# Patient Record
Sex: Female | Born: 1980
Health system: Southern US, Community
[De-identification: ages and names within clinical notes are randomized; demographics above are authoritative.]

## PROBLEM LIST (undated history)

## (undated) ENCOUNTER — Inpatient Hospital Stay (HOSPITAL_COMMUNITY): Payer: Self-pay

## (undated) DIAGNOSIS — I10 Essential (primary) hypertension: Secondary | ICD-10-CM

## (undated) DIAGNOSIS — R112 Nausea with vomiting, unspecified: Secondary | ICD-10-CM

## (undated) DIAGNOSIS — T7840XA Allergy, unspecified, initial encounter: Secondary | ICD-10-CM

## (undated) DIAGNOSIS — R519 Headache, unspecified: Secondary | ICD-10-CM

## (undated) DIAGNOSIS — E119 Type 2 diabetes mellitus without complications: Secondary | ICD-10-CM

## (undated) DIAGNOSIS — R87619 Unspecified abnormal cytological findings in specimens from cervix uteri: Secondary | ICD-10-CM

## (undated) DIAGNOSIS — R05 Cough: Secondary | ICD-10-CM

## (undated) DIAGNOSIS — G43909 Migraine, unspecified, not intractable, without status migrainosus: Secondary | ICD-10-CM

## (undated) DIAGNOSIS — R51 Headache: Principal | ICD-10-CM

## (undated) DIAGNOSIS — Z794 Long term (current) use of insulin: Secondary | ICD-10-CM

## (undated) DIAGNOSIS — E785 Hyperlipidemia, unspecified: Secondary | ICD-10-CM

## (undated) DIAGNOSIS — Z9889 Other specified postprocedural states: Secondary | ICD-10-CM

## (undated) DIAGNOSIS — N809 Endometriosis, unspecified: Secondary | ICD-10-CM

## (undated) DIAGNOSIS — Z8349 Family history of other endocrine, nutritional and metabolic diseases: Secondary | ICD-10-CM

## (undated) DIAGNOSIS — F419 Anxiety disorder, unspecified: Secondary | ICD-10-CM

## (undated) DIAGNOSIS — L301 Dyshidrosis [pompholyx]: Secondary | ICD-10-CM

## (undated) DIAGNOSIS — O139 Gestational [pregnancy-induced] hypertension without significant proteinuria, unspecified trimester: Secondary | ICD-10-CM

## (undated) DIAGNOSIS — IMO0002 Reserved for concepts with insufficient information to code with codable children: Secondary | ICD-10-CM

## (undated) HISTORY — PX: TUBAL LIGATION: SHX77

## (undated) HISTORY — PX: ENDOMETRIAL BIOPSY: SHX622

## (undated) HISTORY — PX: COLPOSCOPY: SHX161

## (undated) HISTORY — PX: CHOLECYSTECTOMY: SHX55

## (undated) HISTORY — PX: OOPHORECTOMY: SHX86

## (undated) HISTORY — DX: Type 2 diabetes mellitus without complications: E11.9

## (undated) HISTORY — PX: SALPINGECTOMY: SHX328

## (undated) HISTORY — PX: TYMPANOSTOMY TUBE PLACEMENT: SHX32

## (undated) HISTORY — DX: Headache: R51

## (undated) HISTORY — DX: Allergy, unspecified, initial encounter: T78.40XA

## (undated) HISTORY — DX: Family history of other endocrine, nutritional and metabolic diseases: Z83.49

## (undated) HISTORY — PX: TONSILLECTOMY: SHX5217

## (undated) HISTORY — PX: ABDOMINAL HYSTERECTOMY: SHX81

## (undated) HISTORY — DX: Hyperlipidemia, unspecified: E78.5

## (undated) HISTORY — PX: TONSILLECTOMY: SUR1361

## (undated) HISTORY — DX: Essential (primary) hypertension: I10

## (undated) HISTORY — DX: Headache, unspecified: R51.9

---

## 2004-04-12 ENCOUNTER — Emergency Department: Payer: Self-pay | Admitting: Emergency Medicine

## 2004-11-14 ENCOUNTER — Emergency Department: Payer: Self-pay | Admitting: Emergency Medicine

## 2004-12-15 ENCOUNTER — Ambulatory Visit: Payer: Self-pay | Admitting: Internal Medicine

## 2005-01-19 ENCOUNTER — Ambulatory Visit: Payer: Self-pay | Admitting: Internal Medicine

## 2005-05-22 ENCOUNTER — Ambulatory Visit: Payer: Self-pay | Admitting: Internal Medicine

## 2005-06-28 ENCOUNTER — Ambulatory Visit: Payer: Self-pay | Admitting: Internal Medicine

## 2005-08-01 ENCOUNTER — Emergency Department: Payer: Self-pay | Admitting: Emergency Medicine

## 2005-10-10 ENCOUNTER — Encounter (INDEPENDENT_AMBULATORY_CARE_PROVIDER_SITE_OTHER): Payer: Self-pay | Admitting: *Deleted

## 2005-10-10 ENCOUNTER — Other Ambulatory Visit: Admission: RE | Admit: 2005-10-10 | Discharge: 2005-10-10 | Payer: Self-pay | Admitting: Family Medicine

## 2005-10-10 ENCOUNTER — Ambulatory Visit: Payer: Self-pay | Admitting: Family Medicine

## 2005-10-27 ENCOUNTER — Ambulatory Visit: Payer: Self-pay | Admitting: Emergency Medicine

## 2005-11-22 ENCOUNTER — Ambulatory Visit: Payer: Self-pay | Admitting: Internal Medicine

## 2005-12-15 ENCOUNTER — Ambulatory Visit: Payer: Self-pay | Admitting: Internal Medicine

## 2006-02-21 ENCOUNTER — Ambulatory Visit: Payer: Self-pay | Admitting: Internal Medicine

## 2006-03-13 ENCOUNTER — Ambulatory Visit: Payer: Self-pay | Admitting: Professional

## 2006-04-17 DIAGNOSIS — O139 Gestational [pregnancy-induced] hypertension without significant proteinuria, unspecified trimester: Secondary | ICD-10-CM

## 2006-04-17 HISTORY — DX: Gestational (pregnancy-induced) hypertension without significant proteinuria, unspecified trimester: O13.9

## 2006-04-19 ENCOUNTER — Ambulatory Visit: Payer: Self-pay | Admitting: Family Medicine

## 2006-05-02 ENCOUNTER — Ambulatory Visit (HOSPITAL_COMMUNITY): Admission: RE | Admit: 2006-05-02 | Discharge: 2006-05-02 | Payer: Self-pay | Admitting: Family Medicine

## 2006-05-23 ENCOUNTER — Ambulatory Visit: Payer: Self-pay | Admitting: Obstetrics & Gynecology

## 2006-06-01 ENCOUNTER — Ambulatory Visit: Payer: Self-pay | Admitting: Gynecology

## 2006-06-06 ENCOUNTER — Ambulatory Visit (HOSPITAL_COMMUNITY): Admission: RE | Admit: 2006-06-06 | Discharge: 2006-06-06 | Payer: Self-pay | Admitting: Family Medicine

## 2006-06-25 ENCOUNTER — Ambulatory Visit: Payer: Self-pay | Admitting: Gynecology

## 2006-07-09 ENCOUNTER — Ambulatory Visit: Payer: Self-pay | Admitting: Gynecology

## 2006-07-12 ENCOUNTER — Ambulatory Visit (HOSPITAL_COMMUNITY): Admission: RE | Admit: 2006-07-12 | Discharge: 2006-07-12 | Payer: Self-pay | Admitting: Family Medicine

## 2006-07-24 ENCOUNTER — Ambulatory Visit: Payer: Self-pay | Admitting: Family Medicine

## 2006-07-25 ENCOUNTER — Ambulatory Visit (HOSPITAL_COMMUNITY): Admission: RE | Admit: 2006-07-25 | Discharge: 2006-07-25 | Payer: Self-pay | Admitting: Family Medicine

## 2006-08-13 ENCOUNTER — Ambulatory Visit: Payer: Self-pay | Admitting: Gynecology

## 2006-08-29 ENCOUNTER — Ambulatory Visit (HOSPITAL_COMMUNITY): Admission: RE | Admit: 2006-08-29 | Discharge: 2006-08-29 | Payer: Self-pay | Admitting: Family Medicine

## 2006-09-03 ENCOUNTER — Ambulatory Visit: Payer: Self-pay | Admitting: Gynecology

## 2006-09-26 ENCOUNTER — Ambulatory Visit (HOSPITAL_COMMUNITY): Admission: RE | Admit: 2006-09-26 | Discharge: 2006-09-26 | Payer: Self-pay | Admitting: Family Medicine

## 2006-09-27 ENCOUNTER — Ambulatory Visit: Payer: Self-pay | Admitting: Obstetrics & Gynecology

## 2006-10-01 ENCOUNTER — Ambulatory Visit: Payer: Self-pay | Admitting: Gynecology

## 2006-10-09 ENCOUNTER — Ambulatory Visit: Payer: Self-pay | Admitting: Family Medicine

## 2006-10-23 ENCOUNTER — Ambulatory Visit (HOSPITAL_COMMUNITY): Admission: RE | Admit: 2006-10-23 | Discharge: 2006-10-23 | Payer: Self-pay | Admitting: Family Medicine

## 2006-10-23 ENCOUNTER — Ambulatory Visit: Payer: Self-pay | Admitting: Family Medicine

## 2006-11-06 ENCOUNTER — Ambulatory Visit: Payer: Self-pay | Admitting: Family Medicine

## 2006-11-09 ENCOUNTER — Ambulatory Visit (HOSPITAL_COMMUNITY): Admission: RE | Admit: 2006-11-09 | Discharge: 2006-11-09 | Payer: Self-pay | Admitting: Family Medicine

## 2006-11-13 ENCOUNTER — Ambulatory Visit: Payer: Self-pay | Admitting: Family Medicine

## 2006-11-16 ENCOUNTER — Inpatient Hospital Stay (HOSPITAL_COMMUNITY): Admission: AD | Admit: 2006-11-16 | Discharge: 2006-11-16 | Payer: Self-pay | Admitting: Family Medicine

## 2006-11-16 ENCOUNTER — Ambulatory Visit: Payer: Self-pay | Admitting: Obstetrics and Gynecology

## 2006-11-19 ENCOUNTER — Ambulatory Visit: Payer: Self-pay | Admitting: Gynecology

## 2006-11-23 ENCOUNTER — Ambulatory Visit: Payer: Self-pay | Admitting: Gynecology

## 2006-11-26 ENCOUNTER — Ambulatory Visit: Payer: Self-pay | Admitting: Gynecology

## 2006-11-26 ENCOUNTER — Inpatient Hospital Stay (HOSPITAL_COMMUNITY): Admission: AD | Admit: 2006-11-26 | Discharge: 2006-11-28 | Payer: Self-pay | Admitting: Obstetrics & Gynecology

## 2006-11-30 ENCOUNTER — Ambulatory Visit: Payer: Self-pay | Admitting: Gynecology

## 2006-12-03 ENCOUNTER — Ambulatory Visit: Payer: Self-pay | Admitting: Gynecology

## 2006-12-07 ENCOUNTER — Ambulatory Visit: Payer: Self-pay | Admitting: Obstetrics and Gynecology

## 2006-12-10 ENCOUNTER — Ambulatory Visit: Payer: Self-pay | Admitting: Gynecology

## 2006-12-11 ENCOUNTER — Ambulatory Visit: Payer: Self-pay | Admitting: Family Medicine

## 2006-12-11 ENCOUNTER — Inpatient Hospital Stay (HOSPITAL_COMMUNITY): Admission: AD | Admit: 2006-12-11 | Discharge: 2006-12-17 | Payer: Self-pay | Admitting: Obstetrics & Gynecology

## 2006-12-12 ENCOUNTER — Encounter: Payer: Self-pay | Admitting: Obstetrics & Gynecology

## 2006-12-12 ENCOUNTER — Ambulatory Visit: Payer: Self-pay | Admitting: Obstetrics & Gynecology

## 2006-12-18 ENCOUNTER — Ambulatory Visit: Payer: Self-pay | Admitting: Obstetrics & Gynecology

## 2006-12-18 ENCOUNTER — Encounter: Admission: RE | Admit: 2006-12-18 | Discharge: 2007-01-16 | Payer: Self-pay | Admitting: Gynecology

## 2006-12-24 ENCOUNTER — Ambulatory Visit: Payer: Self-pay | Admitting: Obstetrics & Gynecology

## 2007-01-07 ENCOUNTER — Ambulatory Visit: Payer: Self-pay | Admitting: Gynecology

## 2007-01-07 ENCOUNTER — Encounter (INDEPENDENT_AMBULATORY_CARE_PROVIDER_SITE_OTHER): Payer: Self-pay | Admitting: Gynecology

## 2007-01-14 ENCOUNTER — Ambulatory Visit: Payer: Self-pay | Admitting: Gynecology

## 2007-01-17 ENCOUNTER — Encounter: Admission: RE | Admit: 2007-01-17 | Discharge: 2007-01-21 | Payer: Self-pay | Admitting: Gynecology

## 2007-02-05 ENCOUNTER — Ambulatory Visit: Payer: Self-pay | Admitting: Gynecology

## 2007-02-07 ENCOUNTER — Ambulatory Visit: Payer: Self-pay | Admitting: Obstetrics & Gynecology

## 2007-02-19 ENCOUNTER — Ambulatory Visit: Payer: Self-pay | Admitting: Internal Medicine

## 2007-02-19 DIAGNOSIS — E7849 Other hyperlipidemia: Secondary | ICD-10-CM | POA: Insufficient documentation

## 2007-02-19 DIAGNOSIS — E785 Hyperlipidemia, unspecified: Secondary | ICD-10-CM

## 2007-02-20 LAB — CONVERTED CEMR LAB
Albumin: 4.5 g/dL (ref 3.5–5.2)
BUN: 14 mg/dL (ref 6–23)
CO2: 25 meq/L (ref 19–32)
Calcium: 9.7 mg/dL (ref 8.4–10.5)
Chloride: 99 meq/L (ref 96–112)
Creatinine, Ser: 0.85 mg/dL (ref 0.40–1.20)
Glucose, Bld: 88 mg/dL (ref 70–99)
Phosphorus: 3.9 mg/dL (ref 2.3–4.6)
Potassium: 3.8 meq/L (ref 3.5–5.3)
Sodium: 137 meq/L (ref 135–145)

## 2007-02-28 ENCOUNTER — Telehealth (INDEPENDENT_AMBULATORY_CARE_PROVIDER_SITE_OTHER): Payer: Self-pay | Admitting: *Deleted

## 2007-03-05 ENCOUNTER — Telehealth: Payer: Self-pay | Admitting: Internal Medicine

## 2007-03-07 ENCOUNTER — Ambulatory Visit: Payer: Self-pay | Admitting: Family Medicine

## 2007-03-08 ENCOUNTER — Telehealth (INDEPENDENT_AMBULATORY_CARE_PROVIDER_SITE_OTHER): Payer: Self-pay | Admitting: *Deleted

## 2007-04-02 ENCOUNTER — Ambulatory Visit: Payer: Self-pay | Admitting: Internal Medicine

## 2007-06-28 ENCOUNTER — Ambulatory Visit: Payer: Self-pay | Admitting: Internal Medicine

## 2007-07-18 ENCOUNTER — Ambulatory Visit: Payer: Self-pay | Admitting: Family Medicine

## 2007-07-18 DIAGNOSIS — J309 Allergic rhinitis, unspecified: Secondary | ICD-10-CM | POA: Insufficient documentation

## 2007-08-27 ENCOUNTER — Ambulatory Visit: Payer: Self-pay | Admitting: Family Medicine

## 2007-08-27 ENCOUNTER — Encounter: Payer: Self-pay | Admitting: Family Medicine

## 2007-09-03 ENCOUNTER — Ambulatory Visit: Payer: Self-pay | Admitting: Family Medicine

## 2007-09-24 ENCOUNTER — Ambulatory Visit: Payer: Self-pay | Admitting: Family Medicine

## 2007-11-10 ENCOUNTER — Emergency Department: Payer: Self-pay | Admitting: Emergency Medicine

## 2007-12-11 ENCOUNTER — Ambulatory Visit: Payer: Self-pay | Admitting: Family Medicine

## 2007-12-20 ENCOUNTER — Ambulatory Visit: Payer: Self-pay | Admitting: Internal Medicine

## 2007-12-20 DIAGNOSIS — G4700 Insomnia, unspecified: Secondary | ICD-10-CM | POA: Insufficient documentation

## 2007-12-24 LAB — CONVERTED CEMR LAB
Albumin: 4.4 g/dL (ref 3.5–5.2)
BUN: 15 mg/dL (ref 6–23)
Basophils Absolute: 0 10*3/uL (ref 0.0–0.1)
Basophils Relative: 0 % (ref 0–1)
CO2: 27 meq/L (ref 19–32)
Calcium: 9.9 mg/dL (ref 8.4–10.5)
Chloride: 99 meq/L (ref 96–112)
Creatinine, Ser: 0.76 mg/dL (ref 0.40–1.20)
Eosinophils Absolute: 0.1 10*3/uL (ref 0.0–0.7)
Eosinophils Relative: 1 % (ref 0–5)
Glucose, Bld: 79 mg/dL (ref 70–99)
HCT: 36.8 % (ref 36.0–46.0)
Hemoglobin: 12.5 g/dL (ref 12.0–15.0)
Lymphocytes Relative: 37 % (ref 12–46)
Lymphs Abs: 2.5 10*3/uL (ref 0.7–4.0)
MCHC: 34 g/dL (ref 30.0–36.0)
MCV: 90.2 fL (ref 78.0–100.0)
Monocytes Absolute: 0.5 10*3/uL (ref 0.1–1.0)
Monocytes Relative: 8 % (ref 3–12)
Neutro Abs: 3.7 10*3/uL (ref 1.7–7.7)
Neutrophils Relative %: 54 % (ref 43–77)
Phosphorus: 3.6 mg/dL (ref 2.3–4.6)
Platelets: 429 10*3/uL — ABNORMAL HIGH (ref 150–400)
Potassium: 4.1 meq/L (ref 3.5–5.3)
RBC: 4.08 M/uL (ref 3.87–5.11)
RDW: 12.5 % (ref 11.5–15.5)
Sodium: 138 meq/L (ref 135–145)
TSH: 1.135 microintl units/mL (ref 0.350–4.50)
WBC: 6.8 10*3/uL (ref 4.0–10.5)

## 2008-02-10 ENCOUNTER — Telehealth: Payer: Self-pay | Admitting: Internal Medicine

## 2008-05-01 ENCOUNTER — Other Ambulatory Visit: Admission: RE | Admit: 2008-05-01 | Discharge: 2008-05-01 | Payer: Self-pay | Admitting: Obstetrics & Gynecology

## 2008-07-07 ENCOUNTER — Emergency Department: Payer: Self-pay | Admitting: Emergency Medicine

## 2008-07-09 ENCOUNTER — Ambulatory Visit: Payer: Self-pay | Admitting: Family Medicine

## 2008-07-15 ENCOUNTER — Ambulatory Visit: Payer: Self-pay | Admitting: General Practice

## 2008-07-30 ENCOUNTER — Ambulatory Visit: Payer: Self-pay | Admitting: Obstetrics & Gynecology

## 2009-04-30 ENCOUNTER — Encounter: Admission: RE | Admit: 2009-04-30 | Discharge: 2009-04-30 | Payer: Self-pay | Admitting: Podiatry

## 2009-05-12 ENCOUNTER — Telehealth: Payer: Self-pay | Admitting: Internal Medicine

## 2009-10-20 ENCOUNTER — Ambulatory Visit: Payer: Self-pay | Admitting: Family Medicine

## 2009-11-11 ENCOUNTER — Ambulatory Visit: Payer: Self-pay | Admitting: Obstetrics and Gynecology

## 2010-04-05 ENCOUNTER — Ambulatory Visit: Payer: Self-pay | Admitting: Internal Medicine

## 2010-04-17 HISTORY — PX: LAPAROSCOPIC ASSISTED VAGINAL HYSTERECTOMY: SHX5398
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
## 2010-05-17 NOTE — Progress Notes (Signed)
Summary: Trazodone not working  Phone Note Call from Patient Call back at [phone]   Caller: Patient Call For: Dr. Alphonsus Sias Reason for Call: Lab or Test Results Summary of Call: Pt is calling to let you know that Trazodone is not working to help her sleep.  Pt also has a sore throat, body aches, no fever and wants to know what you suggest for this?  Pharmacy - [facility].  Pt had the swine flu shot last week after her throat became sore. Initial call taken by: Sydell Axon,  February 10, 2008 2:58 PM  Follow-up for Phone Call        trazodone does't kick in for hours Only uses when her shift changes every 2 weeks will try ambien discussed dependence--should be okay if only uses occ  mild cold symptoms back discomfort discussed symptomatic Rx Follow-up by: Cindee Salt MD,  February 10, 2008 5:55 PM    New/Updated Medications: ZOLPIDEM TARTRATE 5 MG TABS (ZOLPIDEM TARTRATE) 1-2 at bedtime as needed for insomnia   Prescriptions: ZOLPIDEM TARTRATE 5 MG TABS (ZOLPIDEM TARTRATE) 1-2 at bedtime as needed for insomnia  #60 x 0   Entered and Authorized by:   Cindee Salt MD   Signed by:   Cindee Salt MD on 02/10/2008   Method used:   Telephoned to ...         RxID:   [id number]

## 2010-05-17 NOTE — Assessment & Plan Note (Signed)
Summary: ST,COUGH/CLE   Vital Signs:  Patient Profile:   30 Years Old Female Weight:      208 pounds Temp:     98.6 degrees F oral Pulse rate:   99 / minute BP sitting:   132 / 96  (left arm) Cuff size:   large  Vitals Entered By: Cooper Render (March 07, 2007 10:32 AM)                 Chief Complaint:  URI sx.  History of Present Illness: Here fosr URI sx, onset x2d with ST, some cough, no nasal congestion, chilols/no fever.  Taking Dayquil/Nyquil--slept.  Hurts to swallow--full  diet.  IN class first of the wk, goes back to 12h shift tonight.  Has a 2 and 1/2 mo old daughter.  Current Allergies (reviewed today): ! * CEFZIL ! * Z PAK Updated/Current Medications (including changes made in today's visit):  MAXZIDE 75-50 MG  TABS (TRIAMTERENE-HCTZ) Take 1 tablet by mouth once a day ATENOLOL 25 MG  TABS (ATENOLOL) Take 1 tablet by mouth once a day * MIRENA       Review of Systems      See HPI   Physical Exam  General:     alert, well-developed, and well-nourished.  NAD Head:     normocephalic, atraumatic, and no abnormalities observed.   Eyes:     pupils equal, pupils round, and no injection.   Ears:     TMs retracted bilat Nose:     eryth/edema, sinuses not tender Mouth:     injected with no exudate Lungs:     moist cough only, normal respiratory effort, no crackles, and no wheezes.   Neurologic:     alert & oriented X3 and gait normal.   Skin:     turgor normal, color normal, and no rashes.   Cervical Nodes:     shotty Psych:     normally interactive.      Impression & Recommendations:  Problem # 1:  URI (ICD-465.9) Assessment: New comfort care measures: increase by mouth fluids, tylenol/IBP pern, rest discussed hygeine, handwashing see back or call if worsens next 5-7d  Complete Medication List: 1)  Maxzide 75-50 Mg Tabs (Triamterene-hctz) .... Take 1 tablet by mouth once a day 2)  Atenolol 25 Mg Tabs (Atenolol) .... Take 1 tablet by  mouth once a day 3)  Mirena      ]

## 2010-05-17 NOTE — Assessment & Plan Note (Signed)
Summary: COUGH/FEVER/HEA   Vital Signs:  Patient Profile:   30 Years Old Female Weight:      210.25 pounds Temp:     97.9 degrees F oral Pulse rate:   80 / minute BP sitting:   130 / 82  (left arm)  Vitals Entered By: Wandra Mannan (April 02, 2007 11:25 AM)                 Chief Complaint:  cough and sinus ddrainage.  History of Present Illness: Sick about 3 weeks ago with sore throat and cough This resolved after a couple of weeks Seemed to be better for about 1 &1/2 weeks but now has recurred did seem to be well inbetween  Bad cough, affecting her voice Started again about 3 days ago generally dry No sig rhinnorhea or drainage but nose is stuffy Throat is sore again Has pressure in her ears No fever, chills some sweats and dizziness  Tried nyquil  Infant daughter watched by Mom--not sick  Current Allergies (reviewed today): ! * CEFZIL ! * Z PAK  Past Medical History:    Reviewed history from 02/19/2007 and no changes required:       Hypertension--2003       Hyperlipidemia  Past Surgical History:    Reviewed history from 02/19/2007 and no changes required:       Ear tubes as child       Tonsillectomy   Social History:    Reviewed history from 02/19/2007 and no changes required:       Returned to work as Scientist, research (life sciences)       Single       1 daughter       Never Smoked       Alcohol use-occ     Physical Exam  General:     alert.  NAD Head:     no sinus tenderness Ears:     R ear normal and L ear normal.   Nose:     mild congestion Mouth:     minimal pharyngeal injection Neck:     supple and no cervical lymphadenopathy.   Lungs:     normal respiratory effort and normal breath sounds.      Impression & Recommendations:  Problem # 1:  URI (ICD-465.9) Assessment: Comment Only seems to have new infection Clearly viral She will call next week if signs of a secondary bacterial infection (discussed this) hycodan to help sleep  and analgesics  The following medications were removed from the medication list:    Tussionex Pennkinetic Er 8-10 Mg/42ml Lqcr (Chlorpheniramine-hydrocodone) .Marland Kitchen... 1 tsp at bedtime as needed cough  Her updated medication list for this problem includes:    Hycodan 5-1.5 Mg/10ml Syrp (Hydrocodone-homatropine) .Marland Kitchen... 1-2 teaspoons at bedtime as needed for severe cough   Complete Medication List: 1)  Maxzide 75-50 Mg Tabs (Triamterene-hctz) .... Take 1 tablet by mouth once a day 2)  Atenolol 25 Mg Tabs (Atenolol) .... Take 1 tablet by mouth once a day 3)  Mirena  4)  Hycodan 5-1.5 Mg/77ml Syrp (Hydrocodone-homatropine) .Marland Kitchen.. 1-2 teaspoons at bedtime as needed for severe cough   Patient Instructions: 1)  Please schedule a follow-up appointment as needed. 2)  Take 650-1000mg  of Tylenol every 4-6 hours as needed for relief of pain or comfort of fever AVOID taking more than 4000mg   in a 24 hour period (can cause liver damage in higher doses). 3)  Take 400-600mg  of Ibuprofen (Advil, Motrin) with food  every 4-6 hours as needed for relief of pain or comfort of fever.    Prescriptions: HYCODAN 5-1.5 MG/5ML  SYRP (HYDROCODONE-HOMATROPINE) 1-2 teaspoons at bedtime as needed for severe cough  #4 oz x 0   Entered and Authorized by:   Cindee Salt MD   Signed by:   Cindee Salt MD on 04/02/2007   Method used:   Print then Give to Patient   RxID:   [id number]  ] Current Allergies (reviewed today): ! * CEFZIL ! * Z PAK Current Medications (including changes made in today's visit):  MAXZIDE 75-50 MG  TABS (TRIAMTERENE-HCTZ) Take 1 tablet by mouth once a day ATENOLOL 25 MG  TABS (ATENOLOL) Take 1 tablet by mouth once a day * MIRENA  HYCODAN 5-1.5 MG/5ML  SYRP (HYDROCODONE-HOMATROPINE) 1-2 teaspoons at bedtime as needed for severe cough

## 2010-05-17 NOTE — Assessment & Plan Note (Signed)
Summary: sinus infection/dlo   Vital Signs:  Patient Profile:   30 Years Old Female Weight:      216 pounds Temp:     98.3 degrees F oral Pulse rate:   98 / minute BP sitting:   126 / 91  (left arm) Cuff size:   large  Vitals Entered By: Cooper Render (July 18, 2007 2:29 PM)                 Chief Complaint:  ? sinus infection.  History of Present Illness: Here for URI--ears full, nasal congestion, post nasal drainage, no fever/chills.  Taking nyquil and dayquil--help some.   Daughter 13mo--teething and URI    Current Allergies (reviewed today): ! * CEFZIL ! * Z PAK     Review of Systems      See HPI   Physical Exam  General:     alert, well-developed, well-nourished, and well-hydrated.  NAD Eyes:     pupils equal and pupils round.   Ears:     TMs retracted with increased fluid bilat Nose:     mucosal erythema and boggy .  sinuses +,- Mouth:     no exudates and pharyngeal erythema.   Lungs:     moist cough only, no crackles and no wheezes.   Neurologic:     alert & oriented X3, sensation intact to light touch, and gait normal.   Skin:     turgor normal, color normal, and no rashes.   Cervical Nodes:     shotty tonsilar nodes, no anterior cervical adenopathy and no posterior cervical adenopathy.   Psych:     normally interactive and good eye contact.      Impression & Recommendations:  Problem # 1:  ALLERGIC RHINITIS (ICD-477.9) Assessment: New will start Nasacort nasal spray 2 once daily--with demo see back if not improved Her updated medication list for this problem includes:    Clarinex 5 Mg Tabs (Desloratadine) .Marland Kitchen... 1 once daily for congestion by mouth    Nasacort Aq 55 Mcg/act Aers (Triamcinolone acetonide(nasal)) .Marland Kitchen... 1-2 sprays each nostril daily   Problem # 2:  URI (ICD-465.9) Assessment: New continue comfort care measures: increase po fluids, rest, tylenol or IBP as needed start clarines see back if not improved 1 wk Her updated  medication list for this problem includes:    Clarinex 5 Mg Tabs (Desloratadine) .Marland Kitchen... 1 once daily for congestion by mouth   Complete Medication List: 1)  Maxzide 75-50 Mg Tabs (Triamterene-hctz) .... Take 1 tablet by mouth once a day 2)  Atenolol 25 Mg Tabs (Atenolol) .... Take 1 tablet by mouth once a day 3)  Mirena  4)  Clarinex 5 Mg Tabs (Desloratadine) .Marland Kitchen.. 1 once daily for congestion by mouth 5)  Nasacort Aq 55 Mcg/act Aers (Triamcinolone acetonide(nasal)) .Marland Kitchen.. 1-2 sprays each nostril daily     Prescriptions: CLARINEX 5 MG  TABS (DESLORATADINE) 1 once daily for congestion by mouth Brand medically necessary #5 x 0   Entered and Authorized by:   Gildardo Griffes FNP   Signed by:   Gildardo Griffes FNP on 07/18/2007   Method used:   Print then Give to Patient   RxID:   [id number]  ] Prior Medications (reviewed today): MAXZIDE 75-50 MG  TABS (TRIAMTERENE-HCTZ) Take 1 tablet by mouth once a day ATENOLOL 25 MG  TABS (ATENOLOL) Take 1 tablet by mouth once a day MIRENA ()  Current Allergies (reviewed today): ! * CEFZIL ! *  Z PAK

## 2010-05-17 NOTE — Progress Notes (Signed)
Summary: cough  Phone Note Call from Patient Call back at [phone]   Caller: Patient Call For: letvak/bean Summary of Call: pt seen for s/t she is requesting rx for cough, she started coughing last night and could hardly sleep, cough is making her s/t worse. Initial call taken by: Liane Comber,  March 08, 2007 9:22 AM  Follow-up for Phone Call        Rx for tussinex to your desk  ..................................................................Marland KitchenGildardo Griffes FNP  March 08, 2007 12:54 PM   Additional Follow-up for Phone Call Additional follow up Details #1::        Advised patient.  ......................................................Marland KitchenLiane Comber March 08, 2007 1:53 PM Rx called to pharmacy. ................................................Marland KitchenLiane Comber March 08, 2007 1:53 PM     New/Updated Medications: [name] ER 8-10 MG/5ML  LQCR (CHLORPHENIRAMINE-HYDROCODONE) 1 tsp at bedtime as needed cough [BMN]   Prescriptions: TUSSIONEX PENNKINETIC ER 8-10 MG/5ML  LQCR (CHLORPHENIRAMINE-HYDROCODONE) 1 tsp at bedtime as needed cough Brand medically necessary #120 mg x 0   Entered and Authorized by:   Gildardo Griffes FNP   Signed by:   Gildardo Griffes FNP on 03/08/2007   Method used:   Print then Give to Patient   RxID:   [id number]

## 2010-05-17 NOTE — Progress Notes (Signed)
Summary: release to work?  Phone Note Call from Patient Call back at [phone]   Caller: Patient Call For: letvak Summary of Call: pt had a baby 3 months ago and was taken out of work during pregnancy from pre eclampsia, her ob dr had her on light duty when she returned due to her bp, at her last visit her bp was ok her employer is wanting to know when she can be released from light duty. Initial call taken by: Liane Comber,  February 28, 2007 10:12 AM  Follow-up for Phone Call        she can resume full activities as of now prepare note if needed please Follow-up by: Cindee Salt MD,  February 28, 2007 10:44 AM  Additional Follow-up for Phone Call Additional follow up Details #1::        notw written as above, stamped and faxed to her ..................................................................Marland KitchenLiane Comber  February 28, 2007 11:40 AM

## 2010-05-17 NOTE — Assessment & Plan Note (Signed)
Summary: F/U/HEA   Vital Signs:  Patient Profile:   30 Years Old Female Weight:      216.38 pounds Temp:     97.8 degrees F oral Pulse rate:   80 / minute BP sitting:   134 / 88  (left arm)  Vitals Entered By: Wandra Mannan (June 28, 2007 11:10 AM)                 Chief Complaint:  follow up.  History of Present Illness: Doing okay with BP meds Doesn't check BP outside of office No headaches no vision problems No apparent problems with the medication Did have BP problems even before her pregnancy   Serial Vital Signs/Assessments:  Time      Position  BP       Pulse  Resp  Temp     By           R Arm     134/86                         Cindee Salt MD    Current Allergies (reviewed today): ! * CEFZIL ! * Z PAK  Past Medical History:    Reviewed history from 02/19/2007 and no changes required:       Hypertension--2003       Hyperlipidemia  Past Surgical History:    Reviewed history from 02/19/2007 and no changes required:       Ear tubes as child       Tonsillectomy   Social History:    Reviewed history from 02/19/2007 and no changes required:       Returned to work as Scientist, research (life sciences)       Single       1 daughter       Never Smoked       Alcohol use-occ    Review of Systems  The patient denies chest pain, syncope, and dyspnea on exhertion.         sleep is variable--relates to shift work for [employer] office--changes shift every 2 weeks   Physical Exam  General:     alert and normal appearance.   Neck:     supple, no masses, no thyromegaly, no carotid bruits, and no cervical lymphadenopathy.   Lungs:     normal respiratory effort and normal breath sounds.   Heart:     normal rate, regular rhythm, no murmur, and no gallop.   Pulses:     normal in feet Extremities:     no edema or calf tenderness Psych:     normally interactive, good eye contact, not anxious appearing, and not depressed appearing.      Impression &  Recommendations:  Problem # 1:  HYPERTENSION (ICD-401.9) Assessment: Unchanged acceptable control Needs to stay on meds  Her updated medication list for this problem includes:    Maxzide 75-50 Mg Tabs (Triamterene-hctz) .Marland Kitchen... Take 1 tablet by mouth once a day    Atenolol 25 Mg Tabs (Atenolol) .Marland Kitchen... Take 1 tablet by mouth once a day   Complete Medication List: 1)  Maxzide 75-50 Mg Tabs (Triamterene-hctz) .... Take 1 tablet by mouth once a day 2)  Atenolol 25 Mg Tabs (Atenolol) .... Take 1 tablet by mouth once a day 3)  Mirena    Patient Instructions: 1)  Please schedule a follow-up appointment in 6 months.    ] Current Allergies (reviewed today): ! * CEFZIL ! *  Z PAK Current Medications (including changes made in today's visit):  MAXZIDE 75-50 MG  TABS (TRIAMTERENE-HCTZ) Take 1 tablet by mouth once a day ATENOLOL 25 MG  TABS (ATENOLOL) Take 1 tablet by mouth once a day * MIRENA

## 2010-05-17 NOTE — Progress Notes (Signed)
Summary: Rx Atenolol  Phone Note Refill Request Call back at [phone] Message from:  [facility] on May 12, 2009 10:46 AM  Refills Requested: Medication #1:  ATENOLOL 25 MG  TABS Take 1 tablet by mouth once a day   Last Refilled: 02/02/2009 Received faxed refill request, patient hasn't been seen since 2009 and no appt scheduled.    Method Requested: Electronic Initial call taken by: Linde Gillis CMA [facility]),  May 12, 2009 10:47 AM  Follow-up for Phone Call        Called patient and left message on machine to return my call. DeShannon Katrinka Blazing CMA [facility]ull)  May 12, 2009 12:11 PM     Prescriptions: ATENOLOL 25 MG  TABS (ATENOLOL) Take 1 tablet by mouth once a day  #30 x 6   Entered by:   Mervin Hack CMA (AAMA)   Authorized by:   Cindee Salt MD   Signed by:   Mervin Hack CMA (AAMA) on 05/12/2009   Method used:   Electronically to        [facility]* (retail)       [address] [place], [place]  [address]       Ph: [phone]       Fax: [phone]   RxID:   [id number]
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
## 2010-05-19 NOTE — Assessment & Plan Note (Signed)
Summary: SINUS SYMPTOMS- 12:45/ lb   Vital Signs:  Patient profile:   30 year old female Height:      66 inches Weight:      198 pounds BMI:     32.07 Temp:     98.0 degrees F oral Pulse rate:   71 / minute Pulse rhythm:   regular BP sitting:   145 / 104  (left arm) Cuff size:   large  Vitals Entered By: Mervin Hack CMA Duncan Dull) (April 05, 2010 12:58 PM)   History of Present Illness: Got sick a couple of weeks ago Seemed to run its course then it went to her boyfriend and now she has it back  suddenly hit her hard again 3 days ago Headache, nasal congestion, frontal and maxillary pain teeth pain as well some sore throat and hoarseness  No fever SOme SOB due to congestion No rash  Tried OTC cold med and nasal spray---didn't really help  Allergies: 1)  ! Zithromax (Azithromycin) 2)  ! Bactrim (Sulfamethoxazole-Trimethoprim) 3)  ! Ceftin (Cefuroxime Axetil)  Past History:  Past medical, surgical, family and social histories (including risk factors) reviewed for relevance to current acute and chronic problems.  Past Medical History: Reviewed history from 02/19/2007 and no changes required. Hypertension--2003 Hyperlipidemia  Past Surgical History: Reviewed history from 02/19/2007 and no changes required. Ear tubes as child Tonsillectomy  Family History: Reviewed history from 02/19/2007 and no changes required. Dad with HTN, hypercholesterolemia Mom with hemochromatosis Dennie Bible GF--some cancer 2 brothers CAD in pat uncle HTN strong on Dad's side No DM  Social History: Reviewed history from 12/20/2007 and no changes required. Returned to work as Scientist, research (life sciences) Single 1 daughter Never Smoked Alcohol use-occ In a relationship again-doing well  Review of Systems       lost 20# since 2 years ago gets BP meds now from Dr Dorothey Baseman through the city No vomiting or diarrhea---slight loose stool though appetite is okay  Physical Exam  General:   alert.  NAD Head:  mild maxillary tenderness Ears:  R ear normal.   Mild injection of left TM Nose:  moderate swelling and inflammation Mouth:  no erythema and no exudates.   Neck:  supple, no masses, and no cervical lymphadenopathy.   Lungs:  normal respiratory effort, no intercostal retractions, no accessory muscle use, and normal breath sounds.     Impression & Recommendations:  Problem # 1:  SINUSITIS - ACUTE-NOS (ICD-461.9) Assessment New really seems to have secondary bacterial infection after initial viral discussed analgesics, vicks amoxil for the infection  Complete Medication List: 1)  Maxzide 75-50 Mg Tabs (Triamterene-hctz) .... Take 1 tablet by mouth once a day 2)  Atenolol 25 Mg Tabs (Atenolol) .... Take 1 tablet by mouth once a day 3)  Amoxicillin 500 Mg Tabs (Amoxicillin) .... 2 tabs by mouth two times a day for sinus infection  Patient Instructions: 1)  Please schedule a follow-up appointment as needed .  Prescriptions: AMOXICILLIN 500 MG TABS (AMOXICILLIN) 2 tabs by mouth two times a day for sinus infection  #40 x 0   Entered and Authorized by:   Cindee Salt MD   Signed by:   Cindee Salt MD on 04/05/2010   Method used:   Electronically to        [facility]  [address] (retail)       [address]       [place]  [place], [place]  [address]       Ph: [phone]       Fax: [phone]   RxID:   [id number]    Orders Added: 1)  Est. Patient Level III [55732]    Current Allergies (reviewed today): ! ZITHROMAX (AZITHROMYCIN) ! BACTRIM (SULFAMETHOXAZOLE-TRIMETHOPRIM) ! CEFTIN (CEFUROXIME AXETIL)

## 2010-08-30 NOTE — Assessment & Plan Note (Signed)
NAMESHARLON, [name]              ACCOUNT NO.:  [id number]   MEDICAL RECORD NO.:  [id number]          PATIENT TYPE:  POB   LOCATION:  [facility] at [facility]         FACILITY:  [facility]   PHYSICIAN:  Tinnie Gens, MD        DATE OF BIRTH:  1981/03/20   DATE OF SERVICE:  09/24/2007                                  CLINIC NOTE   CHIEF COMPLAINT:  Pelvic pain, left side.   HISTORY OF PRESENT ILLNESS:  The patient is a 30 year old, para 1 who  has had left-sided pelvic pain since early May.  She  had undergone  pelvic sonography, which has been normal.  She has had urine cultures,  which are also negative; GC and chlamydia testing, which was negative;  and Pap smear, which was normal.  I cannot find a good reason for her  chronic pelvic pain.  She does desire continued birth control, but given  her blood pressure, we are trying to be careful with that.  She also  reports not being very good at taking it.  She has tried the NuvaRing in  the past that has fallen out continuously, so she is not interested in  that.   PHYSICAL EXAMINATION:  VITAL SIGNS:  As noted in the chart.  Her blood  pressure is 111/83 today.  GENERAL:  She is a well-developed, well-nourished female, in no acute  distress.  ABDOMEN:  Soft, nontender, and nondistended.  GU:  Normal external female genitalia.  Vagina is pink and rugated.  Cervix is parous without lesion.  IUD strings are easily visualized.  IUD is removed without difficulty.  The patient tolerated the procedure  well.   IMPRESSION:  Pelvic pain, left-sided, question related to intrauterine  device.   PLAN:  We will start Yasmin; she is not on an ACE so  this should be  safe for her, and  will havelittle impact on her blood pressure.  Return  in 4 weeks for blood pressure check and see how she is doing.  She can  throw away the first 2 pills of this pack and use condoms.           ______________________________  Tinnie Gens, MD     TP/MEDQ  D:   09/24/2007  T:  09/25/2007  Job:  [id number]

## 2010-08-30 NOTE — Op Note (Signed)
NAMETONIMARIE, GRITZ              ACCOUNT NO.:  [id number]   MEDICAL RECORD NO.:  [id number]           PATIENT TYPE:   LOCATION:                                FACILITY:  [facility]   PHYSICIAN:  Lazaro Arms, M.D.   DATE OF BIRTH:  12-30-1980   DATE OF PROCEDURE:  12/12/2006  DATE OF DISCHARGE:                               OPERATIVE REPORT   PREOPERATIVE DIAGNOSES:  1. Intrauterine pregnancy at 73 and 5/7 weeks' gestation.  2. Mild preeclampsia.  3. Secondary arrest of descent in the second stage of labor.  4. Right occiput posterior presentation.   POSTOPERATIVE DIAGNOSES:  1. Intrauterine pregnancy at 57 and 5/7 weeks' gestation.  2. Mild preeclampsia.  3. Secondary arrest of descent in the second stage of labor.  4. Right occiput posterior presentation.  5. Asynclitism.   PROCEDURE:  Primary cesarean section.   SURGEON:  Lazaro Arms, MD   ANESTHESIA:  Epidural.   FINDINGS:  [name] is a 30 year old lady who is diagnosed with mild  preeclampsia.  She has undergone magnesium seizure prophylaxis during  labor.  She has been, prior to making a decision to go for C-section,  complete for approximately 3 hours with no progress, both with pushing  and with rest periods.  On exam, I felt like the baby was in a right  occiput posterior presentation.  There was a lot of molding and caput  formation, and I was not sure.  At the time of surgery, it was found to  be  ROP and asynclitic.  Over a low transverse hysterotomy incision she  delivered a viable female infant at 17 with Apgars of  9 and 9  weighing 6 pounds 15 ounces.  Cord gas  7.37.  Three-vessel cord.  Cord  blood was sent as well.  The uterus was firm initially but then became  boggy because of the magnesium.  Her blood pressure is under good  control, and so I gave her Methergine IM for her bleeding.  The uterus,  tubes and ovaries are otherwise normal.   DESCRIPTION OF OPERATION:  The patient was taken to the  operating room  after having had her epidural dosed up for adequate anesthesia for a  cesarean section.  The patient was prepped and draped in the usual  sterile fashion.  She already had a catheter in place.  Pfannenstiel  skin incision was made and carried down sharply to the rectus fascia  which was scored in the midline and extended laterally.  The fascia was  taken off the muscles superiorly and inferiorly without difficulty.  The  muscles were divided.  The peritoneal cavity was entered.  An Alexis  self-retaining wound retractor was placed.  Bladder blade was placed.  Vesicouterine serosa flaps created.  Low transverse hysterotomy incision  was made.  Over this incision was delivered a viable female at 40 with  Apgars of 9 and 9, weighing 6 pounds 15 ounces.  It was ROP and  asynclitic.  The cord gas was 7.37.  Cord blood was also sent.  The  placenta was  delivered spontaneously.  The uterus was firm initially but  then became boggy, and she was given Methergine IM at this point which  slowed down her bleeding and blood pressure maintained without any  problem.  The uterus was closed in 2 layers, the first being a running  interlocking layer and the second being imbricating layer.  The  peritoneal cavity was irrigated vigorously, and there was good  hemostasis of all pedicles.  The Alexis self-retaining wound retractor  was removed.  The muscles and peritoneum were reapproximated loosely.  The fascia was closed using #0 Vicryl running.  The subcutaneous tissue  was reapproximated using 2-0 plain, and the skin was closed using 4-0  Vicryl on a Keith needle in subcuticular fashion.  Dermabond was placed  for additional wound integrity and as a bandage.  The patient tolerated  the procedure well.  She experienced 500 cc of blood loss.  She was  taken to the recovery room in good stable condition.  All counts  correct.  She received Cleocin prophylactically because of her allergy  to  Cefzil.      Lazaro Arms, M.D.  Electronically Signed     LHE/MEDQ  D:  12/13/2006  T:  12/14/2006  Job:  [id number]

## 2010-08-30 NOTE — Discharge Summary (Signed)
[name], [name]              ACCOUNT NO.:  [id number]   MEDICAL RECORD NO.:  [id number]          PATIENT TYPE:  INP   LOCATION:                                FACILITY:  [facility]   PHYSICIAN:  Tanya S. Shawnie Pons, M.D.   DATE OF BIRTH:  1980-05-14   DATE OF ADMISSION:  12/11/2006  DATE OF DISCHARGE:  12/17/2006                               DISCHARGE SUMMARY   ADMISSION DIAGNOSES:  79. A 30 year old gravida 1, para 0 at 37 weeks 4 days gestation.  2. Chronic hypertension.  3. Elevated blood pressures and symptoms of preeclampsia.  4. Group B strep status negative.   DISCHARGE DIAGNOSES:  46. A 30 year old gravida 1, para 1-0-0-1, postoperative day #5, status      post primary low transverse Cesarean section for arrest of descent      and persistent right occiput posterior presentation.  2. Chronic hypertension.  3. Endometritis status post antibiotic treatment with clindamycin and      gentamicin.  4. Group B strep status negative.   COMPLICATIONS:  The patient did have post-partum endometritis treated  with antibiotics. Otherwise, no complications.   CONSULTATIONS:  None.   PROCEDURES:  Primary low transverse Cesarean section performed by Dr.  Duane Lope on December 12, 2006 secondary to failure of descent and  persistent occiput posterior presentation.   LABORATORY VALUES:  Upon admission, the patient's white blood cell count  was 7.4, hemoglobin was 9.9, platelets were 310. Liver enzymes were  noted to be:  AST 17, ALT 11.  LDH 105. Uric acid 3.8. CBC was followed  on the day after delivery with hemoglobin of 8 and platelets of 252.  Liver enzymes were still within the normal range with AST of 17, ALT of  9. LDH was 199. CBC was followed the following day, and hemoglobin was  stable at 7.8, platelets were 243.   ADMISSION HISTORY:  [name] is a 30 year old gravida 1, para 0 that  presented at 37 weeks 4 days gestation for induction of labor secondary  to elevated blood  pressures, a half pound of weight gain in 12 hours,  headaches and elevated blood pressures. Her blood pressure was noted to  be 160/100 in the clinic. She does have chronic hypertension and was  being managed with labetalol 400 mg twice daily, but her blood pressures  had continued to increase, making this suspicious for chronic  hypertension with superimposed preeclampsia. The patient was admitted  for induction of labor with Cytotec and management of preeclampsia with  magnesium sulfate.   HOSPITAL COURSE:  The patient was admitted on December 11, 2006 for  induction of labor. Induction of labor was begun with Cytotec, and  magnesium was started for preeclampsia. Her admission blood pressure was  noted to be 157/99. Cytotec induction was continued over the night of  the 26th and the 27th. The patient did have a Foley bulb placed on  August 27 at 0300 for mechanical induction of labor. On the morning of  August 27, the patient had progressed to 3 to 4 cm, and her membranes  were broken with clear fluid, and she was started on Pitocin. The  patient progressed to complete and remained there for three hours with  periods of pushing and resting. She was felt to be occiput posterior and  asynclitic on examination. After arrest of descent for 3 hours, it was  decided patient would be taken to the operating room and a primary low  transverse Cesarean section performed. On December 12, 2006, Dr. Duane Lope performed a primary low transverse Cesarean section for failure to  descend and persistent occiput posterior position. The patient tolerated  the procedure well and went to the antenatal intensive care unit  postoperatively. The patient remained on magnesium for 24 hours, and  after having good urine output, her magnesium and Foley were  discontinued on the 28th. The patient's blood pressures continued to be  high, and she was continued on labetalol 200 mg p.o. b.i.d. She was  noted to have  some uterine tenderness and subjective chills, but she  never had any fever. She was started on gentamicin and clindamycin to  treat endometritis for which she got two doses. She responded well to  antibiotics, and her uterine tenderness subsided. She continued to have  elevated blood pressures, and labetalol was titrated up to 300 mg p.o.  t.i.d. At time of discharge, she was symptom free, and her blood  pressure was noted to be 139/94, though her blood pressures had been in  the 140s to 150s over 90s to low 100s. She was ambulating, tolerating  p.o., passing flatus and voiding without difficulty. Her incision looked  clean, dry and intact. She had a subcuticular closure and did not need  staple removal.   DISCHARGE STATUS:  Stable.   DISCHARGE MEDICATIONS:  1. Ibuprofen 600 mg 1 tablet p.o. every 6 hours as needed for pain.  2. Percocet 5/325 mg 1 to 2 tablets p.o. every 6 hours as needed for      pain.  3. Prenatal vitamins 1 tablets p.o. daily.  4. Colace 100 mg p.o. b.i.d. as needed for constipation.  5. Labetalol 300 mg 1 p.o. t.i.d.  6. Ferrous sulfate 325 mg 1 tablet p.o. b.i.d.   DISCHARGE INSTRUCTIONS:  1. Discharged to home.  2. Regular diet.  3. No heavy lifting greater than 16 pounds for 6 weeks.  4. Nothing in the vagina for 6 weeks.  5. The patient is to follow up at [facility] this following week for      blood pressure check.  6. The patient is to follow up at [facility] in 6 weeks for her post-      partum visit.      Karlton Lemon, MD  Electronically Signed     ______________________________  Shelbie Proctor. Shawnie Pons, M.D.    NS/MEDQ  D:  12/17/2006  T:  12/17/2006  Job:  [id number]

## 2010-08-30 NOTE — Assessment & Plan Note (Signed)
NAME:  [name], PE NO.:  [id number]   MEDICAL RECORD NO.:  [id number]          PATIENT TYPE:  POB   LOCATION:  [facility] at [facility]         FACILITY:  [facility]   PHYSICIAN:  Tinnie Gens, MD        DATE OF BIRTH:  12/29/1980   DATE OF SERVICE:                                  CLINIC NOTE   CHIEF COMPLAINT:  Yearly exam.   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  She complains today of left-sided pelvic pain.  She had this pain before  when she had her IUD in place.  She had tried another IUD, but continues  to have left-sided pelvic pain.  She also has loss of cramping.  She has  very irregular periods and cycles are just dark brown spotting.  Otherwise, she is without significant complaint.  She forgets her blood  pressure medicine on occasion because of her schedule at work.  She  alternates between days and nights; 2 days on, 3 days off; 3 days on, 2  days off.  Alternating schedule all the times is very difficult for her  to remember to take pills, which is why she failed pregnancies in the  past.  She is currently in a stable relationship, but would like to be  married before she contemplates having another child.   PAST MEDICAL HISTORY:  Significant for hypertension.   PAST SURGICAL HISTORY:  C-section, wisdom teeth removal, tonsillectomy,  myringotomy with tube placement as a child.   OB HISTORY:  G1, P1 with 1 cesarean section.   GYN HISTORY:  History of abnormal Paps and had been abnormal for a  while.  She has gotten at least 2 colposcopies in the office, which  basically show her last biopsy was in March of last year and shows  atypia.  She has tendency to not keep her followup Pap smears.   MEDICATIONS:  1. Atenolol 25 mg 1 p.o. daily.  2. Hydrochlorothiazide 25 mg 1 p.o. daily.   ALLERGIES:  1. CEFZIL.  2. AZITHROMYCIN.   FAMILY HISTORY:   Ovarian cancer in her maternal grandmother, and her  mother has familial hemochromatosis.   SOCIAL HISTORY:  She denies tobacco, significant alcohol, or drug use.  She works in a mall.   REVIEW OF SYSTEMS:  A 14-point review of systems is reviewed, negative  for headache, vision changes, abdominal pain, nausea, vomiting,  diarrhea, constipation, fevers, chills, blood in her stool, blood in her  urine, breast mass, vaginal discharge, lower extremity swelling, chest  pain, shortness of breath.   PHYSICAL EXAMINATION:  GENERAL:  She is well developed, well nourished,  and in no acute distress.  VITAL SIGNS:  Blood pressure this morning is 154/111.  HEENT:  Normocephalic, atraumatic.  Sclerae anicteric.  NECK:  Supple.  Normal thyroid.  LUNGS:  Clear bilaterally.  CV:  Regular rate and rhythm.  No rubs, gallops, or murmurs.  ABDOMEN:  Soft, nontender, and nondistended.  EXTREMITIES:  No cyanosis, clubbing, or edema.  BREASTS:  Symmetric with everted nipples.  No masses.  No  supraclavicular or axillary adenopathy.  GU:  Normal external female genitalia.  BUS is normal.  Vagina is pink  and rugated.  Cervix is nulliparous without lesions.  Uterus is small,  anteverted.  No adnexal mass or tenderness.   IMPRESSION:  1. Yearly exam.  2. Undesired fertility with intrauterine device in place.  3. Chronic pelvic pain question related to intrauterine device.  4. Hypertension.   PLAN:  1. Pap smear today.  The patient has a history of abnormal Pap.  We      would call her back if colposcopy is necessary.  2. I have advised her if she would like her IUD removal, take that      out.  If she gets on pills, I have to watch her blood pressure very      closely and advised for better job and remember to take her blood      pressure medication.  She is under 30 and I think this is not      unreasonable at this time.  However, long-term plan for birth      control may be another issue.            ______________________________  Tinnie Gens, MD     TP/MEDQ  D:  10/20/2009  T:  10/21/2009  Job:  [id number]

## 2010-08-30 NOTE — Assessment & Plan Note (Signed)
NAME:  [name], [name]              ACCOUNT NO.:  [id number]   MEDICAL RECORD NO.:  [id number]          PATIENT TYPE:  POB   LOCATION:  [facility] at [facility]         FACILITY:  [facility]   PHYSICIAN:  Tinnie Gens, MD        DATE OF BIRTH:  May 04, 1980   DATE OF SERVICE:  07/09/2008                                  CLINIC NOTE   HISTORY OF PRESENT ILLNESS:  The patient is a 30 year old, para 1, who  has previously had CIN I, status post colposcopy and then her last Pap  with CIN I and she is back for colposcopy today.  This was performed.  Please see colposcopy sheet.  Additionally, the patient is complaining  of headaches today.  She thinks it may be related to her birth control.  She is getting workup by her primary care physician but is interested in  changing off OCs and may be going back to Mirena insertion.  Additionally, her blood pressures have been up and so she would like to  change and stay as previously back to an IUD.  She has just pulled it  down in June of last year.  The reason it was removed because of pelvic  pain and cramping, but the patient is fairly insistent that she would  rather go back to this.  So, her biopsy results from her colposcopy will  be back in 2 weeks.  We will have her follow up then for Mirena IUD  insertion.           ______________________________  Tinnie Gens, MD     TP/MEDQ  D:  07/09/2008  T:  07/10/2008  Job:  [id number]

## 2010-08-30 NOTE — Assessment & Plan Note (Signed)
[name], [name]              ACCOUNT NO.:  [id number]   MEDICAL RECORD NO.:  [id number]          PATIENT TYPE:  POB   LOCATION:  [facility]         [facility]ACILITY:  [facility]   PHYSICIAN:  Tinnie Gens, MD        DATE OF BIRTH:  1980/10/15   DATE OF SERVICE:  08/27/2007                                  CLINIC NOTE   CHIEF COMPLAINT:  Pelvic pain.   HISTORY OF PRESENT ILLNESS:  The patient is a 30 year old gravida 1,  para 1, who has IUD for birth control and for the past 2-3 months has  had increasing left-sided pelvic pain.  It starts fairly sharp, last for  a couple hours, ends up being fairly crampy.  It radiates to her left  leg.  Additionally, she reports pain when she needs to have a bowel  movement on that left side and she has had some difficulty emptying her  bladder and complaints of dysuria as well.  Additionally, she had a  colpo with biopsy that was negative for CIN-I back in October and needs  a follow-up Pap smear at 6 months, which would have been April so she is  overdue for that.  She continues to have monthly cycles.  However, her  cycles have gotten very light on the Mirena IUD.   EXAM:  Her vitals are as noted in the chart.  She is a well-developed, well-nourished female in no acute distress.  ABDOMEN:  Soft, nontender, nondistended.  GU:  Normal external female genitalia.  She has some dilated veins in  the labia majora bilaterally, left greater than right.  Labia minora  within normal limits.  BUS is normal.  The vagina is pink and rugated.  Cervix is parous without lesion.  IUD strings are easily visible.  The  uterus is small, anteverted.  There is no right adnexal mass or  tenderness.  The left adnexa have some fullness to it and is exquisitely  tender.   IMPRESSION:  1. History of abnormal Papanicolaou smear, status post colposcopy for      cervical intraepithelial neoplasia #1.  2. Pelvic pain, left-sided, with left-sided fullness on exam,  question      ovarian process.   PLAN:  1. Pelvic sonogram.  2. Check UA.  3. Repeat Pap smear today.  4. Follow up in 2 weeks for results.           ______________________________  Tinnie Gens, MD     TP/MEDQ  D:  08/27/2007  T:  08/27/2007  Job:  [id number]

## 2010-08-30 NOTE — Discharge Summary (Signed)
[name], [name] NO.:  [id number]   MEDICAL RECORD NO.:  [id number]          PATIENT TYPE:  INP   LOCATION:                                FACILITY:  [facility]   PHYSICIAN:  Ginger Carne, MD  DATE OF BIRTH:  [birth year]   DATE OF ADMISSION:  06/27/2006  DATE OF DISCHARGE:                               DISCHARGE SUMMARY   [name] is a 30 year old gravida 1 para 0 at 35-4/7 weeks.  She was  admitted yesterday for rule out preeclampsia.  She had blood pressures  in the 150s/90s in the clinic.  Admission blood pressure were 130s/80s  and as low as 110s/60s.  She has been on labetalol 200 mg p.o. b.i.d.  We kept her overnight, did 24-hour urine study for total protein.  It  was 115 mg/dL.  She does complain of occasional floaty and a headache;  however, she does have a history of chronic headaches.  The headaches or  relieved by Fioricet.  Her DTRs are 2+.  She is trace edema.  Blood  pressures are in the 120-130/70-80 range.   [name] is being discharged with instructions to follow up on Friday,  or sooner if she starts developing any other signs or symptoms of  preeclampsia.      Jacklyn Shell, C.N.M.      Ginger Carne, MD  Electronically Signed    FC/MEDQ  D:  11/28/2006  T:  11/29/2006  Job:  [id number]

## 2010-09-02 NOTE — Group Therapy Note (Signed)
NAME:  [name], HENDEL NO.:  [id number]   MEDICAL RECORD NO.:  [id number]          PATIENT TYPE:  POB   LOCATION:  [facility]                   FACILITY:  [facility]   PHYSICIAN:  Tinnie Gens, MD        DATE OF BIRTH:  [birth year]   DATE OF SERVICE:  10/10/2005                                    CLINIC NOTE   CHIEF COMPLAINT:  Physical exam.   HISTORY OF PRESENT ILLNESS:  The patient is a 30 year old who is a G0 who  works as a Emergency planning/management officer.  She comes in today for her annual exam.  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  The patient also has a history of hypertension.  She takes  hydrochlorothiazide/triamterene in combination.  The patient is also  concerned today about during intercourse her partner stated that he has felt  something hard and has had trouble with ejaculation followed by blood coming  from his penis.   PAST MEDICAL HISTORY:  Hypertension.   PAST SURGICAL HISTORY:  1.  Tubes in her ears.  2.  Tonsillectomy.  3.  Wisdom teeth out.   MEDICATIONS:  1.  Loestrin one p.o. daily.  2.  Hydrochlorothiazide/triamterene 25/12.5 mg daily.   ALLERGIES:  ZITHROMAX AND CEPHALOSPORIN.   PAST OBSTETRICAL HISTORY:  G0.   PAST GYNECOLOGICAL HISTORY:  Menarche at age 20.  Cycles are monthly.  She  also has a history of abnormal Pap smear, but that was a long time ago and  she does not have any recently.   REVIEW OF SYSTEMS:  A 14-point review of system reviewed and is negative for  headache, vision changes, difficulty swallowing, abdominal pain, nausea,  vomiting, diarrhea, constipation, chest pain, shortness of breath, dysuria,  lower extremity swelling, vaginal discharge or breast discomfort.   PHYSICAL EXAMINATION:  VITAL SIGNS:  Blood pressure  130/90.  GENERAL:  She is a well-developed, well-nourished, white female in no acute  distress.  LUNGS:  Clear bilaterally.  CARDIAC:  Regular rate and rhythm without murmurs, rubs or gallops.  NECK:  Supple, normal thyroid.  BREASTS:  Symmetric with everted nipples.  No breast masses.  LYMPHS:  No supraclavicular or axillary adenopathy.  ABDOMEN:  Soft, nontender, nondistended.  EXTREMITIES:  No clubbing, cyanosis or edema with 2+ distal pulses.  GENITALIA:  Normal external female genitalia.  Vagina is pink and rugae.  BUS is normal.  Cervix is nulliparous without lesions.  Uterus is small,  anteverted.  Adnexa without masses or tenderness.   IMPRESSION:  1.  Gynecology exam.  2.  Respiratory consult.   PLAN:  1.  Pap smear today.  2.  GC and chlamydia testing.  3.  Switch to NuvaRing.  4.  Instructions were given to the patient as well as patient information      about this contraception.  Samples x2 were given to the patient as well.  5.  The patient is reassured about there being no abnormality in the vaginal      area or cervix or uterus.  She should have her partner be checked by the      urologist if these symptoms persist with him.           ______________________________  Tinnie Gens, MD     TP/MEDQ  D:  10/10/2005  T:  10/10/2005  Job:  [id number]

## 2010-09-02 NOTE — Letter (Signed)
March 15, 2006     Karie Schwalbe, MD  [address] [place], [place] [address]   RE:  PATIENCE, NUZZO  MRN:  [id number]  /  DOB:  July 07, 1980   The above-named patient was seen in behavioral medicine for evaluation  and possible ongoing psychotherapy on March 13, 2006.  Thank you for  your referral and continued interest in behavioral medicine.    Sincerely,    ______________________________  Royal Hawthorn, PhD  Electronically Signed    [facility]  Phone:  [phone]  Fax:  [phone]   CG/MedQ  DD: 03/15/2006  DT: 03/15/2006  Job #: [id number]

## 2010-09-08 ENCOUNTER — Ambulatory Visit: Payer: Self-pay | Admitting: General Practice

## 2010-09-08 ENCOUNTER — Emergency Department: Payer: Self-pay | Admitting: Emergency Medicine

## 2010-10-07 ENCOUNTER — Ambulatory Visit (INDEPENDENT_AMBULATORY_CARE_PROVIDER_SITE_OTHER): Payer: BC Managed Care – PPO | Admitting: Internal Medicine

## 2010-10-07 ENCOUNTER — Encounter: Payer: Self-pay | Admitting: Internal Medicine

## 2010-10-07 DIAGNOSIS — J069 Acute upper respiratory infection, unspecified: Secondary | ICD-10-CM | POA: Insufficient documentation

## 2010-10-07 DIAGNOSIS — B3731 Acute candidiasis of vulva and vagina: Secondary | ICD-10-CM | POA: Insufficient documentation

## 2010-10-07 DIAGNOSIS — I10 Essential (primary) hypertension: Secondary | ICD-10-CM

## 2010-10-07 DIAGNOSIS — B373 Candidiasis of vulva and vagina: Secondary | ICD-10-CM

## 2010-10-07 MED ORDER — ATENOLOL 25 MG PO TABS: 25.0000 mg | ORAL_TABLET | Freq: Every day | ORAL | Status: DC

## 2010-10-07 MED ORDER — HYDROCHLOROTHIAZIDE 25 MG PO TABS: 25.0000 mg | ORAL_TABLET | Freq: Every day | ORAL | Status: DC

## 2010-10-07 MED ORDER — FLUCONAZOLE 150 MG PO TABS: 150.0000 mg | ORAL_TABLET | Freq: Once | ORAL | Status: AC

## 2010-10-07 NOTE — Assessment & Plan Note (Signed)
Classic symptoms Will try fluconazole May need exam if doesn't respond

## 2010-10-07 NOTE — Assessment & Plan Note (Signed)
Reestablishing for care here Will refill meds  BP Readings from Last 3 Encounters:  10/07/10 120/70  04/05/10 145/104  12/20/07 120/80

## 2010-10-07 NOTE — Assessment & Plan Note (Signed)
Really seems viral Probably same as daughter has Discussed supportive care

## 2010-10-07 NOTE — Progress Notes (Signed)
  Subjective:    Patient ID: [name], female    DOB: 03-07-1981, 30 y.o.   MRN: [id number]  HPI Has not been feeling well Bad sore throat for 2 days Congestion No fever Daughter seen yesterday for fever--no clear diagnosis Has been very tired Some trouble swallowing 2 days ago due to pain---better now No sig headaches No cough Some shortness of breath--relates to nasal congestion  Wants to get meds back here again Some trouble with getting refills from [facility] clinic Wound up with headaches and had to have eval at ER (had head CT etc)  Has had some irritation in vagina Some itching and occ white discharge--esp when in her tight uniform  No current outpatient prescriptions on file prior to visit.   Past Medical History  Diagnosis Date  . Hyperlipidemia   . Hypertension     Past Surgical History  Procedure Date  . Tonsillectomy   . Tympanostomy tube placement     Family History  Problem Relation Age of Onset  . Hypertension Father   . Coronary artery disease Paternal Uncle   . Cancer Paternal Grandfather   . Diabetes Neg Hx     History   Social History  . Marital Status: Single    Spouse Name: N/A    Number of Children: 1  . Years of Education: N/A   Occupational History  . sheriff, [employer]    Social History Main Topics  . Smoking status: Never Smoker   . Smokeless tobacco: Not on file  . Alcohol Use: Yes  . Drug Use: Not on file  . Sexually Active: Not on file   Other Topics Concern  . Not on file   Social History Narrative  . No narrative on file   Review of Systems No nausea or vomiting Appetite slightly off    Objective:   Physical Exam  Constitutional: She appears well-developed and well-nourished. No distress.  HENT:  Head: Normocephalic and atraumatic.  Right Ear: External ear normal.  Mouth/Throat: Oropharynx is clear and moist. No oropharyngeal exudate.       Moderate nasal inflammation  Neck: Normal range of motion.    Pulmonary/Chest: Effort normal and breath sounds normal. No respiratory distress. She has no wheezes. She has no rales.  Musculoskeletal: She exhibits no edema and no tenderness.  Lymphadenopathy:    She has no cervical adenopathy.  Psychiatric: She has a normal mood and affect. Her behavior is normal. Judgment and thought content normal.          Assessment & Plan:

## 2010-10-13 ENCOUNTER — Telehealth: Payer: Self-pay | Admitting: *Deleted

## 2010-10-13 MED ORDER — DOXYCYCLINE HYCLATE 100 MG PO CAPS: 100.0000 mg | ORAL_CAPSULE | Freq: Two times a day (BID) | ORAL | Status: DC

## 2010-10-13 NOTE — Telephone Encounter (Signed)
rx sent to pharmacy by e-script , Spoke with patient and advised results   

## 2010-10-13 NOTE — Telephone Encounter (Signed)
i have been concerned her daughter may have early pertussis  Please send Rx for doxycycline 100mg  bid (#20 x 0)

## 2010-10-13 NOTE — Telephone Encounter (Signed)
Pt states she was seen yesterday for a sore throat and today she is not any better.  She is asking if antibiotic can be called to [facility] [address].

## 2010-10-26 ENCOUNTER — Emergency Department: Payer: Self-pay | Admitting: Emergency Medicine

## 2010-11-13 ENCOUNTER — Emergency Department: Payer: Self-pay | Admitting: *Deleted

## 2011-01-26 ENCOUNTER — Ambulatory Visit (HOSPITAL_COMMUNITY): Payer: BC Managed Care – PPO

## 2011-01-26 ENCOUNTER — Ambulatory Visit (HOSPITAL_COMMUNITY)
Admission: RE | Admit: 2011-01-26 | Discharge: 2011-01-26 | Disposition: A | Discharge status: Home / Self Care | Payer: BC Managed Care – PPO | Source: Ambulatory Visit | Attending: Obstetrics and Gynecology | Admitting: Obstetrics and Gynecology

## 2011-01-26 ENCOUNTER — Other Ambulatory Visit (HOSPITAL_COMMUNITY): Payer: Self-pay | Admitting: Obstetrics and Gynecology

## 2011-01-26 DIAGNOSIS — R109 Unspecified abdominal pain: Secondary | ICD-10-CM

## 2011-01-26 DIAGNOSIS — R1032 Left lower quadrant pain: Secondary | ICD-10-CM

## 2011-01-27 ENCOUNTER — Ambulatory Visit (HOSPITAL_COMMUNITY): Payer: BC Managed Care – PPO

## 2011-01-27 LAB — COMPREHENSIVE METABOLIC PANEL
ALT: 11
ALT: 9
AST: 17
AST: 17
Albumin: 1.9 — ABNORMAL LOW
Albumin: 2.7 — ABNORMAL LOW
Alkaline Phosphatase: 100
Alkaline Phosphatase: 138 — ABNORMAL HIGH
BUN: 2 — ABNORMAL LOW
BUN: 3 — ABNORMAL LOW
CO2: 21
CO2: 27
Calcium: 7.5 — ABNORMAL LOW
Calcium: 8.8
Chloride: 105
Chloride: 106
Creatinine, Ser: 0.46
Creatinine, Ser: 0.64
GFR calc Af Amer: >60
GFR calc Af Amer: >60
GFR calc non Af Amer: >60
GFR calc non Af Amer: >60
Glucose, Bld: 122 — ABNORMAL HIGH
Glucose, Bld: 98
Potassium: 3.2 — ABNORMAL LOW
Potassium: 3.4 — ABNORMAL LOW
Sodium: 134 — ABNORMAL LOW
Sodium: 137
Total Bilirubin: 0.5
Total Bilirubin: 0.5
Total Protein: 4.8 — ABNORMAL LOW
Total Protein: 6.3

## 2011-01-27 LAB — CBC
HCT: 22 — ABNORMAL LOW
HCT: 22.5 — ABNORMAL LOW
HCT: 28 — ABNORMAL LOW
Hemoglobin: 7.8 — CL
Hemoglobin: 8 — ABNORMAL LOW
Hemoglobin: 9.9 — ABNORMAL LOW
MCHC: 35.5
MCHC: 35.5
MCHC: 35.5
MCV: 89.1
MCV: 90.7
MCV: 90.9
Platelets: 243
Platelets: 252
Platelets: 310
RBC: 2.43 — ABNORMAL LOW
RBC: 2.48 — ABNORMAL LOW
RBC: 3.14 — ABNORMAL LOW
RDW: 13
RDW: 13.3
RDW: 13.6
WBC: 11.5 — ABNORMAL HIGH
WBC: 12.7 — ABNORMAL HIGH
WBC: 7.4

## 2011-01-27 LAB — LACTATE DEHYDROGENASE
LDH: 105
LDH: 199

## 2011-01-27 LAB — CCBB MATERNAL DONOR DRAW

## 2011-01-27 LAB — URIC ACID
Uric Acid, Serum: 3.8
Uric Acid, Serum: 4.5

## 2011-01-27 LAB — MAGNESIUM: Magnesium: 4.4 — ABNORMAL HIGH

## 2011-01-27 LAB — RPR: RPR Ser Ql: NONREACTIVE

## 2011-01-30 LAB — URINALYSIS, ROUTINE W REFLEX MICROSCOPIC
Bilirubin Urine: NEGATIVE
Bilirubin Urine: NEGATIVE
Glucose, UA: NEGATIVE
Glucose, UA: NEGATIVE
Hgb urine dipstick: NEGATIVE
Hgb urine dipstick: NEGATIVE
Ketones, ur: 15 — AB
Ketones, ur: NEGATIVE
Nitrite: NEGATIVE
Nitrite: NEGATIVE
Protein, ur: NEGATIVE
Protein, ur: NEGATIVE
Specific Gravity, Urine: 1.02
Specific Gravity, Urine: 1.025
Urobilinogen, UA: 0.2
Urobilinogen, UA: 0.2
pH: 6
pH: 6

## 2011-01-30 LAB — COMPREHENSIVE METABOLIC PANEL
ALT: 8
AST: 12
Albumin: 3 — ABNORMAL LOW
Alkaline Phosphatase: 114
BUN: 5 — ABNORMAL LOW
CO2: 22
Calcium: 9.5
Chloride: 101
Creatinine, Ser: 0.53
GFR calc Af Amer: >60
GFR calc non Af Amer: >60
Glucose, Bld: 89
Potassium: 3.9
Sodium: 133 — ABNORMAL LOW
Total Bilirubin: 0.6
Total Protein: 6.8

## 2011-01-30 LAB — CBC
HCT: 29.5 — ABNORMAL LOW
Hemoglobin: 10.3 — ABNORMAL LOW
MCHC: 35
MCV: 89.7
Platelets: 286
RBC: 3.29 — ABNORMAL LOW
RDW: 12.3
WBC: 10.3

## 2011-01-30 LAB — CREATININE CLEARANCE, URINE, 24 HOUR
Collection Interval-CRCL: 24
Creatinine Clearance: 204 — ABNORMAL HIGH
Creatinine, 24H Ur: 1558
Creatinine, Urine: 62.3
Urine Total Volume-CRCL: 2500

## 2011-01-30 LAB — PROTEIN, URINE, 24 HOUR
Collection Interval-UPROT: 24
Protein, 24H Urine: 125 — ABNORMAL HIGH
Protein, Urine: 5
Urine Total Volume-UPROT: 2500

## 2011-01-30 LAB — URIC ACID: Uric Acid, Serum: 4.1

## 2011-01-30 LAB — STREP B DNA PROBE: Strep Group B Ag: NEGATIVE

## 2011-01-30 LAB — URINE MICROSCOPIC-ADD ON

## 2011-01-30 LAB — LACTATE DEHYDROGENASE: LDH: 96

## 2011-04-06 ENCOUNTER — Encounter: Payer: Self-pay | Admitting: Internal Medicine

## 2011-04-06 ENCOUNTER — Ambulatory Visit (INDEPENDENT_AMBULATORY_CARE_PROVIDER_SITE_OTHER): Payer: BC Managed Care – PPO | Admitting: Internal Medicine

## 2011-04-06 VITALS — BP 128/90 | HR 70 | Temp 97.5°F | Ht 66.0 in | Wt 205.0 lb

## 2011-04-06 DIAGNOSIS — Z0000 Encounter for general adult medical examination without abnormal findings: Secondary | ICD-10-CM | POA: Insufficient documentation

## 2011-04-06 DIAGNOSIS — Z23 Encounter for immunization: Secondary | ICD-10-CM

## 2011-04-06 DIAGNOSIS — E8881 Metabolic syndrome: Secondary | ICD-10-CM | POA: Insufficient documentation

## 2011-04-06 DIAGNOSIS — I10 Essential (primary) hypertension: Secondary | ICD-10-CM

## 2011-04-06 DIAGNOSIS — E785 Hyperlipidemia, unspecified: Secondary | ICD-10-CM

## 2011-04-06 NOTE — Assessment & Plan Note (Signed)
Reasonably healthy Discussed fitness Motivated with upcoming wedding

## 2011-04-06 NOTE — Assessment & Plan Note (Signed)
BP Readings from Last 3 Encounters:  04/06/11 128/90  10/07/10 120/70  04/05/10 145/104   Good control now  No changes needed Labs normal (except chol) 2 months ago---scanned in chart

## 2011-04-06 NOTE — Progress Notes (Signed)
Subjective:    Patient ID: [name], female    DOB: 20-Feb-1981, 30 y.o.   MRN: [id number]  HPI On amoxicillin for sinus infection from employee health Feeling better now  Otherwise doing well Sees Dr Huntley Dec at [employer] [facility] and getting married in September Living with finacee and 69 year old son  Has home BP monitor Checks occ 120s/80s usually  Current Outpatient Prescriptions on File Prior to Visit  Medication Sig Dispense Refill  . atenolol (TENORMIN) 25 MG tablet Take 1 tablet (25 mg total) by mouth daily.  30 tablet  11  . doxycycline (VIBRAMYCIN) 100 MG capsule Take 1 capsule (100 mg total) by mouth 2 (two) times daily.  20 capsule  0  . hydrochlorothiazide 25 MG tablet Take 1 tablet (25 mg total) by mouth daily.  30 tablet  11  . SPRINTEC 28 0.25-35 MG-MCG per tablet Take 1 tablet by mouth daily.         Allergies  Allergen Reactions  . Azithromycin     REACTION: rash  . Cefprozil   . Cefuroxime Axetil     REACTION: rash  . Sulfamethoxazole W/Trimethoprim     REACTION: rash    Past Medical History  Diagnosis Date  . Hyperlipidemia   . Hypertension     Past Surgical History  Procedure Date  . Tonsillectomy   . Tympanostomy tube placement     Family History  Problem Relation Age of Onset  . Hypertension Father   . Coronary artery disease Paternal Uncle   . Cancer Paternal Grandfather   . Diabetes Neg Hx     History   Social History  . Marital Status: Single    Spouse Name: N/A    Number of Children: 1  . Years of Education: N/A   Occupational History  . sheriff, [employer]    Social History Main Topics  . Smoking status: Never Smoker   . Smokeless tobacco: Never Used  . Alcohol Use: Yes  . Drug Use: Not on file  . Sexually Active: Not on file   Other Topics Concern  . Not on file   Social History Narrative  . No narrative on file   Review of Systems  Constitutional: Positive for unexpected  weight change. Negative for fatigue.       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        Regular with dentist  Eyes: Negative for visual disturbance.       No diplopia or unilateral vision loss  Respiratory: Negative for cough, chest tightness and shortness of breath.   Cardiovascular: Negative for chest pain, palpitations and leg swelling.  Gastrointestinal: Negative for nausea, vomiting, abdominal pain, constipation and blood in stool.       No heartburn  Genitourinary: Negative for dysuria, difficulty urinating and dyspareunia.       Periods generally regular on pill  Musculoskeletal: Negative for back pain, joint swelling and arthralgias.  Skin: Negative for pallor and rash.  Neurological: Negative for dizziness, syncope, weakness, light-headedness, numbness and headaches.  Hematological: Negative for adenopathy. Does not bruise/bleed easily.  Psychiatric/Behavioral: Negative for sleep disturbance and dysphoric mood. The patient is not nervous/anxious.        Objective:   Physical Exam  Constitutional: She is oriented to person, place, and time. She appears  well-developed and well-nourished. No distress.  HENT:  Head: Normocephalic and atraumatic.  Right Ear: External ear normal.  Left Ear: External ear normal.  Mouth/Throat: Oropharynx is clear and moist. No oropharyngeal exudate.       TMs normal  Eyes: Conjunctivae and EOM are normal. Pupils are equal, round, and reactive to light.       Fundi benign  Neck: Normal range of motion. Neck supple. No thyromegaly present.  Cardiovascular: Normal rate, regular rhythm, normal heart sounds and intact distal pulses.  Exam reveals no gallop.   No murmur heard. Pulmonary/Chest: Effort normal and breath sounds normal. No respiratory distress. She has no wheezes.  She has no rales.  Abdominal: Soft. There is no tenderness.  Musculoskeletal: Normal range of motion. She exhibits no edema and no tenderness.  Lymphadenopathy:    She has no cervical adenopathy.  Neurological: She is alert and oriented to person, place, and time.  Skin: No rash noted. No erythema.  Psychiatric: She has a normal mood and affect. Her behavior is normal. Judgment and thought content normal.          Assessment & Plan:

## 2011-04-06 NOTE — Assessment & Plan Note (Signed)
Discussed the importance of weight loss for future health

## 2011-04-06 NOTE — Assessment & Plan Note (Signed)
Total 310 and triglycerides over 700 No clear evidence that Rx is beneficial in low risk cardiac setting May want to consider statin when older

## 2011-06-27 ENCOUNTER — Other Ambulatory Visit: Payer: Self-pay | Admitting: Obstetrics and Gynecology

## 2011-06-27 DIAGNOSIS — N63 Unspecified lump in unspecified breast: Secondary | ICD-10-CM

## 2011-06-29 ENCOUNTER — Ambulatory Visit
Admission: RE | Admit: 2011-06-29 | Discharge: 2011-06-29 | Disposition: A | Discharge status: Home / Self Care | Payer: BC Managed Care – PPO | Source: Ambulatory Visit | Attending: Obstetrics and Gynecology | Admitting: Obstetrics and Gynecology

## 2011-06-29 ENCOUNTER — Other Ambulatory Visit: Payer: BC Managed Care – PPO

## 2011-06-29 DIAGNOSIS — N63 Unspecified lump in unspecified breast: Secondary | ICD-10-CM

## 2011-11-02 ENCOUNTER — Emergency Department: Payer: Self-pay | Admitting: Emergency Medicine

## 2011-11-02 LAB — COMPREHENSIVE METABOLIC PANEL
Albumin: 3.8 g/dL (ref 3.4–5.0)
Alkaline Phosphatase: 62 U/L (ref 50–136)
Anion Gap: 8 (ref 7–16)
BUN: 15 mg/dL (ref 7–18)
Bilirubin,Total: 0.3 mg/dL (ref 0.2–1.0)
Calcium, Total: 9.1 mg/dL (ref 8.5–10.1)
Chloride: 104 mmol/L (ref 98–107)
Co2: 29 mmol/L (ref 21–32)
Creatinine: 0.77 mg/dL (ref 0.60–1.30)
EGFR (African American): >60
EGFR (Non-African Amer.): >60
Glucose: 111 mg/dL — ABNORMAL HIGH (ref 65–99)
Osmolality: 283 (ref 275–301)
Potassium: 4.1 mmol/L (ref 3.5–5.1)
SGOT(AST): 24 U/L (ref 15–37)
SGPT (ALT): 27 U/L
Sodium: 141 mmol/L (ref 136–145)
Total Protein: 8 g/dL (ref 6.4–8.2)

## 2011-11-02 LAB — URINALYSIS, COMPLETE
Bilirubin,UR: NEGATIVE
Blood: NEGATIVE
Glucose,UR: NEGATIVE mg/dL (ref 0–75)
Ketone: NEGATIVE
Leukocyte Esterase: NEGATIVE
Nitrite: NEGATIVE
Ph: 6 (ref 4.5–8.0)
Protein: NEGATIVE
RBC,UR: 1 /HPF (ref 0–5)
Specific Gravity: 1.016 (ref 1.003–1.030)
Squamous Epithelial: 3
WBC UR: 2 /HPF (ref 0–5)

## 2011-11-02 LAB — CBC
HCT: 39.3 % (ref 35.0–47.0)
HGB: 13 g/dL (ref 12.0–16.0)
MCH: 30.4 pg (ref 26.0–34.0)
MCHC: 33.1 g/dL (ref 32.0–36.0)
MCV: 92 fL (ref 80–100)
Platelet: 315 10*3/uL (ref 150–440)
RBC: 4.28 10*6/uL (ref 3.80–5.20)
RDW: 12.8 % (ref 11.5–14.5)
WBC: 9.2 10*3/uL (ref 3.6–11.0)

## 2011-11-02 LAB — PREGNANCY, URINE: Pregnancy Test, Urine: NEGATIVE m[IU]/mL

## 2011-11-18 ENCOUNTER — Encounter (HOSPITAL_COMMUNITY): Payer: Self-pay | Admitting: Pharmacist

## 2011-11-24 ENCOUNTER — Encounter (HOSPITAL_COMMUNITY): Payer: Self-pay

## 2011-11-24 ENCOUNTER — Encounter (HOSPITAL_COMMUNITY)
Admission: RE | Admit: 2011-11-24 | Discharge: 2011-11-24 | Disposition: A | Discharge status: Home / Self Care | Payer: BC Managed Care – PPO | Source: Ambulatory Visit | Attending: Obstetrics and Gynecology | Admitting: Obstetrics and Gynecology

## 2011-11-24 LAB — CBC
HCT: 37.7 % (ref 36.0–46.0)
Hemoglobin: 12.7 g/dL (ref 12.0–15.0)
MCH: 30.2 pg (ref 26.0–34.0)
MCHC: 33.7 g/dL (ref 30.0–36.0)
MCV: 89.5 fL (ref 78.0–100.0)
Platelets: 355 10*3/uL (ref 150–400)
RBC: 4.21 MIL/uL (ref 3.87–5.11)
RDW: 12.6 % (ref 11.5–15.5)
WBC: 8.3 10*3/uL (ref 4.0–10.5)

## 2011-11-24 LAB — BASIC METABOLIC PANEL
BUN: 15 mg/dL (ref 6–23)
CO2: 30 mEq/L (ref 19–32)
Calcium: 9.5 mg/dL (ref 8.4–10.5)
Chloride: 99 mEq/L (ref 96–112)
Creatinine, Ser: 0.74 mg/dL (ref 0.50–1.10)
GFR calc Af Amer: >90 mL/min (ref >90)
GFR calc non Af Amer: >90 mL/min (ref >90)
Glucose, Bld: 88 mg/dL (ref 70–99)
Potassium: 4 mEq/L (ref 3.5–5.1)
Sodium: 137 mEq/L (ref 135–145)

## 2011-11-24 LAB — SURGICAL PCR SCREEN
MRSA, PCR: NEGATIVE
Staphylococcus aureus: NEGATIVE

## 2011-11-24 NOTE — Patient Instructions (Addendum)
21 [name]  11/24/2011   Your procedure is scheduled on:  12/01/11  Enter through the Main Entrance of [facility] at 6 AM.  Pick up the phone at the desk and dial [phone].   Call this number if you have problems the morning of surgery: [phone]   Remember:   Do not eat food:After Midnight.  Do not drink clear liquids: After Midnight.  Take these medicines the morning of surgery with A SIP OF WATER: NA   Do not wear jewelry, make-up or nail polish.  Do not wear lotions, powders, or perfumes. You may wear deodorant.  Do not shave 48 hours prior to surgery.  Do not bring valuables to the hospital.  Contacts, dentures or bridgework may not be worn into surgery.  Leave suitcase in the car. After surgery it may be brought to your room.  For patients admitted to the hospital, checkout time is 11:00 AM the day of discharge.   Patients discharged the day of surgery will not be allowed to drive home.  Name and phone number of your driver: NA  Special Instructions: CHG Shower Use Special Wash: 1/2 bottle night before surgery and 1/2 bottle morning of surgery.   Please read over the following fact sheets that you were given: MRSA Information

## 2011-11-29 NOTE — H&P (Signed)
[name] is an 31 y.o. female increasingly severe premenstrual and menstrual pain. U/S  In October of 2012 and abdominopelvic CT this year both normal.  Pertinent Gynecological History: Menses: flow is moderate Bleeding: N/A Contraception: none DES exposure: denies Blood transfusions: none Sexually transmitted diseases: history of genital warts Previous GYN Procedures: none  Last mammogram: N/A Date: none Last pap: normal Date: 2012 OB History: G1, P1   Menstrual History: Menarche age: unknown No LMP recorded. Patient is not currently having periods (Reason: Other).    Past Medical History  Diagnosis Date  . Hyperlipidemia   . Hypertension     Past Surgical History  Procedure Date  . Tonsillectomy   . Tympanostomy tube placement   . Cesarean section 2008    Family History  Problem Relation Age of Onset  . Hypertension Father   . Coronary artery disease Paternal Uncle   . Cancer Paternal Grandfather   . Diabetes Neg Hx     Social History:  reports that she has never smoked. She has never used smokeless tobacco. She reports that she drinks alcohol. She reports that she does not use illicit drugs.  Allergies:  Allergies  Allergen Reactions  . Azithromycin Rash  . Cephalosporins Rash    Allergy noted with cefprozil (Cefzil) and cefuroxime (Ceftin)  . Penicillins Rash    Allergy noted with Amoxicillin  . Sulfamethoxazole W-Trimethoprim Swelling and Rash    No prescriptions prior to admission    Review of Systems  Constitutional: Negative for fever.    There were no vitals taken for this visit. Physical Exam  Constitutional: She appears well-developed and well-nourished.  Cardiovascular: Normal rate and regular rhythm.   Respiratory: Effort normal and breath sounds normal.  GI: Soft. Bowel sounds are normal. There is no tenderness.  Genitourinary:       Vulva/vagina/cervix without lesion Uterus normal size, mobile, non tender Adnexa non tender  without masses    No results found for this or any previous visit (from the past 24 hour(s)).  No results found.  Assessment/Plan: 31 yo G1P1 with increasingly severe premenstrual and menstrual pain for laparoscopy.  Alternatives, risks reviewed preoperatively.  Sarit Sparano II,Beva Remund E 11/29/2011, 6:45 PM

## 2011-11-30 MED ORDER — CLINDAMYCIN PHOSPHATE 900 MG/50ML IV SOLN
900.0000 mg | INTRAVENOUS | Status: DC
  Filled 2011-11-30: qty 50

## 2011-11-30 MED ORDER — CIPROFLOXACIN IN D5W 400 MG/200ML IV SOLN
400.0000 mg | INTRAVENOUS | Status: DC
  Filled 2011-11-30: qty 200

## 2011-12-01 ENCOUNTER — Encounter (HOSPITAL_COMMUNITY)
Admission: RE | Disposition: A | Discharge status: Home / Self Care | Payer: Self-pay | Source: Ambulatory Visit | Attending: Obstetrics and Gynecology

## 2011-12-01 ENCOUNTER — Ambulatory Visit (HOSPITAL_COMMUNITY): Payer: BC Managed Care – PPO | Admitting: Anesthesiology

## 2011-12-01 ENCOUNTER — Encounter (HOSPITAL_COMMUNITY): Payer: Self-pay | Admitting: Anesthesiology

## 2011-12-01 ENCOUNTER — Ambulatory Visit (HOSPITAL_COMMUNITY)
Admission: RE | Admit: 2011-12-01 | Discharge: 2011-12-01 | Disposition: A | Discharge status: Home / Self Care | Payer: BC Managed Care – PPO | Source: Ambulatory Visit | Attending: Obstetrics and Gynecology | Admitting: Obstetrics and Gynecology

## 2011-12-01 DIAGNOSIS — N946 Dysmenorrhea, unspecified: Secondary | ICD-10-CM | POA: Insufficient documentation

## 2011-12-01 HISTORY — PX: LAPAROSCOPY: SHX197

## 2011-12-01 HISTORY — PX: BIOPSY: SHX5522

## 2011-12-01 SURGERY — LAPAROSCOPY OPERATIVE
Anesthesia: General | Site: Abdomen | Wound class: Clean

## 2011-12-01 MED ORDER — FENTANYL CITRATE 0.05 MG/ML IJ SOLN
INTRAMUSCULAR | Status: AC
  Administered 2011-12-01: 50 ug via INTRAVENOUS
  Filled 2011-12-01: qty 2

## 2011-12-01 MED ORDER — ROCURONIUM BROMIDE 50 MG/5ML IV SOLN
INTRAVENOUS | Status: AC
  Filled 2011-12-01: qty 1

## 2011-12-01 MED ORDER — GLYCOPYRROLATE 0.2 MG/ML IJ SOLN
INTRAMUSCULAR | Status: DC | PRN
  Administered 2011-12-01: 0.6 mg via INTRAVENOUS

## 2011-12-01 MED ORDER — FENTANYL CITRATE 0.05 MG/ML IJ SOLN
25.0000 ug | INTRAMUSCULAR | Status: DC | PRN
  Administered 2011-12-01: 25 ug via INTRAVENOUS
  Administered 2011-12-01: 50 ug via INTRAVENOUS
  Administered 2011-12-01: 25 ug via INTRAVENOUS

## 2011-12-01 MED ORDER — KETOROLAC TROMETHAMINE 30 MG/ML IJ SOLN
INTRAMUSCULAR | Status: AC
  Filled 2011-12-01: qty 1

## 2011-12-01 MED ORDER — ONDANSETRON HCL 4 MG/2ML IJ SOLN
INTRAMUSCULAR | Status: AC
  Filled 2011-12-01: qty 2

## 2011-12-01 MED ORDER — BUPIVACAINE HCL (PF) 0.5 % IJ SOLN
INTRAMUSCULAR | Status: DC | PRN
  Administered 2011-12-01: 5 mL
  Administered 2011-12-01: 25 mL via INTRA_ARTICULAR

## 2011-12-01 MED ORDER — PROMETHAZINE HCL 25 MG/ML IJ SOLN: 6.2500 mg | INTRAMUSCULAR | Status: DC | PRN

## 2011-12-01 MED ORDER — KETOROLAC TROMETHAMINE 30 MG/ML IJ SOLN
INTRAMUSCULAR | Status: DC | PRN
  Administered 2011-12-01: 30 mg via INTRAVENOUS

## 2011-12-01 MED ORDER — LACTATED RINGERS IV SOLN
INTRAVENOUS | Status: DC
  Administered 2011-12-01: 07:00:00 via INTRAVENOUS

## 2011-12-01 MED ORDER — NEOSTIGMINE METHYLSULFATE 1 MG/ML IJ SOLN
INTRAMUSCULAR | Status: AC
  Filled 2011-12-01: qty 10

## 2011-12-01 MED ORDER — CIPROFLOXACIN IN D5W 400 MG/200ML IV SOLN
INTRAVENOUS | Status: DC | PRN
  Administered 2011-12-01: 400 mg via INTRAVENOUS

## 2011-12-01 MED ORDER — PROPOFOL 10 MG/ML IV EMUL
INTRAVENOUS | Status: AC
  Filled 2011-12-01: qty 40

## 2011-12-01 MED ORDER — FENTANYL CITRATE 0.05 MG/ML IJ SOLN
INTRAMUSCULAR | Status: AC
  Filled 2011-12-01: qty 5

## 2011-12-01 MED ORDER — LACTATED RINGERS IV SOLN
INTRAVENOUS | Status: DC | PRN
  Administered 2011-12-01 (×2): via INTRAVENOUS

## 2011-12-01 MED ORDER — LIDOCAINE HCL (CARDIAC) 20 MG/ML IV SOLN
INTRAVENOUS | Status: DC | PRN
  Administered 2011-12-01: 100 mg via INTRAVENOUS

## 2011-12-01 MED ORDER — ONDANSETRON HCL 4 MG/2ML IJ SOLN
INTRAMUSCULAR | Status: DC | PRN
  Administered 2011-12-01: 4 mg via INTRAVENOUS

## 2011-12-01 MED ORDER — KETOROLAC TROMETHAMINE 30 MG/ML IJ SOLN: 15.0000 mg | Freq: Once | INTRAMUSCULAR | Status: DC | PRN

## 2011-12-01 MED ORDER — CLINDAMYCIN PHOSPHATE 600 MG/50ML IV SOLN
INTRAVENOUS | Status: DC | PRN
  Administered 2011-12-01: 900 mg via INTRAVENOUS

## 2011-12-01 MED ORDER — MIDAZOLAM HCL 5 MG/5ML IJ SOLN
INTRAMUSCULAR | Status: DC | PRN
  Administered 2011-12-01: 2 mg via INTRAVENOUS

## 2011-12-01 MED ORDER — MEPERIDINE HCL 25 MG/ML IJ SOLN: 6.2500 mg | INTRAMUSCULAR | Status: DC | PRN

## 2011-12-01 MED ORDER — MIDAZOLAM HCL 2 MG/2ML IJ SOLN
INTRAMUSCULAR | Status: AC
  Filled 2011-12-01: qty 2

## 2011-12-01 MED ORDER — LACTATED RINGERS IR SOLN
Status: DC | PRN
  Administered 2011-12-01: 3000 mL

## 2011-12-01 MED ORDER — ROCURONIUM BROMIDE 100 MG/10ML IV SOLN
INTRAVENOUS | Status: DC | PRN
  Administered 2011-12-01: 40 mg via INTRAVENOUS

## 2011-12-01 MED ORDER — LABETALOL HCL 5 MG/ML IV SOLN
5.0000 mg | Freq: Once | INTRAVENOUS | Status: AC
  Administered 2011-12-01: 5 mg via INTRAVENOUS
  Filled 2011-12-01: qty 4

## 2011-12-01 MED ORDER — FENTANYL CITRATE 0.05 MG/ML IJ SOLN
INTRAMUSCULAR | Status: DC | PRN
  Administered 2011-12-01: 100 ug via INTRAVENOUS
  Administered 2011-12-01 (×3): 50 ug via INTRAVENOUS

## 2011-12-01 MED ORDER — BUPIVACAINE HCL (PF) 0.5 % IJ SOLN
INTRAMUSCULAR | Status: AC
  Filled 2011-12-01: qty 30

## 2011-12-01 MED ORDER — PROPOFOL 10 MG/ML IV EMUL
INTRAVENOUS | Status: DC | PRN
  Administered 2011-12-01: 200 mg via INTRAVENOUS

## 2011-12-01 MED ORDER — GLYCOPYRROLATE 0.2 MG/ML IJ SOLN
INTRAMUSCULAR | Status: AC
  Filled 2011-12-01: qty 3

## 2011-12-01 MED ORDER — NEOSTIGMINE METHYLSULFATE 1 MG/ML IJ SOLN
INTRAMUSCULAR | Status: DC | PRN
  Administered 2011-12-01: 4 mg via INTRAVENOUS

## 2011-12-01 MED ORDER — MIDAZOLAM HCL 2 MG/2ML IJ SOLN: 0.5000 mg | Freq: Once | INTRAMUSCULAR | Status: DC | PRN

## 2011-12-01 MED ORDER — MIDAZOLAM HCL 5 MG/5ML IJ SOLN: INTRAMUSCULAR | Status: DC | PRN

## 2011-12-01 MED ORDER — SODIUM CHLORIDE 0.9 % IJ SOLN
INTRAMUSCULAR | Status: DC | PRN
  Administered 2011-12-01: 2 mL

## 2011-12-01 MED ORDER — LABETALOL HCL 5 MG/ML IV SOLN: 5.0000 mg | Freq: Once | INTRAVENOUS | Status: DC

## 2011-12-01 MED ORDER — LIDOCAINE HCL (CARDIAC) 20 MG/ML IV SOLN
INTRAVENOUS | Status: AC
  Filled 2011-12-01: qty 5

## 2011-12-01 SURGICAL SUPPLY — 26 items
BARRIER ADHS 3X4 INTERCEED (GAUZE/BANDAGES/DRESSINGS) IMPLANT
CABLE HIGH FREQUENCY MONO STRZ (ELECTRODE) ×2 IMPLANT
CATH ROBINSON RED A/P 16FR (CATHETERS) ×2 IMPLANT
CLOTH BEACON ORANGE TIMEOUT ST (SAFETY) ×2 IMPLANT
DERMABOND ADVANCED (GAUZE/BANDAGES/DRESSINGS) ×2
DERMABOND ADVANCED .7 DNX12 (GAUZE/BANDAGES/DRESSINGS) ×2 IMPLANT
GLOVE BIO SURGEON STRL SZ8 (GLOVE) ×4 IMPLANT
GOWN PREVENTION PLUS LG XLONG (DISPOSABLE) ×2 IMPLANT
NEEDLE INSUFFLATION 14GA 120MM (NEEDLE) ×2 IMPLANT
NS IRRIG 1000ML POUR BTL (IV SOLUTION) ×2 IMPLANT
PACK LAPAROSCOPY BASIN (CUSTOM PROCEDURE TRAY) ×2 IMPLANT
POUCH SPECIMEN RETRIEVAL 10MM (ENDOMECHANICALS) IMPLANT
PROTECTOR NERVE ULNAR (MISCELLANEOUS) ×2 IMPLANT
SEALER TISSUE G2 CVD JAW 35 (ENDOMECHANICALS) IMPLANT
SEALER TISSUE G2 CVD JAW 45CM (ENDOMECHANICALS) IMPLANT
SET IRRIG TUBING LAPAROSCOPIC (IRRIGATION / IRRIGATOR) ×2 IMPLANT
STRIP CLOSURE SKIN 1/4X3 (GAUZE/BANDAGES/DRESSINGS) IMPLANT
SUT VIC AB 3-0 PS2 18 (SUTURE)
SUT VIC AB 3-0 PS2 18XBRD (SUTURE) IMPLANT
SUT VICRYL 0 UR6 27IN ABS (SUTURE) ×2 IMPLANT
SYR 30ML LL (SYRINGE) ×2 IMPLANT
TOWEL OR 17X24 6PK STRL BLUE (TOWEL DISPOSABLE) ×4 IMPLANT
TROCAR XCEL NON-BLD 11X100MML (ENDOMECHANICALS) ×2 IMPLANT
TROCAR XCEL NON-BLD 5MMX100MML (ENDOMECHANICALS) ×2 IMPLANT
WARMER LAPAROSCOPE (MISCELLANEOUS) ×2 IMPLANT
WATER STERILE IRR 1000ML POUR (IV SOLUTION) ×2 IMPLANT

## 2011-12-01 NOTE — Transfer of Care (Signed)
Immediate Anesthesia Transfer of Care Note  Patient: [name]  Procedure(s) Performed: Procedure(s) (LRB): LAPAROSCOPY OPERATIVE (N/A) BIOPSY (N/A)  Patient Location: PACU  Anesthesia Type: General  Level of Consciousness: awake, sedated, patient cooperative and responds to stimulation  Airway & Oxygen Therapy: Patient Spontanous Breathing and Patient connected to face mask oxygen  Post-op Assessment: Report given to PACU RN, Post -op Vital signs reviewed and stable and Patient moving all extremities  Post vital signs: Reviewed and stable  Complications: No apparent anesthesia complications

## 2011-12-01 NOTE — Op Note (Signed)
NAMESAYWARD, HORVATH              ACCOUNT NO.:  [id number]  MEDICAL RECORD NO.:  [id number]  LOCATION:  [facility]                          FACILITY:  [facility]  PHYSICIAN:  Guy Sandifer. Henderson Cloud, M.D. DATE OF BIRTH:  19-Dec-1980  DATE OF PROCEDURE:  12/01/2011 DATE OF DISCHARGE:                              OPERATIVE REPORT   PREOPERATIVE DIAGNOSIS:  Dysmenorrhea.  POSTOPERATIVE DIAGNOSIS:  Dysmenorrhea.  PROCEDURE:  Laparoscopy with biopsy of left pelvic peritoneum.  SURGEON:  Guy Sandifer. Henderson Cloud, MD  ANESTHESIA:  General with endotracheal intubation.  ESTIMATED BLOOD LOSS:  Drops.  INDICATIONS AND CONSENT:  The patient is a 31 year old patient with increasingly severe premenstrual and menstrual pain.  Ultrasound and abdominopelvic CAT scan had been normal.  After discussion of options, she was taken to the operating room for laparoscopy.  Potential risks and complications were discussed preoperatively including, but not limited to, infection, organ damage, bleeding requiring transfusion of blood products with HIV and hepatitis acquisition, DVT, PE, pneumonia. Possible negative laparoscopy, severe findings, recurrent or persistent pain or painful intercourse have also been reviewed.  All questions have been answered and consent is signed on the chart.  FINDINGS:  Upper abdomen is normal.  Appendix is normal.  Uterus is normal in size and contour.  Anterior and posterior cul-de-sacs are normal.  Fallopian tubes, and ovaries are normal bilaterally.  Right pelvic sidewall is normal.  On the left pelvic sidewall, 1-2 cm above the course of the left ureter is a small puckered area possibly consistent with endometriosis.  PROCEDURE:  The patient was taken to the operating room, where she was identified, placed in dorsal supine position and general anesthesia was induced via endotracheal intubation.  She was then placed in a dorsal lithotomy position.  Time-out has been taken.  She was  prepped abdominally and vaginally.  Bladder straight catheterized.  Hulka tenaculum was placed.  The uterus was manipulated and she was draped in a sterile fashion.  The infraumbilical and suprapubic areas were injected with approximately 5 mL of 0.5% plain Marcaine.  A small infraumbilical incision was made.  Disposable Veress needle was placed with a good syringe and drop test.  A 2 L of gas were then insufflated under low pressure with good tympany in the right upper quadrant. Veress needle was removed.  A 10/11 bladeless disposable XCEL trocar sleeve was then placed using the diagnostic laparoscope under direct visualization.  After placement, the operative laparoscope was used.  A small suprapubic incision was made in the midline and a 5-mm disposable trocar sleeve was placed under direct visualization without difficulty. The above findings were noted.  A small incision was made with the Metzenbaum type scissors above the implant well above the course of the ureter.  High dissection was used to raise the peritoneum as well. Then, under good visualization, a biopsy was taken approximately 5-6 mm in diameter of the peritoneum.  Minor brief bipolar cautery was used on the peritoneal edges to obtain complete hemostasis.  Interceed was back loaded through the laparoscope and placed over the site of the biopsy. The remaining 25 mL of 0.5% plain Marcaine was instilled in the peritoneal cavity.  Good hemostasis was  noted all around.  The suprapubic trocar sleeve was removed, pneumoperitoneum was reduced, and the umbilical trocar sleeve was removed.  Both incisions were closed with Dermabond.  Hulka tenaculum was removed and good hemostasis was noted.  All counts were correct.  The patient was awakened and taken to recovery room in stable condition.     Guy Sandifer Henderson Cloud, M.D.     JET/MEDQ  D:  12/01/2011  T:  12/01/2011  Job:  [id number]

## 2011-12-01 NOTE — Progress Notes (Signed)
D/W patient procedure No changes to H&P per patient history 

## 2011-12-01 NOTE — Anesthesia Preprocedure Evaluation (Addendum)
Anesthesia Evaluation  Patient identified by MRN, date of birth, ID band Patient awake    Reviewed: Allergy & Precautions, H&P , Patient's Chart, lab work & pertinent test results, reviewed documented beta blocker date and time   History of Anesthesia Complications Negative for: history of anesthetic complications  Airway Mallampati: III TM Distance: >3 FB Neck ROM: full  Mouth opening: Limited Mouth Opening  Dental No notable dental hx.    Pulmonary neg pulmonary ROS,  breath sounds clear to auscultation  Pulmonary exam normal       Cardiovascular Exercise Tolerance: Good hypertension, negative cardio ROS  Rhythm:regular Rate:Normal     Neuro/Psych negative neurological ROS  negative psych ROS   GI/Hepatic negative GI ROS, Neg liver ROS,   Endo/Other  negative endocrine ROS  Renal/GU negative Renal ROS     Musculoskeletal   Abdominal   Peds  Hematology negative hematology ROS (+)   Anesthesia Other Findings BP poorly controlled- today 151/102  hyperlipidemia  Reproductive/Obstetrics negative OB ROS                          Anesthesia Physical Anesthesia Plan  ASA: III  Anesthesia Plan: General ETT   Post-op Pain Management:    Induction:   Airway Management Planned:   Additional Equipment:   Intra-op Plan:   Post-operative Plan:   Informed Consent: I have reviewed the patients History and Physical, chart, labs and discussed the procedure including the risks, benefits and alternatives for the proposed anesthesia with the patient or authorized representative who has indicated his/her understanding and acceptance.   Dental Advisory Given  Plan Discussed with: CRNA and Surgeon  Anesthesia Plan Comments:         Anesthesia Quick Evaluation

## 2011-12-01 NOTE — Anesthesia Procedure Notes (Signed)
Procedure Name: Intubation Date/Time: 12/01/2011 7:34 AM Performed by: Jessica Priest Pre-anesthesia Checklist: Patient identified, Emergency Drugs available, Suction available and Patient being monitored Patient Re-evaluated:Patient Re-evaluated prior to inductionOxygen Delivery Method: Circle System Utilized Preoxygenation: Pre-oxygenation with 100% oxygen Intubation Type: IV induction Ventilation: Mask ventilation without difficulty Laryngoscope Size: Mac and 3 Grade View: Grade I Tube type: Oral Tube size: 7.0 mm Number of attempts: 1 Airway Equipment and Method: stylet and oral airway Placement Confirmation: ETT inserted through vocal cords under direct vision,  positive ETCO2 and breath sounds checked- equal and bilateral Secured at: 23 cm Tube secured with: Tape Dental Injury: Teeth and Oropharynx as per pre-operative assessment

## 2011-12-01 NOTE — Brief Op Note (Signed)
12/01/2011  8:15 AM  PATIENT:  [name]  31 y.o. female  PRE-OPERATIVE DIAGNOSIS:  dysmenorrhea  POST-OPERATIVE DIAGNOSIS:  dysmenorrhea  PROCEDURE:  Procedure(s) (LRB): LAPAROSCOPY OPERATIVE (N/A), biopsy of pelvis  SURGEON:  Surgeon(s) and Role:    * Leslie Andrea, MD - Primary  PHYSICIAN ASSISTANT:   ASSISTANTS: none   ANESTHESIA:   general  EBL:  Total I/O In: 1000 [I.V.:1000] Out: -   BLOOD ADMINISTERED:none  DRAINS: none   LOCAL MEDICATIONS USED:  MARCAINE     SPECIMEN:  Source of Specimen:  biopsy of left pelvis  DISPOSITION OF SPECIMEN:  PATHOLOGY  COUNTS:  YES  TOURNIQUET:  * No tourniquets in log *  DICTATION: .Other Dictation: Dictation Number [id number]  PLAN OF CARE: Discharge to home after PACU  PATIENT DISPOSITION:  PACU - hemodynamically stable.   Delay start of Pharmacological VTE agent (>24hrs) due to surgical blood loss or risk of bleeding: not applicable

## 2011-12-01 NOTE — Progress Notes (Signed)
Dr Jean Rosenthal notified of hypertension x 3 readings on arrival from OR.  Not having any discomfort but seems very tense and is very teary.  Order received to give Labetalol 5 mg IV now.

## 2011-12-01 NOTE — Anesthesia Postprocedure Evaluation (Signed)
  Anesthesia Post-op Note  Patient: [name]  Procedure(s) Performed: Procedure(s) (LRB): LAPAROSCOPY OPERATIVE (N/A) BIOPSY (N/A) Patient is awake and responsive. Pain and nausea are reasonably well controlled. Vital signs are stable and clinically acceptable. Oxygen saturation is clinically acceptable. There are no apparent anesthetic complications at this time. Patient is ready for discharge.

## 2011-12-04 ENCOUNTER — Encounter (HOSPITAL_COMMUNITY): Payer: Self-pay | Admitting: Obstetrics and Gynecology

## 2011-12-14 ENCOUNTER — Other Ambulatory Visit: Payer: Self-pay | Admitting: Internal Medicine

## 2012-04-05 ENCOUNTER — Encounter: Payer: BC Managed Care – PPO | Admitting: Internal Medicine

## 2012-04-08 ENCOUNTER — Ambulatory Visit (INDEPENDENT_AMBULATORY_CARE_PROVIDER_SITE_OTHER): Payer: BC Managed Care – PPO | Admitting: Internal Medicine

## 2012-04-08 ENCOUNTER — Encounter: Payer: Self-pay | Admitting: Internal Medicine

## 2012-04-08 VITALS — BP 160/100 | HR 99 | Temp 98.2°F | Wt 220.0 lb

## 2012-04-08 DIAGNOSIS — J019 Acute sinusitis, unspecified: Secondary | ICD-10-CM

## 2012-04-08 MED ORDER — HYDROCODONE-HOMATROPINE 5-1.5 MG/5ML PO SYRP: 5.0000 mL | ORAL_SOLUTION | Freq: Every evening | ORAL | Status: DC | PRN

## 2012-04-08 MED ORDER — CLINDAMYCIN HCL 300 MG PO CAPS: 300.0000 mg | ORAL_CAPSULE | Freq: Three times a day (TID) | ORAL | Status: DC

## 2012-04-08 NOTE — Assessment & Plan Note (Signed)
Seems to have secondary bacterial infection Discussed supportive Rx Clindamycin Hydrocodone for the cough

## 2012-04-08 NOTE — Progress Notes (Signed)
  Subjective:    Patient ID: [name], female    DOB: 1980-08-24, 31 y.o.   MRN: [id number]  HPI Husband got sick first She has sore throat Cough and congestion Now with productive cough---green and yellow especially in AM Cough is keeping her up  Started ~ 8 days ago No fever but has had some cold sweats at night Some SOB--- mainly in Am when full of mucus Thinks the mucus is draining from sinuses No ear pain Some sore throat and laryngitis (voice changes)  Tried mucinex D and tylenol cold (or generic)  Current Outpatient Prescriptions on File Prior to Visit  Medication Sig Dispense Refill  . atenolol (TENORMIN) 25 MG tablet TAKE ONE TABLET BY MOUTH EVERY DAY  30 tablet  11  . hydrochlorothiazide (HYDRODIURIL) 25 MG tablet TAKE ONE TABLET BY MOUTH EVERY DAY  30 tablet  11    Allergies  Allergen Reactions  . Azithromycin Rash  . Cephalosporins Rash    Allergy noted with cefprozil (Cefzil) and cefuroxime (Ceftin)  . Penicillins Rash    Allergy noted with Amoxicillin  . Sulfamethoxazole W-Trimethoprim Swelling and Rash    Past Medical History  Diagnosis Date  . Hyperlipidemia   . Hypertension     Past Surgical History  Procedure Date  . Tonsillectomy   . Tympanostomy tube placement   . Cesarean section 2008  . Laparoscopy 12/01/2011    Procedure: LAPAROSCOPY OPERATIVE;  Surgeon: Leslie Andrea, MD;  Location: [facility];  Service: Gynecology;  Laterality: N/A;  . Esophageal biopsy 12/01/2011    Procedure: BIOPSY;  Surgeon: Leslie Andrea, MD;  Location: [facility];  Service: Gynecology;  Laterality: N/A;    Family History  Problem Relation Age of Onset  . Hypertension Father   . Coronary artery disease Paternal Uncle   . Cancer Paternal Grandfather   . Diabetes Neg Hx     History   Social History  . Marital Status: Married    Spouse Name: N/A    Number of Children: 1  . Years of Education: N/A   Occupational History  . 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 Smoking status: Never Smoker   . Smokeless tobacco: Never Used  . Alcohol Use: Yes  . Drug Use: No  . Sexually Active: Not on file   Other Topics Concern  . Not on file   Social History Narrative  . No narrative on file   Review of Systems No rash No vomiting Slight loose stool Appetite is okay    Objective:   Physical Exam  Constitutional: She appears well-developed and well-nourished. No distress.  HENT:       No sinus tenderness TMs normal Mild pharyngeal injection Moderate nasal inflammation with opaque mucus  Neck: Normal range of motion. Neck supple.       ?slight non tender anterior cervical nodes  Pulmonary/Chest: Effort normal and breath sounds normal. No respiratory distress. She has no wheezes. She has no rales.  Skin: She is not diaphoretic.          Assessment & Plan:

## 2012-04-12 ENCOUNTER — Encounter: Payer: Self-pay | Admitting: Internal Medicine

## 2012-04-23 ENCOUNTER — Encounter: Payer: Self-pay | Admitting: Internal Medicine

## 2012-04-23 NOTE — Telephone Encounter (Signed)
Changes made

## 2012-05-14 ENCOUNTER — Encounter: Payer: BC Managed Care – PPO | Admitting: Internal Medicine

## 2012-05-14 DIAGNOSIS — Z0289 Encounter for other administrative examinations: Secondary | ICD-10-CM

## 2012-06-01 ENCOUNTER — Other Ambulatory Visit: Payer: Self-pay

## 2012-06-04 ENCOUNTER — Encounter (HOSPITAL_COMMUNITY): Payer: Self-pay | Admitting: *Deleted

## 2012-06-04 ENCOUNTER — Inpatient Hospital Stay (HOSPITAL_COMMUNITY)
Admission: AD | Admit: 2012-06-04 | Discharge: 2012-06-04 | Disposition: A | Discharge status: Home / Self Care | Payer: BC Managed Care – PPO | Source: Ambulatory Visit | Attending: Obstetrics and Gynecology | Admitting: Obstetrics and Gynecology

## 2012-06-04 DIAGNOSIS — R1031 Right lower quadrant pain: Secondary | ICD-10-CM

## 2012-06-04 DIAGNOSIS — O99891 Other specified diseases and conditions complicating pregnancy: Secondary | ICD-10-CM | POA: Insufficient documentation

## 2012-06-04 LAB — CBC
HCT: 34.5 % — ABNORMAL LOW (ref 36.0–46.0)
Hemoglobin: 11.8 g/dL — ABNORMAL LOW (ref 12.0–15.0)
MCH: 30.4 pg (ref 26.0–34.0)
MCHC: 34.2 g/dL (ref 30.0–36.0)
MCV: 88.9 fL (ref 78.0–100.0)
Platelets: 329 K/uL (ref 150–400)
RBC: 3.88 MIL/uL (ref 3.87–5.11)
RDW: 12.5 % (ref 11.5–15.5)
WBC: 9.9 K/uL (ref 4.0–10.5)

## 2012-06-04 LAB — URINALYSIS, ROUTINE W REFLEX MICROSCOPIC
Bilirubin Urine: NEGATIVE
Glucose, UA: NEGATIVE mg/dL
Hgb urine dipstick: NEGATIVE
Ketones, ur: NEGATIVE mg/dL
Nitrite: NEGATIVE
Protein, ur: NEGATIVE mg/dL
Specific Gravity, Urine: <1.005 — ABNORMAL LOW (ref 1.005–1.030)
Urobilinogen, UA: 0.2 mg/dL (ref 0.0–1.0)
pH: 5.5 (ref 5.0–8.0)

## 2012-06-04 LAB — URINE MICROSCOPIC-ADD ON

## 2012-06-04 LAB — POCT PREGNANCY, URINE: Preg Test, Ur: POSITIVE — AB

## 2012-06-04 NOTE — MAU Note (Signed)
Patient presents to MAU with c/o intermittent R lower abdominal dull aching/pulsing pain since 1600 today. Reports some cramping on L side; states she has had cramping throughout the pregnancy. Reports BM after lunch with increased pressure and straining. Denies vaginal bleeding.

## 2012-06-04 NOTE — MAU Note (Signed)
Pt reports since 4 pm she has had dull off/on pain in rt side. Pain worsened. Pt os [redacted] weeks pregnant.

## 2012-06-04 NOTE — MAU Provider Note (Signed)
History     CSN: [id number]  Arrival date and time: 06/04/12 1849   None     Chief Complaint  Patient presents with  . Abdominal Pain   HPI  [name] is a 32 y.o. G2P1001 at [redacted]w[redacted]d who presents today with RLQ pain since about 1730 today. She called the triage nurse for her practice and was told to come here. She denies any vaginal bleeding. She has noticed an increased vaginal discharge, but it has not had an odor or been irritating. She has had 2 ultrasounds in the office and she was very early for the first one. The second on showed a pregnancy without any concerns and with cardiac activity.   Past Medical History  Diagnosis Date  . Hyperlipidemia   . Hypertension     Past Surgical History  Procedure Laterality Date  . Tonsillectomy    . Tympanostomy tube placement    . Cesarean section  2008  . Laparoscopy  12/01/2011    Procedure: LAPAROSCOPY OPERATIVE;  Surgeon: Leslie Andrea, MD;  Location: [facility];  Service: Gynecology;  Laterality: N/A;  . Esophageal biopsy  12/01/2011    Procedure: BIOPSY;  Surgeon: Leslie Andrea, MD;  Location: [facility];  Service: Gynecology;  Laterality: N/A;    Family History  Problem Relation Age of Onset  . Hypertension Father   . Coronary artery disease Paternal Uncle   . Cancer Paternal Grandfather   . Diabetes Neg Hx     History  Substance Use Topics  . Smoking status: Never Smoker   . Smokeless tobacco: Never Used  . Alcohol Use: Yes    Allergies:  Allergies  Allergen Reactions  . Azithromycin Rash  . Cephalosporins Rash    Allergy noted with cefprozil (Cefzil) and cefuroxime (Ceftin)  . Penicillins Rash    Allergy noted with Amoxicillin  . Sulfamethoxazole W-Trimethoprim Swelling and Rash    Prescriptions prior to admission  Medication Sig Dispense Refill  . hydrochlorothiazide (HYDRODIURIL) 25 MG tablet TAKE ONE TABLET BY MOUTH EVERY DAY  30 tablet  11  . labetalol (NORMODYNE) 100 MG tablet Take 100 mg by  mouth 2 (two) times daily.      . progesterone (PROMETRIUM) 200 MG capsule Take 200 mg by mouth daily.        Review of Systems  Constitutional: Negative for fever and chills.  Gastrointestinal: Positive for nausea, abdominal pain (RLQ pain) and constipation. Negative for vomiting and diarrhea.  Genitourinary: Negative for dysuria, urgency and frequency.  Musculoskeletal: Negative for myalgias.  Neurological: Positive for dizziness. Negative for headaches.   Physical Exam   Blood pressure 162/91, pulse 87, temperature 98.3 F (36.8 C), temperature source Oral, resp. rate 20, height 5\' 5"  (1.651 m), weight 102.059 kg (225 lb), last menstrual period 11/16/2011, SpO2 100.00%.  Physical Exam  Nursing note and vitals reviewed. Constitutional: She appears well-developed and well-nourished. No distress.  Cardiovascular: Normal rate.   Respiratory: Effort normal.  GI: Soft.    MAU Course  Procedures  Results for orders placed during the hospital encounter of 06/04/12 (from the past 24 hour(s))  URINALYSIS, ROUTINE W REFLEX MICROSCOPIC     Status: Abnormal   Collection Time    06/04/12  7:14 PM      Result Value Range   Color, Urine YELLOW  YELLOW   APPearance CLEAR  CLEAR   Specific Gravity, Urine <1.005 (*) 1.005 - 1.030   pH 5.5  5.0 - 8.0  Glucose, UA NEGATIVE  NEGATIVE mg/dL   Hgb urine dipstick NEGATIVE  NEGATIVE   Bilirubin Urine NEGATIVE  NEGATIVE   Ketones, ur NEGATIVE  NEGATIVE mg/dL   Protein, ur NEGATIVE  NEGATIVE mg/dL   Urobilinogen, UA 0.2  0.0 - 1.0 mg/dL   Nitrite NEGATIVE  NEGATIVE   Leukocytes, UA TRACE (*) NEGATIVE  URINE MICROSCOPIC-ADD ON     Status: None   Collection Time    06/04/12  7:14 PM      Result Value Range   Squamous Epithelial / LPF RARE  RARE   WBC, UA 0-2  <3 WBC/hpf   RBC / HPF 0-2  <3 RBC/hpf  CBC     Status: Abnormal   Collection Time    06/04/12  8:20 PM      Result Value Range   WBC 9.9  4.0 - 10.5 K/uL   RBC 3.88  3.87 - 5.11  MIL/uL   Hemoglobin 11.8 (*) 12.0 - 15.0 g/dL   HCT 29.5 (*) 62.1 - 30.8 %   MCV 88.9  78.0 - 100.0 fL   MCH 30.4  26.0 - 34.0 pg   MCHC 34.2  30.0 - 36.0 g/dL   RDW 65.7  84.6 - 96.2 %   Platelets 329  150 - 400 K/uL  POCT PREGNANCY, URINE     Status: Abnormal   Collection Time    06/04/12  8:25 PM      Result Value Range   Preg Test, Ur POSITIVE (*) NEGATIVE    2138: spoke with Dr. Arelia Sneddon, reviewed the history and physical exam. Pt is OK for dc home.   Assessment and Plan   1. RLQ abdominal pain    Likely related to round ligament pain or constipation FU with PCP as needed or if sx worsen   [name] 06/04/2012, 8:19 PM

## 2012-06-11 LAB — OB RESULTS CONSOLE HIV ANTIBODY (ROUTINE TESTING): HIV: NONREACTIVE

## 2012-06-11 LAB — OB RESULTS CONSOLE HEPATITIS B SURFACE ANTIGEN: Hepatitis B Surface Ag: NEGATIVE

## 2012-06-11 LAB — OB RESULTS CONSOLE ANTIBODY SCREEN: Antibody Screen: NEGATIVE

## 2012-06-11 LAB — OB RESULTS CONSOLE ABO/RH: RH Type: POSITIVE

## 2012-06-11 LAB — OB RESULTS CONSOLE RUBELLA ANTIBODY, IGM: Rubella: IMMUNE

## 2012-06-11 LAB — OB RESULTS CONSOLE RPR: RPR: NONREACTIVE

## 2012-06-13 ENCOUNTER — Encounter: Payer: Self-pay | Admitting: Internal Medicine

## 2012-06-13 NOTE — Telephone Encounter (Signed)
This note is about the pt's daughter not her

## 2012-06-30 ENCOUNTER — Encounter (HOSPITAL_COMMUNITY): Payer: Self-pay | Admitting: Advanced Practice Midwife

## 2012-06-30 ENCOUNTER — Inpatient Hospital Stay (HOSPITAL_COMMUNITY)
Admission: AD | Admit: 2012-06-30 | Discharge: 2012-06-30 | Disposition: A | Discharge status: Home / Self Care | Payer: BC Managed Care – PPO | Source: Ambulatory Visit | Attending: Emergency Medicine | Admitting: Emergency Medicine

## 2012-06-30 DIAGNOSIS — R519 Headache, unspecified: Secondary | ICD-10-CM

## 2012-06-30 DIAGNOSIS — O10912 Unspecified pre-existing hypertension complicating pregnancy, second trimester: Secondary | ICD-10-CM

## 2012-06-30 DIAGNOSIS — R51 Headache: Secondary | ICD-10-CM

## 2012-06-30 DIAGNOSIS — Z3490 Encounter for supervision of normal pregnancy, unspecified, unspecified trimester: Secondary | ICD-10-CM

## 2012-06-30 DIAGNOSIS — O10019 Pre-existing essential hypertension complicating pregnancy, unspecified trimester: Secondary | ICD-10-CM | POA: Insufficient documentation

## 2012-06-30 LAB — URINE MICROSCOPIC-ADD ON

## 2012-06-30 LAB — URINALYSIS, ROUTINE W REFLEX MICROSCOPIC
Bilirubin Urine: NEGATIVE
Glucose, UA: NEGATIVE mg/dL
Hgb urine dipstick: NEGATIVE
Ketones, ur: 15 mg/dL — AB
Nitrite: NEGATIVE
Protein, ur: NEGATIVE mg/dL
Specific Gravity, Urine: 1.02 (ref 1.005–1.030)
Urobilinogen, UA: 0.2 mg/dL (ref 0.0–1.0)
pH: 6.5 (ref 5.0–8.0)

## 2012-06-30 MED ORDER — PROCHLORPERAZINE EDISYLATE 5 MG/ML IJ SOLN
10.0000 mg | Freq: Once | INTRAMUSCULAR | Status: AC
  Administered 2012-06-30: 10 mg via INTRAVENOUS
  Filled 2012-06-30: qty 2

## 2012-06-30 MED ORDER — ONDANSETRON HCL 4 MG/2ML IJ SOLN
4.0000 mg | Freq: Once | INTRAMUSCULAR | Status: AC
  Administered 2012-06-30: 4 mg via INTRAVENOUS
  Filled 2012-06-30: qty 2

## 2012-06-30 MED ORDER — METOCLOPRAMIDE HCL 5 MG/ML IJ SOLN
20.0000 mg | Freq: Once | INTRAVENOUS | Status: AC
  Administered 2012-06-30: 20 mg via INTRAVENOUS
  Filled 2012-06-30: qty 4

## 2012-06-30 MED ORDER — METHYLDOPA 250 MG PO TABS: 500.0000 mg | ORAL_TABLET | Freq: Three times a day (TID) | ORAL | Status: DC

## 2012-06-30 MED ORDER — METOCLOPRAMIDE HCL 10 MG PO TABS: 10.0000 mg | ORAL_TABLET | Freq: Four times a day (QID) | ORAL | Status: DC | PRN

## 2012-06-30 MED ORDER — HYDROMORPHONE HCL PF 1 MG/ML IJ SOLN
1.0000 mg | Freq: Once | INTRAMUSCULAR | Status: AC
  Administered 2012-06-30: 1 mg via INTRAVENOUS
  Filled 2012-06-30: qty 1

## 2012-06-30 MED ORDER — MORPHINE SULFATE 4 MG/ML IJ SOLN
8.0000 mg | Freq: Once | INTRAMUSCULAR | Status: AC
  Administered 2012-06-30: 8 mg via INTRAVENOUS
  Filled 2012-06-30: qty 2

## 2012-06-30 MED ORDER — METHYLDOPATE HCL 250 MG/5ML IV SOLN: 250.0000 mg | Freq: Once | INTRAVENOUS | Status: DC

## 2012-06-30 MED ORDER — TERBUTALINE SULFATE 1 MG/ML IJ SOLN: 0.2500 mg | Freq: Once | INTRAMUSCULAR | Status: DC

## 2012-06-30 MED ORDER — DEXAMETHASONE SODIUM PHOSPHATE 10 MG/ML IJ SOLN
10.0000 mg | Freq: Once | INTRAMUSCULAR | Status: AC
  Administered 2012-06-30: 10 mg via INTRAVENOUS
  Filled 2012-06-30: qty 1

## 2012-06-30 MED ORDER — DIPHENHYDRAMINE HCL 50 MG/ML IJ SOLN
50.0000 mg | Freq: Once | INTRAMUSCULAR | Status: AC
  Administered 2012-06-30: 50 mg via INTRAVENOUS
  Filled 2012-06-30: qty 1

## 2012-06-30 MED ORDER — HYDRALAZINE HCL 20 MG/ML IJ SOLN
10.0000 mg | Freq: Once | INTRAMUSCULAR | Status: AC
  Administered 2012-06-30: 10 mg via INTRAVENOUS
  Filled 2012-06-30: qty 1

## 2012-06-30 MED ORDER — DIPHENHYDRAMINE HCL 50 MG/ML IJ SOLN
25.0000 mg | Freq: Once | INTRAMUSCULAR | Status: AC
  Administered 2012-06-30: 25 mg via INTRAVENOUS
  Filled 2012-06-30: qty 1

## 2012-06-30 MED ORDER — LACTATED RINGERS IV SOLN
INTRAVENOUS | Status: DC
  Administered 2012-06-30 (×2): via INTRAVENOUS

## 2012-06-30 MED ORDER — OXYCODONE-ACETAMINOPHEN 5-325 MG PO TABS: 2.0000 | ORAL_TABLET | ORAL | Status: DC | PRN

## 2012-06-30 MED ORDER — METHYLDOPA 250 MG PO TABS
250.0000 mg | ORAL_TABLET | Freq: Once | ORAL | Status: AC
  Administered 2012-06-30: 250 mg via ORAL
  Filled 2012-06-30: qty 1

## 2012-06-30 NOTE — ED Notes (Signed)
Pre-eclampsia with last pregnancy leading to induction 2 weeks early.  Pt alert and oriented and in NAD.  Pt ambulated to restroom without assistance and without difficulty.

## 2012-06-30 NOTE — ED Notes (Signed)
Pt is transfer from [facility] for further workup of Headache.

## 2012-06-30 NOTE — MAU Note (Signed)
Report called to Stephanie Coup at [facility] ED. [facility] notified and available for transport.

## 2012-06-30 NOTE — MAU Note (Signed)
Pt presents with complaints of headache for the past 3 days that has not been relieved by tylenol. She has a history of high blood pressure and takes medications for it that was changed approx 2 weeks ago by Dr Arelia Sneddon.

## 2012-06-30 NOTE — MAU Note (Signed)
Patient presents to MAU with c/o headache x 4 days; states she awoke in middle of night and headache was unbearable; unrelieved by Tylenol.  Reports her BP meds were changed on 3/5. Reports she has not taken anything for h/a today.  Denies vaginal bleeding, LOF or cramping.

## 2012-06-30 NOTE — MAU Provider Note (Signed)
Chief Complaint: Headache and Hypertension   First Provider Initiated Contact with Patient 06/30/12 1203     SUBJECTIVE HPI: [name] is a 32 y.o. G2P1001 at [redacted]w[redacted]d by LMP who presents with HA x 3 days not been relieved by tylenol and hypertension--BPs 170-190's/100-110's st home. Hx CHTN, meds changed to Aldomet 250 BID two weeks ago. Taking meds as directed. Took dose this morning.   Describes HA at constant, throbbing frontal and on top of head, same as previous HA's when BP was very elevated, but much worse, 9/10. No relief w/ rest, dark room, PO fluids.   Past Medical History  Diagnosis Date  . Hyperlipidemia   . Hypertension    OB History   Grav Para Term Preterm Abortions TAB SAB Ect Mult Living   2 1 1       1      # Outc Date GA Lbr Len/2nd Wgt Sex Del Anes PTL Lv   1 TRM 2008 [redacted]w[redacted]d   F LTCS Spinal No Yes   Comments:     2 CUR              Past Surgical History  Procedure Laterality Date  . Tonsillectomy    . Tympanostomy tube placement    . Cesarean section  2008  . Laparoscopy  12/01/2011    Procedure: LAPAROSCOPY OPERATIVE;  Surgeon: Leslie Andrea, MD;  Location: [facility];  Service: Gynecology;  Laterality: N/A;  . Esophageal biopsy  12/01/2011    Procedure: BIOPSY;  Surgeon: Leslie Andrea, MD;  Location: [facility];  Service: Gynecology;  Laterality: N/A;   History   Social History  . Marital Status: Married    Spouse Name: N/A    Number of Children: 1  . Years of Education: N/A   Occupational History  . 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 Smoking status: Never Smoker   . Smokeless tobacco: Never Used  . Alcohol Use: Yes  . Drug Use: No  . Sexually Active: Not on file   Other Topics Concern  . Not on file   Social History Narrative  . No narrative on file   No current facility-administered medications on file prior to encounter.    Current Outpatient Prescriptions on File Prior to Encounter  Medication Sig Dispense Refill  . labetalol (NORMODYNE) 100 MG tablet Take 100 mg by mouth 2 (two) times daily.      . ondansetron (ZOFRAN-ODT) 4 MG disintegrating tablet Take 4 mg by mouth every 8 (eight) hours as needed for nausea.      . Prenatal Vit-Fe Fumarate-FA (PRENATAL MULTIVITAMIN) TABS Take 1 tablet by mouth daily.      . progesterone (PROMETRIUM) 200 MG capsule Take 200 mg by mouth daily.      . promethazine (PHENERGAN) 25 MG tablet Take 25 mg by mouth every 6 (six) hours as needed for nausea.       Allergies  Allergen Reactions  . Azithromycin Rash  . Cephalosporins Rash    Allergy noted with cefprozil (Cefzil) and cefuroxime (Ceftin)  . Penicillins Rash    Allergy noted with Amoxicillin  . Sulfamethoxazole W-Trimethoprim Swelling and Rash    ROS: Pos for HA, , decreased appetite. Neg for N/V, fever, chills, neck pain, URI, allergies, weakness, paralysis, difficulty w/ speech or gait, VB. LOF, vaginal discharge,  chest pain, SOB  OBJECTIVE Blood pressure 189/117, pulse 95, temperature 98 F (36.7 C), resp. rate 18, last menstrual period 11/16/2011.  Patient Vitals for the past 24 hrs:  BP Temp Temp src Pulse Resp  06/30/12 1600 125/66 mmHg - - 84 -  06/30/12 1545 134/76 mmHg - - 85 -  06/30/12 1530 122/62 mmHg - - 85 -  06/30/12 1515 128/76 mmHg - - 98 -  06/30/12 1400 153/93 mmHg - - 92 -  06/30/12 1345 150/83 mmHg - - 91 -  06/30/12 1330 159/89 mmHg - - 91 -  06/30/12 1315 152/93 mmHg - - 94 -  06/30/12 1300 156/98 mmHg - - 101 -  06/30/12 1245 156/90 mmHg - - 86 -  06/30/12 1230 160/103 mmHg - - 85 -  06/30/12 1215 167/106 mmHg - - 93 -  06/30/12 1205 166/117 mmHg - - 117 -  06/30/12 1154 159/96 mmHg - - 87 -  06/30/12 1139 189/117 mmHg 98 F (36.7 C) Oral 95 18   GENERAL: Well-developed, well-nourished female in mild-moderate distress.  HEENT: Normocephalic, no frontal or maxillary sinus  tenderness., PEERLA. HEART: normal rate and rhythm RESP: normal effort ABDOMEN: Soft, non-tender EXTREMITIES: Nontender, no edema NEURO: Alert and oriented SPECULUM EXAM: Deferred FHR: 155 by doppler  LAB RESULTS Results for orders placed during the hospital encounter of 06/30/12 (from the past 24 hour(s))  URINALYSIS, ROUTINE W REFLEX MICROSCOPIC     Status: Abnormal   Collection Time    06/30/12 11:40 AM      Result Value Range   Color, Urine YELLOW  YELLOW   APPearance CLEAR  CLEAR   Specific Gravity, Urine 1.020  1.005 - 1.030   pH 6.5  5.0 - 8.0   Glucose, UA NEGATIVE  NEGATIVE mg/dL   Hgb urine dipstick NEGATIVE  NEGATIVE   Bilirubin Urine NEGATIVE  NEGATIVE   Ketones, ur 15 (*) NEGATIVE mg/dL   Protein, ur NEGATIVE  NEGATIVE mg/dL   Urobilinogen, UA 0.2  0.0 - 1.0 mg/dL   Nitrite NEGATIVE  NEGATIVE   Leukocytes, UA TRACE (*) NEGATIVE  URINE MICROSCOPIC-ADD ON     Status: Abnormal   Collection Time    06/30/12 11:40 AM      Result Value Range   Squamous Epithelial / LPF MANY (*) RARE   WBC, UA 3-6  <3 WBC/hpf   Urine-Other MUCOUS PRESENT      IMAGING No results found.  MAU COURSE 1220: IV aprensoline 10mg , PO Aldomet 250 mg, dilaudid 1 mg ordered per Dr.McComb.  1415: No improvement in HA. Decadron, Compazine, Benadryl ordered. 1500: No improvement in HA. N/V now present. Zofran ordered. Dr. Arelia Sneddon called, nortified of no relief of HA w/ multiple meds, IV fluids. New onset N/V. Will come see pt. Plan to transfer to [facility] ED for neuro eval and imaging.  1615: BP 120/60-70's. No cardiac monitoring needed during transport.   ASSESSMENT 1. Frontal headache   2. Chronic hypertension complicating or reason for care during pregnancy, second trimester    PLAN Transfer to [facility] ED per Dr. Arelia Sneddon.  [place], CNM 06/30/2012  11:55 AM

## 2012-06-30 NOTE — ED Provider Notes (Signed)
History     CSN: [id number]  Arrival date & time 06/30/12  1127   First MD Initiated Contact with Patient 06/30/12 1650      Chief Complaint  Patient presents with  . Headache  . Hypertension    (Consider location/radiation/quality/duration/timing/severity/associated sxs/prior treatment) HPI This 32 year old female is 13 weeks estimated gestational age and her pregnancy and has a few days gradual onset throbbing headache which has not maximal intensity at its onset and was not severe at its onset but gradually built up over hours, she is no associated symptoms such as fevers nausea vomiting chest pain shortness of breath or change in speech vision swallowing or understanding or focal or lateralizing weakness or numbness or incoordination, she was seen at [facility] hospital or exudate and treated with a combination of blood pressure medicines as well as Benadryl Compazine and Dilaudid and Decadron with minimal improvement of her headache so was sent to the emergency department for further evaluation. She does have history of hypertension and has been taking her blood pressure medicines. Her headache is still moderately severe after spending several hours in the [facility] hospital. Past Medical History  Diagnosis Date  . Hyperlipidemia   . Hypertension     Past Surgical History  Procedure Laterality Date  . Tonsillectomy    . Tympanostomy tube placement    . Cesarean section  2008  . Laparoscopy  12/01/2011    Procedure: LAPAROSCOPY OPERATIVE;  Surgeon: Leslie Andrea, MD;  Location: [facility];  Service: Gynecology;  Laterality: N/A;  . Esophageal biopsy  12/01/2011    Procedure: BIOPSY;  Surgeon: Leslie Andrea, MD;  Location: [facility];  Service: Gynecology;  Laterality: N/A;    Family History  Problem Relation Age of Onset  . Hypertension Father   . Coronary artery disease Paternal Uncle   . Cancer Paternal Grandfather   . Diabetes Neg Hx     History  Substance Use Topics  .  Smoking status: Never Smoker   . Smokeless tobacco: Never Used  . Alcohol Use: Yes    OB History   Grav Para Term Preterm Abortions TAB SAB Ect Mult Living   2 1 1       1       Review of Systems 10 Systems reviewed and are negative for acute change except as noted in the HPI. Allergies  Azithromycin; Cephalosporins; Penicillins; and Sulfamethoxazole w-trimethoprim  Home Medications   Current Outpatient Rx  Name  Route  Sig  Dispense  Refill  . acetaminophen (TYLENOL) 500 MG tablet   Oral   Take 1,000 mg by mouth every 6 (six) hours as needed for pain.         . flintstones complete (FLINTSTONES) 60 MG chewable tablet   Oral   Chew 2 tablets by mouth daily.         . methyldopa (ALDOMET) 250 MG tablet   Oral   Take 2 tablets (500 mg total) by mouth 3 (three) times daily.           BP 130/76  Pulse 88  Temp(Src) 98 F (36.7 C) (Oral)  Resp 20  SpO2 99%  LMP 11/16/2011  Physical Exam  Nursing note and vitals reviewed. Constitutional:  Awake, alert, nontoxic appearance with baseline speech for patient.  HENT:  Head: Atraumatic.  Mouth/Throat: No oropharyngeal exudate.  Eyes: EOM are normal. Pupils are equal, round, and reactive to light. Right eye exhibits no discharge. Left eye exhibits no discharge.  Neck: Neck supple.  Cardiovascular: Normal rate and regular rhythm.   No murmur heard. Pulmonary/Chest: Effort normal and breath sounds normal. No stridor. No respiratory distress. She has no wheezes. She has no rales. She exhibits no tenderness.  Abdominal: Soft. Bowel sounds are normal. She exhibits no mass. There is no tenderness. There is no rebound.  Musculoskeletal: She exhibits no tenderness.  Baseline ROM, moves extremities with no obvious new focal weakness.  Lymphadenopathy:    She has no cervical adenopathy.  Neurological:  Awake, alert, cooperative and aware of situation; motor strength bilaterally; sensation normal to light touch bilaterally;  peripheral visual fields full to confrontation; no facial asymmetry; tongue midline; major cranial nerves appear intact; no pronator drift, normal finger to nose bilaterally, baseline gait without new ataxia.  Skin: No rash noted.  Psychiatric: She has a normal mood and affect.    ED Course  Procedures (including critical care time) Patient had fetal heart rate 155 today at [facility] hospital .  Feels much better headache almost imperceptible.2000 Labs Reviewed  URINALYSIS, ROUTINE W REFLEX MICROSCOPIC - Abnormal; Notable for the following:    Ketones, ur 15 (*)    Leukocytes, UA TRACE (*)    All other components within normal limits  URINE MICROSCOPIC-ADD ON - Abnormal; Notable for the following:    Squamous Epithelial / LPF MANY (*)    All other components within normal limits   No results found.   1. Frontal headache   2. Chronic hypertension complicating or reason for care during pregnancy, second trimester       MDM  I doubt any other EMC precluding discharge at this time including, but not necessarily limited to the following:SAH, SBI, CVA.Hurman Horn, MD 07/01/12 [phone]

## 2012-07-03 ENCOUNTER — Encounter (HOSPITAL_COMMUNITY): Payer: Self-pay | Admitting: *Deleted

## 2012-07-03 ENCOUNTER — Inpatient Hospital Stay (HOSPITAL_COMMUNITY)
Admission: AD | Admit: 2012-07-03 | Discharge: 2012-07-03 | Disposition: A | Discharge status: Home / Self Care | Payer: BC Managed Care – PPO | Source: Ambulatory Visit | Attending: Obstetrics and Gynecology | Admitting: Obstetrics and Gynecology

## 2012-07-03 DIAGNOSIS — O10012 Pre-existing essential hypertension complicating pregnancy, second trimester: Secondary | ICD-10-CM

## 2012-07-03 DIAGNOSIS — O10019 Pre-existing essential hypertension complicating pregnancy, unspecified trimester: Secondary | ICD-10-CM | POA: Insufficient documentation

## 2012-07-03 DIAGNOSIS — O99891 Other specified diseases and conditions complicating pregnancy: Secondary | ICD-10-CM | POA: Insufficient documentation

## 2012-07-03 DIAGNOSIS — R109 Unspecified abdominal pain: Secondary | ICD-10-CM | POA: Insufficient documentation

## 2012-07-03 DIAGNOSIS — R51 Headache: Secondary | ICD-10-CM | POA: Insufficient documentation

## 2012-07-03 LAB — URINALYSIS, ROUTINE W REFLEX MICROSCOPIC
Bilirubin Urine: NEGATIVE
Glucose, UA: NEGATIVE mg/dL
Hgb urine dipstick: NEGATIVE
Ketones, ur: NEGATIVE mg/dL
Leukocytes, UA: NEGATIVE
Nitrite: NEGATIVE
Protein, ur: NEGATIVE mg/dL
Specific Gravity, Urine: <1.005 — ABNORMAL LOW (ref 1.005–1.030)
Urobilinogen, UA: 0.2 mg/dL (ref 0.0–1.0)
pH: 5.5 (ref 5.0–8.0)

## 2012-07-03 MED ORDER — OXYCODONE-ACETAMINOPHEN 5-325 MG PO TABS
2.0000 | ORAL_TABLET | Freq: Once | ORAL | Status: AC
  Administered 2012-07-03: 2 via ORAL
  Filled 2012-07-03: qty 2

## 2012-07-03 NOTE — MAU Note (Addendum)
PT SAYS SHE WAS HERE ON Sunday  FOR BP AND H/A-   WAS SENT TO [facility]- NO PROBLEMS- WENT HOME.  SAW DR [facility] ON Monday  142/96.  INCREASED MED- ALDOMET . MADE APPOINTMENT FOR  3-25-  .  WAS OFF WORK - WENT BACK  TONIGHT.   AS NIGHT PROGRESSED  - NECK STIFF- BASE OF HEAD HURTS.  A FIREMAN TOOK BP-  176/138.  SO SHE CALLED ON CALL- TOLD TO COME HERE.    WHILE AT [facility] - GAVE HER RX FOR REGLAN AND PERCOCET    DID NOT  TAKE TONIGHT-- WAS TAKEN LAST ON Monday-  WITH GOOD RELIEF.

## 2012-07-03 NOTE — MAU Provider Note (Signed)
History     CSN: [id number]  Arrival date and time: 07/03/12 0045   First Provider Initiated Contact with Patient 07/03/12 0159      No chief complaint on file.  HPI [name] is a 32 y.o. G2P1001 at [redacted]w[redacted]d who presents to MAU today with complaint of headache and HTN. The patient has chronic HTN and is on Aldomet 500 mg BID. The patient was seen here on Sunday and evaluated and then sent to [facility] for further evaluation of the headache. The ED physician did a neurological exam and concluded that there was no emergent neurological reason for the headache. No imaging was performed. The patient was then seen in the office on Monday for follow-up. She has been given Percocet and Reglan to help with her headaches and has noticed some improvement with these medication although she is not ever symptom free. Tonight she went into work at [employer] and state she "hasn't felt right." She had 4-5 episodes of loose stools and headache has progressively gotten worse throughout the night. 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 They suggested she come in for evaluation and then she called the triage person for the office and they also suggested she come in for evaluation. She rates her headache at 9-10/10 now. The last time she took any pain medication was on Monday. She states that she did not take any Tuesday because she had to go into work that night. She states that she occasionally sees spots, but denies blurred vision or changes in vision. She has occasional minimal peripheral edema, mostly in her hands. She states that she has had some lower abdominal discomfort today from the diarrhea, but denies RUQ pain. She also denies vaginal bleeding, vaginal discharge, LOF, contractions or lower abdominal pressure.   OB History   Grav Para Term Preterm Abortions TAB SAB Ect Mult Living   2 1 1       1       Past  Medical History  Diagnosis Date  . Hyperlipidemia   . Hypertension     Past Surgical History  Procedure Laterality Date  . Tonsillectomy    . Tympanostomy tube placement    . Cesarean section  2008  . Laparoscopy  12/01/2011    Procedure: LAPAROSCOPY OPERATIVE;  Surgeon: Leslie Andrea, MD;  Location: [facility];  Service: Gynecology;  Laterality: N/A;  . Esophageal biopsy  12/01/2011    Procedure: BIOPSY;  Surgeon: Leslie Andrea, MD;  Location: [facility];  Service: Gynecology;  Laterality: N/A;    Family History  Problem Relation Age of Onset  . Hypertension Father   . Coronary artery disease Paternal Uncle   . Cancer Paternal Grandfather   . Diabetes Neg Hx     History  Substance Use Topics  . Smoking status: Never Smoker   . Smokeless tobacco: Never Used  . Alcohol Use: No    Allergies:  Allergies  Allergen Reactions  . Azithromycin Rash  . Cephalosporins Rash    Allergy noted with cefprozil (Cefzil) and cefuroxime (Ceftin)  . Penicillins Rash    Allergy noted with Amoxicillin  . Sulfamethoxazole W-Trimethoprim Swelling and Rash    Prescriptions prior to admission  Medication Sig Dispense Refill  . flintstones complete (FLINTSTONES) 60 MG chewable tablet Chew 2 tablets by mouth daily.      . methyldopa (ALDOMET)  250 MG tablet Take 2 tablets (500 mg total) by mouth 3 (three) times daily.      Marland Kitchen acetaminophen (TYLENOL) 500 MG tablet Take 1,000 mg by mouth every 6 (six) hours as needed for pain.      Marland Kitchen metoCLOPramide (REGLAN) 10 MG tablet Take 1 tablet (10 mg total) by mouth every 6 (six) hours as needed (nausea/headache).  6 tablet  0  . oxyCODONE-acetaminophen (PERCOCET) 5-325 MG per tablet Take 2 tablets by mouth every 4 (four) hours as needed for pain.  6 tablet  0    Review of Systems  Constitutional: Negative for fever and malaise/fatigue.  Eyes: Negative for blurred vision and double vision.       + spots  Gastrointestinal: Positive for abdominal pain  and diarrhea. Negative for nausea, vomiting and constipation.  Genitourinary: Negative for dysuria, urgency and frequency.       Neg - vaginal bleeding Neg - vaginal discharge  Neurological: Positive for headaches. Negative for dizziness and loss of consciousness.   Physical Exam   Blood pressure 155/99, pulse 98, temperature 98.2 F (36.8 C), temperature source Oral, resp. rate 20, height 5\' 5"  (1.651 m), weight 222 lb 2 oz (100.755 kg), last menstrual period 11/16/2011.  Physical Exam  Constitutional: She is oriented to person, place, and time. She appears well-developed and well-nourished. No distress.  HENT:  Head: Normocephalic and atraumatic.  Cardiovascular: Normal rate, regular rhythm and normal heart sounds.   Respiratory: Effort normal. No respiratory distress.  GI: Soft. Bowel sounds are normal. She exhibits no distension and no mass. There is tenderness (diffuse discomfort not worse with palpation). There is no rebound and no guarding.  Neurological: She is alert and oriented to person, place, and time. She displays normal reflexes.  No clonus  Skin: Skin is warm and dry. No erythema.  Psychiatric: She has a normal mood and affect.   Results for orders placed during the hospital encounter of 07/03/12 (from the past 24 hour(s))  URINALYSIS, ROUTINE W REFLEX MICROSCOPIC     Status: Abnormal   Collection Time    07/03/12  2:15 AM      Result Value Range   Color, Urine YELLOW  YELLOW   APPearance CLEAR  CLEAR   Specific Gravity, Urine <1.005 (*) 1.005 - 1.030   pH 5.5  5.0 - 8.0   Glucose, UA NEGATIVE  NEGATIVE mg/dL   Hgb urine dipstick NEGATIVE  NEGATIVE   Bilirubin Urine NEGATIVE  NEGATIVE   Ketones, ur NEGATIVE  NEGATIVE mg/dL   Protein, ur NEGATIVE  NEGATIVE mg/dL   Urobilinogen, UA 0.2  0.0 - 1.0 mg/dL   Nitrite NEGATIVE  NEGATIVE   Leukocytes, UA NEGATIVE  NEGATIVE   Blood pressures: 155/99 127/89 124/75 120/64  MAU Course   Procedures None  MDM Discussed patient initial intake information with Dr. Marcelle Overlie. He would like to monitor pain and BP x 1 hour after percocet is given.  2130 - Patient now rates pain at 7/10. BP much improved since arrival in MAU.  Discussed patient with Dr. Marcelle Overlie. He would like patient to call to be seen in the office today to re-evaluate medications and management.   Assessment and Plan  A: Chronic hypertension, antepartum, second trimester  P: Discharge home Patient encouraged to call the office when it opens today for appointment to be seen today Patient may continue to take previously prescribed medications PRN for headache and continue on Aldomet as prescribed until further evaluation is complete  Return to MAU as needed or if her condition were to change or worsen  Freddi Starr, PA-C 07/03/2012, 2:02 AM

## 2012-08-15 ENCOUNTER — Inpatient Hospital Stay (HOSPITAL_COMMUNITY)
Admission: AD | Admit: 2012-08-15 | Discharge: 2012-08-16 | Disposition: A | Discharge status: Home / Self Care | Payer: BC Managed Care – PPO | Source: Ambulatory Visit | Attending: Obstetrics and Gynecology | Admitting: Obstetrics and Gynecology

## 2012-08-15 ENCOUNTER — Encounter (HOSPITAL_COMMUNITY): Payer: Self-pay | Admitting: *Deleted

## 2012-08-15 DIAGNOSIS — O99891 Other specified diseases and conditions complicating pregnancy: Secondary | ICD-10-CM | POA: Insufficient documentation

## 2012-08-15 DIAGNOSIS — O26899 Other specified pregnancy related conditions, unspecified trimester: Secondary | ICD-10-CM

## 2012-08-15 DIAGNOSIS — R1084 Generalized abdominal pain: Secondary | ICD-10-CM

## 2012-08-15 DIAGNOSIS — R1013 Epigastric pain: Secondary | ICD-10-CM | POA: Insufficient documentation

## 2012-08-15 DIAGNOSIS — O9989 Other specified diseases and conditions complicating pregnancy, childbirth and the puerperium: Secondary | ICD-10-CM

## 2012-08-15 LAB — COMPREHENSIVE METABOLIC PANEL
ALT: 14 U/L (ref 0–35)
AST: 15 U/L (ref 0–37)
Albumin: 3.2 g/dL — ABNORMAL LOW (ref 3.5–5.2)
Alkaline Phosphatase: 44 U/L (ref 39–117)
BUN: 6 mg/dL (ref 6–23)
CO2: 22 mEq/L (ref 19–32)
Calcium: 9.5 mg/dL (ref 8.4–10.5)
Chloride: 101 mEq/L (ref 96–112)
Creatinine, Ser: 0.53 mg/dL (ref 0.50–1.10)
GFR calc Af Amer: >90 mL/min (ref >90)
GFR calc non Af Amer: >90 mL/min (ref >90)
Glucose, Bld: 85 mg/dL (ref 70–99)
Potassium: 3.5 mEq/L (ref 3.5–5.1)
Sodium: 135 mEq/L (ref 135–145)
Total Bilirubin: 0.3 mg/dL (ref 0.3–1.2)
Total Protein: 7.1 g/dL (ref 6.0–8.3)

## 2012-08-15 LAB — CBC
HCT: 30.8 % — ABNORMAL LOW (ref 36.0–46.0)
Hemoglobin: 10.5 g/dL — ABNORMAL LOW (ref 12.0–15.0)
MCH: 29.8 pg (ref 26.0–34.0)
MCHC: 34.1 g/dL (ref 30.0–36.0)
MCV: 87.5 fL (ref 78.0–100.0)
Platelets: 246 10*3/uL (ref 150–400)
RBC: 3.52 MIL/uL — ABNORMAL LOW (ref 3.87–5.11)
RDW: 13.3 % (ref 11.5–15.5)
WBC: 9 10*3/uL (ref 4.0–10.5)

## 2012-08-15 LAB — AMYLASE: Amylase: 50 U/L (ref 0–105)

## 2012-08-15 LAB — LIPASE, BLOOD: Lipase: 28 U/L (ref 11–59)

## 2012-08-15 MED ORDER — GI COCKTAIL ~~LOC~~
30.0000 mL | Freq: Once | ORAL | Status: AC
  Administered 2012-08-15: 30 mL via ORAL
  Filled 2012-08-15: qty 30

## 2012-08-15 NOTE — MAU Note (Addendum)
PT SAYS SHE HAS UPPER ABD PAIN-  STARTED ON WED- COMES/ GOES-      NEVER HAPPENED BEFORE.   TODAY IT'S CONTINUED- LAYED DOWN TODAY- NO BETTER.   SHE CALLED OFFICE - TOLD HER TO COME IN.  UPPER ABD PAIN- STILL FEELS SAME.  NO VOMITING. SOME NAUSEA.. NO DIARRHEA.     LAST ATE AT  5 PM -   PORK CHOPS , CON ON COB .  BP ELEVATED  IN TRIAGE-  TAKES BP MED.

## 2012-08-15 NOTE — MAU Provider Note (Signed)
History     CSN: [id number]  Arrival date and time: 08/15/12 1954   First Provider Initiated Contact with Patient 08/15/12 2146      Chief Complaint  Patient presents with  . Abdominal Pain  . Hypertension   HPI  [name] is a 32 y.o. G2P1001 at [redacted]w[redacted]d who presents today with epigastric pain. She states that it started yesterday early in the day and has continued. She has not had many fatty foods in the last 2 days. Although she tried to eat chicken nuggets and was unable to 2/2 lack of appetite. She denies any UCs. VB, LOF. She confirms fetal movement.   Past Medical History  Diagnosis Date  . Hyperlipidemia   . Hypertension     Past Surgical History  Procedure Laterality Date  . Tonsillectomy    . Tympanostomy tube placement    . Cesarean section  2008  . Laparoscopy  12/01/2011    Procedure: LAPAROSCOPY OPERATIVE;  Surgeon: Leslie Andrea, MD;  Location: [facility];  Service: Gynecology;  Laterality: N/A;  . Esophageal biopsy  12/01/2011    Procedure: BIOPSY;  Surgeon: Leslie Andrea, MD;  Location: [facility];  Service: Gynecology;  Laterality: N/A;  . Endometrial biopsy      Family History  Problem Relation Age of Onset  . Hypertension Father   . Coronary artery disease Paternal Uncle   . Cancer Paternal Grandfather   . Diabetes Neg Hx     History  Substance Use Topics  . Smoking status: Never Smoker   . Smokeless tobacco: Never Used  . Alcohol Use: No    Allergies:  Allergies  Allergen Reactions  . Azithromycin Rash  . Cephalosporins Rash    Allergy noted with cefprozil (Cefzil) and cefuroxime (Ceftin)  . Penicillins Rash    Allergy noted with Amoxicillin  . Sulfamethoxazole W-Trimethoprim Swelling and Rash    Prescriptions prior to admission  Medication Sig Dispense Refill  . methyldopa (ALDOMET) 250 MG tablet Take 2 tablets (500 mg total) by mouth 3 (three) times daily.      Marland Kitchen acetaminophen (TYLENOL) 500 MG tablet Take 1,000 mg by mouth  every 6 (six) hours as needed for pain.      . flintstones complete (FLINTSTONES) 60 MG chewable tablet Chew 2 tablets by mouth daily.      . metoCLOPramide (REGLAN) 10 MG tablet Take 1 tablet (10 mg total) by mouth every 6 (six) hours as needed (nausea/headache).  6 tablet  0  . oxyCODONE-acetaminophen (PERCOCET) 5-325 MG per tablet Take 2 tablets by mouth every 4 (four) hours as needed for pain.  6 tablet  0    Review of Systems  Constitutional: Negative for fever and chills.  Gastrointestinal: Positive for abdominal pain (epigastic pain). Negative for nausea, vomiting, diarrhea and constipation.  Genitourinary: Negative for dysuria, urgency and frequency.  Musculoskeletal: Negative for myalgias.  Neurological: Negative for dizziness.   Physical Exam   Blood pressure 126/81, pulse 95, temperature 98.3 F (36.8 C), temperature source Oral, resp. rate 18, height 5\' 4"  (1.626 m), weight 99.508 kg (219 lb 6 oz), last menstrual period 11/16/2011.  Physical Exam  Nursing note and vitals reviewed. Constitutional: She is oriented to person, place, and time. She appears well-developed and well-nourished. No distress.  Cardiovascular: Normal rate.   Respiratory: Effort normal.  GI: Soft. There is no tenderness.  Neurological: She is alert and oriented to person, place, and time.  Skin: Skin is warm and  dry.  Psychiatric: She has a normal mood and affect.    MAU Course  Procedures  Results for orders placed during the hospital encounter of 08/15/12 (from the past 24 hour(s))  CBC     Status: Abnormal   Collection Time    08/15/12  9:55 PM      Result Value Range   WBC 9.0  4.0 - 10.5 K/uL   RBC 3.52 (*) 3.87 - 5.11 MIL/uL   Hemoglobin 10.5 (*) 12.0 - 15.0 g/dL   HCT 65.7 (*) 84.6 - 96.2 %   MCV 87.5  78.0 - 100.0 fL   MCH 29.8  26.0 - 34.0 pg   MCHC 34.1  30.0 - 36.0 g/dL   RDW 95.2  84.1 - 32.4 %   Platelets 246  150 - 400 K/uL  COMPREHENSIVE METABOLIC PANEL     Status: Abnormal    Collection Time    08/15/12  9:55 PM      Result Value Range   Sodium 135  135 - 145 mEq/L   Potassium 3.5  3.5 - 5.1 mEq/L   Chloride 101  96 - 112 mEq/L   CO2 22  19 - 32 mEq/L   Glucose, Bld 85  70 - 99 mg/dL   BUN 6  6 - 23 mg/dL   Creatinine, Ser 4.01  0.50 - 1.10 mg/dL   Calcium 9.5  8.4 - 02.7 mg/dL   Total Protein 7.1  6.0 - 8.3 g/dL   Albumin 3.2 (*) 3.5 - 5.2 g/dL   AST 15  0 - 37 U/L   ALT 14  0 - 35 U/L   Alkaline Phosphatase 44  39 - 117 U/L   Total Bilirubin 0.3  0.3 - 1.2 mg/dL   GFR calc non Af Amer >90  >90 mL/min   GFR calc Af Amer >90  >90 mL/min   *RADIOLOGY REPORT*  Clinical Data: Abdominal pain. Hypotension.  LIMITED OBSTETRIC ULTRASOUND  Number of Fetuses: 1  Heart Rate: 156 bpm  Movement: Present  Presentation: Transverse with head maternal right.  Placental Location: Posterior fundal  Previa: None.  Amniotic Fluid (Subjective): Normal.  Vertical Pocket 4.0 cm  BPD: 4.75 cm 20 w 3 d EDC: 12/31/2012  MATERNAL FINDINGS:  Cervix: 3.3 cm.  Uterus/Adnexae: Normal.  IMPRESSION:  Uncomplicated single intrauterine pregnancy.  Recommend followup with non-emergent complete OB 14+ wk US  examination for fetal biometric evaluation and anatomic survey if  not already performed.  Original Report Authenticated By: Andreas Newport, M.D.   2258: patient reports no relief with GI cocktail. Spoke with Dr. Marcelle Overlie: do an ob US to make sure the baby looks ok and then she can be DC home is all looks well.   Assessment and Plan   1. Pregnancy with generalized abdominal pain, antepartum    Danger signs reviewed FU with Primary OB as scheduled.   [name] 08/15/2012, 9:55 PM

## 2012-08-15 NOTE — MAU Note (Signed)
Pt reports intermittent upper abd pain for 2-3 days. Pt reports seeing "floaties" since yesterday. Pt reported having a h/a yesterday, not h/a today.

## 2012-08-16 ENCOUNTER — Inpatient Hospital Stay (HOSPITAL_COMMUNITY): Payer: BC Managed Care – PPO

## 2012-08-21 ENCOUNTER — Encounter: Payer: BC Managed Care – PPO | Admitting: Internal Medicine

## 2012-10-18 ENCOUNTER — Inpatient Hospital Stay (HOSPITAL_COMMUNITY)
Admission: AD | Admit: 2012-10-18 | Discharge: 2012-10-19 | Disposition: A | Discharge status: Home / Self Care | Payer: BC Managed Care – PPO | Source: Ambulatory Visit | Attending: Obstetrics and Gynecology | Admitting: Obstetrics and Gynecology

## 2012-10-18 DIAGNOSIS — O133 Gestational [pregnancy-induced] hypertension without significant proteinuria, third trimester: Secondary | ICD-10-CM

## 2012-10-18 DIAGNOSIS — O26893 Other specified pregnancy related conditions, third trimester: Secondary | ICD-10-CM

## 2012-10-18 DIAGNOSIS — O139 Gestational [pregnancy-induced] hypertension without significant proteinuria, unspecified trimester: Secondary | ICD-10-CM | POA: Insufficient documentation

## 2012-10-18 DIAGNOSIS — R51 Headache: Secondary | ICD-10-CM | POA: Insufficient documentation

## 2012-10-18 DIAGNOSIS — R519 Headache, unspecified: Secondary | ICD-10-CM

## 2012-10-19 ENCOUNTER — Encounter (HOSPITAL_COMMUNITY): Payer: Self-pay | Admitting: *Deleted

## 2012-10-19 DIAGNOSIS — O9989 Other specified diseases and conditions complicating pregnancy, childbirth and the puerperium: Secondary | ICD-10-CM

## 2012-10-19 LAB — COMPREHENSIVE METABOLIC PANEL
ALT: 11 U/L (ref 0–35)
AST: 13 U/L (ref 0–37)
Albumin: 2.8 g/dL — ABNORMAL LOW (ref 3.5–5.2)
Alkaline Phosphatase: 60 U/L (ref 39–117)
BUN: 5 mg/dL — ABNORMAL LOW (ref 6–23)
CO2: 25 mEq/L (ref 19–32)
Calcium: 9.7 mg/dL (ref 8.4–10.5)
Chloride: 99 mEq/L (ref 96–112)
Creatinine, Ser: 0.52 mg/dL (ref 0.50–1.10)
GFR calc Af Amer: >90 mL/min (ref >90)
GFR calc non Af Amer: >90 mL/min (ref >90)
Glucose, Bld: 88 mg/dL (ref 70–99)
Potassium: 4.1 mEq/L (ref 3.5–5.1)
Sodium: 134 mEq/L — ABNORMAL LOW (ref 135–145)
Total Bilirubin: 0.2 mg/dL — ABNORMAL LOW (ref 0.3–1.2)
Total Protein: 6.4 g/dL (ref 6.0–8.3)

## 2012-10-19 LAB — URINALYSIS, ROUTINE W REFLEX MICROSCOPIC
Bilirubin Urine: NEGATIVE
Glucose, UA: NEGATIVE mg/dL
Hgb urine dipstick: NEGATIVE
Ketones, ur: NEGATIVE mg/dL
Nitrite: NEGATIVE
Protein, ur: NEGATIVE mg/dL
Specific Gravity, Urine: 1.02 (ref 1.005–1.030)
Urobilinogen, UA: 0.2 mg/dL (ref 0.0–1.0)
pH: 6 (ref 5.0–8.0)

## 2012-10-19 LAB — CBC
HCT: 29.6 % — ABNORMAL LOW (ref 36.0–46.0)
Hemoglobin: 10.2 g/dL — ABNORMAL LOW (ref 12.0–15.0)
MCH: 30.2 pg (ref 26.0–34.0)
MCHC: 34.5 g/dL (ref 30.0–36.0)
MCV: 87.6 fL (ref 78.0–100.0)
Platelets: 257 10*3/uL (ref 150–400)
RBC: 3.38 MIL/uL — ABNORMAL LOW (ref 3.87–5.11)
RDW: 13.3 % (ref 11.5–15.5)
WBC: 10.9 10*3/uL — ABNORMAL HIGH (ref 4.0–10.5)

## 2012-10-19 LAB — URINE MICROSCOPIC-ADD ON

## 2012-10-19 LAB — URIC ACID: Uric Acid, Serum: 4.2 mg/dL (ref 2.4–7.0)

## 2012-10-19 MED ORDER — OXYCODONE-ACETAMINOPHEN 5-325 MG PO TABS
2.0000 | ORAL_TABLET | ORAL | Status: AC
  Administered 2012-10-19: 2 via ORAL
  Filled 2012-10-19: qty 2

## 2012-10-19 MED ORDER — OXYCODONE-ACETAMINOPHEN 5-325 MG PO TABS: 1.0000 | ORAL_TABLET | ORAL | Status: DC

## 2012-10-19 MED ORDER — PROMETHAZINE HCL 25 MG PO TABS
25.0000 mg | ORAL_TABLET | ORAL | Status: AC
  Administered 2012-10-19: 25 mg via ORAL
  Filled 2012-10-19: qty 1

## 2012-10-19 NOTE — MAU Provider Note (Signed)
Chief Complaint:  Hypertension and Headache   First Provider Initiated Contact with Patient 10/19/12 0057      HPI: [name] is a 32 y.o. G2P1001 at 61w3dwho presents to maternity admissions reporting headache x3 days, worsening today.  She has been on bedrest for elevated BP this pregnancy but did go to her parents house to lie outside by the pool during the day today.  She reports drinking enough water and denies visual disturbances or epigastric pain.  She reports good fetal movement, denies contractions, LOF, vaginal bleeding, vaginal itching/burning, urinary symptoms, dizziness, n/v, or fever/chills.     Past Medical History: Past Medical History  Diagnosis Date  . Hyperlipidemia   . Hypertension     Past obstetric history: OB History   Grav Para Term Preterm Abortions TAB SAB Ect Mult Living   2 1 1       1      # Outc Date GA Lbr Len/2nd Wgt Sex Del Anes PTL Lv   1 TRM 2008 [redacted]w[redacted]d   F LTCS Spinal No Yes   Comments:  pre-e on MAG   2 CUR               Past Surgical History: Past Surgical History  Procedure Laterality Date  . Tonsillectomy    . Tympanostomy tube placement    . Cesarean section  2008  . Laparoscopy  12/01/2011    Procedure: LAPAROSCOPY OPERATIVE;  Surgeon: Leslie Andrea, MD;  Location: [facility];  Service: Gynecology;  Laterality: N/A;  . Esophageal biopsy  12/01/2011    Procedure: BIOPSY;  Surgeon: Leslie Andrea, MD;  Location: [facility];  Service: Gynecology;  Laterality: N/A;  . Endometrial biopsy      Family History: Family History  Problem Relation Age of Onset  . Hypertension Father   . Coronary artery disease Paternal Uncle   . Cancer Paternal Grandfather   . Diabetes Neg Hx     Social History: History  Substance Use Topics  . Smoking status: Never Smoker   . Smokeless tobacco: Never Used  . Alcohol Use: No    Allergies:  Allergies  Allergen Reactions  . Azithromycin Rash  . Cephalosporins Rash    Allergy noted with  cefprozil (Cefzil) and cefuroxime (Ceftin)  . Penicillins Rash    Allergy noted with Amoxicillin  . Sulfamethoxazole W-Trimethoprim Swelling and Rash    Meds:  Prescriptions prior to admission  Medication Sig Dispense Refill  . acetaminophen (TYLENOL) 500 MG tablet Take 1,000 mg by mouth every 6 (six) hours as needed for pain.      Marland Kitchen docusate sodium (COLACE) 100 MG capsule Take 100 mg by mouth 2 (two) times daily.      . ferrous gluconate (FERGON) 325 MG tablet Take 325 mg by mouth daily with breakfast.      . labetalol (NORMODYNE) 100 MG tablet Take 100 mg by mouth 3 (three) times daily.      . methyldopa (ALDOMET) 250 MG tablet Take 500 mg by mouth 2 (two) times daily.      Marland Kitchen oxyCODONE-acetaminophen (PERCOCET) 5-325 MG per tablet Take 2 tablets by mouth every 4 (four) hours as needed for pain.  6 tablet  0  . Prenatal Vit-Fe Fumarate-FA (MULTIVITAMIN-PRENATAL) 27-0.8 MG TABS Take 1 tablet by mouth daily at 12 noon.      . ranitidine (ZANTAC) 150 MG tablet Take 150 mg by mouth daily as needed for heartburn.      . [  DISCONTINUED] methyldopa (ALDOMET) 250 MG tablet Take 2 tablets (500 mg total) by mouth 3 (three) times daily.      . flintstones complete (FLINTSTONES) 60 MG chewable tablet Chew 2 tablets by mouth daily.      . metoCLOPramide (REGLAN) 10 MG tablet Take 1 tablet (10 mg total) by mouth every 6 (six) hours as needed (nausea/headache).  6 tablet  0    ROS: Pertinent findings in history of present illness.  Physical Exam  Blood pressure 131/80, pulse 93, temperature 98.2 F (36.8 C), resp. rate 20, height 5\' 5"  (1.651 m), weight 101.606 kg (224 lb), last menstrual period 11/16/2011, SpO2 99.00%. GENERAL: Well-developed, well-nourished female in no acute distress.  HEENT: normocephalic HEART: normal rate RESP: normal effort ABDOMEN: Soft, non-tender, gravid appropriate for gestational age EXTREMITIES: Nontender, no edema NEURO: alert and oriented     FHT:  Baseline 135 ,  moderate variability, accelerations present, no decelerations Contractions: occasional irritability, no contractions on toco or to palpation   Labs: Results for orders placed during the hospital encounter of 10/18/12 (from the past 24 hour(s))  URINALYSIS, ROUTINE W REFLEX MICROSCOPIC     Status: Abnormal   Collection Time    10/19/12 12:10 AM      Result Value Range   Color, Urine YELLOW  YELLOW   APPearance CLEAR  CLEAR   Specific Gravity, Urine 1.020  1.005 - 1.030   pH 6.0  5.0 - 8.0   Glucose, UA NEGATIVE  NEGATIVE mg/dL   Hgb urine dipstick NEGATIVE  NEGATIVE   Bilirubin Urine NEGATIVE  NEGATIVE   Ketones, ur NEGATIVE  NEGATIVE mg/dL   Protein, ur NEGATIVE  NEGATIVE mg/dL   Urobilinogen, UA 0.2  0.0 - 1.0 mg/dL   Nitrite NEGATIVE  NEGATIVE   Leukocytes, UA TRACE (*) NEGATIVE  URINE MICROSCOPIC-ADD ON     Status: Abnormal   Collection Time    10/19/12 12:10 AM      Result Value Range   Squamous Epithelial / LPF FEW (*) RARE   WBC, UA 3-6  <3 WBC/hpf   RBC / HPF 0-2  <3 RBC/hpf   Bacteria, UA FEW (*) RARE  CBC     Status: Abnormal   Collection Time    10/19/12  1:05 AM      Result Value Range   WBC 10.9 (*) 4.0 - 10.5 K/uL   RBC 3.38 (*) 3.87 - 5.11 MIL/uL   Hemoglobin 10.2 (*) 12.0 - 15.0 g/dL   HCT 40.9 (*) 81.1 - 91.4 %   MCV 87.6  78.0 - 100.0 fL   MCH 30.2  26.0 - 34.0 pg   MCHC 34.5  30.0 - 36.0 g/dL   RDW 78.2  95.6 - 21.3 %   Platelets 257  150 - 400 K/uL  COMPREHENSIVE METABOLIC PANEL     Status: Abnormal   Collection Time    10/19/12  1:05 AM      Result Value Range   Sodium 134 (*) 135 - 145 mEq/L   Potassium 4.1  3.5 - 5.1 mEq/L   Chloride 99  96 - 112 mEq/L   CO2 25  19 - 32 mEq/L   Glucose, Bld 88  70 - 99 mg/dL   BUN 5 (*) 6 - 23 mg/dL   Creatinine, Ser 0.86  0.50 - 1.10 mg/dL   Calcium 9.7  8.4 - 57.8 mg/dL   Total Protein 6.4  6.0 - 8.3 g/dL   Albumin 2.8 (*) 3.5 -  5.2 g/dL   AST 13  0 - 37 U/L   ALT 11  0 - 35 U/L   Alkaline Phosphatase  60  39 - 117 U/L   Total Bilirubin 0.2 (*) 0.3 - 1.2 mg/dL   GFR calc non Af Amer >90  >90 mL/min   GFR calc Af Amer >90  >90 mL/min  URIC ACID     Status: None   Collection Time    10/19/12  1:05 AM      Result Value Range   Uric Acid, Serum 4.2  2.4 - 7.0 mg/dL     Assessment: 1. Headache in pregnancy, antepartum, third trimester   2. Gestational hypertension w/o significant proteinuria in 3rd trimester     Plan: Called Dr Vincente Poli to discuss assessment and findings.  Give 1 additional percocet (pt took one at home) per Dr Vincente Poli.  Pt took first Percocet ~6 hours ago so 2 Percocet given in MAU with Phenergan 25 mg for nausea. Headache mostly resolved with medication. D/C home with preeclampsia precautions F/U in office next week Return to MAU as needed    Sharen Counter Certified Nurse-Midwife 10/19/2012 1:14 AM

## 2012-10-19 NOTE — MAU Note (Signed)
B/P was high at home and have had a headache for about a wk. Dr Henderson Cloud gave me Vicodin and took one about 1900 and never really touched it

## 2012-10-19 NOTE — MAU Note (Signed)
Monitors D/C'd. 

## 2012-10-20 LAB — URINE CULTURE: Colony Count: 2000

## 2012-10-22 ENCOUNTER — Encounter (HOSPITAL_COMMUNITY): Payer: Self-pay | Admitting: *Deleted

## 2012-10-22 ENCOUNTER — Inpatient Hospital Stay (HOSPITAL_COMMUNITY): Payer: BC Managed Care – PPO

## 2012-10-22 ENCOUNTER — Inpatient Hospital Stay (HOSPITAL_COMMUNITY)
Admission: AD | Admit: 2012-10-22 | Discharge: 2012-10-24 | DRG: 886 | Disposition: A | Discharge status: Home / Self Care | Payer: BC Managed Care – PPO | Source: Ambulatory Visit | Attending: Obstetrics and Gynecology | Admitting: Obstetrics and Gynecology

## 2012-10-22 DIAGNOSIS — O34219 Maternal care for unspecified type scar from previous cesarean delivery: Secondary | ICD-10-CM | POA: Diagnosis present

## 2012-10-22 DIAGNOSIS — O10019 Pre-existing essential hypertension complicating pregnancy, unspecified trimester: Principal | ICD-10-CM | POA: Diagnosis present

## 2012-10-22 DIAGNOSIS — R51 Headache: Secondary | ICD-10-CM | POA: Diagnosis present

## 2012-10-22 DIAGNOSIS — R9389 Abnormal findings on diagnostic imaging of other specified body structures: Secondary | ICD-10-CM | POA: Diagnosis present

## 2012-10-22 HISTORY — DX: Gestational (pregnancy-induced) hypertension without significant proteinuria, unspecified trimester: O13.9

## 2012-10-22 LAB — COMPREHENSIVE METABOLIC PANEL
ALT: 10 U/L (ref 0–35)
AST: 10 U/L (ref 0–37)
Albumin: 2.9 g/dL — ABNORMAL LOW (ref 3.5–5.2)
Alkaline Phosphatase: 62 U/L (ref 39–117)
BUN: 5 mg/dL — ABNORMAL LOW (ref 6–23)
CO2: 23 mEq/L (ref 19–32)
Calcium: 9.6 mg/dL (ref 8.4–10.5)
Chloride: 100 mEq/L (ref 96–112)
Creatinine, Ser: 0.51 mg/dL (ref 0.50–1.10)
GFR calc Af Amer: >90 mL/min (ref >90)
GFR calc non Af Amer: >90 mL/min (ref >90)
Glucose, Bld: 98 mg/dL (ref 70–99)
Potassium: 4 mEq/L (ref 3.5–5.1)
Sodium: 136 mEq/L (ref 135–145)
Total Bilirubin: 0.3 mg/dL (ref 0.3–1.2)
Total Protein: 6.5 g/dL (ref 6.0–8.3)

## 2012-10-22 LAB — CBC
HCT: 31 % — ABNORMAL LOW (ref 36.0–46.0)
Hemoglobin: 10.6 g/dL — ABNORMAL LOW (ref 12.0–15.0)
MCH: 30.1 pg (ref 26.0–34.0)
MCHC: 34.2 g/dL (ref 30.0–36.0)
MCV: 88.1 fL (ref 78.0–100.0)
Platelets: 257 10*3/uL (ref 150–400)
RBC: 3.52 MIL/uL — ABNORMAL LOW (ref 3.87–5.11)
RDW: 13.3 % (ref 11.5–15.5)
WBC: 11.2 10*3/uL — ABNORMAL HIGH (ref 4.0–10.5)

## 2012-10-22 LAB — TYPE AND SCREEN
ABO/RH(D): O POS
Antibody Screen: NEGATIVE

## 2012-10-22 LAB — URIC ACID: Uric Acid, Serum: 4.6 mg/dL (ref 2.4–7.0)

## 2012-10-22 MED ORDER — METHYLDOPA 500 MG PO TABS
500.0000 mg | ORAL_TABLET | Freq: Two times a day (BID) | ORAL | Status: DC
  Administered 2012-10-22 – 2012-10-24 (×5): 500 mg via ORAL
  Filled 2012-10-22 (×6): qty 1

## 2012-10-22 MED ORDER — BETAMETHASONE SOD PHOS & ACET 6 (3-3) MG/ML IJ SUSP
12.0000 mg | INTRAMUSCULAR | Status: AC
  Administered 2012-10-22 – 2012-10-23 (×2): 12 mg via INTRAMUSCULAR
  Filled 2012-10-22 (×2): qty 2

## 2012-10-22 MED ORDER — OXYCODONE-ACETAMINOPHEN 5-325 MG PO TABS
1.0000 | ORAL_TABLET | ORAL | Status: DC | PRN
  Administered 2012-10-22 – 2012-10-24 (×4): 2 via ORAL
  Filled 2012-10-22 (×5): qty 2

## 2012-10-22 MED ORDER — ACETAMINOPHEN 325 MG PO TABS
650.0000 mg | ORAL_TABLET | ORAL | Status: DC | PRN
  Administered 2012-10-23 (×2): 650 mg via ORAL
  Filled 2012-10-22 (×2): qty 2

## 2012-10-22 MED ORDER — PRENATAL MULTIVITAMIN CH
1.0000 | ORAL_TABLET | Freq: Every day | ORAL | Status: DC
  Administered 2012-10-23: 1 via ORAL
  Filled 2012-10-22 (×2): qty 1

## 2012-10-22 MED ORDER — ZOLPIDEM TARTRATE 5 MG PO TABS
5.0000 mg | ORAL_TABLET | Freq: Every evening | ORAL | Status: DC | PRN
  Administered 2012-10-22 – 2012-10-23 (×2): 5 mg via ORAL
  Filled 2012-10-22 (×2): qty 1

## 2012-10-22 MED ORDER — CALCIUM CARBONATE ANTACID 500 MG PO CHEW: 2.0000 | CHEWABLE_TABLET | ORAL | Status: DC | PRN

## 2012-10-22 MED ORDER — DOCUSATE SODIUM 100 MG PO CAPS
100.0000 mg | ORAL_CAPSULE | Freq: Every day | ORAL | Status: DC
  Administered 2012-10-23 – 2012-10-24 (×2): 100 mg via ORAL
  Filled 2012-10-22 (×2): qty 1

## 2012-10-22 MED ORDER — FAMOTIDINE 20 MG PO TABS
20.0000 mg | ORAL_TABLET | Freq: Two times a day (BID) | ORAL | Status: DC
  Administered 2012-10-22 – 2012-10-24 (×4): 20 mg via ORAL
  Filled 2012-10-22 (×4): qty 1

## 2012-10-22 MED ORDER — LABETALOL HCL 100 MG PO TABS
100.0000 mg | ORAL_TABLET | Freq: Three times a day (TID) | ORAL | Status: DC
  Administered 2012-10-22 – 2012-10-23 (×3): 100 mg via ORAL
  Filled 2012-10-22 (×3): qty 1

## 2012-10-22 NOTE — H&P (Signed)
32 yo G2P1 @ 29+6 wks presents for admission to r/o severe pre-e.  Pt with h/o CHTN managed with aldomet & labetalol this pregnancy.  Presented to office today with c/o HA unresolved with vicodin.  No ctx, vb, or lof.  + FM  PMHx:  CHTN PSHx: laparoscopy, colpo, c-section All:  Azithro, PCN, cephalosporins, bactrim SHX:  No tobacco Meds:  Labetalol tid, aldomet 500mg  bid, PNV FHX:  Hemachromatosis, ovarian ca, lung ca OBHx:  38 wks c-section, pre-e/failed IOL  AF, VSS  + FHT  BP 152/100  Trace proteinuria Gen - NAD CV - RRR Lungs - clear Abd - gravid, NT Ext 1+ edema bilaterally Neuro - 1+ DTR bilaterally.  A/P:  CHTN with probably mild pre-eclampsia  R/o severe/HELLP.  Previous c-section Admit BMZ 24 hr urine Labs Bedrest Percocet prn HA

## 2012-10-23 ENCOUNTER — Encounter (HOSPITAL_COMMUNITY): Payer: Self-pay | Admitting: *Deleted

## 2012-10-23 LAB — PROTEIN, URINE, 24 HOUR
Collection Interval-UPROT: 24 hours
Protein, 24H Urine: 123 mg/d — ABNORMAL HIGH (ref 50–100)
Protein, Urine: 6 mg/dL
Urine Total Volume-UPROT: 2050 mL

## 2012-10-23 LAB — CREATININE CLEARANCE, URINE, 24 HOUR
Collection Interval-CRCL: 24 hours
Creatinine Clearance: 190 mL/min — ABNORMAL HIGH (ref 75–115)
Creatinine, 24H Ur: 1395 mg/d (ref 700–1800)
Creatinine, Urine: 68.05 mg/dL
Creatinine: 0.51 mg/dL (ref 0.50–1.10)
Urine Total Volume-CRCL: 2050 mL

## 2012-10-23 LAB — ABO/RH: ABO/RH(D): O POS

## 2012-10-23 MED ORDER — BUTORPHANOL TARTRATE 1 MG/ML IJ SOLN
1.0000 mg | Freq: Once | INTRAMUSCULAR | Status: DC
  Filled 2012-10-23: qty 1

## 2012-10-23 MED ORDER — BUTALBITAL-APAP-CAFFEINE 50-325-40 MG PO TABS
2.0000 | ORAL_TABLET | ORAL | Status: DC | PRN
  Administered 2012-10-23: 2 via ORAL
  Filled 2012-10-23: qty 2

## 2012-10-23 MED ORDER — BUTORPHANOL TARTRATE 1 MG/ML IJ SOLN
1.0000 mg | Freq: Once | INTRAMUSCULAR | Status: AC
  Administered 2012-10-23: 1 mg via INTRAMUSCULAR

## 2012-10-23 MED ORDER — PROMETHAZINE HCL 25 MG/ML IJ SOLN
12.5000 mg | Freq: Once | INTRAMUSCULAR | Status: AC
  Administered 2012-10-23: 12.5 mg via INTRAMUSCULAR
  Filled 2012-10-23: qty 1

## 2012-10-23 MED ORDER — NIFEDIPINE ER 30 MG PO TB24
30.0000 mg | ORAL_TABLET | Freq: Every day | ORAL | Status: DC
  Administered 2012-10-23 – 2012-10-24 (×2): 30 mg via ORAL
  Filled 2012-10-23 (×3): qty 1

## 2012-10-23 MED ORDER — BUTORPHANOL TARTRATE 1 MG/ML IJ SOLN
1.0000 mg | Freq: Once | INTRAMUSCULAR | Status: AC
  Administered 2012-10-23: 1 mg via INTRAMUSCULAR
  Filled 2012-10-23: qty 1

## 2012-10-23 NOTE — Progress Notes (Signed)
Ur chart review completed.  

## 2012-10-23 NOTE — Progress Notes (Addendum)
Patient ID: [name], female   DOB: [birth year], 32 y.o.   MRN: [id number] 113/66 142/78 Pt still has mild left temporal HA No scotomata or RUQ pain VS BP 113/66 - 142/78 FHR 140s  Abd Gravid nt DTRs 1/4  IUP 30 0/7 A/P: CHTN with probably mild pre-eclampsia R/o severe/HELLP. Previous c-section  No sxs of PIH  BMZ and 24 hr urine complete at 2p, Labs normal Bedrest  Percocet prn HA, will try fioricet Consider outpt management

## 2012-10-24 ENCOUNTER — Encounter (HOSPITAL_COMMUNITY): Payer: Self-pay | Admitting: Radiology

## 2012-10-24 ENCOUNTER — Inpatient Hospital Stay (HOSPITAL_COMMUNITY): Payer: BC Managed Care – PPO

## 2012-10-24 MED ORDER — HYDROMORPHONE HCL 2 MG PO TABS: ORAL_TABLET | ORAL | Status: DC

## 2012-10-24 MED ORDER — BUTORPHANOL TARTRATE 1 MG/ML IJ SOLN
2.0000 mg | Freq: Once | INTRAMUSCULAR | Status: AC
  Administered 2012-10-24: 2 mg via INTRAMUSCULAR
  Filled 2012-10-24: qty 2

## 2012-10-24 MED ORDER — ACETAMINOPHEN 325 MG PO TABS: 650.0000 mg | ORAL_TABLET | Freq: Four times a day (QID) | ORAL | Status: DC | PRN

## 2012-10-24 MED ORDER — NIFEDIPINE ER 30 MG PO TB24: 30.0000 mg | ORAL_TABLET | Freq: Every day | ORAL | Status: DC

## 2012-10-24 MED ORDER — IOHEXOL 300 MG/ML  SOLN
75.0000 mL | Freq: Once | INTRAMUSCULAR | Status: AC | PRN
  Administered 2012-10-24: 75 mL via INTRAVENOUS

## 2012-10-24 MED ORDER — PROMETHAZINE HCL 25 MG/ML IJ SOLN
25.0000 mg | Freq: Once | INTRAMUSCULAR | Status: AC
  Administered 2012-10-24: 25 mg via INTRAMUSCULAR
  Filled 2012-10-24: qty 1

## 2012-10-24 NOTE — Progress Notes (Signed)
D/W Dr Manson Passey - will order CT with and without contrast

## 2012-10-24 NOTE — Progress Notes (Signed)
HA has returned some  Filed Vitals:   10/24/12 1627  BP: 139/72  Pulse: 96  Temp: 97.9 F (36.6 C)  Resp: 18   Head MRI and MRV both normal  FHT reactive  A: CHTN-no current evidence of sever preeclampsia      HA throughout pregnancy, worse for 3 weeks  P: D/W patient options     Will D/C home for MBR      Dilaudid 2 mg, 1-2 q6hrs prn NR      Tylenol prn HA       FU office 4 days       Will refer to neuro for HA evaluation.

## 2012-10-24 NOTE — Progress Notes (Signed)
30 1/7 weeks HA , right temporal, returns after meds wear off. Back again today. Occasional flashes of light recently. Some floaters in vision but no blurry vision.   Filed Vitals:   10/24/12 0823  BP: 152/85  Pulse: 104  Temp: 97.9 F (36.6 C)  Resp: 18   Results for orders placed during the hospital encounter of 10/22/12 (from the past 48 hour(s))  PROTEIN, URINE, 24 HOUR     Status: Abnormal   Collection Time    10/22/12 12:50 PM      Result Value Range   Urine Total Volume-UPROT 2050     Collection Interval-UPROT 24     Protein, Urine 6     Protein, 24H Urine 123 (*) 50 - 100 mg/day  CREATININE CLEARANCE, URINE, 24 HOUR     Status: Abnormal   Collection Time    10/22/12 12:50 PM      Result Value Range   Urine Total Volume-CRCL 2050     Collection Interval-CRCL 24     Creatinine, Urine 68.05     Creatinine 0.51  0.50 - 1.10 mg/dL   Creatinine, 40J Ur 8119  700 - 1800 mg/day   Creatinine Clearance 190 (*) 75 - 115 mL/min  COMPREHENSIVE METABOLIC PANEL     Status: Abnormal   Collection Time    10/22/12  3:35 PM      Result Value Range   Sodium 136  135 - 145 mEq/L   Potassium 4.0  3.5 - 5.1 mEq/L   Chloride 100  96 - 112 mEq/L   CO2 23  19 - 32 mEq/L   Glucose, Bld 98  70 - 99 mg/dL   BUN 5 (*) 6 - 23 mg/dL   Creatinine, Ser 1.47  0.50 - 1.10 mg/dL   Calcium 9.6  8.4 - 82.9 mg/dL   Total Protein 6.5  6.0 - 8.3 g/dL   Albumin 2.9 (*) 3.5 - 5.2 g/dL   AST 10  0 - 37 U/L   ALT 10  0 - 35 U/L   Alkaline Phosphatase 62  39 - 117 U/L   Total Bilirubin 0.3  0.3 - 1.2 mg/dL   GFR calc non Af Amer >90  >90 mL/min   GFR calc Af Amer >90  >90 mL/min   Comment:            The eGFR has been calculated     using the CKD EPI equation.     This calculation has not been     validated in all clinical     situations.     eGFR's persistently     <90 mL/min signify     possible Chronic Kidney Disease.  CBC     Status: Abnormal   Collection Time    10/22/12  3:35 PM   Result Value Range   WBC 11.2 (*) 4.0 - 10.5 K/uL   RBC 3.52 (*) 3.87 - 5.11 MIL/uL   Hemoglobin 10.6 (*) 12.0 - 15.0 g/dL   HCT 56.2 (*) 13.0 - 86.5 %   MCV 88.1  78.0 - 100.0 fL   MCH 30.1  26.0 - 34.0 pg   MCHC 34.2  30.0 - 36.0 g/dL   RDW 78.4  69.6 - 29.5 %   Platelets 257  150 - 400 K/uL  URIC ACID     Status: None   Collection Time    10/22/12  3:35 PM      Result Value Range   Uric  Acid, Serum 4.6  2.4 - 7.0 mg/dL  CULTURE, BETA STREP (GROUP B ONLY)     Status: None   Collection Time    10/22/12  5:00 PM      Result Value Range   Specimen Description VAGINAL/RECTAL     Special Requests NONE     Culture Culture reincubated for better growth     Report Status PENDING    TYPE AND SCREEN     Status: None   Collection Time    10/22/12  8:05 PM      Result Value Range   ABO/RH(D) O POS     Antibody Screen NEG     Sample Expiration 10/25/2012    ABO/RH     Status: None   Collection Time    10/22/12  8:05 PM      Result Value Range   ABO/RH(D) O POS     Lungs CTA Cor RRR Abd no epigastric pain, uterus soft and NT DTR 2+ without clonus  FHT reactive  A: CHTN-BP better today on Aldomet and Procardia     Labs and BP not C/W severe preeclampsia today     HA-no signifigant history of HA prior to pregnancy, HA in first trimester and now progressively worse x 3 weeks with visual changes  P: Continue same BP meds      D/W patient HA-Will order head CT

## 2012-10-24 NOTE — Progress Notes (Signed)
Radiologist called about CT-suggestive of an issue in right transverse sinus He recommends NMR to further evaluate  D/W patient-all questions answered  Patient also C/O HA pain and asks for pain meds Will treat with stadol/phenergan

## 2012-10-24 NOTE — Progress Notes (Signed)
Pt. Is stable and ready to be discharged home. All belongings are with the patient. She is leaving with her mother. All discharge instructions are given along with prescriptions and all questions answered.

## 2012-10-24 NOTE — Progress Notes (Signed)
Sent with [facility] to [facility] for MRI/MRV

## 2012-10-24 NOTE — Progress Notes (Signed)
[name] was tearful during our conversation.  In addition to all that she is coping with medically, she is also grieving for her grandmother, who passed away 3 weeks ago.  Her grandmother was an important support person and she is missing having that support from her while she is going through such a difficult time with her pregnancy.  She has good family support still from her mother and from her husband.  She has also has good friends who are helping to care for her 83 year old while she is here in the hospital.  She does not seem overly concerned about the results of the CT and the upcoming MRI, but she is relieved that they have found some reason for why she is having the headaches.    I will follow up with her tomorrow afternoon to see how she is doing after her MRI.  Please also page as needs arise, [phone].  Chaplain Dyanne Carrel 12:21 PM   10/24/12 1200  Clinical Encounter Type  Visited With Patient  Visit Type Spiritual support  Referral From Nurse  Spiritual Encounters  Spiritual Needs Emotional;Grief support (Pt recently lost her grandmother.)

## 2012-10-24 NOTE — Discharge Summary (Signed)
Physician Discharge Summary  Patient ID: [name] MRN: [id number] DOB/AGE: [birth year] 32 y.o.  Admit date: 10/22/2012 Discharge date: 10/24/2012  Admission Diagnoses:CHTN, Headache  Discharge Diagnoses: CHTN, Headache Active Problems:   * No active hospital problems. *   Discharged Condition: good  Hospital Course: 32 yo G2P1 with CHTN. Had headache first trimester thought to be due to either BP or labetalol. Admitted 2 days ago with worsening left temporal headache. Aldomet continued and labetalol stopped and procardia started yesterday. Labs OK for pregnancy. Headache not relieved with Fioricet and partial relief with Percocet. Relieved better with IM Stadol/Phenergan. Has some flashes of light in right visual field. Head CT today suspicious for temporal sinus thrombosis.  Subsequent MRI and MRV both normal. Headache continues , moderate in severity. D/W patient options, she would like to go home.  Consults: None  Significant Diagnostic Studies: labs:  Results for orders placed during the hospital encounter of 10/22/12 (from the past 72 hour(s))  PROTEIN, URINE, 24 HOUR     Status: Abnormal   Collection Time    10/22/12 12:50 PM      Result Value Range   Urine Total Volume-UPROT 2050     Collection Interval-UPROT 24     Protein, Urine 6     Protein, 24H Urine 123 (*) 50 - 100 mg/day  CREATININE CLEARANCE, URINE, 24 HOUR     Status: Abnormal   Collection Time    10/22/12 12:50 PM      Result Value Range   Urine Total Volume-CRCL 2050     Collection Interval-CRCL 24     Creatinine, Urine 68.05     Creatinine 0.51  0.50 - 1.10 mg/dL   Creatinine, 40J Ur 8119  700 - 1800 mg/day   Creatinine Clearance 190 (*) 75 - 115 mL/min  COMPREHENSIVE METABOLIC PANEL     Status: Abnormal   Collection Time    10/22/12  3:35 PM      Result Value Range   Sodium 136  135 - 145 mEq/L   Potassium 4.0  3.5 - 5.1 mEq/L   Chloride 100  96 - 112 mEq/L   CO2 23  19 - 32 mEq/L   Glucose,  Bld 98  70 - 99 mg/dL   BUN 5 (*) 6 - 23 mg/dL   Creatinine, Ser 1.47  0.50 - 1.10 mg/dL   Calcium 9.6  8.4 - 82.9 mg/dL   Total Protein 6.5  6.0 - 8.3 g/dL   Albumin 2.9 (*) 3.5 - 5.2 g/dL   AST 10  0 - 37 U/L   ALT 10  0 - 35 U/L   Alkaline Phosphatase 62  39 - 117 U/L   Total Bilirubin 0.3  0.3 - 1.2 mg/dL   GFR calc non Af Amer >90  >90 mL/min   GFR calc Af Amer >90  >90 mL/min   Comment:            The eGFR has been calculated     using the CKD EPI equation.     This calculation has not been     validated in all clinical     situations.     eGFR's persistently     <90 mL/min signify     possible Chronic Kidney Disease.  CBC     Status: Abnormal   Collection Time    10/22/12  3:35 PM      Result Value Range   WBC 11.2 (*) 4.0 - 10.5 K/uL  RBC 3.52 (*) 3.87 - 5.11 MIL/uL   Hemoglobin 10.6 (*) 12.0 - 15.0 g/dL   HCT 95.2 (*) 84.1 - 32.4 %   MCV 88.1  78.0 - 100.0 fL   MCH 30.1  26.0 - 34.0 pg   MCHC 34.2  30.0 - 36.0 g/dL   RDW 40.1  02.7 - 25.3 %   Platelets 257  150 - 400 K/uL  URIC ACID     Status: None   Collection Time    10/22/12  3:35 PM      Result Value Range   Uric Acid, Serum 4.6  2.4 - 7.0 mg/dL  CULTURE, BETA STREP (GROUP B ONLY)     Status: None   Collection Time    10/22/12  5:00 PM      Result Value Range   Specimen Description VAGINAL/RECTAL     Special Requests NONE     Culture Culture reincubated for better growth     Report Status PENDING    TYPE AND SCREEN     Status: None   Collection Time    10/22/12  8:05 PM      Result Value Range   ABO/RH(D) O POS     Antibody Screen NEG     Sample Expiration 10/25/2012    ABO/RH     Status: None   Collection Time    10/22/12  8:05 PM      Result Value Range   ABO/RH(D) O POS     and radiology:  *RADIOLOGY REPORT*  Clinical Data: Right temporal headache for 1 week with visual  symptoms.  CT HEAD WITHOUT AND WITH CONTRAST  Technique: Contiguous axial images were obtained from the base of   the skull through the vertex without and with intravenous contrast.  Contrast: 75mL OMNIPAQUE IOHEXOL 300 MG/ML SOLN  Comparison: None.  Findings:  Calvarium: No acute osseous abnormality. No lytic or blastic  lesion.  Orbits: No acute abnormality.  Brain: The dominant right transverse sinus and proximal sigmoid  sinus shows incomplete filling on at least two slices.  Unfortunately, thin section imaging not available. No parenchymal  changes such as hemorrhage or edema. No evidence of hydrocephalus,  mass lesion, or shift.  Critical Value/emergent results were called by telephone at the  time of interpretation on date of dictation at 11 :00 a.m. to Dr  Henderson Cloud, who verbally acknowledged these results.  IMPRESSION:  1. Questionable patency of the right transverse venous sinus.  If dural venous sinus thrombosis is a clinical possibility,  noncontrast (due to pregnancy) brain MRI/MRV or CT venogram is  recommended.  2. No parenchymal changes of edema or hemorrhage.  Original Report Authenticated By: Tiburcio Pea       Clinical Data: New onset left temporal headaches. [redacted] weeks  pregnant. Abnormal CT scan.  MRI HEAD WITHOUT CONTRAST  Technique: Multiplanar, multiecho pulse sequences of the brain and  surrounding structures were obtained without intravenous contrast.  Comparison: CT head without contrast 10/24/2012.  Findings: No acute intracranial abnormalities are present.  Specifically, there is no evidence for acute infarct, hemorrhage,  mass, hydrocephalus, or significant extra-axial fluid collection.  Flow is present in the major intracranial arteries. The globes and  orbits are intact. The paranasal sinuses and mastoid air cells are  clear.  The right transverse sigmoid sinus appear be within normal limits.  No significant susceptibility artifacts are present.  IMPRESSION:  Normal MRI of the brain.  *RADIOLOGY REPORT*  MRV HEAD WITHOUT CONTRAST  Technique: Angiographic  images of the intracranial venous  structures were obtained using MRV technique without intravenous  contrast.  Findings: Noncontrast MR venogram images demonstrate a dominant  right transverse and sigmoid sinus. The sinus is patent. The  nondominant left transverse and sphenoid sinuses are patent as  well. The right internal jugular vein is larger than on the left.  The cortical veins are within normal limits. The straight sinus  and deep cerebral veins are patent.  IMPRESSION:  Normal MR venogram of the head.  Original Report Authenticated By: Marin Roberts, M.D.   Treatments: IV hydration, analgesia: acetaminophen, acetaminophen w/ codeine and Fiorcet and cardiac meds: labetolol and aldomet, procardia  Discharge Exam: Blood pressure 139/72, pulse 96, temperature 97.9 F (36.6 C), temperature source Oral, resp. rate 18, height 5\' 5"  (1.651 m), weight 221 lb 14.4 oz (100.653 kg), last menstrual period 11/16/2011. General appearance: alert, cooperative and no distress Head: Normocephalic, without obvious abnormality, atraumatic Eyes: conjunctivae/corneas clear. PERRL, EOM's intact. Fundi benign., Pupils equal, smile symmetric Cardio: regular rate and rhythm  Disposition: 01-Home or Self Care     Medication List    STOP taking these medications       calcium carbonate 500 MG chewable tablet  Commonly known as:  TUMS - dosed in mg elemental calcium     IRON PO     labetalol 100 MG tablet  Commonly known as:  NORMODYNE     oxyCODONE-acetaminophen 5-325 MG per tablet  Commonly known as:  PERCOCET     STOOL SOFTENER PO      TAKE these medications       acetaminophen 325 MG tablet  Commonly known as:  TYLENOL  Take 2 tablets (650 mg total) by mouth every 6 (six) hours as needed (discomfortORtemperature> 100.5 F).     HYDROmorphone 2 MG tablet  Commonly known as:  DILAUDID  Take 1 or 2 tablets every 6 hours as needed for head ache     methyldopa 250 MG  tablet  Commonly known as:  ALDOMET  Take 500 mg by mouth 2 (two) times daily.     multivitamin-prenatal 27-0.8 MG Tabs  Take 1 tablet by mouth daily at 12 noon.     NIFEdipine 30 MG 24 hr tablet  Commonly known as:  PROCARDIA-XL/ADALAT CC  Take 1 tablet (30 mg total) by mouth daily.     ranitidine 150 MG tablet  Commonly known as:  ZANTAC  Take 150 mg by mouth daily as needed for heartburn.           Follow-up Information   Call in 4 days to follow up.      Signed: Retta Mac E 10/24/2012, 5:49 PM

## 2012-10-25 LAB — CULTURE, BETA STREP (GROUP B ONLY)

## 2012-11-13 ENCOUNTER — Encounter: Payer: Self-pay | Admitting: Neurology

## 2012-11-13 DIAGNOSIS — R519 Headache, unspecified: Secondary | ICD-10-CM

## 2012-11-14 ENCOUNTER — Ambulatory Visit: Payer: BC Managed Care – PPO | Admitting: Neurology

## 2012-11-16 ENCOUNTER — Inpatient Hospital Stay (HOSPITAL_COMMUNITY)
Admission: AD | Admit: 2012-11-16 | Discharge: 2012-11-17 | Disposition: A | Discharge status: Home / Self Care | Payer: BC Managed Care – PPO | Source: Ambulatory Visit | Attending: Obstetrics & Gynecology | Admitting: Obstetrics & Gynecology

## 2012-11-16 ENCOUNTER — Encounter (HOSPITAL_COMMUNITY): Payer: Self-pay

## 2012-11-16 DIAGNOSIS — O2390 Unspecified genitourinary tract infection in pregnancy, unspecified trimester: Secondary | ICD-10-CM | POA: Insufficient documentation

## 2012-11-16 DIAGNOSIS — O10913 Unspecified pre-existing hypertension complicating pregnancy, third trimester: Secondary | ICD-10-CM

## 2012-11-16 DIAGNOSIS — O10019 Pre-existing essential hypertension complicating pregnancy, unspecified trimester: Secondary | ICD-10-CM | POA: Insufficient documentation

## 2012-11-16 DIAGNOSIS — E876 Hypokalemia: Secondary | ICD-10-CM | POA: Insufficient documentation

## 2012-11-16 DIAGNOSIS — N390 Urinary tract infection, site not specified: Secondary | ICD-10-CM | POA: Insufficient documentation

## 2012-11-16 LAB — COMPREHENSIVE METABOLIC PANEL
ALT: 7 U/L (ref 0–35)
AST: 18 U/L (ref 0–37)
Albumin: 2.7 g/dL — ABNORMAL LOW (ref 3.5–5.2)
Alkaline Phosphatase: 69 U/L (ref 39–117)
BUN: 6 mg/dL (ref 6–23)
CO2: 20 mEq/L (ref 19–32)
Calcium: 9.6 mg/dL (ref 8.4–10.5)
Chloride: 101 mEq/L (ref 96–112)
Creatinine, Ser: 0.48 mg/dL — ABNORMAL LOW (ref 0.50–1.10)
GFR calc Af Amer: >90 mL/min (ref >90)
GFR calc non Af Amer: >90 mL/min (ref >90)
Glucose, Bld: 128 mg/dL — ABNORMAL HIGH (ref 70–99)
Potassium: 3.2 mEq/L — ABNORMAL LOW (ref 3.5–5.1)
Sodium: 133 mEq/L — ABNORMAL LOW (ref 135–145)
Total Bilirubin: 0.2 mg/dL — ABNORMAL LOW (ref 0.3–1.2)
Total Protein: 6.9 g/dL (ref 6.0–8.3)

## 2012-11-16 LAB — CBC
HCT: 31.1 % — ABNORMAL LOW (ref 36.0–46.0)
Hemoglobin: 10.6 g/dL — ABNORMAL LOW (ref 12.0–15.0)
MCH: 29.9 pg (ref 26.0–34.0)
MCHC: 34.1 g/dL (ref 30.0–36.0)
MCV: 87.6 fL (ref 78.0–100.0)
Platelets: 217 10*3/uL (ref 150–400)
RBC: 3.55 MIL/uL — ABNORMAL LOW (ref 3.87–5.11)
RDW: 13.5 % (ref 11.5–15.5)
WBC: 8.8 10*3/uL (ref 4.0–10.5)

## 2012-11-16 LAB — URINALYSIS, ROUTINE W REFLEX MICROSCOPIC
Bilirubin Urine: NEGATIVE
Glucose, UA: NEGATIVE mg/dL
Hgb urine dipstick: NEGATIVE
Ketones, ur: NEGATIVE mg/dL
Nitrite: NEGATIVE
Protein, ur: NEGATIVE mg/dL
Specific Gravity, Urine: 1.025 (ref 1.005–1.030)
Urobilinogen, UA: 0.2 mg/dL (ref 0.0–1.0)
pH: 6.5 (ref 5.0–8.0)

## 2012-11-16 LAB — URINE MICROSCOPIC-ADD ON

## 2012-11-16 LAB — URIC ACID: Uric Acid, Serum: 4.3 mg/dL (ref 2.4–7.0)

## 2012-11-16 MED ORDER — NIFEDIPINE ER OSMOTIC RELEASE 30 MG PO TB24: 30.0000 mg | ORAL_TABLET | Freq: Once | ORAL | Status: DC

## 2012-11-16 NOTE — MAU Provider Note (Signed)
History     CSN: [id number]  Arrival date and time: 11/16/12 2212   First Provider Initiated Contact with Patient 11/16/12 2244      Chief Complaint  Patient presents with  . Hypertension   HPI  Pt is a G2P1001 at 33.3 wks IUP here with report of elevated blood pressure at home and increasing problems with blurry vision.  No report of headache or epigastric pain.  Discomfort in upper abdomen, but uncertain if it's baby's position.  +intermittent cramping, none at this time.    No leaking of fluid or vaginal bleeding.    Past Medical History  Diagnosis Date  . Hyperlipidemia   . Hypertension   . Pregnancy induced hypertension 2008  . HA (headache)     Past Surgical History  Procedure Laterality Date  . Tonsillectomy    . Tympanostomy tube placement    . Cesarean section  2008  . Laparoscopy  12/01/2011    Procedure: LAPAROSCOPY OPERATIVE;  Surgeon: Leslie Andrea, MD;  Location: [facility];  Service: Gynecology;  Laterality: N/A;  . Esophageal biopsy  12/01/2011    Procedure: BIOPSY;  Surgeon: Leslie Andrea, MD;  Location: [facility];  Service: Gynecology;  Laterality: N/A;  . Endometrial biopsy      Family History  Problem Relation Age of Onset  . Hypertension Father   . Coronary artery disease Paternal Uncle   . Cancer Paternal Grandfather   . Diabetes Neg Hx     History  Substance Use Topics  . Smoking status: Never Smoker   . Smokeless tobacco: Never Used  . Alcohol Use: No    Allergies:  Allergies  Allergen Reactions  . Azithromycin Rash  . Cephalosporins Rash    Allergy noted with cefprozil (Cefzil) and cefuroxime (Ceftin)  . Penicillins Rash    Allergy noted with Amoxicillin  . Sulfamethoxazole W-Trimethoprim Swelling and Rash    Prescriptions prior to admission  Medication Sig Dispense Refill  . acetaminophen (TYLENOL) 325 MG tablet Take 2 tablets (650 mg total) by mouth every 6 (six) hours as needed (discomfortORtemperature> 100.5 F).  40  tablet  2  . calcium carbonate (TUMS - DOSED IN MG ELEMENTAL CALCIUM) 500 MG chewable tablet Chew 1 tablet by mouth daily.      Marland Kitchen HYDROmorphone (DILAUDID) 2 MG tablet Take 1 or 2 tablets every 6 hours as needed for head ache  40 tablet  0  . methyldopa (ALDOMET) 250 MG tablet Take 500 mg by mouth 2 (two) times daily.      Marland Kitchen NIFEdipine (PROCARDIA-XL/ADALAT CC) 30 MG 24 hr tablet Take 1 tablet (30 mg total) by mouth daily.  30 tablet  2  . Prenatal Vit-Fe Fumarate-FA (MULTIVITAMIN-PRENATAL) 27-0.8 MG TABS Take 1 tablet by mouth daily at 12 noon.      . ranitidine (ZANTAC) 150 MG tablet Take 150 mg by mouth daily as needed for heartburn.        Review of Systems  Eyes: Positive for blurred vision.  Cardiovascular: Negative for chest pain and palpitations.  Gastrointestinal: Positive for abdominal pain (mild cramping).  Musculoskeletal:       Hand swelling that started last week.  Neurological: Negative for headaches.  All other systems reviewed and are negative.   Physical Exam   Blood pressure 166/95, pulse 103, resp. rate 20, height 5\' 5"  (1.651 m), weight 101.515 kg (223 lb 12.8 oz), last menstrual period 11/16/2011, SpO2 100.00%. Filed Vitals:   11/16/12 2300 11/16/12  2316 11/16/12 2330 11/16/12 2344  BP: 153/95 147/92 135/94 136/78  Pulse: 104 101 105 96  Resp:      Height:      Weight:      SpO2:        Physical Exam  Constitutional: She is oriented to person, place, and time. She appears well-developed and well-nourished. No distress.  HENT:  Head: Normocephalic.  Eyes: Pupils are equal, round, and reactive to light.  Neck: Normal range of motion. Neck supple.  Cardiovascular: Normal rate and regular rhythm.   Murmur (Grade I SEM) heard. Respiratory: Effort normal and breath sounds normal. No respiratory distress.  GI: Soft. There is no tenderness.  Genitourinary: No bleeding around the vagina. Vaginal discharge (mucusy) found.  Cervix closed  Musculoskeletal: Normal  range of motion. She exhibits edema (bilat 1+ pedal edema; swelling noted in hands).  Trace pedal edema  Neurological: She is alert and oriented to person, place, and time. She has normal reflexes. She displays normal reflexes.  Skin: Skin is warm and dry.    MAU Course  Procedures 2300 Dr. Langston Masker called > reviewed HPI/vitals/fetal tracing > obtain labs and call with results 2310 Dr. Langston Masker calls and request for procardia 30 mg to be given in MAU > if blood pressures and labs normal > DC home, increase procardia to BID and follow-up in office on Monday.  Results for orders placed during the hospital encounter of 11/16/12 (from the past 24 hour(s))  URINALYSIS, ROUTINE W REFLEX MICROSCOPIC     Status: Abnormal   Collection Time    11/16/12 10:20 PM      Result Value Range   Color, Urine YELLOW  YELLOW   APPearance HAZY (*) CLEAR   Specific Gravity, Urine 1.025  1.005 - 1.030   pH 6.5  5.0 - 8.0   Glucose, UA NEGATIVE  NEGATIVE mg/dL   Hgb urine dipstick NEGATIVE  NEGATIVE   Bilirubin Urine NEGATIVE  NEGATIVE   Ketones, ur NEGATIVE  NEGATIVE mg/dL   Protein, ur NEGATIVE  NEGATIVE mg/dL   Urobilinogen, UA 0.2  0.0 - 1.0 mg/dL   Nitrite NEGATIVE  NEGATIVE   Leukocytes, UA MODERATE (*) NEGATIVE  URINE MICROSCOPIC-ADD ON     Status: Abnormal   Collection Time    11/16/12 10:20 PM      Result Value Range   Squamous Epithelial / LPF MANY (*) RARE   WBC, UA 0-2  <3 WBC/hpf   RBC / HPF 0-2  <3 RBC/hpf   Bacteria, UA MANY (*) RARE   Urine-Other MUCOUS PRESENT    CBC     Status: Abnormal   Collection Time    11/16/12 11:22 PM      Result Value Range   WBC 8.8  4.0 - 10.5 K/uL   RBC 3.55 (*) 3.87 - 5.11 MIL/uL   Hemoglobin 10.6 (*) 12.0 - 15.0 g/dL   HCT 16.1 (*) 09.6 - 04.5 %   MCV 87.6  78.0 - 100.0 fL   MCH 29.9  26.0 - 34.0 pg   MCHC 34.1  30.0 - 36.0 g/dL   RDW 40.9  81.1 - 91.4 %   Platelets 217  150 - 400 K/uL  COMPREHENSIVE METABOLIC PANEL     Status: Abnormal    Collection Time    11/16/12 11:22 PM      Result Value Range   Sodium 133 (*) 135 - 145 mEq/L   Potassium 3.2 (*) 3.5 - 5.1 mEq/L  Chloride 101  96 - 112 mEq/L   CO2 20  19 - 32 mEq/L   Glucose, Bld 128 (*) 70 - 99 mg/dL   BUN 6  6 - 23 mg/dL   Creatinine, Ser 8.29 (*) 0.50 - 1.10 mg/dL   Calcium 9.6  8.4 - 56.2 mg/dL   Total Protein 6.9  6.0 - 8.3 g/dL   Albumin 2.7 (*) 3.5 - 5.2 g/dL   AST 18  0 - 37 U/L   ALT 7  0 - 35 U/L   Alkaline Phosphatase 69  39 - 117 U/L   Total Bilirubin 0.2 (*) 0.3 - 1.2 mg/dL   GFR calc non Af Amer >90  >90 mL/min   GFR calc Af Amer >90  >90 mL/min  URIC ACID     Status: None   Collection Time    11/16/12 11:22 PM      Result Value Range   Uric Acid, Serum 4.3  2.4 - 7.0 mg/dL    Assessment and Plan  Chronic Hypertension Category I FHR Tracing UTI Hypokalemia  Plan: DC to home Increase Procardia to 30 mg XL BID Macrobid 100 BID x 7 days; urine culture Potassium Chloride 40 meq QD x 2 Follow-up in office on Monday.    [facility] 11/16/2012, 10:46 PM

## 2012-11-16 NOTE — MAU Note (Signed)
Pt states bp at home was 157/102. States seeing floaters and having burry vision. She states she just "doesn't feel right"

## 2012-11-17 DIAGNOSIS — O10019 Pre-existing essential hypertension complicating pregnancy, unspecified trimester: Secondary | ICD-10-CM

## 2012-11-17 MED ORDER — POTASSIUM CHLORIDE ER 10 MEQ PO TBCR: 10.0000 meq | EXTENDED_RELEASE_TABLET | Freq: Two times a day (BID) | ORAL | Status: DC

## 2012-11-17 MED ORDER — NIFEDIPINE ER 30 MG PO TB24: 30.0000 mg | ORAL_TABLET | Freq: Two times a day (BID) | ORAL | Status: DC

## 2012-11-17 MED ORDER — NITROFURANTOIN MONOHYD MACRO 100 MG PO CAPS: 100.0000 mg | ORAL_CAPSULE | Freq: Two times a day (BID) | ORAL | Status: DC

## 2012-11-19 ENCOUNTER — Encounter (HOSPITAL_COMMUNITY): Payer: Self-pay | Admitting: *Deleted

## 2012-11-19 ENCOUNTER — Inpatient Hospital Stay (HOSPITAL_COMMUNITY)
Admission: AD | Admit: 2012-11-19 | Discharge: 2012-11-19 | Disposition: A | Discharge status: Home / Self Care | Payer: BC Managed Care – PPO | Source: Ambulatory Visit | Attending: Obstetrics and Gynecology | Admitting: Obstetrics and Gynecology

## 2012-11-19 DIAGNOSIS — R51 Headache: Secondary | ICD-10-CM

## 2012-11-19 DIAGNOSIS — O10019 Pre-existing essential hypertension complicating pregnancy, unspecified trimester: Secondary | ICD-10-CM

## 2012-11-19 DIAGNOSIS — O4703 False labor before 37 completed weeks of gestation, third trimester: Secondary | ICD-10-CM

## 2012-11-19 DIAGNOSIS — R109 Unspecified abdominal pain: Secondary | ICD-10-CM | POA: Insufficient documentation

## 2012-11-19 DIAGNOSIS — O4700 False labor before 37 completed weeks of gestation, unspecified trimester: Secondary | ICD-10-CM | POA: Insufficient documentation

## 2012-11-19 DIAGNOSIS — O10913 Unspecified pre-existing hypertension complicating pregnancy, third trimester: Secondary | ICD-10-CM

## 2012-11-19 LAB — URINALYSIS, ROUTINE W REFLEX MICROSCOPIC
Bilirubin Urine: NEGATIVE
Glucose, UA: NEGATIVE mg/dL
Hgb urine dipstick: NEGATIVE
Ketones, ur: NEGATIVE mg/dL
Leukocytes, UA: NEGATIVE
Nitrite: NEGATIVE
Protein, ur: NEGATIVE mg/dL
Specific Gravity, Urine: 1.015 (ref 1.005–1.030)
Urobilinogen, UA: 0.2 mg/dL (ref 0.0–1.0)
pH: 6.5 (ref 5.0–8.0)

## 2012-11-19 LAB — URINE CULTURE: Colony Count: 65000

## 2012-11-19 MED ORDER — METHYLDOPA 500 MG PO TABS
500.0000 mg | ORAL_TABLET | Freq: Once | ORAL | Status: AC
  Administered 2012-11-19: 500 mg via ORAL
  Filled 2012-11-19: qty 1

## 2012-11-19 MED ORDER — LABETALOL HCL 100 MG PO TABS: 200.0000 mg | ORAL_TABLET | Freq: Once | ORAL | Status: DC

## 2012-11-19 NOTE — MAU Note (Addendum)
PT  SAYS SHE WAS IN OFFICE TODAY   SAW DR Vincente Poli   FROM  FOLLOW-UP FROM Friday.  VE- FINE.  HOME, LAYED DOWN STARTED  FEELING CRAMPING- THEN TO HER BACK.  SHE CALLED OFFICE - - WAITED AN 1 HR.  C/S  [facility] FOR 39 WEEKS 9-10.     NOW FEELS SOME    TIGHTENING.      FEELS SLIGHT NAUSEA,   HAS H/A

## 2012-11-19 NOTE — MAU Provider Note (Signed)
History     CSN: [id number]  Arrival date and time: 11/19/12 0102   First Provider Initiated Contact with Patient 11/19/12 0154      No chief complaint on file.  HPI [name] is a 32 y.o. G2P1001 at [redacted]w[redacted]d with chronic HTN who presents to MAU today with complaint of abdominal cramping. The patient was seen here on Saturday for increased BP and her dose of procardia was increased to BID at that time. She was also diagnosed with a UTI and started on Macrobid. The patient was told to follow-up in the office today. She saw Dr. Vincente Poli around 5 pm today and had her cervix checked. The patient states that she went home and laid down and started having cramping that became progressively more intense. She also started to have headache that she rates at 7/10 now. She took her BP med and PO dilaudid at that time without relief. She is also having lower back pressure and constant cramping now. She denies contractions, vaginal bleeding, LOF, RUQ pain. She is having peripheral edema and occasional blurred vision and floaters. She reports good fetal movement.   OB History   Grav Para Term Preterm Abortions TAB SAB Ect Mult Living   2 1 1       1       Past Medical History  Diagnosis Date  . Hyperlipidemia   . Hypertension   . Pregnancy induced hypertension 2008  . HA (headache)     Past Surgical History  Procedure Laterality Date  . Tonsillectomy    . Tympanostomy tube placement    . Cesarean section  2008  . Laparoscopy  12/01/2011    Procedure: LAPAROSCOPY OPERATIVE;  Surgeon: Leslie Andrea, MD;  Location: [facility];  Service: Gynecology;  Laterality: N/A;  . Esophageal biopsy  12/01/2011    Procedure: BIOPSY;  Surgeon: Leslie Andrea, MD;  Location: [facility];  Service: Gynecology;  Laterality: N/A;  . Endometrial biopsy      Family History  Problem Relation Age of Onset  . Hypertension Father   . Coronary artery disease Paternal Uncle   . Cancer Paternal Grandfather   .  Diabetes Neg Hx     History  Substance Use Topics  . Smoking status: Never Smoker   . Smokeless tobacco: Never Used  . Alcohol Use: No    Allergies:  Allergies  Allergen Reactions  . Azithromycin Rash  . Cephalosporins Rash    Allergy noted with cefprozil (Cefzil) and cefuroxime (Ceftin)  . Penicillins Rash    Allergy noted with Amoxicillin  . Sulfamethoxazole W-Trimethoprim Swelling and Rash    Prescriptions prior to admission  Medication Sig Dispense Refill  . acetaminophen (TYLENOL) 325 MG tablet Take 2 tablets (650 mg total) by mouth every 6 (six) hours as needed (discomfortORtemperature> 100.5 F).  40 tablet  2  . calcium carbonate (TUMS - DOSED IN MG ELEMENTAL CALCIUM) 500 MG chewable tablet Chew 1 tablet by mouth daily.      Marland Kitchen HYDROmorphone (DILAUDID) 2 MG tablet Take 1 or 2 tablets every 6 hours as needed for head ache  40 tablet  0  . methyldopa (ALDOMET) 250 MG tablet Take 500 mg by mouth 2 (two) times daily.      Marland Kitchen NIFEdipine (PROCARDIA-XL/ADALAT CC) 30 MG 24 hr tablet Take 1 tablet (30 mg total) by mouth 2 (two) times daily.  60 tablet  2  . nitrofurantoin, macrocrystal-monohydrate, (MACROBID) 100 MG capsule Take 1 capsule (  100 mg total) by mouth 2 (two) times daily.  14 capsule  0  . potassium chloride (K-DUR) 10 MEQ tablet Take 1 tablet (10 mEq total) by mouth 2 (two) times daily.  6 tablet  0  . Prenatal Vit-Fe Fumarate-FA (MULTIVITAMIN-PRENATAL) 27-0.8 MG TABS Take 1 tablet by mouth daily at 12 noon.      . ranitidine (ZANTAC) 150 MG tablet Take 150 mg by mouth daily as needed for heartburn.        Review of Systems  Constitutional: Negative for fever and malaise/fatigue.  Eyes: Negative for blurred vision.  Gastrointestinal: Positive for nausea and abdominal pain. Negative for vomiting, diarrhea and constipation.  Genitourinary: Negative for dysuria, urgency and frequency.       Neg -vaginal bleeding, discharge, LOF  Neurological: Positive for headaches.  Negative for dizziness and loss of consciousness.   Physical Exam   Blood pressure 128/81, pulse 95, temperature 98 F (36.7 C), temperature source Oral, resp. rate 20, height 5\' 4"  (1.626 m), weight 227 lb (102.967 kg), last menstrual period 11/16/2011, SpO2 99.00%.  Physical Exam  Constitutional: She is oriented to person, place, and time. She appears well-developed and well-nourished. No distress.  HENT:  Head: Normocephalic and atraumatic.  Cardiovascular: Normal rate, regular rhythm and normal heart sounds.   Respiratory: Breath sounds normal. No respiratory distress.  GI: Soft. Bowel sounds are normal. She exhibits no distension and no mass. There is no tenderness. There is no rebound and no guarding.  Musculoskeletal: She exhibits edema (minimal non-pitting edema noted in the extremities). She exhibits no tenderness.  No clonus  Neurological: She is alert and oriented to person, place, and time. She has normal reflexes.  Skin: Skin is warm and dry.  Psychiatric: She has a normal mood and affect.  Dilation: Closed Effacement (%): 60 Cervical Position: Middle Station: Ballotable Presentation: Undeterminable Exam by:: M.Topp,RN  Patient Vitals for the past 24 hrs:  BP Temp Temp src Pulse Resp SpO2 Height Weight  11/19/12 0303 128/81 mmHg - - 95 - - - -  11/19/12 0248 141/97 mmHg - - 98 - - - -  11/19/12 0233 143/90 mmHg - - 97 - - - -  11/19/12 0221 155/90 mmHg - - 99 - - - -  11/19/12 0208 162/102 mmHg - - 100 - - - -  11/19/12 0200 171/110 mmHg - - 108 - - - -  11/19/12 0140 156/101 mmHg - - 102 - 99 % - -  11/19/12 0119 159/98 mmHg 98 F (36.7 C) Oral - 20 - 5\' 4"  (1.626 m) 227 lb (102.967 kg)    Results for orders placed during the hospital encounter of 11/19/12 (from the past 24 hour(s))  URINALYSIS, ROUTINE W REFLEX MICROSCOPIC     Status: None   Collection Time    11/19/12  1:23 AM      Result Value Range   Color, Urine YELLOW  YELLOW   APPearance CLEAR  CLEAR    Specific Gravity, Urine 1.015  1.005 - 1.030   pH 6.5  5.0 - 8.0   Glucose, UA NEGATIVE  NEGATIVE mg/dL   Hgb urine dipstick NEGATIVE  NEGATIVE   Bilirubin Urine NEGATIVE  NEGATIVE   Ketones, ur NEGATIVE  NEGATIVE mg/dL   Protein, ur NEGATIVE  NEGATIVE mg/dL   Urobilinogen, UA 0.2  0.0 - 1.0 mg/dL   Nitrite NEGATIVE  NEGATIVE   Leukocytes, UA NEGATIVE  NEGATIVE   Fetal Monitoring: Baseline: 125 bpm, moderate variability, +  accelerations, no decelerations Contractions: q 3-7 minutes, mild to palpation  MAU Course  Procedures None  MDM Discussed with Dr. Vincente Poli. Give 500 mg of Aldomet and continue to monitor BP Patient shows improvement in BPs after medication given. Patient states improvement in headache, but increased contractions Discussed patient's condition with Dr. Vincente Poli. Rip [place] for discharge at this time. Advise patient to take dose of Procardia at home tonight. Monitor contractions and call office for follow-up instructions in the morning Assessment and Plan  A: Chronic HTN in pregnancy Preterm contractions Headache  P: Discharge home Patient advised to take dose of Procardia at home tonight Patient advised to monitor contractions overnight and call the office in the morning for follow-up instructions Patient may return to MAU as needed or if her condition were to change or worsen   Freddi Starr, PA-C  11/19/2012, 3:32 AM

## 2012-11-20 ENCOUNTER — Encounter (HOSPITAL_COMMUNITY): Payer: Self-pay

## 2012-11-20 ENCOUNTER — Inpatient Hospital Stay (HOSPITAL_COMMUNITY)
Admission: AD | Admit: 2012-11-20 | Discharge: 2012-11-21 | Disposition: A | Discharge status: Home / Self Care | Payer: BC Managed Care – PPO | Source: Ambulatory Visit | Attending: Obstetrics and Gynecology | Admitting: Obstetrics and Gynecology

## 2012-11-20 DIAGNOSIS — O4700 False labor before 37 completed weeks of gestation, unspecified trimester: Secondary | ICD-10-CM | POA: Insufficient documentation

## 2012-11-20 DIAGNOSIS — O4703 False labor before 37 completed weeks of gestation, third trimester: Secondary | ICD-10-CM

## 2012-11-20 DIAGNOSIS — O10913 Unspecified pre-existing hypertension complicating pregnancy, third trimester: Secondary | ICD-10-CM

## 2012-11-20 DIAGNOSIS — O10019 Pre-existing essential hypertension complicating pregnancy, unspecified trimester: Secondary | ICD-10-CM | POA: Insufficient documentation

## 2012-11-20 HISTORY — DX: Reserved for concepts with insufficient information to code with codable children: IMO0002

## 2012-11-20 HISTORY — DX: Unspecified abnormal cytological findings in specimens from cervix uteri: R87.619

## 2012-11-20 HISTORY — DX: Endometriosis, unspecified: N80.9

## 2012-11-20 LAB — URINALYSIS, ROUTINE W REFLEX MICROSCOPIC
Bilirubin Urine: NEGATIVE
Glucose, UA: NEGATIVE mg/dL
Hgb urine dipstick: NEGATIVE
Ketones, ur: NEGATIVE mg/dL
Leukocytes, UA: NEGATIVE
Nitrite: NEGATIVE
Protein, ur: NEGATIVE mg/dL
Specific Gravity, Urine: 1.01 (ref 1.005–1.030)
Urobilinogen, UA: 0.2 mg/dL (ref 0.0–1.0)
pH: 6 (ref 5.0–8.0)

## 2012-11-20 NOTE — MAU Note (Signed)
Contracting every 5-6 minutes since 7pm tonight. Lower abdominal pressure. Denies vaginal bleeding. Positive fetal movement. Denies LOF. Was seen in office today, no cervical exam.

## 2012-11-21 DIAGNOSIS — O479 False labor, unspecified: Secondary | ICD-10-CM

## 2012-11-21 LAB — CBC
HCT: 30.9 % — ABNORMAL LOW (ref 36.0–46.0)
Hemoglobin: 10.6 g/dL — ABNORMAL LOW (ref 12.0–15.0)
MCH: 30 pg (ref 26.0–34.0)
MCHC: 34.3 g/dL (ref 30.0–36.0)
MCV: 87.5 fL (ref 78.0–100.0)
Platelets: 241 10*3/uL (ref 150–400)
RBC: 3.53 MIL/uL — ABNORMAL LOW (ref 3.87–5.11)
RDW: 13.8 % (ref 11.5–15.5)
WBC: 9.6 10*3/uL (ref 4.0–10.5)

## 2012-11-21 LAB — COMPREHENSIVE METABOLIC PANEL
ALT: 10 U/L (ref 0–35)
AST: 11 U/L (ref 0–37)
Albumin: 2.7 g/dL — ABNORMAL LOW (ref 3.5–5.2)
Alkaline Phosphatase: 80 U/L (ref 39–117)
BUN: 4 mg/dL — ABNORMAL LOW (ref 6–23)
CO2: 20 mEq/L (ref 19–32)
Calcium: 9.1 mg/dL (ref 8.4–10.5)
Chloride: 101 mEq/L (ref 96–112)
Creatinine, Ser: 0.54 mg/dL (ref 0.50–1.10)
GFR calc Af Amer: >90 mL/min (ref >90)
GFR calc non Af Amer: >90 mL/min (ref >90)
Glucose, Bld: 82 mg/dL (ref 70–99)
Potassium: 3.5 mEq/L (ref 3.5–5.1)
Sodium: 135 mEq/L (ref 135–145)
Total Bilirubin: 0.2 mg/dL — ABNORMAL LOW (ref 0.3–1.2)
Total Protein: 6.6 g/dL (ref 6.0–8.3)

## 2012-11-21 LAB — PROTEIN / CREATININE RATIO, URINE
Creatinine, Urine: 46.81 mg/dL
Protein Creatinine Ratio: 0.11 (ref 0.00–0.15)
Total Protein, Urine: 5.3 mg/dL

## 2012-11-21 NOTE — MAU Provider Note (Signed)
Chief Complaint:  Contractions  First Provider Initiated Contact with Patient 11/21/12 0154     HPI: [name] is a 32 y.o. G2P1001 at [redacted]w[redacted]d who presents to maternity admissions reporting preterm contractions every 5-6 minutes tonight. Denies HA, vision changes, epigastric pain, leakage of fluid or vaginal bleeding. Good fetal movement. In antenatal testing for [facility].   Past Medical History: Past Medical History  Diagnosis Date  . Hyperlipidemia   . Hypertension   . Pregnancy induced hypertension 2008  . HA (headache)   . Abnormal Pap smear   . Endometriosis     Past obstetric history: OB History   Grav Para Term Preterm Abortions TAB SAB Ect Mult Living   2 1 1       1      # Outc Date GA Lbr Len/2nd Wgt Sex Del Anes PTL Lv   1 TRM 2008 [redacted]w[redacted]d   F LTCS Spinal No Yes   Comments:  pre-e on MAG, IOL. failure to progress   2 CUR               Past Surgical History: Past Surgical History  Procedure Laterality Date  . Tonsillectomy    . Tympanostomy tube placement    . Cesarean section  2008  . Laparoscopy  12/01/2011    Procedure: LAPAROSCOPY OPERATIVE;  Surgeon: Leslie Andrea, MD;  Location: [facility];  Service: Gynecology;  Laterality: N/A;  . Esophageal biopsy  12/01/2011    Procedure: BIOPSY;  Surgeon: Leslie Andrea, MD;  Location: [facility];  Service: Gynecology;  Laterality: N/A;  . Endometrial biopsy    . Colposcopy      as teenager, results WNL    Family History: Family History  Problem Relation Age of Onset  . Hypertension Father   . Coronary artery disease Paternal Uncle   . Cancer Paternal Grandfather   . Diabetes Neg Hx     Social History: History  Substance Use Topics  . Smoking status: Never Smoker   . Smokeless tobacco: Never Used  . Alcohol Use: No    Allergies:  Allergies  Allergen Reactions  . Azithromycin Rash  . Cephalosporins Rash    Allergy noted with cefprozil (Cefzil) and cefuroxime (Ceftin)  . Penicillins Rash    Allergy  noted with Amoxicillin  . Sulfamethoxazole W-Trimethoprim Swelling and Rash    Meds:  Prescriptions prior to admission  Medication Sig Dispense Refill  . acetaminophen (TYLENOL) 325 MG tablet Take 2 tablets (650 mg total) by mouth every 6 (six) hours as needed (discomfortORtemperature> 100.5 F).  40 tablet  2  . calcium carbonate (TUMS - DOSED IN MG ELEMENTAL CALCIUM) 500 MG chewable tablet Chew 1 tablet by mouth daily.      Marland Kitchen HYDROmorphone (DILAUDID) 2 MG tablet Take 1 or 2 tablets every 6 hours as needed for head ache  40 tablet  0  . methyldopa (ALDOMET) 250 MG tablet Take 500 mg by mouth 2 (two) times daily.      Marland Kitchen NIFEdipine (PROCARDIA-XL/ADALAT CC) 30 MG 24 hr tablet Take 1 tablet (30 mg total) by mouth 2 (two) times daily.  60 tablet  2  . nitrofurantoin, macrocrystal-monohydrate, (MACROBID) 100 MG capsule Take 1 capsule (100 mg total) by mouth 2 (two) times daily.  14 capsule  0  . potassium chloride (K-DUR) 10 MEQ tablet Take 1 tablet (10 mEq total) by mouth 2 (two) times daily.  6 tablet  0  . Prenatal Vit-Fe Fumarate-FA (MULTIVITAMIN-PRENATAL)  27-0.8 MG TABS Take 1 tablet by mouth daily at 12 noon.        ROS: Pertinent findings in history of present illness.  Physical Exam  Blood pressure 142/90, pulse 108, temperature 99.1 F (37.3 C), temperature source Oral, resp. rate 20, height 5\' 5"  (1.651 m), weight 102.967 kg (227 lb), last menstrual period 11/16/2011. Patient Vitals for the past 24 hrs:  BP Temp Temp src Pulse Resp Height Weight  11/21/12 0033 142/90 mmHg - - 108 - - -  11/21/12 0018 152/94 mmHg - - 102 - - -  11/21/12 0003 147/93 mmHg - - 102 - - -  11/20/12 2349 148/93 mmHg - - 103 - - -  11/20/12 2337 146/101 mmHg 99.1 F (37.3 C) Oral 108 20 5\' 5"  (1.651 m) 102.967 kg (227 lb)    GENERAL: Well-developed, well-nourished female in no acute distress.  HEENT: normocephalic HEART: normal rate RESP: normal effort ABDOMEN: Soft, non-tender, gravid  appropriate for gestational age EXTREMITIES: Nontender, 1+ edema NEURO: alert and oriented  Dilation: Closed Effacement (%): 50 Cervical Position: Posterior Station: -3 Presentation: Vertex Exam by:: V,Belmira Daley CNM  FHT:  Baseline 145 , moderate variability, accelerations present, no decelerations Contractions: irreg, q 3-10 mins, mild   Labs: Results for orders placed during the hospital encounter of 11/20/12 (from the past 24 hour(s))  URINALYSIS, ROUTINE W REFLEX MICROSCOPIC     Status: None   Collection Time    11/20/12 11:22 PM      Result Value Range   Color, Urine YELLOW  YELLOW   APPearance CLEAR  CLEAR   Specific Gravity, Urine 1.010  1.005 - 1.030   pH 6.0  5.0 - 8.0   Glucose, UA NEGATIVE  NEGATIVE mg/dL   Hgb urine dipstick NEGATIVE  NEGATIVE   Bilirubin Urine NEGATIVE  NEGATIVE   Ketones, ur NEGATIVE  NEGATIVE mg/dL   Protein, ur NEGATIVE  NEGATIVE mg/dL   Urobilinogen, UA 0.2  0.0 - 1.0 mg/dL   Nitrite NEGATIVE  NEGATIVE   Leukocytes, UA NEGATIVE  NEGATIVE  CBC     Status: Abnormal   Collection Time    11/21/12 12:20 AM      Result Value Range   WBC 9.6  4.0 - 10.5 K/uL   RBC 3.53 (*) 3.87 - 5.11 MIL/uL   Hemoglobin 10.6 (*) 12.0 - 15.0 g/dL   HCT 16.1 (*) 09.6 - 04.5 %   MCV 87.5  78.0 - 100.0 fL   MCH 30.0  26.0 - 34.0 pg   MCHC 34.3  30.0 - 36.0 g/dL   RDW 40.9  81.1 - 91.4 %   Platelets 241  150 - 400 K/uL  COMPREHENSIVE METABOLIC PANEL     Status: Abnormal   Collection Time    11/21/12 12:20 AM      Result Value Range   Sodium 135  135 - 145 mEq/L   Potassium 3.5  3.5 - 5.1 mEq/L   Chloride 101  96 - 112 mEq/L   CO2 20  19 - 32 mEq/L   Glucose, Bld 82  70 - 99 mg/dL   BUN 4 (*) 6 - 23 mg/dL   Creatinine, Ser 7.82  0.50 - 1.10 mg/dL   Calcium 9.1  8.4 - 95.6 mg/dL   Total Protein 6.6  6.0 - 8.3 g/dL   Albumin 2.7 (*) 3.5 - 5.2 g/dL   AST 11  0 - 37 U/L   ALT 10  0 - 35 U/L  Alkaline Phosphatase 80  39 - 117 U/L   Total Bilirubin 0.2 (*)  0.3 - 1.2 mg/dL   GFR calc non Af Amer >90  >90 mL/min   GFR calc Af Amer >90  >90 mL/min    Imaging:  NA  MAU Course:   Assessment: 1. Preterm contractions, third trimester   2. Chronic hypertension in pregnancy, third trimester     Plan: Discharge home Preterm labor precautions, Pre-E precautions and fetal kick counts. Increase fluids and rest.      Follow-up Information   Follow up with [facility], [place]. On 11/25/2012.   Contact information:   [address] [place] [address] [phone]      Follow up with [facility] [place] MATERNITY ADMISSIONS. (As needed if symptoms worsen)    Contact information:   [address] [id number] [place] [address] [phone]       Medication List         acetaminophen 325 MG tablet  Commonly known as:  TYLENOL  Take 2 tablets (650 mg total) by mouth every 6 (six) hours as needed (discomfortORtemperature> 100.5 F).     calcium carbonate 500 MG chewable tablet  Commonly known as:  TUMS - dosed in mg elemental calcium  Chew 1 tablet by mouth daily.     HYDROmorphone 2 MG tablet  Commonly known as:  DILAUDID  Take 1 or 2 tablets every 6 hours as needed for head ache     methyldopa 250 MG tablet  Commonly known as:  ALDOMET  Take 500 mg by mouth 2 (two) times daily.     multivitamin-prenatal 27-0.8 MG Tabs tablet  Take 1 tablet by mouth daily at 12 noon.     NIFEdipine 30 MG 24 hr tablet  Commonly known as:  PROCARDIA-XL/ADALAT CC  Take 1 tablet (30 mg total) by mouth 2 (two) times daily.     nitrofurantoin (macrocrystal-monohydrate) 100 MG capsule  Commonly known as:  MACROBID  Take 1 capsule (100 mg total) by mouth 2 (two) times daily.     potassium chloride 10 MEQ tablet  Commonly known as:  K-DUR  Take 1 tablet (10 mEq total) by mouth 2 (two) times daily.        Dorathy Kinsman, CNM 11/21/2012 2:00 AM

## 2012-12-02 ENCOUNTER — Inpatient Hospital Stay (HOSPITAL_COMMUNITY): Payer: BC Managed Care – PPO | Admitting: Anesthesiology

## 2012-12-02 ENCOUNTER — Inpatient Hospital Stay (HOSPITAL_COMMUNITY)
Admission: AD | Admit: 2012-12-02 | Discharge: 2012-12-06 | DRG: 651 | Disposition: A | Discharge status: Home / Self Care | Payer: BC Managed Care – PPO | Source: Ambulatory Visit | Attending: Obstetrics and Gynecology | Admitting: Obstetrics and Gynecology

## 2012-12-02 ENCOUNTER — Encounter (HOSPITAL_COMMUNITY): Payer: Self-pay | Admitting: Anesthesiology

## 2012-12-02 ENCOUNTER — Encounter (HOSPITAL_COMMUNITY): Payer: Self-pay | Admitting: *Deleted

## 2012-12-02 ENCOUNTER — Encounter (HOSPITAL_COMMUNITY)
Admission: AD | Disposition: A | Discharge status: Home / Self Care | Payer: Self-pay | Source: Ambulatory Visit | Attending: Obstetrics and Gynecology

## 2012-12-02 DIAGNOSIS — IMO0002 Reserved for concepts with insufficient information to code with codable children: Principal | ICD-10-CM | POA: Diagnosis present

## 2012-12-02 DIAGNOSIS — Z302 Encounter for sterilization: Secondary | ICD-10-CM

## 2012-12-02 DIAGNOSIS — O34219 Maternal care for unspecified type scar from previous cesarean delivery: Secondary | ICD-10-CM | POA: Diagnosis present

## 2012-12-02 LAB — CBC
HCT: 30.4 % — ABNORMAL LOW (ref 36.0–46.0)
HCT: 30.9 % — ABNORMAL LOW (ref 36.0–46.0)
Hemoglobin: 10.5 g/dL — ABNORMAL LOW (ref 12.0–15.0)
Hemoglobin: 10.7 g/dL — ABNORMAL LOW (ref 12.0–15.0)
MCH: 30.2 pg (ref 26.0–34.0)
MCH: 30.3 pg (ref 26.0–34.0)
MCHC: 34.5 g/dL (ref 30.0–36.0)
MCHC: 34.6 g/dL (ref 30.0–36.0)
MCV: 87.4 fL (ref 78.0–100.0)
MCV: 87.5 fL (ref 78.0–100.0)
Platelets: 223 10*3/uL (ref 150–400)
Platelets: 228 10*3/uL (ref 150–400)
RBC: 3.48 MIL/uL — ABNORMAL LOW (ref 3.87–5.11)
RBC: 3.53 MIL/uL — ABNORMAL LOW (ref 3.87–5.11)
RDW: 13.5 % (ref 11.5–15.5)
RDW: 13.4 % (ref 11.5–15.5)
WBC: 10.4 10*3/uL (ref 4.0–10.5)
WBC: 10.1 10*3/uL (ref 4.0–10.5)

## 2012-12-02 LAB — COMPREHENSIVE METABOLIC PANEL
ALT: 9 U/L (ref 0–35)
AST: 12 U/L (ref 0–37)
Albumin: 2.6 g/dL — ABNORMAL LOW (ref 3.5–5.2)
Alkaline Phosphatase: 82 U/L (ref 39–117)
BUN: 3 mg/dL — ABNORMAL LOW (ref 6–23)
CO2: 20 mEq/L (ref 19–32)
Calcium: 9 mg/dL (ref 8.4–10.5)
Chloride: 101 mEq/L (ref 96–112)
Creatinine, Ser: 0.51 mg/dL (ref 0.50–1.10)
GFR calc Af Amer: >90 mL/min (ref >90)
GFR calc non Af Amer: >90 mL/min (ref >90)
Glucose, Bld: 92 mg/dL (ref 70–99)
Potassium: 3.3 mEq/L — ABNORMAL LOW (ref 3.5–5.1)
Sodium: 135 mEq/L (ref 135–145)
Total Bilirubin: 0.2 mg/dL — ABNORMAL LOW (ref 0.3–1.2)
Total Protein: 6.2 g/dL (ref 6.0–8.3)

## 2012-12-02 LAB — TYPE AND SCREEN
ABO/RH(D): O POS
Antibody Screen: NEGATIVE

## 2012-12-02 LAB — SYPHILIS: RPR W/REFLEX TO RPR TITER AND TREPONEMAL ANTIBODIES, TRADITIONAL SCREENING AND DIAGNOSIS ALGORITHM: RPR Ser Ql: NONREACTIVE

## 2012-12-02 LAB — URIC ACID: Uric Acid, Serum: 4.5 mg/dL (ref 2.4–7.0)

## 2012-12-02 SURGERY — Surgical Case
Anesthesia: Spinal | Site: Abdomen | Wound class: Clean Contaminated

## 2012-12-02 MED ORDER — LACTATED RINGERS IV SOLN
INTRAVENOUS | Status: DC | PRN
  Administered 2012-12-02: 18:00:00 via INTRAVENOUS

## 2012-12-02 MED ORDER — OXYTOCIN 40 UNITS IN LACTATED RINGERS INFUSION - SIMPLE MED: 62.5000 mL/h | INTRAVENOUS | Status: AC

## 2012-12-02 MED ORDER — 0.9 % SODIUM CHLORIDE (POUR BTL) OPTIME
TOPICAL | Status: DC | PRN
  Administered 2012-12-02: 1000 mL

## 2012-12-02 MED ORDER — IBUPROFEN 600 MG PO TABS
600.0000 mg | ORAL_TABLET | Freq: Four times a day (QID) | ORAL | Status: DC
Start: 2012-12-03 — End: 2012-12-06
  Administered 2012-12-03 – 2012-12-06 (×13): 600 mg via ORAL
  Filled 2012-12-02 (×13): qty 1

## 2012-12-02 MED ORDER — ONDANSETRON HCL 4 MG PO TABS: 4.0000 mg | ORAL_TABLET | ORAL | Status: DC | PRN

## 2012-12-02 MED ORDER — LACTATED RINGERS IV SOLN: 500.0000 mL | INTRAVENOUS | Status: DC | PRN

## 2012-12-02 MED ORDER — LANOLIN HYDROUS EX OINT: 1.0000 "application " | TOPICAL_OINTMENT | CUTANEOUS | Status: DC | PRN

## 2012-12-02 MED ORDER — OXYTOCIN BOLUS FROM INFUSION: 500.0000 mL | INTRAVENOUS | Status: DC

## 2012-12-02 MED ORDER — PRENATAL MULTIVITAMIN CH
1.0000 | ORAL_TABLET | Freq: Every day | ORAL | Status: DC
  Administered 2012-12-03 – 2012-12-06 (×2): 1 via ORAL
  Filled 2012-12-02 (×2): qty 1

## 2012-12-02 MED ORDER — ONDANSETRON HCL 4 MG/2ML IJ SOLN: 4.0000 mg | INTRAMUSCULAR | Status: DC | PRN

## 2012-12-02 MED ORDER — MENTHOL 3 MG MT LOZG: 1.0000 | LOZENGE | OROMUCOSAL | Status: DC | PRN

## 2012-12-02 MED ORDER — PROMETHAZINE HCL 25 MG/ML IJ SOLN: 6.2500 mg | INTRAMUSCULAR | Status: DC | PRN

## 2012-12-02 MED ORDER — LACTATED RINGERS IV SOLN
INTRAVENOUS | Status: DC
  Administered 2012-12-02: 12:00:00 via INTRAVENOUS

## 2012-12-02 MED ORDER — KETOROLAC TROMETHAMINE 30 MG/ML IJ SOLN
30.0000 mg | Freq: Four times a day (QID) | INTRAMUSCULAR | Status: AC | PRN
  Administered 2012-12-02: 30 mg via INTRAVENOUS

## 2012-12-02 MED ORDER — DIBUCAINE 1 % RE OINT: 1.0000 "application " | TOPICAL_OINTMENT | RECTAL | Status: DC | PRN

## 2012-12-02 MED ORDER — SIMETHICONE 80 MG PO CHEW: 80.0000 mg | CHEWABLE_TABLET | ORAL | Status: DC | PRN

## 2012-12-02 MED ORDER — MEPERIDINE HCL 25 MG/ML IJ SOLN: 6.2500 mg | INTRAMUSCULAR | Status: DC | PRN

## 2012-12-02 MED ORDER — MAGNESIUM SULFATE 40 G IN LACTATED RINGERS - SIMPLE
2.0000 g/h | INTRAVENOUS | Status: DC
  Administered 2012-12-02: 2 g/h via INTRAVENOUS
  Filled 2012-12-02: qty 500

## 2012-12-02 MED ORDER — OXYCODONE-ACETAMINOPHEN 5-325 MG PO TABS: 1.0000 | ORAL_TABLET | ORAL | Status: DC | PRN

## 2012-12-02 MED ORDER — LIDOCAINE HCL (PF) 1 % IJ SOLN: 30.0000 mL | INTRAMUSCULAR | Status: DC | PRN

## 2012-12-02 MED ORDER — FENTANYL CITRATE 0.05 MG/ML IJ SOLN
25.0000 ug | INTRAMUSCULAR | Status: DC | PRN
  Administered 2012-12-02 (×4): 50 ug via INTRAVENOUS

## 2012-12-02 MED ORDER — MAGNESIUM SULFATE 40 G IN LACTATED RINGERS - SIMPLE
2.0000 g/h | INTRAVENOUS | Status: DC
  Administered 2012-12-02 – 2012-12-03 (×2): 2 g/h via INTRAVENOUS
  Filled 2012-12-02 (×3): qty 500

## 2012-12-02 MED ORDER — PHENYLEPHRINE HCL 10 MG/ML IJ SOLN
INTRAMUSCULAR | Status: DC | PRN
  Administered 2012-12-02: 120 ug via INTRAVENOUS
  Administered 2012-12-02 (×3): 80 ug via INTRAVENOUS
  Administered 2012-12-02: 40 ug via INTRAVENOUS

## 2012-12-02 MED ORDER — PHENYLEPHRINE 40 MCG/ML (10ML) SYRINGE FOR IV PUSH (FOR BLOOD PRESSURE SUPPORT)
PREFILLED_SYRINGE | INTRAVENOUS | Status: AC
  Filled 2012-12-02: qty 15

## 2012-12-02 MED ORDER — DIPHENHYDRAMINE HCL 50 MG/ML IJ SOLN
12.5000 mg | INTRAMUSCULAR | Status: DC | PRN
  Administered 2012-12-02 – 2012-12-03 (×2): 12.5 mg via INTRAVENOUS
  Filled 2012-12-02: qty 1

## 2012-12-02 MED ORDER — OXYTOCIN 40 UNITS IN LACTATED RINGERS INFUSION - SIMPLE MED: 62.5000 mL/h | INTRAVENOUS | Status: DC

## 2012-12-02 MED ORDER — OXYTOCIN 10 UNIT/ML IJ SOLN
40.0000 [IU] | INTRAVENOUS | Status: DC | PRN
  Administered 2012-12-02: 40 [IU] via INTRAVENOUS

## 2012-12-02 MED ORDER — IBUPROFEN 600 MG PO TABS: 600.0000 mg | ORAL_TABLET | Freq: Four times a day (QID) | ORAL | Status: DC | PRN

## 2012-12-02 MED ORDER — MAGNESIUM SULFATE 40 G IN LACTATED RINGERS - SIMPLE: 2.0000 g/h | Freq: Once | INTRAVENOUS | Status: DC

## 2012-12-02 MED ORDER — ACETAMINOPHEN 325 MG PO TABS: 650.0000 mg | ORAL_TABLET | ORAL | Status: DC | PRN

## 2012-12-02 MED ORDER — DIPHENHYDRAMINE HCL 25 MG PO CAPS
25.0000 mg | ORAL_CAPSULE | Freq: Four times a day (QID) | ORAL | Status: DC | PRN
  Filled 2012-12-02: qty 1

## 2012-12-02 MED ORDER — ONDANSETRON HCL 4 MG/2ML IJ SOLN: 4.0000 mg | Freq: Three times a day (TID) | INTRAMUSCULAR | Status: DC | PRN

## 2012-12-02 MED ORDER — FENTANYL CITRATE 0.05 MG/ML IJ SOLN
INTRAMUSCULAR | Status: AC
  Filled 2012-12-02: qty 2

## 2012-12-02 MED ORDER — HYDROMORPHONE HCL PF 1 MG/ML IJ SOLN
INTRAMUSCULAR | Status: AC
  Administered 2012-12-02: 20:00:00
  Filled 2012-12-02: qty 1

## 2012-12-02 MED ORDER — CLINDAMYCIN PHOSPHATE 900 MG/50ML IV SOLN
INTRAVENOUS | Status: DC | PRN
  Administered 2012-12-02: 900 mg via INTRAVENOUS

## 2012-12-02 MED ORDER — NIFEDIPINE ER 30 MG PO TB24
30.0000 mg | ORAL_TABLET | Freq: Two times a day (BID) | ORAL | Status: DC
  Filled 2012-12-02 (×3): qty 1

## 2012-12-02 MED ORDER — OXYTOCIN 10 UNIT/ML IJ SOLN
INTRAMUSCULAR | Status: AC
  Filled 2012-12-02: qty 4

## 2012-12-02 MED ORDER — MORPHINE SULFATE (PF) 0.5 MG/ML IJ SOLN
INTRAMUSCULAR | Status: DC | PRN
  Administered 2012-12-02: .15 mg via INTRATHECAL

## 2012-12-02 MED ORDER — NALBUPHINE HCL 10 MG/ML IJ SOLN
5.0000 mg | INTRAMUSCULAR | Status: DC | PRN
  Filled 2012-12-02: qty 1

## 2012-12-02 MED ORDER — ONDANSETRON HCL 4 MG/2ML IJ SOLN
INTRAMUSCULAR | Status: AC
  Filled 2012-12-02: qty 2

## 2012-12-02 MED ORDER — NALOXONE HCL 0.4 MG/ML IJ SOLN: 0.4000 mg | INTRAMUSCULAR | Status: DC | PRN

## 2012-12-02 MED ORDER — METHYLDOPA 500 MG PO TABS
500.0000 mg | ORAL_TABLET | Freq: Two times a day (BID) | ORAL | Status: DC
  Filled 2012-12-02 (×2): qty 1

## 2012-12-02 MED ORDER — ONDANSETRON HCL 4 MG/2ML IJ SOLN: 4.0000 mg | Freq: Four times a day (QID) | INTRAMUSCULAR | Status: DC | PRN

## 2012-12-02 MED ORDER — TETANUS-DIPHTH-ACELL PERTUSSIS 5-2.5-18.5 LF-MCG/0.5 IM SUSP
0.5000 mL | Freq: Once | INTRAMUSCULAR | Status: DC
  Filled 2012-12-02: qty 0.5

## 2012-12-02 MED ORDER — METOCLOPRAMIDE HCL 5 MG/ML IJ SOLN: 10.0000 mg | Freq: Three times a day (TID) | INTRAMUSCULAR | Status: DC | PRN

## 2012-12-02 MED ORDER — MAGNESIUM SULFATE 40 G IN LACTATED RINGERS - SIMPLE
2.0000 g/h | INTRAVENOUS | Status: DC
  Filled 2012-12-02: qty 500

## 2012-12-02 MED ORDER — DEXTROSE 5 % IV SOLN: 1.0000 ug/kg/h | INTRAVENOUS | Status: DC | PRN

## 2012-12-02 MED ORDER — SCOPOLAMINE 1 MG/3DAYS TD PT72
MEDICATED_PATCH | TRANSDERMAL | Status: AC
  Filled 2012-12-02: qty 1

## 2012-12-02 MED ORDER — ONDANSETRON HCL 4 MG/2ML IJ SOLN
INTRAMUSCULAR | Status: DC | PRN
  Administered 2012-12-02: 4 mg via INTRAVENOUS

## 2012-12-02 MED ORDER — MAGNESIUM HYDROXIDE 400 MG/5ML PO SUSP
30.0000 mL | ORAL | Status: DC | PRN
  Filled 2012-12-02: qty 30

## 2012-12-02 MED ORDER — CITRIC ACID-SODIUM CITRATE 334-500 MG/5ML PO SOLN
30.0000 mL | ORAL | Status: DC | PRN
  Administered 2012-12-02: 30 mL via ORAL
  Filled 2012-12-02: qty 15

## 2012-12-02 MED ORDER — MORPHINE SULFATE 0.5 MG/ML IJ SOLN
INTRAMUSCULAR | Status: AC
  Filled 2012-12-02: qty 10

## 2012-12-02 MED ORDER — KETOROLAC TROMETHAMINE 30 MG/ML IJ SOLN
INTRAMUSCULAR | Status: AC
  Filled 2012-12-02: qty 1

## 2012-12-02 MED ORDER — SIMETHICONE 80 MG PO CHEW
80.0000 mg | CHEWABLE_TABLET | Freq: Three times a day (TID) | ORAL | Status: DC
  Administered 2012-12-03 – 2012-12-06 (×9): 80 mg via ORAL

## 2012-12-02 MED ORDER — SODIUM CHLORIDE 0.9 % IJ SOLN: 3.0000 mL | INTRAMUSCULAR | Status: DC | PRN

## 2012-12-02 MED ORDER — HYDROMORPHONE HCL PF 1 MG/ML IJ SOLN
0.5000 mg | INTRAMUSCULAR | Status: AC | PRN
  Administered 2012-12-02 (×2): 0.5 mg via INTRAVENOUS

## 2012-12-02 MED ORDER — CLINDAMYCIN PHOSPHATE 900 MG/50ML IV SOLN
900.0000 mg | Freq: Once | INTRAVENOUS | Status: DC
  Filled 2012-12-02: qty 50

## 2012-12-02 MED ORDER — FAMOTIDINE 20 MG PO TABS
20.0000 mg | ORAL_TABLET | Freq: Once | ORAL | Status: AC
  Administered 2012-12-02: 20 mg via ORAL
  Filled 2012-12-02: qty 1

## 2012-12-02 MED ORDER — LACTATED RINGERS IV SOLN
INTRAVENOUS | Status: AC
  Administered 2012-12-03 (×2): via INTRAVENOUS

## 2012-12-02 MED ORDER — SCOPOLAMINE 1 MG/3DAYS TD PT72
1.0000 | MEDICATED_PATCH | Freq: Once | TRANSDERMAL | Status: AC
Start: 2012-12-02 — End: 2012-12-05
  Administered 2012-12-02: 1.5 mg via TRANSDERMAL

## 2012-12-02 MED ORDER — WITCH HAZEL-GLYCERIN EX PADS: 1.0000 "application " | MEDICATED_PAD | CUTANEOUS | Status: DC | PRN

## 2012-12-02 MED ORDER — OXYCODONE-ACETAMINOPHEN 5-325 MG PO TABS
1.0000 | ORAL_TABLET | ORAL | Status: DC | PRN
  Administered 2012-12-03 (×3): 1 via ORAL
  Administered 2012-12-03: 2 via ORAL
  Administered 2012-12-03: 1 via ORAL
  Administered 2012-12-04 – 2012-12-06 (×8): 2 via ORAL
  Filled 2012-12-02: qty 1
  Filled 2012-12-02 (×2): qty 2
  Filled 2012-12-02 (×2): qty 1
  Filled 2012-12-02 (×3): qty 2
  Filled 2012-12-02: qty 1
  Filled 2012-12-02 (×4): qty 2

## 2012-12-02 MED ORDER — BUPIVACAINE IN DEXTROSE 0.75-8.25 % IT SOLN
INTRATHECAL | Status: DC | PRN
  Administered 2012-12-02: 1.6 mL via INTRATHECAL

## 2012-12-02 MED ORDER — DIPHENHYDRAMINE HCL 50 MG/ML IJ SOLN: 25.0000 mg | INTRAMUSCULAR | Status: DC | PRN

## 2012-12-02 MED ORDER — DIPHENHYDRAMINE HCL 50 MG/ML IJ SOLN
INTRAMUSCULAR | Status: AC
  Filled 2012-12-02: qty 1

## 2012-12-02 MED ORDER — ZOLPIDEM TARTRATE 5 MG PO TABS
5.0000 mg | ORAL_TABLET | Freq: Every evening | ORAL | Status: DC | PRN
  Administered 2012-12-02: 5 mg via ORAL
  Filled 2012-12-02: qty 1

## 2012-12-02 MED ORDER — MAGNESIUM SULFATE BOLUS VIA INFUSION
4.0000 g | Freq: Once | INTRAVENOUS | Status: AC
  Administered 2012-12-02: 4 g via INTRAVENOUS
  Filled 2012-12-02: qty 500

## 2012-12-02 MED ORDER — DIPHENHYDRAMINE HCL 25 MG PO CAPS
25.0000 mg | ORAL_CAPSULE | ORAL | Status: DC | PRN
  Administered 2012-12-04 – 2012-12-06 (×2): 25 mg via ORAL
  Filled 2012-12-02 (×2): qty 1

## 2012-12-02 MED ORDER — SENNOSIDES-DOCUSATE SODIUM 8.6-50 MG PO TABS
2.0000 | ORAL_TABLET | Freq: Every day | ORAL | Status: DC
  Administered 2012-12-02 – 2012-12-05 (×4): 2 via ORAL

## 2012-12-02 MED ORDER — MIDAZOLAM HCL 2 MG/2ML IJ SOLN: 0.5000 mg | Freq: Once | INTRAMUSCULAR | Status: DC | PRN

## 2012-12-02 MED ORDER — FENTANYL CITRATE 0.05 MG/ML IJ SOLN
INTRAMUSCULAR | Status: DC | PRN
  Administered 2012-12-02: 25 ug via INTRATHECAL

## 2012-12-02 MED ORDER — KETOROLAC TROMETHAMINE 30 MG/ML IJ SOLN: 30.0000 mg | Freq: Four times a day (QID) | INTRAMUSCULAR | Status: AC | PRN

## 2012-12-02 SURGICAL SUPPLY — 31 items
BENZOIN TINCTURE PRP APPL 2/3 (GAUZE/BANDAGES/DRESSINGS) ×3 IMPLANT
CLAMP CORD UMBIL (MISCELLANEOUS) ×3 IMPLANT
CLOTH BEACON ORANGE TIMEOUT ST (SAFETY) ×3 IMPLANT
CONTAINER PREFILL 10% NBF 15ML (MISCELLANEOUS) IMPLANT
DERMABOND ADVANCED (GAUZE/BANDAGES/DRESSINGS)
DERMABOND ADVANCED .7 DNX12 (GAUZE/BANDAGES/DRESSINGS) IMPLANT
DRAPE LG THREE QUARTER DISP (DRAPES) ×3 IMPLANT
DRSG OPSITE POSTOP 4X10 (GAUZE/BANDAGES/DRESSINGS) ×3 IMPLANT
DURAPREP 26ML APPLICATOR (WOUND CARE) ×3 IMPLANT
ELECT REM PT RETURN 9FT ADLT (ELECTROSURGICAL) ×3
ELECTRODE REM PT RTRN 9FT ADLT (ELECTROSURGICAL) ×2 IMPLANT
EXTRACTOR VACUUM M CUP 4 TUBE (SUCTIONS) IMPLANT
GLOVE BIO SURGEON STRL SZ7.5 (GLOVE) ×3 IMPLANT
GOWN STRL REIN XL XLG (GOWN DISPOSABLE) ×6 IMPLANT
KIT ABG SYR 3ML LUER SLIP (SYRINGE) ×3 IMPLANT
NEEDLE HYPO 25X5/8 SAFETYGLIDE (NEEDLE) ×3 IMPLANT
NS IRRIG 1000ML POUR BTL (IV SOLUTION) ×3 IMPLANT
PACK C SECTION WH (CUSTOM PROCEDURE TRAY) ×3 IMPLANT
PAD ABD 7.5X8 STRL (GAUZE/BANDAGES/DRESSINGS) ×3 IMPLANT
PAD OB MATERNITY 4.3X12.25 (PERSONAL CARE ITEMS) ×3 IMPLANT
STAPLER VISISTAT 35W (STAPLE) IMPLANT
STRIP CLOSURE SKIN 1/2X4 (GAUZE/BANDAGES/DRESSINGS) ×3 IMPLANT
SUT MNCRL 0 VIOLET CTX 36 (SUTURE) ×8 IMPLANT
SUT MONOCRYL 0 CTX 36 (SUTURE) ×4
SUT PDS AB 0 CTX 60 (SUTURE) ×3 IMPLANT
SUT PLAIN 0 NONE (SUTURE) ×3 IMPLANT
SUT PLAIN 2 0 XLH (SUTURE) ×3 IMPLANT
SUT VIC AB 4-0 KS 27 (SUTURE) ×3 IMPLANT
TOWEL OR 17X24 6PK STRL BLUE (TOWEL DISPOSABLE) ×3 IMPLANT
TRAY FOLEY CATH 14FR (SET/KITS/TRAYS/PACK) ×3 IMPLANT
WATER STERILE IRR 1000ML POUR (IV SOLUTION) IMPLANT

## 2012-12-02 NOTE — Brief Op Note (Signed)
12/02/2012  7:08 PM  PATIENT:  [name]  32 y.o. female  PRE-OPERATIVE DIAGNOSIS:  SEVERE PRE ECLAMPSIA, Previous Cesarean Section, Desires Permanent Sterilization  POST-OPERATIVE DIAGNOSIS:  SEVERE PRE ECLAMPSIA, Previous Cesarean Section, Desires Permanent Sterilization  PROCEDURE:  Procedure(s): Repeat Cesarean Section Delivery Baby Girl @ 1826, Apgars       , Bilateral Tubal Ligation (N/A)  SURGEON:  Surgeon(s) and Role:    * Leslie Andrea, MD - Primary  PHYSICIAN ASSISTANT:   ASSISTANTS: none   ANESTHESIA:   spinal  EBL:     BLOOD ADMINISTERED:none  DRAINS: Urinary Catheter (Foley)   LOCAL MEDICATIONS USED:  NONE  SPECIMEN:  Source of Specimen:  placenta, segment of bilateral tubes and ovaries  DISPOSITION OF SPECIMEN:  PATHOLOGY  COUNTS:  YES  TOURNIQUET:  * No tourniquets in log *  DICTATION: .Other Dictation: Dictation Number [id number]  PLAN OF CARE: Admit to inpatient   PATIENT DISPOSITION:  PACU - hemodynamically stable.   Delay start of Pharmacological VTE agent (>24hrs) due to surgical blood loss or risk of bleeding: not applicable

## 2012-12-02 NOTE — H&P (Signed)
[name] is a 32 y.o. female presenting for repeat cesarean section. Pregnancy complicated by HTN. She was admitted earlier in pregnancy for evaluation and treatment. Now on Procardia bid and Aldomet bid. This past weekend had BPs of 160s/100s with HA off/on and mild epigastric discomfort. Also has had increased swelling in face and hands. Denies vaginal bleeding, ROM.  Has had good FM. Maternal Medical History:  Fetal activity: Perceived fetal activity is normal.      OB History   Grav Para Term Preterm Abortions TAB SAB Ect Mult Living   2 1 1       1      Past Medical History  Diagnosis Date  . Hyperlipidemia   . Hypertension   . Pregnancy induced hypertension 2008  . HA (headache)   . Abnormal Pap smear   . Endometriosis    Past Surgical History  Procedure Laterality Date  . Tonsillectomy    . Tympanostomy tube placement    . Cesarean section  2008  . Laparoscopy  12/01/2011    Procedure: LAPAROSCOPY OPERATIVE;  Surgeon: Leslie Andrea, MD;  Location: [facility];  Service: Gynecology;  Laterality: N/A;  . Esophageal biopsy  12/01/2011    Procedure: BIOPSY;  Surgeon: Leslie Andrea, MD;  Location: [facility];  Service: Gynecology;  Laterality: N/A;  . Endometrial biopsy    . Colposcopy      as teenager, results WNL   Family History: family history includes Cancer in her paternal grandfather; Coronary artery disease in her paternal uncle; Hypertension in her father. There is no history of Diabetes. Social History:  reports that she has never smoked. She has never used smokeless tobacco. She reports that she does not drink alcohol or use illicit drugs.   Prenatal Transfer Tool  Maternal Diabetes: No Genetic Screening: Normal Maternal Ultrasounds/Referrals: Normal Fetal Ultrasounds or other Referrals:  Referred to [facility] Fetal Medicine consultation during in patient evaluation Maternal Substance Abuse:  No Significant Maternal Medications:  Meds include: Other:  Aldomet, Procardia Significant Maternal Lab Results:  None Other Comments:  None  Review of Systems  HENT:       HA on/off last 2-3 days  Eyes: Positive for blurred vision.       Some blurry vision off/on over last 2-3 days  Respiratory: Negative for shortness of breath.   Cardiovascular: Negative for chest pain.  Gastrointestinal: Positive for abdominal pain.       Mild epigastric discomfort last 2-3 days  Neurological: Positive for headaches.      Blood pressure 144/101, pulse 97, temperature 98 F (36.7 C), temperature source Oral, resp. rate 18, height 5\' 5"  (1.651 m), weight 230 lb (104.327 kg), last menstrual period 11/16/2011. Maternal Exam:  Abdomen: Patient reports the following abdominal tenderness: epigastric.  Surgical scars: low transverse.   Fetal presentation: vertex     Physical Exam  Cardiovascular: Normal rate and regular rhythm.   Respiratory: Effort normal and breath sounds normal.  GI: There is tenderness in the epigastric area.  Mild epigastric tenderness without rebound  Neurological: She displays abnormal reflex.  DTR 3+    Prenatal labs: ABO, Rh: --/--/O POS (08/18 1220) Antibody: NEG (08/18 1220) Rubella: Immune (02/25 0000) RPR: Nonreactive (02/25 0000)  HBsAg: Negative (02/25 0000)  HIV: Non-reactive (02/25 0000)  GBS:     Assessment/Plan: 32 yo G2P1 at 69 5/7 weeks with HTN and now severe preeclampsia D/W patient repeat cesarean section and risks-infection, organ damage, bleeding/transfusion-HIV/Hep, DVT/PE, pneumonia.  Magnesium sulfate for seizure prophylaxis. Last ate approximately 10am.   Keian Odriscoll II,Jaz Laningham E 12/02/2012, 1:43 PM

## 2012-12-02 NOTE — Anesthesia Procedure Notes (Signed)
Spinal  Patient location during procedure: OR Start time: 12/02/2012 6:07 PM Staffing Anesthesiologist: Angus Seller., Harrell Gave. Performed by: anesthesiologist  Preanesthetic Checklist Completed: patient identified, site marked, surgical consent, pre-op evaluation, timeout performed, IV checked, risks and benefits discussed and monitors and equipment checked Spinal Block Patient position: sitting Prep: DuraPrep Patient monitoring: heart rate, cardiac monitor, continuous pulse ox and blood pressure Approach: midline Location: L3-4 Injection technique: single-shot Needle Needle type: Sprotte  Needle gauge: 24 G Needle length: 9 cm Assessment Sensory level: T4 Additional Notes Patient identified.  Risk benefits discussed including failed block, incomplete pain control, headache, nerve damage, paralysis, blood pressure changes, nausea, vomiting, reactions to medication both toxic or allergic, and postpartum back pain.  Patient expressed understanding and wished to proceed.  All questions were answered.  Sterile technique used throughout procedure.  CSF was clear.  No parasthesia or other complications.  Please see nursing notes for vital signs.

## 2012-12-02 NOTE — Transfer of Care (Signed)
Immediate Anesthesia Transfer of Care Note  Patient: [name]  Procedure(s) Performed: Procedure(s): CESAREAN SECTION WITH BILATERAL TUBAL LIGATION  Patient Location: PACU  Anesthesia Type:Spinal  Level of Consciousness: awake, alert  and oriented  Airway & Oxygen Therapy: Patient Spontanous Breathing  Post-op Assessment: Report given to PACU RN and Post -op Vital signs reviewed and stable  Post vital signs: Reviewed and stable  Complications: No apparent anesthesia complications

## 2012-12-02 NOTE — Anesthesia Postprocedure Evaluation (Signed)
  Anesthesia Post-op Note  Patient: [name]  Procedure(s) Performed: Procedure(s): Repeat Cesarean Section Delivery Baby Girl @ 1826, Apgars 8/9, Bilateral Tubal Ligation (N/A)  Patient is awake, responsive, moving her legs, and has signs of resolution of her numbness. Pain and nausea are reasonably well controlled. Vital signs are stable and clinically acceptable. Oxygen saturation is clinically acceptable. There are no apparent anesthetic complications at this time. Patient is ready for discharge.

## 2012-12-02 NOTE — Progress Notes (Signed)
Reviewed with patient and husband C/S with BTL Risks of surgery and permanence of BTL reviewed, all questions answered

## 2012-12-02 NOTE — Anesthesia Preprocedure Evaluation (Signed)
Anesthesia Evaluation  Patient identified by MRN, date of birth, ID band Patient awake    Reviewed: Allergy & Precautions, H&P , NPO status , Patient's Chart, lab work & pertinent test results  Airway Mallampati: IV      Dental no notable dental hx.    Pulmonary neg pulmonary ROS,  breath sounds clear to auscultation  Pulmonary exam normal       Cardiovascular Exercise Tolerance: Good hypertension, negative cardio ROS  Rhythm:regular Rate:Normal     Neuro/Psych  Headaches, negative neurological ROS  negative psych ROS   GI/Hepatic negative GI ROS, Neg liver ROS,   Endo/Other  negative endocrine ROSMorbid obesity  Renal/GU negative Renal ROS  negative genitourinary   Musculoskeletal   Abdominal Normal abdominal exam  (+)   Peds  Hematology negative hematology ROS (+)   Anesthesia Other Findings   Reproductive/Obstetrics (+) Pregnancy                           Anesthesia Physical Anesthesia Plan  ASA: III and emergent  Anesthesia Plan: Spinal   Post-op Pain Management:    Induction:   Airway Management Planned:   Additional Equipment:   Intra-op Plan:   Post-operative Plan:   Informed Consent: I have reviewed the patients History and Physical, chart, labs and discussed the procedure including the risks, benefits and alternatives for the proposed anesthesia with the patient or authorized representative who has indicated his/her understanding and acceptance.     Plan Discussed with: Anesthesiologist, CRNA and Surgeon  Anesthesia Plan Comments:         Anesthesia Quick Evaluation

## 2012-12-03 ENCOUNTER — Encounter (HOSPITAL_COMMUNITY): Payer: Self-pay | Admitting: Anesthesiology

## 2012-12-03 ENCOUNTER — Encounter (HOSPITAL_COMMUNITY): Payer: Self-pay | Admitting: Obstetrics and Gynecology

## 2012-12-03 LAB — COMPREHENSIVE METABOLIC PANEL
ALT: 8 U/L (ref 0–35)
AST: 14 U/L (ref 0–37)
Albumin: 2.3 g/dL — ABNORMAL LOW (ref 3.5–5.2)
Alkaline Phosphatase: 73 U/L (ref 39–117)
BUN: 4 mg/dL — ABNORMAL LOW (ref 6–23)
CO2: 25 mEq/L (ref 19–32)
Calcium: 7.7 mg/dL — ABNORMAL LOW (ref 8.4–10.5)
Chloride: 100 mEq/L (ref 96–112)
Creatinine, Ser: 0.61 mg/dL (ref 0.50–1.10)
GFR calc Af Amer: >90 mL/min (ref >90)
GFR calc non Af Amer: >90 mL/min (ref >90)
Glucose, Bld: 106 mg/dL — ABNORMAL HIGH (ref 70–99)
Potassium: 3.1 mEq/L — ABNORMAL LOW (ref 3.5–5.1)
Sodium: 135 mEq/L (ref 135–145)
Total Bilirubin: 0.2 mg/dL — ABNORMAL LOW (ref 0.3–1.2)
Total Protein: 5.8 g/dL — ABNORMAL LOW (ref 6.0–8.3)

## 2012-12-03 LAB — CBC
HCT: 29.3 % — ABNORMAL LOW (ref 36.0–46.0)
Hemoglobin: 9.9 g/dL — ABNORMAL LOW (ref 12.0–15.0)
MCH: 30.2 pg (ref 26.0–34.0)
MCHC: 33.8 g/dL (ref 30.0–36.0)
MCV: 89.3 fL (ref 78.0–100.0)
Platelets: 222 10*3/uL (ref 150–400)
RBC: 3.28 MIL/uL — ABNORMAL LOW (ref 3.87–5.11)
RDW: 13.4 % (ref 11.5–15.5)
WBC: 9.6 10*3/uL (ref 4.0–10.5)

## 2012-12-03 LAB — URIC ACID: Uric Acid, Serum: 4.7 mg/dL (ref 2.4–7.0)

## 2012-12-03 MED ORDER — NIFEDIPINE ER 30 MG PO TB24
30.0000 mg | ORAL_TABLET | Freq: Every day | ORAL | Status: DC
  Administered 2012-12-03 – 2012-12-05 (×3): 30 mg via ORAL
  Filled 2012-12-03 (×3): qty 1

## 2012-12-03 MED ORDER — ATENOLOL 25 MG PO TABS
25.0000 mg | ORAL_TABLET | Freq: Two times a day (BID) | ORAL | Status: DC
  Filled 2012-12-03 (×2): qty 1

## 2012-12-03 NOTE — Progress Notes (Signed)
Subjective: Postpartum Day 1: Cesarean Delivery Patient reports incisional pain and tolerating PO.  No HA, CP/SOB, RUQ pain, or vision change.  PO pain meds working well.  Foley in with Mag running.  Objective: Vital signs in last 24 hours:  BPs nl-mild range.  UOP >100cc/hr Temp:  [97.5 F (36.4 C)-98.7 F (37.1 C)] 97.6 F (36.4 C) (08/19 0800) Pulse Rate:  [73-105] 82 (08/19 0916) Resp:  [12-37] 18 (08/19 0800) BP: (113-151)/(66-112) 137/84 mmHg (08/19 0916) SpO2:  [91 %-98 %] 96 % (08/19 0916) Weight:  [97.024 kg (213 lb 14.4 oz)-104.327 kg (230 lb)] 98.476 kg (217 lb 1.6 oz) (08/19 0600)  Physical Exam:  General: alert, cooperative and appears stated age Lochia: appropriate Uterine Fundus: firm Incision: healing well, no significant drainage, dressing on. DVT Evaluation: No evidence of DVT seen on physical exam. Negative Homan's sign. No cords or calf tenderness. 1+DTRs with no clonus.   Recent Labs  12/02/12 1647 12/03/12 0500  HGB 10.7* 9.9*  HCT 30.9* 29.3*  AM HELLP labs wnl.  Assessment/Plan: Status post Cesarean section at 35 weeks for severe PreE                                                              Doing well postoperatively.  Mag off at 8pm and likely tx to floor Continue current care.  [name] 12/03/2012, 9:32 AM

## 2012-12-03 NOTE — Lactation Note (Addendum)
This note was copied from the chart of [name]. Lactation Consultation Note     Initial consult with this mom of a NICU baby, now 13 hours post partum, and 35 6/[redacted] weeks gestation. Mom has had chronic hypertension for years, and is medications  - Aldomet and Procardia - both safe with breast feeding. Mom is also on mag drip - sever pre eclampsia. . Mom requested to hold off on pumping due to pain, but was ready when i saw her this morning. I showed mom how to hand express, and she was able to express at lest 3 mls of colostrum. Mom has evert nipples, soft breast tissue. She return demonstrated fair technique with hand expression. I set up DEP and instructed mom in it's use. Basic teaching on pumping done - NICU book on providing EBM reviewed with mom, as well as the lactation services pamphlet. Mom knows to call for questions/concerns  Patient Name: Terri Shironda Kain ZOXWR'U Date: 12/03/2012 Reason for consult: Initial assessment;NICU baby   Maternal Data Formula Feeding for Exclusion: Yes Reason for exclusion: Admission to Intensive Care Unit (ICU) post-partum (baaby in nicu) Infant to breast within first hour of birth: Yes Has patient been taught Hand Expression?: Yes Does the patient have breastfeeding experience prior to this delivery?: Yes  Feeding    LATCH Score/Interventions                      Lactation Tools Discussed/Used Tools: Pump Breast pump type: Double-Electric Breast Pump WIC Program: No (know to call ins co for DEP) Pump Review: Setup, frequency, and cleaning;Milk Storage;Other (comment) (hand expression) Initiated by:: c Shannette Tabares at 13 hours post paratum - mom was in pain and needed to wait to pump Date initiated:: 12/03/12   Consult Status Consult Status: Follow-up Date: 12/04/12 Follow-up type: In-patient    Alfred Levins 12/03/2012, 9:31 AM

## 2012-12-03 NOTE — Progress Notes (Signed)
Ur chart review completed.  

## 2012-12-03 NOTE — Op Note (Signed)
[name], [name]            ACCOUNT NO.:  [id number]  MEDICAL RECORD NO.:  [id number]  LOCATION:  9373                          FACILITY:  [facility]  PHYSICIAN:  Guy Sandifer. Henderson Cloud, M.D. DATE OF BIRTH:  09/13/1980  DATE OF PROCEDURE:  12/02/2012 DATE OF DISCHARGE:                              OPERATIVE REPORT   PREOPERATIVE DIAGNOSES: 1. Acute pregnancy at 60 and 5/7th weeks estimated gestational age. 2. Severe preeclampsia. 3. Previous cesarean section desires repeat. 4. Desires permanent sterilization.  POSTOPERATIVE DIAGNOSES: 1. Acute pregnancy at 66 and 5/7th weeks estimated gestational age. 2. Severe preeclampsia. 3. Previous cesarean section desires repeat. 4. Desires permanent sterilization.  PROCEDURE: 1. Repeat low transverse cesarean section 2. Bilateral tubal ligation.  SURGEON:  Guy Sandifer. Henderson Cloud, MD  ANESTHESIA:  Spinal.  SPECIMENS: 1. Placenta. 2. Segments of bilateral fallopian tubes to pathology.  ESTIMATED BLOOD LOSS:  750 mL.  FINDINGS:  Viable female infant, Apgars of 8 and 9 at 1 and 5 minutes respectively.  Arterial cord pH 7.32.  Birth weight pending.  INDICATIONS AND CONSENT:  This patient is a 32 year old married white female, G2, P1, who has chronic hypertension.  This has necessitated admission during this pregnancy for evaluation and treatment.  She has been managed most recently on Procardia and Aldomet.  Over the past 2-3 days, blood pressures have progressively risen and she has had blurry vision on and off, as well as headache for the past couple of days.  She has also had some mild epigastric pain.  Delivery is recommended. Potential risks, complications of the procedure were reviewed including but not limited to, infection, organ damage, bleeding requiring transfusion of blood products with HIV and hepatitis acquisition, DVT, PE, pneumonia.  Tubal ligation, alternatives, permanent failure rate, increased ectopic risk have been  reviewed for the tubal ligation. Laboratories are within normal limits for pregnancy.  All questions were answered and consents were signed on the chart.  PROCEDURE:  The patient was taken to the operating room, where she was identified.  Spinal anesthetic was placed.  She was placed in a dorsal supine position with a 15-degree left lateral wedge.  She was prepped. A Foley catheter was placed in the bladder to drain, and she was draped per [facility] protocol.  Time-out was undertaken.  A check for spinal anesthesia was made.  Skin was entered through the previous Pfannenstiel scar and dissection was carried out in layers to the peritoneum.  Peritoneum was incised, extended superiorly and inferiorly. Vesicouterine peritoneum was taken down cephalolaterally.  The bladder flap was developed.  The bladder blade was placed.  Uterus was incised in a low transverse manner.  The uterine cavity was entered bluntly with a hemostat.  Clear fluid was noted.  The uterine incision was extended cephalolaterally with fingers.  The vertex delivered without difficulty, and the baby was delivered with good cry and tone.  Cord was clamped and the infant was handed to awaiting pediatrics team.  Placenta was manually delivered and sent to pathology.  Uterine cavity was clean. Uterus was closed in 2 running locking imbricating layers of 0 Monocryl suture which achieved good hemostasis.  The left fallopian tube identified from cornua to  fimbria.  It was grasped in its mid ampullary portion with a Babcock clamp.  A knuckle of tissue was doubly ligated with 2 free ties of plain suture.  The intervening knuckle was sharply resected.  Cautery was used to obtain complete hemostasis.  Similar procedure was carried out on the right.  Inspection revealed good hemostasis all around.  There was an adhesion on the omentum to the anterior abdominal wall just to the right side of the peritoneal incision that was  about 2 cm in width.  This was taken down under good visualization with the cautery.  Anterior peritoneum was closed in a running fashion with 0 Monocryl suture which was also used to reapproximate the pyramidalis muscle in midline.  Anterior rectus fascia was closed in a running fashion with a 0 looped PDS suture. Subcutaneous tissues were reapproximated in interrupted fashion with plain suture, and the skin was closed in a subcuticular fashion with a Vicryl on a Keith needle.  Steri-Strips were applied.  Pressure dressing was applied.  All counts were correct.  The patient was taken to recovery room in stable condition.     Guy Sandifer Henderson Cloud, M.D.     JET/MEDQ  D:  12/02/2012  T:  12/03/2012  Job:  [id number]

## 2012-12-03 NOTE — Anesthesia Postprocedure Evaluation (Signed)
  Anesthesia Post-op Note  Patient: [name]  Procedure(s) Performed: Procedure(s) with comments: REPEAT CESAREAN SECTION (Bilateral) - Repeat C section edc 9/17 Surgery date changed to earlier date due to BP issues  Patient Location: A-ICU  Anesthesia Type:Spinal  Level of Consciousness: awake, alert  and oriented  Airway and Oxygen Therapy: Patient Spontanous Breathing  Post-op Pain: none  Post-op Assessment: Post-op Vital signs reviewed  Post-op Vital Signs: Reviewed and stable  Complications: No apparent anesthesia complications

## 2012-12-04 ENCOUNTER — Encounter (HOSPITAL_COMMUNITY)
Admission: RE | Admit: 2012-12-04 | Discharge: 2012-12-04 | Disposition: A | Discharge status: Home / Self Care | Payer: BC Managed Care – PPO | Source: Ambulatory Visit

## 2012-12-04 DIAGNOSIS — O923 Agalactia: Secondary | ICD-10-CM | POA: Insufficient documentation

## 2012-12-04 MED ORDER — POTASSIUM CHLORIDE CRYS ER 20 MEQ PO TBCR
20.0000 meq | EXTENDED_RELEASE_TABLET | Freq: Two times a day (BID) | ORAL | Status: AC
  Administered 2012-12-04 – 2012-12-05 (×3): 20 meq via ORAL
  Filled 2012-12-04 (×3): qty 1

## 2012-12-04 NOTE — Progress Notes (Signed)
12/04/12 1200  Clinical Encounter Type  Visited With Patient and family together (husband Kathlene November)  Visit Type Initial;Spiritual support;Social support  Spiritual Encounters  Spiritual Needs Emotional   Visited with Courtany and Kathlene November at baby's bedside in NICU.  Introduced spiritual care and chaplain availability.  Family welcomed opportunity to share and process story, especially feelings about having a baby in the NICU (different from their older babies' experiences).  We also talked about developing self-care plan as they prepare for her discharge and the emotional struggle of leaving baby here, an idea they found helpful because it gave them the power to choose something in a situation in which they have little control.  Will continue to follow for support.  [address] [place], [place] [phone]

## 2012-12-04 NOTE — Lactation Note (Signed)
This note was copied from the chart of Terri Bryonna Sundby. Lactation Consultation Note    Follow up consult with this mom of a NICU baby, now 44 hours post partum, and 36 weeks corrected gestation. Mom has been doing skin to skin, and baby has been rooting towards her breast. Due to tachypnea dn HFNC still at 3 liters, the baby is not  Able to breast feed as of yet. Mom is eager to do so, and knows to ask when baby is ready. I discussed  With mom the face that her baby is just beyond a late pre term baby, and  may or may not be able to fully breast feed on her discharge from the NICU. Mom knows to call for o/p lactation as needed. I will assist mom with latching this baby  as soon as she is able to breast feed. I told mom to switch to standard DEP setting And to pump up to 30 minutes now,util she stops dripping. Mom has lots focolostrum. Patient Name: [name] [id number] Date: 12/04/2012 Reason for consult: Follow-up assessment;NICU baby   Maternal Data    Feeding Feeding Type: Formula  LATCH Score/Interventions                      Lactation Tools Discussed/Used     Consult Status Consult Status: Follow-up Date: 12/05/12 Follow-up type: In-patient    [name] 12/04/2012, 3:14 PM

## 2012-12-04 NOTE — Progress Notes (Signed)
Subjective: Postpartum Day 3: Cesarean Delivery Patient reports incisional pain, tolerating PO and no problems voiding.  Pt reports better control with 2 percocet and motrin.  Minimal relief with only one percocet.  No CP or SOB  Objective: Vital signs in last 24 hours: Temp:  [98 F (36.7 C)-98.6 F (37 C)] 98.1 F (36.7 C) (08/20 0017) Pulse Rate:  [75-92] 84 (08/20 0525) Resp:  [16-20] 18 (08/20 0525) BP: (127-150)/(79-101) 139/79 mmHg (08/20 0525) SpO2:  [93 %-98 %] 98 % (08/20 0525) Weight:  [99.519 kg (219 lb 6.4 oz)] 99.519 kg (219 lb 6.4 oz) (08/20 0510)  Physical Exam:  General: alert and cooperative Lochia: appropriate Uterine Fundus: firm Incision: healing well DVT Evaluation: No evidence of DVT seen on physical exam.   Recent Labs  12/02/12 1647 12/03/12 0500  HGB 10.7* 9.9*  HCT 30.9* 29.3*    Assessment/Plan: Status post Cesarean section. Doing well postoperatively.  Continue current care.  Infant in NICU Plan to continue procardia and transfer to floor  [name] 12/04/2012, 8:15 AM

## 2012-12-05 MED ORDER — METHYLDOPA 500 MG PO TABS
500.0000 mg | ORAL_TABLET | Freq: Two times a day (BID) | ORAL | Status: DC
  Administered 2012-12-05: 500 mg via ORAL
  Filled 2012-12-05 (×3): qty 1

## 2012-12-05 MED ORDER — NIFEDIPINE ER 60 MG PO TB24
60.0000 mg | ORAL_TABLET | Freq: Every day | ORAL | Status: DC
  Administered 2012-12-06: 60 mg via ORAL
  Filled 2012-12-05: qty 1

## 2012-12-05 MED ORDER — NIFEDIPINE ER 30 MG PO TB24
30.0000 mg | ORAL_TABLET | Freq: Once | ORAL | Status: AC
  Administered 2012-12-05: 30 mg via ORAL
  Filled 2012-12-05: qty 1

## 2012-12-05 NOTE — Progress Notes (Signed)
Subjective: Postpartum Day 4: Cesarean Delivery Patient reports incisional pain, tolerating PO, + flatus and no problems voiding.  Denies HA, blurred vision or RUQ pain. Baby stable in NICU. (tachypnea)  Objective: Vital signs in last 24 hours: Temp:  [97.7 F (36.5 C)-98.7 F (37.1 C)] 97.7 F (36.5 C) (08/21 0634) Pulse Rate:  [68-94] 79 (08/21 0634) Resp:  [18] 18 (08/21 0634) BP: (125-158)/(72-108) 158/108 mmHg (08/21 0634) SpO2:  [94 %-99 %] 99 % (08/21 0634) Weight:  [222 lb 8 oz (100.925 kg)] 222 lb 8 oz (100.925 kg) (08/21 1610)  Physical Exam:  General: alert and cooperative Lochia: appropriate Uterine Fundus: firm Incision: honeycomb dressing with scant drainage on bandage DVT Evaluation: No evidence of DVT seen on physical exam. Negative Homan's sign. No cords or calf tenderness. No significant calf/ankle edema.   Recent Labs  12/02/12 1647 12/03/12 0500  HGB 10.7* 9.9*  HCT 30.9* 29.3*    Assessment/Plan: Status post Cesarean section. Postoperative course complicated by [facility]  Increase Procardia to 60xl q days Restart Aldomet 500 mg bid  CURTIS,CAROL G 12/05/2012, 9:07 AM

## 2012-12-05 NOTE — Progress Notes (Signed)
I visited with Dashia and her family in the NICU.  They were doing okay today and are finding good ways to bond with their baby.  They do not like to leave their baby's bedside, but they reported that they are coping well today.    [employer] Pager, [phone] 12:05 PM   12/05/12 1200  Clinical Encounter Type  Visited With Patient and family together  Visit Type Spiritual support;Follow-up

## 2012-12-06 MED ORDER — NIFEDIPINE ER 60 MG PO TB24: 60.0000 mg | ORAL_TABLET | Freq: Every day | ORAL | Status: DC

## 2012-12-06 MED ORDER — IBUPROFEN 600 MG PO TABS: 600.0000 mg | ORAL_TABLET | Freq: Four times a day (QID) | ORAL | Status: DC

## 2012-12-06 MED ORDER — OXYCODONE-ACETAMINOPHEN 5-325 MG PO TABS: 1.0000 | ORAL_TABLET | ORAL | Status: DC | PRN

## 2012-12-06 NOTE — Progress Notes (Signed)
Pt. Is discharged in the care of husband. Downstairs per ambulatorySpiritsare good. Infant to remain in Nicu.Discharge instructions with Rx were given to pt. Comprehened all instructions well. Questions were asked and answered. Abdominal  Dressing is clean and dry.

## 2012-12-06 NOTE — Progress Notes (Signed)
Clinical Social Work Department PSYCHOSOCIAL ASSESSMENT - MATERNAL/CHILD 12/05/2012  Patient:  [name], [name]  Account Number:  [id number]  Admit Date:  12/02/2012  Marjo Bicker Name:   Toula Moos [place]    Clinical Social Worker:  Lulu Riding, LCSW   Date/Time:  12/05/2012 01:00 PM  Date Referred:        Other referral source:   No referral-NICU admission    I:  FAMILY / HOME ENVIRONMENT Child's legal guardian:  PARENT  Guardian - Name Guardian - Age Guardian - Address  [name] [address] [place], [place] [address]  [name]  same   Other household support members/support persons Name Relationship DOB  Olivia DAUGHTER 6   Other support:   Parents report having a good support system of family in the area.    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 resources:  Media planner If [employer] - [place]:    School / Grade:   Maternity Care Coordinator / Child Services Coordination / Early Interventions:  Cultural issues impacting care:   None stated    III  STRENGTHS Strengths  Adequate Resources  Compliance with medical plan  Home prepared for Child (including basic supplies)  Other - See comment  Supportive family/friends  Understanding of illness   Strength comment:  Pediatric follow up will be at [facility]   IV  RISK FACTORS AND CURRENT PROBLEMS Current Problem:  None   Risk Factor & Current Problem Patient Issue Family Issue Risk Factor / Current Problem Comment   N N     V  SOCIAL WORK ASSESSMENT  CSW met with parents in MOB's third floor room to introduce myself and complete assessment for NICU admission.  Parents were quiet, but welcoming and pleasant.  They report coping well at this time and appear to have a  good understanding of baby's medical situation.  MOB states she is physically feeling better, after spending some time in AICU.  She states she had high blood pressure with her first daughter as well, but she was able to carry until 38 weeks with her and she did not require NICU care.  Parents report having everything they need for baby at home and a good support system.  MOB states she has been on bed rest since 28 weeks and her husband has done a great job of getting things prepared for baby at home.  FOB states he will be out of work for paternity leave until mid-October.  CSW explained ongoing support services offered by NICU CSW and gave contact information.  CSW discussed signs and symptoms of PPD to watch for and asked that MOB contact CSW or her doctor's office if concerns arise.  She agreed and states no issues with PPD after her first daughter.  CSW has no social concerns at this time.   VI SOCIAL WORK PLAN Social Work Plan  Psychosocial Support/Ongoing Assessment of Needs   Type of pt/family education:   PPD signs and symptoms   If child protective services report - county:   If child protective services report - date:   Information/referral to community resources comment:   No referral needs noted at this time.   Other social work plan:

## 2012-12-06 NOTE — Discharge Summary (Signed)
Obstetric Discharge Summary Reason for Admission: cesarean section Prenatal Procedures: ultrasound Intrapartum Procedures: cesarean: low cervical, transverse Postpartum Procedures: none Complications-Operative and Postpartum: CHTN Hemoglobin  Date Value Range Status  12/03/2012 9.9* 12.0 - 15.0 g/dL Final     HCT  Date Value Range Status  12/03/2012 29.3* 36.0 - 46.0 % Final    Physical Exam:  General: alert and cooperative Lochia: appropriate Uterine Fundus: firm Incision: honeycomb dressing CDI DVT Evaluation: No evidence of DVT seen on physical exam. Negative Homan's sign. No cords or calf tenderness. Calf/Ankle edema is present.  Discharge Diagnoses: s/p cesarean delivery at 35 weeks  Discharge Information: Date: 12/06/2012 Activity: pelvic rest Diet: routine Medications: PNV, Ibuprofen, Percocet and procardia Condition: stable Instructions: refer to practice specific booklet Discharge to: home   Newborn Data: Live born female  Birth Weight: 6 lb 6.8 oz (2915 g) APGAR: 8, 9  Baby in NICU.  Yury Schaus G 12/06/2012, 9:19 AM

## 2012-12-09 ENCOUNTER — Ambulatory Visit: Payer: Self-pay

## 2012-12-09 NOTE — Lactation Note (Addendum)
This note was copied from the chart of Claudie Revering. Lactation Consultation Note      Follow up consult with this mom and baby. i assisted her with latching her baby for the first time, now 24 days old and 36 5/[redacted] weeks gestation. A pere and post weight was done, and audible swallows heard. She transferred 12 mls, and then was fed a bottle of formula/breast milk as supplement. Mom and baby are rooming in [place]. Triple feeding reviewed with mom. I will see her and baby prior to their discharge tomorrow, and come up with a breast feeding plan. Mom knows to come back for outpatient lactation consults, as needed. Mom reports having  sore nipples. They appear intact, no redness. i gave mom comfort gels and instructed her in their use  Patient Name: NYELA CORTINAS YNWGN'F Date: 12/09/2012     Maternal Data    Feeding Feeding Type: Breast Milk with Formula added Nipple Type: Slow - flow Length of feed: 30 min  LATCH Score/Interventions                      Lactation Tools Discussed/Used     Consult Status      Alfred Levins 12/09/2012, 5:04 PM

## 2012-12-11 ENCOUNTER — Other Ambulatory Visit (HOSPITAL_COMMUNITY): Payer: BC Managed Care – PPO

## 2012-12-13 ENCOUNTER — Inpatient Hospital Stay (HOSPITAL_COMMUNITY)
Admission: RE | Admit: 2012-12-13 | Payer: BC Managed Care – PPO | Source: Ambulatory Visit | Admitting: Obstetrics and Gynecology

## 2012-12-13 ENCOUNTER — Encounter (HOSPITAL_COMMUNITY): Admission: RE | Payer: Self-pay | Source: Ambulatory Visit

## 2012-12-13 SURGERY — Surgical Case
Anesthesia: Regional | Laterality: Bilateral

## 2012-12-31 ENCOUNTER — Encounter: Payer: Self-pay | Admitting: Internal Medicine

## 2013-01-04 ENCOUNTER — Encounter (HOSPITAL_COMMUNITY)
Admission: RE | Admit: 2013-01-04 | Discharge: 2013-01-04 | Disposition: A | Discharge status: Home / Self Care | Payer: BC Managed Care – PPO | Source: Ambulatory Visit | Attending: Obstetrics and Gynecology | Admitting: Obstetrics and Gynecology

## 2013-01-04 DIAGNOSIS — O923 Agalactia: Secondary | ICD-10-CM | POA: Insufficient documentation

## 2013-01-08 ENCOUNTER — Encounter: Payer: Self-pay | Admitting: Internal Medicine

## 2013-01-08 ENCOUNTER — Ambulatory Visit (INDEPENDENT_AMBULATORY_CARE_PROVIDER_SITE_OTHER): Payer: BC Managed Care – PPO | Admitting: Internal Medicine

## 2013-01-08 VITALS — BP 150/100 | HR 84 | Temp 98.5°F | Wt 209.0 lb

## 2013-01-08 DIAGNOSIS — I10 Essential (primary) hypertension: Secondary | ICD-10-CM

## 2013-01-08 DIAGNOSIS — M7702 Medial epicondylitis, left elbow: Secondary | ICD-10-CM | POA: Insufficient documentation

## 2013-01-08 DIAGNOSIS — M7700 Medial epicondylitis, unspecified elbow: Secondary | ICD-10-CM

## 2013-01-08 MED ORDER — TRIAMTERENE-HCTZ 37.5-25 MG PO TABS: 1.0000 | ORAL_TABLET | Freq: Every day | ORAL | Status: DC

## 2013-01-08 NOTE — Progress Notes (Signed)
Subjective:    Patient ID: [name], female    DOB: May 01, 1980, 32 y.o.   MRN: [id number]  HPI 1 month postpartum  Had been on atenolol and HCTZ before pregnancy Taken off during pregnancy  Put on labetalol---stopped due to concern for headaches Then methyldopa and added procardia Did okay on those Then it got out of control Wound up having C-section before term due to increases No proteinuria/HELLP etc  Feels okay now Still some headaches--may be due to BP still up No chest pain No SOB  1 month postpartum No longer nursing  Current Outpatient Prescriptions on File Prior to Visit  Medication Sig Dispense Refill  . NIFEdipine (PROCARDIA-XL/ADALAT CC) 60 MG 24 hr tablet Take 1 tablet (60 mg total) by mouth daily.  60 tablet  1   No current facility-administered medications on file prior to visit.    Allergies  Allergen Reactions  . Azithromycin Rash  . Cephalosporins Rash    Allergy noted with cefprozil (Cefzil) and cefuroxime (Ceftin)  . Other Itching and Rash    CHG-wipes she had to use prier to surgery   . Penicillins Rash    Allergy noted with Amoxicillin  . Sulfamethoxazole W-Trimethoprim Swelling and Rash    Past Medical History  Diagnosis Date  . Hyperlipidemia   . Hypertension   . Pregnancy induced hypertension 2008  . HA (headache)   . Abnormal Pap smear   . Endometriosis     Past Surgical History  Procedure Laterality Date  . Tonsillectomy    . Tympanostomy tube placement    . Cesarean section  2008  . Laparoscopy  12/01/2011    Procedure: LAPAROSCOPY OPERATIVE;  Surgeon: Leslie Andrea, MD;  Location: [facility] ORS;  Service: Gynecology;  Laterality: N/A;  . Esophageal biopsy  12/01/2011    Procedure: BIOPSY;  Surgeon: Leslie Andrea, MD;  Location: [facility]S;  Service: Gynecology;  Laterality: N/A;  . Endometrial biopsy    . Colposcopy      as teenager, results WNL  . Cesarean section with bilateral tubal ligation N/A 12/02/2012   Procedure: Repeat Cesarean Section Delivery Baby Girl @ 1826, Apgars 8/9, Bilateral Tubal Ligation;  Surgeon: Leslie Andrea, MD;  Location: [facility] ORS;  Service: Obstetrics;  Laterality: N/A;    Family History  Problem Relation Age of Onset  . Hypertension Father   . Coronary artery disease Paternal Uncle   . Cancer Paternal Grandfather   . Diabetes Neg Hx     History   Social History  . Marital Status: Married    Spouse Name: N/A    Number of Children: 1  . Years of Education: N/A   Occupational History  . 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 Smoking status: Never Smoker   . Smokeless tobacco: Never Used  . Alcohol Use: No  . Drug Use: No  . Sexual Activity: Not Currently    Birth Control/ Protection: None   Other Topics Concern  . Not on file   Social History Narrative  . No narrative on file   Review of Systems No edema No dizziness or syncope Sleeps okay other than having an infant Going to [facility] ortho soon--- left arm pain (?tendonitis)    Objective:   Physical Exam  Constitutional: She appears well-developed and well-nourished. No distress.  Neck:  Normal range of motion. Neck supple. No thyromegaly present.  Cardiovascular: Normal rate, regular rhythm and normal heart sounds.  Exam reveals no gallop.   No murmur heard. Pulmonary/Chest: Effort normal and breath sounds normal. No respiratory distress. She has no wheezes. She has no rales.  Musculoskeletal: She exhibits no edema.  Mild tenderness over medial left epicondyle Some pain with pronation Wrist is quiet  Lymphadenopathy:    She has no cervical adenopathy.  Psychiatric: She has a normal mood and affect. Her behavior is normal.          Assessment & Plan:

## 2013-01-08 NOTE — Assessment & Plan Note (Signed)
Discussed ice and tendon strap If not better in a couple of weeks, will keep the orthopedic appt

## 2013-01-08 NOTE — Assessment & Plan Note (Signed)
BP Readings from Last 3 Encounters:  01/08/13 150/100  12/06/12 147/94  12/06/12 147/94   Still high despite the nifedipine Discussed options (like adding ACEI or diuretic) Will add HCTZ since she was on it before

## 2013-01-09 ENCOUNTER — Ambulatory Visit: Payer: BC Managed Care – PPO

## 2013-01-09 ENCOUNTER — Encounter: Payer: Self-pay | Admitting: Internal Medicine

## 2013-01-17 ENCOUNTER — Ambulatory Visit: Payer: BC Managed Care – PPO | Admitting: Neurology

## 2013-02-20 ENCOUNTER — Other Ambulatory Visit: Payer: Self-pay

## 2013-02-21 ENCOUNTER — Ambulatory Visit: Payer: BC Managed Care – PPO | Admitting: Internal Medicine

## 2013-02-21 DIAGNOSIS — Z0289 Encounter for other administrative examinations: Secondary | ICD-10-CM

## 2013-03-07 ENCOUNTER — Emergency Department: Payer: Self-pay | Admitting: Internal Medicine

## 2013-04-07 ENCOUNTER — Encounter: Payer: Self-pay | Admitting: Internal Medicine

## 2013-04-14 ENCOUNTER — Encounter: Payer: Self-pay | Admitting: Internal Medicine

## 2013-04-24 ENCOUNTER — Emergency Department: Payer: Self-pay | Admitting: Emergency Medicine

## 2013-04-24 LAB — CBC
HCT: 40.8 % (ref 35.0–47.0)
HGB: 14.2 g/dL (ref 12.0–16.0)
MCH: 30 pg (ref 26.0–34.0)
MCHC: 34.9 g/dL (ref 32.0–36.0)
MCV: 86 fL (ref 80–100)
Platelet: 350 10*3/uL (ref 150–440)
RBC: 4.74 10*6/uL (ref 3.80–5.20)
RDW: 12.8 % (ref 11.5–14.5)
WBC: 9.6 10*3/uL (ref 3.6–11.0)

## 2013-04-24 LAB — TROPONIN I: Troponin-I: <0.02 ng/mL

## 2013-04-24 LAB — BASIC METABOLIC PANEL
Anion Gap: 6 — ABNORMAL LOW (ref 7–16)
BUN: 12 mg/dL (ref 7–18)
Calcium, Total: 8.9 mg/dL (ref 8.5–10.1)
Chloride: 100 mmol/L (ref 98–107)
Co2: 25 mmol/L (ref 21–32)
Creatinine: 0.66 mg/dL (ref 0.60–1.30)
EGFR (African American): >60
EGFR (Non-African Amer.): >60
Glucose: 96 mg/dL (ref 65–99)
Osmolality: 262 (ref 275–301)
Potassium: 3.6 mmol/L (ref 3.5–5.1)
Sodium: 131 mmol/L — ABNORMAL LOW (ref 136–145)

## 2013-04-26 ENCOUNTER — Emergency Department: Payer: Self-pay | Admitting: Emergency Medicine

## 2013-05-26 ENCOUNTER — Encounter: Payer: Self-pay | Admitting: Internal Medicine

## 2013-05-26 NOTE — Telephone Encounter (Signed)
Has had left side pain in the morning Will eventually fade away--will have a cup of coffee and sit still and it will go away Bowels have been okay Appetite is okay Sleeps okay--- seems to be comfortable She is not aware of any muscle strain  Eye doctor note reviewed---some improvement in the vision  Advised that it sounds like a muscle thing or strain Asked her to try heat on the spot in the evening to see if that helps If no better, she will call for an appointment

## 2013-05-26 NOTE — Telephone Encounter (Signed)
.  left message to have patient return my call.  

## 2013-05-27 ENCOUNTER — Encounter: Payer: Self-pay | Admitting: Internal Medicine

## 2013-05-27 ENCOUNTER — Ambulatory Visit (INDEPENDENT_AMBULATORY_CARE_PROVIDER_SITE_OTHER): Payer: BC Managed Care – PPO | Admitting: Internal Medicine

## 2013-05-27 VITALS — BP 148/90 | HR 105 | Temp 98.2°F | Wt 219.0 lb

## 2013-05-27 DIAGNOSIS — J40 Bronchitis, not specified as acute or chronic: Secondary | ICD-10-CM | POA: Insufficient documentation

## 2013-05-27 DIAGNOSIS — I10 Essential (primary) hypertension: Secondary | ICD-10-CM

## 2013-05-27 DIAGNOSIS — J209 Acute bronchitis, unspecified: Secondary | ICD-10-CM

## 2013-05-27 LAB — BASIC METABOLIC PANEL
BUN: 14 mg/dL (ref 6–23)
CO2: 25 mEq/L (ref 19–32)
Calcium: 9.5 mg/dL (ref 8.4–10.5)
Chloride: 104 mEq/L (ref 96–112)
Creatinine, Ser: 1 mg/dL (ref 0.4–1.2)
GFR: 67.04 mL/min (ref >60.00)
Glucose, Bld: 87 mg/dL (ref 70–99)
Potassium: 4.3 mEq/L (ref 3.5–5.1)
Sodium: 137 mEq/L (ref 135–145)

## 2013-05-27 MED ORDER — DOXYCYCLINE MONOHYDRATE 100 MG PO TABS: 100.0000 mg | ORAL_TABLET | Freq: Two times a day (BID) | ORAL | Status: DC

## 2013-05-27 MED ORDER — HYDROCODONE-HOMATROPINE 5-1.5 MG/5ML PO SYRP: 5.0000 mL | ORAL_SOLUTION | Freq: Every evening | ORAL | Status: DC | PRN

## 2013-05-27 NOTE — Progress Notes (Signed)
Subjective:    Patient ID: Terri KnudsenJennifer Munoz, female    DOB: [birth year], 33 y.o.   MRN: [id number]  HPI Everyone in house sick--baby and husband  Sick for about a week Chest congestion Some sore throat--really bad 2 days ago Bad cough---worse at night. Productive of deep yellow phlegm Not much head congestion or rhinorrhea Some ear pressure but not pain  No fever Some sweats at night--no chills  Tried benedryl--no help nyquil last night--not clearly helpful Delsym didn't keep her from coughing at night  BP had been running high 180's systolic Went to ER due to headache Occupational health doctor started ziac--- doing okay with this Started about a month ago  Current Outpatient Prescriptions on File Prior to Visit  Medication Sig Dispense Refill  . NIFEdipine (PROCARDIA-XL/ADALAT CC) 60 MG 24 hr tablet Take 1 tablet (60 mg total) by mouth daily.  60 tablet  1   No current facility-administered medications on file prior to visit.    Allergies  Allergen Reactions  . Azithromycin Rash  . Cephalosporins Rash    Allergy noted with cefprozil (Cefzil) and cefuroxime (Ceftin)  . Other Itching and Rash    CHG-wipes she had to use prier to surgery   . Penicillins Rash    Allergy noted with Amoxicillin  . Sulfamethoxazole-Trimethoprim Swelling and Rash    Past Medical History  Diagnosis Date  . Hyperlipidemia   . Hypertension   . Pregnancy induced hypertension 2008  . HA (headache)   . Abnormal Pap smear   . Endometriosis     Past Surgical History  Procedure Laterality Date  . Tonsillectomy    . Tympanostomy tube placement    . Cesarean section  2008  . Laparoscopy  12/01/2011    Procedure: LAPAROSCOPY OPERATIVE;  Surgeon: Leslie AndreaJames E Tomblin II, MD;  Location: [facility];  Service: Gynecology;  Laterality: N/A;  . Esophageal biopsy  12/01/2011    Procedure: BIOPSY;  Surgeon: Leslie AndreaJames E Tomblin II, MD;  Location: [facility];  Service: Gynecology;  Laterality: N/A;  . Endometrial  biopsy    . Colposcopy      as teenager, results WNL  . Cesarean section with bilateral tubal ligation N/A 12/02/2012    Procedure: Repeat Cesarean Section Delivery Baby Girl @ 1826, Apgars 8/9, Bilateral Tubal Ligation;  Surgeon: Leslie AndreaJames E Tomblin II, MD;  Location: [facility];  Service: Obstetrics;  Laterality: N/A;    Family History  Problem Relation Age of Onset  . Hypertension Father   . Coronary artery disease Paternal Uncle   . Cancer Paternal Grandfather   . Diabetes Neg Hx     History   Social History  . Marital Status: Married    Spouse Name: N/A    Number of Children: 1  . Years of Education: N/A   Occupational History  . 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 Smoking status: Never Smoker   . Smokeless tobacco: Never Used  . Alcohol Use: No  . Drug Use: No  . Sexual Activity: Not Currently    Birth Control/ Protection: None   Other Topics Concern  . Not on file   Social History Narrative  . No narrative on file   Review of Systems No rash No vomiting or diarrhea Appetite is okay     Objective:   Physical Exam  Constitutional: She  appears well-developed and well-nourished. No distress.  Some cough  HENT:  Mouth/Throat: Oropharynx is clear and moist.  Slight pharyngeal injection Mild nasal congestion TMs normal  Neck: Normal range of motion. Neck supple.  Pulmonary/Chest: Effort normal and breath sounds normal. No respiratory distress. She has no wheezes. She has no rales.  Lymphadenopathy:    She has no cervical adenopathy.          Assessment & Plan:

## 2013-05-27 NOTE — Assessment & Plan Note (Signed)
May have secondary bacterial infection Will treat with doxy due to her allergies Cough syrup also

## 2013-05-27 NOTE — Assessment & Plan Note (Signed)
BP Readings from Last 3 Encounters:  05/27/13 148/90  01/08/13 150/100  12/06/12 147/94   HCTZ alone didn't control (with CCB) Put on bisoprolol Some better Will recheck lab

## 2013-05-27 NOTE — Progress Notes (Signed)
Pre-visit discussion using our clinic review tool. No additional management support is needed unless otherwise documented below in the visit note.  

## 2013-05-28 ENCOUNTER — Telehealth: Payer: Self-pay | Admitting: Internal Medicine

## 2013-05-28 NOTE — Telephone Encounter (Signed)
Relevant patient education assigned to patient using Emmi. ° °

## 2013-06-02 ENCOUNTER — Encounter: Payer: Self-pay | Admitting: Internal Medicine

## 2013-06-02 MED ORDER — HYDROCODONE-HOMATROPINE 5-1.5 MG/5ML PO SYRP: 5.0000 mL | ORAL_SOLUTION | Freq: Every evening | ORAL | Status: DC | PRN

## 2013-06-24 ENCOUNTER — Encounter: Payer: Self-pay | Admitting: Internal Medicine

## 2013-06-24 MED ORDER — FLUCONAZOLE 150 MG PO TABS: 150.0000 mg | ORAL_TABLET | Freq: Once | ORAL | Status: DC

## 2013-06-27 ENCOUNTER — Telehealth: Payer: Self-pay | Admitting: Family Medicine

## 2013-06-27 MED ORDER — FLUCONAZOLE 150 MG PO TABS: 150.0000 mg | ORAL_TABLET | Freq: Once | ORAL | Status: DC

## 2013-06-27 NOTE — Telephone Encounter (Signed)
Called pt.  She has some improvement with diflucan. Would continue.  rx sent.  Can use a small amount of OTC hydrocortisone in the meantime externally prn along with medicated wipes.   F/u prn.  She agrees.

## 2013-07-30 ENCOUNTER — Encounter: Payer: Self-pay | Admitting: Internal Medicine

## 2013-09-10 ENCOUNTER — Encounter: Payer: Self-pay | Admitting: Internal Medicine

## 2013-09-10 MED ORDER — BENZONATATE 200 MG PO CAPS: 200.0000 mg | ORAL_CAPSULE | Freq: Three times a day (TID) | ORAL | Status: DC | PRN

## 2013-09-11 MED ORDER — HYDROCODONE-HOMATROPINE 5-1.5 MG/5ML PO SYRP: 5.0000 mL | ORAL_SOLUTION | Freq: Every evening | ORAL | Status: DC | PRN

## 2013-09-11 NOTE — Addendum Note (Signed)
Addended by: Sueanne Margarita on: 09/11/2013 11:45 AM   Modules accepted: Orders

## 2013-10-20 ENCOUNTER — Other Ambulatory Visit: Payer: Self-pay | Admitting: Obstetrics and Gynecology

## 2013-11-14 ENCOUNTER — Encounter: Payer: Self-pay | Admitting: Internal Medicine

## 2013-11-14 MED ORDER — HYDROCODONE-HOMATROPINE 5-1.5 MG/5ML PO SYRP: 5.0000 mL | ORAL_SOLUTION | Freq: Every evening | ORAL | Status: DC | PRN

## 2013-11-14 NOTE — Telephone Encounter (Signed)
Please see Mychart message.

## 2013-11-14 NOTE — Telephone Encounter (Signed)
Please set out front for her to pick up

## 2013-11-14 NOTE — Telephone Encounter (Signed)
Patient notified via Mychart and Rx placed up front for pick up. 

## 2013-12-03 ENCOUNTER — Encounter: Payer: Self-pay | Admitting: Internal Medicine

## 2013-12-11 ENCOUNTER — Encounter: Payer: Self-pay | Admitting: Internal Medicine

## 2013-12-11 ENCOUNTER — Ambulatory Visit (INDEPENDENT_AMBULATORY_CARE_PROVIDER_SITE_OTHER): Payer: BC Managed Care – PPO | Admitting: Internal Medicine

## 2013-12-11 VITALS — BP 138/82 | HR 100 | Temp 98.6°F | Wt 220.0 lb

## 2013-12-11 DIAGNOSIS — F411 Generalized anxiety disorder: Secondary | ICD-10-CM | POA: Insufficient documentation

## 2013-12-11 DIAGNOSIS — R109 Unspecified abdominal pain: Secondary | ICD-10-CM

## 2013-12-11 MED ORDER — HYOSCYAMINE SULFATE 0.125 MG SL SUBL: 0.1250 mg | SUBLINGUAL_TABLET | Freq: Two times a day (BID) | SUBLINGUAL | Status: DC | PRN

## 2013-12-11 NOTE — Progress Notes (Signed)
Subjective:    Patient ID: [name], female    DOB: [birth year], 33 y.o.   MRN: [id number]  HPI Having bad cramping abdominal pains It will "hit" her and bowl her over Can occur while driving (has to pull over) Lower abdominal on both sides  Started before hysterectomy in July--but is worse now Spells are once or twice a week No obvious relationship to eating Bowels are regular---soft and frequent When the pain comes on--it is improved with defecation Spells will last 30 seconds or so-- then more waves of pain will come on until she moves her bowels No blood in stool  No known intestinal endometriosis No apparent cycling of pain  Current Outpatient Prescriptions on File Prior to Visit  Medication Sig Dispense Refill  . bisoprolol-hydrochlorothiazide (ZIAC) 10-6.25 MG per tablet Take 2 tablets by mouth daily.        No current facility-administered medications on file prior to visit.    Allergies  Allergen Reactions  . Azithromycin Rash  . Cephalosporins Rash    Allergy noted with cefprozil (Cefzil) and cefuroxime (Ceftin)  . Other Itching and Rash    CHG-wipes she had to use prier to surgery   . Penicillins Rash    Allergy noted with Amoxicillin  . Sulfamethoxazole-Trimethoprim Swelling and Rash    Past Medical History  Diagnosis Date  . Hyperlipidemia   . Hypertension   . Pregnancy induced hypertension 2008  . HA (headache)   . Abnormal Pap smear   . Endometriosis     Past Surgical History  Procedure Laterality Date  . Tonsillectomy    . Tympanostomy tube placement    . Cesarean section  2008  . Laparoscopy  12/01/2011    Procedure: LAPAROSCOPY OPERATIVE;  Surgeon: Leslie Andrea, MD;  Location: [facility];  Service: Gynecology;  Laterality: N/A;  . Esophageal biopsy  12/01/2011    Procedure: BIOPSY;  Surgeon: Leslie Andrea, MD;  Location: [facility];  Service: Gynecology;  Laterality: N/A;  . Endometrial biopsy    . Colposcopy      as  teenager, results WNL  . Cesarean section with bilateral tubal ligation N/A 12/02/2012    Procedure: Repeat Cesarean Section Delivery Baby Girl @ 1826, Apgars 8/9, Bilateral Tubal Ligation;  Surgeon: Leslie Andrea, MD;  Location: [facility];  Service: Obstetrics;  Laterality: N/A;  . Abdominal hysterectomy  7/15    for endometriosis    Family History  Problem Relation Age of Onset  . Hypertension Father   . Coronary artery disease Paternal Uncle   . Cancer Paternal Grandfather   . Diabetes Neg Hx     History   Social History  . Marital Status: Married    Spouse Name: N/A    Number of Children: 1  . Years of Education: N/A   Occupational History  . 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 Smoking status: Never Smoker   . Smokeless tobacco: Never Used  . Alcohol Use: No  . Drug Use: No  . Sexual Activity: Not Currently    Birth Control/ Protection: None   Other Topics Concern  . Not on file   Social History Narrative  . No narrative on file   Review of Systems No fever No weight loss Appetite is fine No dysuria Has had recurrent boils--needed  antibiotics (has left over mupirocin to use). Has spot with still hard spot on upper buttock    Objective:   Physical Exam  Constitutional: She appears well-developed and well-nourished. No distress.  Pulmonary/Chest: Effort normal and breath sounds normal. No respiratory distress. She has no wheezes. She has no rales.  Abdominal: Soft. Bowel sounds are normal. She exhibits no distension and no mass. There is no rebound and no guarding.  Mild lower abdominal tenderness but not distinct  Skin:  No lesion now on buttock (got better)          Assessment & Plan:

## 2013-12-11 NOTE — Assessment & Plan Note (Signed)
Unusual severe pain that is relieved by defecation Doesn't seem to be related to food though No symptoms to suggest IBD Pattern is not IBS Preceded the hysterectomy and no obstruction so doesn't seem to be surgical complication ??persistent endometriosis?---not likely given lack of cycling and its brief, severe nature  Will proceed with GI consultation hyoscamine SL to try when it recurs

## 2013-12-11 NOTE — Progress Notes (Signed)
Pre visit review using our clinic review tool, if applicable. No additional management support is needed unless otherwise documented below in the visit note. 

## 2013-12-11 NOTE — Assessment & Plan Note (Signed)
Has been having lots of stress [place] alprazolam [place] the city doctor Didn't really seem to help (has left over) Having daily symptoms now Not sure if she is depressed May be stir crazy not working now--but hard to coordinate their shifts (since they rotated regularly) Has had some social interactions but limited Not sure if  this may be part of the abdominal pain  Will hold off on meds Needs to get out more  Consider counseling

## 2013-12-15 ENCOUNTER — Encounter: Payer: Self-pay | Admitting: Gastroenterology

## 2013-12-15 ENCOUNTER — Encounter: Payer: Self-pay | Admitting: Internal Medicine

## 2013-12-15 MED ORDER — HYDROCODONE-HOMATROPINE 5-1.5 MG/5ML PO SYRP: 5.0000 mL | ORAL_SOLUTION | Freq: Every evening | ORAL | Status: DC | PRN

## 2013-12-15 MED ORDER — BENZONATATE 200 MG PO CAPS: 200.0000 mg | ORAL_CAPSULE | Freq: Three times a day (TID) | ORAL | Status: DC | PRN

## 2013-12-26 ENCOUNTER — Ambulatory Visit: Payer: BC Managed Care – PPO | Admitting: Gastroenterology

## 2014-01-14 ENCOUNTER — Ambulatory Visit: Payer: BC Managed Care – PPO | Admitting: Internal Medicine

## 2014-01-14 DIAGNOSIS — Z0289 Encounter for other administrative examinations: Secondary | ICD-10-CM

## 2014-02-04 ENCOUNTER — Encounter: Payer: Self-pay | Admitting: Internal Medicine

## 2014-02-04 MED ORDER — FLUCONAZOLE 150 MG PO TABS: 150.0000 mg | ORAL_TABLET | Freq: Once | ORAL | Status: DC

## 2014-02-08 ENCOUNTER — Encounter: Payer: Self-pay | Admitting: Internal Medicine

## 2014-02-09 MED ORDER — HYDROCODONE-HOMATROPINE 5-1.5 MG/5ML PO SYRP: 5.0000 mL | ORAL_SOLUTION | Freq: Every evening | ORAL | Status: DC | PRN

## 2014-02-16 ENCOUNTER — Encounter: Payer: Self-pay | Admitting: Internal Medicine

## 2014-03-02 ENCOUNTER — Encounter: Payer: Self-pay | Admitting: Internal Medicine

## 2014-03-02 ENCOUNTER — Ambulatory Visit (INDEPENDENT_AMBULATORY_CARE_PROVIDER_SITE_OTHER): Payer: BC Managed Care – PPO | Admitting: Internal Medicine

## 2014-03-02 VITALS — BP 142/86 | HR 78 | Temp 98.5°F | Wt 223.0 lb

## 2014-03-02 DIAGNOSIS — S76311A Strain of muscle, fascia and tendon of the posterior muscle group at thigh level, right thigh, initial encounter: Secondary | ICD-10-CM

## 2014-03-02 NOTE — Patient Instructions (Signed)
Hamstring Strain with Rehab The hamstring muscle and tendons are vulnerable to muscle or tendon tear (strain). Hamstring tears cause pain and inflammation in the backside of the thigh, where the hamstring muscles are located. The hamstring is comprised of three muscles that are responsible for straightening the hip, bending the knee, and stabilizing the knee. These muscles are important for walking, running, and jumping. Hamstring strain is the most common injury of the thigh. Hamstring strains are classified as grade 1 or 2 strains. Grade 1 strains cause pain, but the tendon is not lengthened. Grade 2 strains include a lengthened ligament due to the ligament being stretched or partially ruptured. With grade 2 strains there is still function, although the function may be decreased.  SYMPTOMS   Pain, tenderness, swelling, warmth, or redness over the hamstring muscles, at the back of the thigh.  Pain that gets worse during and after intense activity.  A "pop" heard in the area, at the time of injury.  Muscle spasm in the hamstring muscles.  Pain or weakness with running, jumping, or bending the knee against resistance.  Crackling sound (crepitation) when the tendon is moved or touched.  Bruising (contusion) in the thigh within 48 hours of injury.  Loss of fullness of the muscle, or area of muscle bulging in the case of a complete rupture. CAUSES  A muscle strain occurs when a force is placed on the muscle or tendon that is greater than it can withstand. Common causes of injury include:  Strain from overuse or sudden increase in the frequency, duration, or intensity of activity.  Single violent blow or force to the back of the knee or the hamstring area of the thigh. RISK INCREASES WITH:  Sports that require quick starts (sprinting, racquetball, tennis).  Sports that require jumping (basketball, volleyball).  Kicking sports and water skiing.  Contact sports (soccer, football).  Poor  strength and flexibility.  Failure to warm up properly before activity.  Previous thigh, knee, or pelvis injury.  Poor exercise technique.  Poor posture. PREVENTION  Maintain physical fitness:  Strength, flexibility, and endurance.  Cardiovascular fitness.  Learn and use proper exercise technique and posture.  Wear proper fitted and padded protective equipment. PROGNOSIS  If treated properly, hamstring strains are usually curable in 2 to 6 weeks. RELATED COMPLICATIONS   Longer healing time, if not properly treated or if not given adequate time to heal.  Chronically inflamed tendon, causing persistent pain with activity that may progress to constant pain.  Recurring symptoms, if activity is resumed too soon.  Vulnerable to repeated injury (in up to 25% of cases). TREATMENT  Treatment first involves the use of ice and medication to help reduce pain and inflammation. It is also important to complete strengthening and stretching exercises, as well as modifying any activities that aggravate the symptoms. These exercises may be completed at home or with a therapist. Your caregiver may recommend the use of crutches to help reduce pain and discomfort, especially is the strain is severe enough to cause limping. If the tendon has pulled away from the bone, then surgery is necessary to reattach it. MEDICATION   If pain medicine is needed, nonsteroidal anti-inflammatory medicines (aspirin and ibuprofen), or other minor pain relievers (acetaminophen), are often advised.  Do not take pain medicine for 7 days before surgery.  Prescription pain relievers may be given if your caregiver thinks they are needed. Use only as directed and only as much as you need.  Corticosteroid injections may be   recommended. However, these injections should only be used for serious cases, as they can only be given a certain number of times.  Ointments applied to the skin may be beneficial. HEAT AND  COLD  Cold treatment (icing) relieves pain and reduces inflammation. Cold treatment should be applied for 10 to 15 minutes every 2 to 3 hours, and immediately after activity that aggravates your symptoms. Use ice packs or an ice massage.  Heat treatment may be used before performing the stretching and strengthening activities prescribed by your caregiver, physical therapist, or athletic trainer. Use a heat pack or a warm water soak. SEEK MEDICAL CARE IF:   Symptoms get worse or do not improve in 2 weeks, despite treatment.  New, unexplained symptoms develop. (Drugs used in treatment may produce side effects.) EXERCISES RANGE OF MOTION (ROM) AND STRETCHING EXERCISES - Hamstring Strain These exercises may help you when beginning to rehabilitate your injury. Your symptoms may go away with or without further involvement from your physician, physical therapist or athletic trainer. While completing these exercises, remember:   Restoring tissue flexibility helps normal motion to return to the joints. This allows healthier, less painful movement and activity.  An effective stretch should be held for at least 30 seconds.  A stretch should never be painful. You should only feel a gentle lengthening or release in the stretched tissue. STRETCH - Hamstrings, Standing  Stand or sit, and extend your right / left leg, placing your foot on a chair or foot stool.  Keep a slight arch in your low back and your hips straight forward.  Lead with your chest, and lean forward at the waist until you feel a gentle stretch in the back of your right / left knee or thigh. (When done correctly, this exercise requires leaning only a small distance.)  Hold this position for __________ seconds. Repeat __________ times. Complete this stretch __________ times per day. STRETCH - Hamstrings, Supine   Lie on your back. Loop a belt or towel over the ball of your right / left foot.  Straighten your right / left knee and  slowly pull on the belt to raise your leg. Do not allow the right / left knee to bend. Keep your opposite leg flat on the floor.  Raise the leg until you feel a gentle stretch behind your right / left knee or thigh. Hold this position for __________ seconds. Repeat __________ times. Complete this stretch __________ times per day.  STRETCH - Hamstrings, Doorway  Lie on your back with your right / left leg extended and resting on the wall, and the opposite leg flat on the ground through the door. Initially, position your bottom farther away from the wall.  Keep your right / left knee straight. If you feel a stretch behind your knee or thigh, hold this position for __________ seconds.  If you do not feel a stretch, scoot your bottom closer to the door and hold __________ seconds. Repeat __________ times. Complete this stretch __________ times per day.  STRETCH - Hamstrings/Adductors, V-Sit   Sit on the floor with your legs extended in a large "V," keeping your knees straight.  With your head and chest upright, bend at your waist reaching for your left foot to stretch your right thigh muscles.  You should feel a stretch in your right inner thigh. Hold for __________ seconds.  Return to the upright position to relax your leg muscles.  Continuing to keep your chest upright, bend straight forward at your   waist to stretch your hamstrings.  You should feel a stretch behind both of your thighs and knees. Hold for __________ seconds.  Return to the upright position to relax your leg muscles.  With your head and chest upright, bend at your waist reaching for your right foot to stretch your left thigh muscles.  You should feel a stretch in your left inner thigh. Hold for __________ seconds.  Return to the upright position to relax your leg muscles. Repeat __________ times. Complete this exercise __________ times per day.  STRENGTHENING EXERCISES - Hamstring Strain These exercises may help you  when beginning to rehabilitate your injury. They may resolve your symptoms with or without further involvement from your physician, physical therapist or athletic trainer. While completing these exercises, remember:   Muscles can gain both the endurance and the strength needed for everyday activities through controlled exercises.  Complete these exercises as instructed by your physician, physical therapist or athletic trainer. Increase the resistance and repetitions only as guided.  You may experience muscle soreness or fatigue, but the pain or discomfort you are trying to eliminate should never get worse during these exercises. If this pain does get worse, stop and make certain you are following the directions exactly. If the pain is still present after adjustments, discontinue the exercise until you can discuss the trouble with your clinician. STRENGTH - Hip Extensors, Straight Leg Raises   Lie on your stomach on a firm surface.  Tense the muscles in your buttocks to lift your right / left leg about 4 inches. If you cannot lift your leg this high without arching your back, place a pillow under your hips.  Keep your knee straight. Hold for __________ seconds.  Slowly lower your leg to the starting position and allow it to relax completely before starting the next repetition. Repeat __________ times. Complete this exercise __________ times per day.  STRENGTH - Hamstring, Isometrics   Lie on your back on a firm surface.  Bend your right / left knee approximately __________ degrees.  Dig your heel into the surface, as if you are trying to pull it toward your buttocks. Tighten the muscles in the back of your thighs to "dig" as hard as you can, without increasing any pain.  Hold this position for __________ seconds.  Release the tension gradually and allow your muscles to completely relax for __________ seconds between each exercise. Repeat __________ times. Complete this exercise __________  times per day.  STRENGTH - Hamstring, Curls   Lay on your stomach with your legs extended. (If you lay on a bed, your feet may hang over the edge.)  Tighten the muscles in the back of your thigh to bend your right / left knee up to 90 degrees. Keep your hips flat on the bed or floor.  Hold this position for __________ seconds.  Slowly lower your leg back to the starting position. Repeat __________ times. Complete this exercise __________ times per day.  OPTIONAL ANKLE WEIGHTS: Begin with ____________________, but DO NOT exceed ____________________. Increase in 1 pound/0.5 kilogram increments. Document Released: 04/03/2005 Document Revised: 06/26/2011 Document Reviewed: 07/16/2008 [employer] Patient Information 2015 [employer]. This information is not intended to replace advice given to you by your health care provider. Make sure you discuss any questions you have with your health care provider.  

## 2014-03-02 NOTE — Progress Notes (Signed)
Subjective:    Patient ID: [name], female    DOB: [birth year], 33 y.o.   MRN: [id number]  HPI   Pt presens to the clinic with low back pain x 6 days.  The pain is located on the pt's lower right side with radiation into the leg.  Pt reports the pain as sharp in nature and constant, with worsening of the pain with tension and bending over.  Pt does ride horses and rode last Sunday and the pain started on Wednesday.  Pt denies any injury, has tried a heating pad and tylenol with little relief.  Pt denies and numbness in the leg, tingling or issues going to the bathroom.  Review of Systems  Past Medical History  Diagnosis Date  . Hyperlipidemia   . Hypertension   . Pregnancy induced hypertension 2008  . HA (headache)   . Abnormal Pap smear   . Endometriosis     Current Outpatient Prescriptions  Medication Sig Dispense Refill  . bisoprolol-hydrochlorothiazide (ZIAC) 10-6.25 MG per tablet Take 2 tablets by mouth daily.      No current facility-administered medications for this visit.    Allergies  Allergen Reactions  . Azithromycin Rash  . Cephalosporins Rash    Allergy noted with cefprozil (Cefzil) and cefuroxime (Ceftin)  . Other Itching and Rash    CHG-wipes she had to use prier to surgery   . Penicillins Rash    Allergy noted with Amoxicillin  . Sulfamethoxazole-Trimethoprim Swelling and Rash    Family History  Problem Relation Age of Onset  . Hypertension Father   . Coronary artery disease Paternal Uncle   . Cancer Paternal Grandfather   . Diabetes Neg Hx     History   Social History  . Marital Status: Married    Spouse Name: N/A    Number of Children: 1  . Years of Education: N/A   Occupational History  . 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 Smoking status: Never Smoker   . Smokeless tobacco: Never Used  . Alcohol Use: No  . Drug Use: No    . Sexual Activity: Not Currently    Birth Control/ Protection: None   Other Topics Concern  . Not on file   Social History Narrative     Gastrointestinal: Denies abdominal pain, bloating, constipation, diarrhea or blood in the stool.  GU: Denies urinary or bowel incontinence.  Musculoskeletal: Positive decrease in range of motion, muscle pain and joint pain Denies difficulty with gait or swelling   No other specific complaints in a complete review of systems (except as listed in HPI above).      Objective:   Physical Exam   BP 142/86 mmHg  Pulse 78  Temp(Src) 98.5 F (36.9 C) (Oral)  Wt 223 lb (101.152 kg)  SpO2 98% Wt Readings from Last 3 Encounters:  03/02/14 223 lb (101.152 kg)  12/11/13 220 lb (99.791 kg)  05/27/13 219 lb (99.338 kg)    General: Appears her stated age, obese but well developed, well nourished in NAD.  Cardiovascular: Normal rate and rhythm. S1,S2 noted.  No murmur, rubs or gallops noted. Pulmonary/Chest: Normal effort and positive vesicular breath sounds. No respiratory distress. No wheezes, rales or ronchi noted.  Musculoskeletal: + Straight let raise on right leg.  Decrease internal rotation and external rotation of the hip.  Decreased flexion of the hip, normal extension.  Pain with palpation at the ischial tuberosity and posterior patella.  Pain with palpation of the medial and lateral head of the fibula.   BMET    Component Value Date/Time   NA 137 05/27/2013 1311   K 4.3 05/27/2013 1311   CL 104 05/27/2013 1311   CO2 25 05/27/2013 1311   GLUCOSE 87 05/27/2013 1311   BUN 14 05/27/2013 1311   CREATININE 1.0 05/27/2013 1311   CREATININE 0.51 10/22/2012 1250   CALCIUM 9.5 05/27/2013 1311   GFRNONAA >90 12/03/2012 0500   GFRAA >90 12/03/2012 0500    Lipid Panel  No results found for: CHOL, TRIG, HDL, CHOLHDL, VLDL, LDLCALC  CBC    Component Value Date/Time   WBC 9.6 12/03/2012 0500   RBC 3.28* 12/03/2012 0500   HGB 9.9* 12/03/2012  0500   HCT 29.3* 12/03/2012 0500   PLT 222 12/03/2012 0500   MCV 89.3 12/03/2012 0500   MCH 30.2 12/03/2012 0500   MCHC 33.8 12/03/2012 0500   RDW 13.4 12/03/2012 0500   LYMPHSABS 2.5 12/20/2007 2220   MONOABS 0.5 12/20/2007 2220   EOSABS 0.1 12/20/2007 2220   BASOSABS 0.0 12/20/2007 2220    Hgb A1C No results found for: HGBA1C     Assessment & Plan:  Sprain of Hamstring, right leg  -Encouraged heat and stretching in the morning and ice in the evening -OTC ibuprofen for anti-inflammatory affects.  Told pt to take on a regimen using 600-800mg  and don't exceed 2400 mg/day -Provided work note to not take part in strenuous activity for two weeks.  RTC as needed or if symptoms persist or worsen

## 2014-03-03 ENCOUNTER — Other Ambulatory Visit: Payer: Self-pay | Admitting: Internal Medicine

## 2014-03-03 MED ORDER — TRAMADOL HCL 50 MG PO TABS: 100.0000 mg | ORAL_TABLET | Freq: Three times a day (TID) | ORAL | Status: DC | PRN

## 2014-03-03 NOTE — Telephone Encounter (Signed)
rx called into pharmacy

## 2014-03-05 ENCOUNTER — Encounter: Payer: Self-pay | Admitting: Internal Medicine

## 2014-03-05 MED ORDER — FLUCONAZOLE 100 MG PO TABS: 100.0000 mg | ORAL_TABLET | Freq: Every day | ORAL | Status: DC

## 2014-03-23 ENCOUNTER — Encounter: Payer: Self-pay | Admitting: Internal Medicine

## 2014-03-23 MED ORDER — HYDROCODONE-HOMATROPINE 5-1.5 MG/5ML PO SYRP: 5.0000 mL | ORAL_SOLUTION | Freq: Every evening | ORAL | Status: DC | PRN

## 2014-03-30 ENCOUNTER — Encounter: Payer: Self-pay | Admitting: Internal Medicine

## 2014-03-31 ENCOUNTER — Encounter: Payer: Self-pay | Admitting: Internal Medicine

## 2014-03-31 MED ORDER — TRAMADOL HCL 50 MG PO TABS: 50.0000 mg | ORAL_TABLET | Freq: Two times a day (BID) | ORAL | Status: DC | PRN

## 2014-03-31 NOTE — Telephone Encounter (Signed)
Deshannon S with Dr Alphonsus SiasLetvak called in Rx

## 2014-03-31 NOTE — Telephone Encounter (Signed)
rx called into pharmacy

## 2014-04-16 ENCOUNTER — Encounter: Payer: Self-pay | Admitting: Internal Medicine

## 2014-04-17 HISTORY — PX: LAPAROSCOPIC ASSISTED VAGINAL HYSTERECTOMY: SHX5398

## 2014-04-17 HISTORY — PX: ABDOMINAL HYSTERECTOMY: SHX81

## 2014-04-28 ENCOUNTER — Emergency Department: Payer: Self-pay | Admitting: Emergency Medicine

## 2014-04-28 LAB — BASIC METABOLIC PANEL
Anion Gap: 9 (ref 7–16)
BUN: 10 mg/dL (ref 7–18)
Calcium, Total: 9.3 mg/dL (ref 8.5–10.1)
Chloride: 103 mmol/L (ref 98–107)
Co2: 24 mmol/L (ref 21–32)
Creatinine: 0.72 mg/dL (ref 0.60–1.30)
EGFR (African American): >60
EGFR (Non-African Amer.): >60
Glucose: 94 mg/dL (ref 65–99)
Osmolality: 271 (ref 275–301)
Potassium: 3.9 mmol/L (ref 3.5–5.1)
Sodium: 136 mmol/L (ref 136–145)

## 2014-04-28 LAB — CBC
HCT: 41.5 % (ref 35.0–47.0)
HGB: 13.7 g/dL (ref 12.0–16.0)
MCH: 30.2 pg (ref 26.0–34.0)
MCHC: 32.9 g/dL (ref 32.0–36.0)
MCV: 92 fL (ref 80–100)
Platelet: 340 10*3/uL (ref 150–440)
RBC: 4.53 10*6/uL (ref 3.80–5.20)
RDW: 13.4 % (ref 11.5–14.5)
WBC: 7.4 10*3/uL (ref 3.6–11.0)

## 2014-04-28 LAB — URINALYSIS, COMPLETE
Bilirubin,UR: NEGATIVE
Blood: NEGATIVE
Glucose,UR: NEGATIVE mg/dL (ref 0–75)
Ketone: NEGATIVE
Leukocyte Esterase: NEGATIVE
Nitrite: NEGATIVE
Ph: 6 (ref 4.5–8.0)
Protein: NEGATIVE
RBC,UR: <1 /HPF (ref 0–5)
Specific Gravity: 1.011 (ref 1.003–1.030)
Squamous Epithelial: 25
WBC UR: NONE SEEN /HPF (ref 0–5)

## 2014-04-28 LAB — WET PREP, GENITAL

## 2014-05-11 ENCOUNTER — Encounter: Payer: Self-pay | Admitting: Internal Medicine

## 2014-05-11 MED ORDER — HYDROCODONE-HOMATROPINE 5-1.5 MG/5ML PO SYRP: 5.0000 mL | ORAL_SOLUTION | Freq: Every evening | ORAL | Status: DC | PRN

## 2014-05-19 ENCOUNTER — Encounter: Payer: Self-pay | Admitting: Internal Medicine

## 2014-05-20 ENCOUNTER — Encounter: Payer: Self-pay | Admitting: Family Medicine

## 2014-05-20 ENCOUNTER — Ambulatory Visit (INDEPENDENT_AMBULATORY_CARE_PROVIDER_SITE_OTHER): Payer: BLUE CROSS/BLUE SHIELD | Admitting: Family Medicine

## 2014-05-20 VITALS — BP 120/82 | HR 60 | Temp 98.0°F | Wt 220.8 lb

## 2014-05-20 DIAGNOSIS — J322 Chronic ethmoidal sinusitis: Secondary | ICD-10-CM | POA: Insufficient documentation

## 2014-05-20 DIAGNOSIS — J0120 Acute ethmoidal sinusitis, unspecified: Secondary | ICD-10-CM

## 2014-05-20 MED ORDER — HYDROCODONE-HOMATROPINE 5-1.5 MG/5ML PO SYRP: 5.0000 mL | ORAL_SOLUTION | Freq: Every evening | ORAL | Status: DC | PRN

## 2014-05-20 MED ORDER — AMOXICILLIN-POT CLAVULANATE 875-125 MG PO TABS: 1.0000 | ORAL_TABLET | Freq: Two times a day (BID) | ORAL | Status: AC

## 2014-05-20 NOTE — Progress Notes (Signed)
Pre visit review using our clinic review tool, if applicable. No additional management support is needed unless otherwise documented below in the visit note. 

## 2014-05-20 NOTE — Progress Notes (Signed)
Subjective:   Patient ID: [name], female    DOB: 1981/02/07, 34 y.o.   MRN: [id number]  [name] is a pleasant 34 y.o. year old female pt of Dr. Alphonsus SiasLetvak, new to me, who presents to clinic today with Nasal Congestion and Headache  on 05/20/2014  HPI:  Several weeks of URI symptoms- 34 year old child had similar symptoms. Emailed Dr. Alphonsus SiasLetvak- he gave her rx for Hycodan on 05/11/2014. Symptoms over past 4 days have worsened-  Now has sinus pressure. No fevers or tooth pain.  Still coughing at night- non productive.  Today, sinus pressure is at its worse. Also having a sore throat.  No fever- + chills  Multiple abx allergies including PCN but does state that she can take Augmentin without issue.  She is concerned because she is having GYN surgery in 2 weeks.  Current Outpatient Prescriptions on File Prior to Visit  Medication Sig Dispense Refill  . bisoprolol-hydrochlorothiazide (ZIAC) 10-6.25 MG per tablet Take 2 tablets by mouth daily.      No current facility-administered medications on file prior to visit.    Allergies  Allergen Reactions  . Azithromycin Rash  . Cephalosporins Rash    Allergy noted with cefprozil (Cefzil) and cefuroxime (Ceftin)  . Other Itching and Rash    CHG-wipes she had to use prier to surgery   . Penicillins Rash    Allergy noted with Amoxicillin  . Sulfamethoxazole-Trimethoprim Swelling and Rash    Past Medical History  Diagnosis Date  . Hyperlipidemia   . Hypertension   . Pregnancy induced hypertension 2008  . HA (headache)   . Abnormal Pap smear   . Endometriosis     Past Surgical History  Procedure Laterality Date  . Tonsillectomy    . Tympanostomy tube placement    . Cesarean section  2008  . Laparoscopy  12/01/2011    Procedure: LAPAROSCOPY OPERATIVE;  Surgeon: Leslie AndreaJames E Tomblin II, MD;  Location: [facility];  Service: Gynecology;  Laterality: N/A;  . Esophageal biopsy  12/01/2011    Procedure: BIOPSY;  Surgeon:  Leslie AndreaJames E Tomblin II, MD;  Location: [facility];  Service: Gynecology;  Laterality: N/A;  . Endometrial biopsy    . Colposcopy      as teenager, results WNL  . Cesarean section with bilateral tubal ligation N/A 12/02/2012    Procedure: Repeat Cesarean Section Delivery Baby Girl @ 1826, Apgars 8/9, Bilateral Tubal Ligation;  Surgeon: Leslie AndreaJames E Tomblin II, MD;  Location: [facility];  Service: Obstetrics;  Laterality: N/A;  . Abdominal hysterectomy  7/15    for endometriosis    Family History  Problem Relation Age of Onset  . Hypertension Father   . Coronary artery disease Paternal Uncle   . Cancer Paternal Grandfather   . Diabetes Neg Hx     History   Social History  . Marital Status: Single    Spouse Name: N/A    Number of Children: 1  . Years of Education: N/A   Occupational History  . 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 Smoking status: Never Smoker   . Smokeless tobacco: Never Used  . Alcohol Use: No  . Drug Use: No  . Sexual Activity: Not Currently    Birth Control/ Protection: None   Other Topics Concern  . Not on file  Social History Narrative   The PMH, PSH, Social History, Family History, Medications, and allergies have been reviewed in [facility], and have been updated if relevant.   Review of Systems  Constitutional: Positive for chills. Negative for fever.  HENT: Positive for congestion, postnasal drip, rhinorrhea, sinus pressure and sore throat. Negative for ear pain, facial swelling, hearing loss, sneezing, tinnitus, trouble swallowing and voice change.   Psychiatric/Behavioral: Negative.   All other systems reviewed and are negative.      Objective:    BP 120/82 mmHg  Pulse 60  Temp(Src) 98 F (36.7 C) (Oral)  Wt 220 lb 12 oz (100.132 kg)  SpO2 97%  LMP 11/16/2011  Breastfeeding? No   Physical Exam   She appears well, vital signs are as noted. Ears  normal.  Throat and pharynx normal.  Neck supple. No adenopathy in the neck. Nose is congested. Sinuses  Tender- frontal/ethmoid, left > right. The chest is clear, without wheezes or rales.          Assessment & Plan:   No diagnosis found. No Follow-up on file.

## 2014-05-20 NOTE — Assessment & Plan Note (Signed)
New-  Given duration and progression of symptoms, will treat for bacterial sinusitis with Augmentin. Hydocan rx refilled. Call or return to clinic prn if these symptoms worsen or fail to improve as anticipated. The patient indicates understanding of these issues and agrees with the plan.

## 2014-05-23 ENCOUNTER — Inpatient Hospital Stay (HOSPITAL_COMMUNITY)
Admission: AD | Admit: 2014-05-23 | Discharge: 2014-05-23 | Disposition: A | Discharge status: Home / Self Care | Payer: BLUE CROSS/BLUE SHIELD | Source: Ambulatory Visit | Attending: Obstetrics & Gynecology | Admitting: Obstetrics & Gynecology

## 2014-05-23 ENCOUNTER — Inpatient Hospital Stay (HOSPITAL_COMMUNITY): Payer: BLUE CROSS/BLUE SHIELD

## 2014-05-23 ENCOUNTER — Encounter (HOSPITAL_COMMUNITY): Payer: Self-pay | Admitting: *Deleted

## 2014-05-23 DIAGNOSIS — Z90710 Acquired absence of both cervix and uterus: Secondary | ICD-10-CM | POA: Insufficient documentation

## 2014-05-23 DIAGNOSIS — R1032 Left lower quadrant pain: Secondary | ICD-10-CM | POA: Insufficient documentation

## 2014-05-23 DIAGNOSIS — N949 Unspecified condition associated with female genital organs and menstrual cycle: Secondary | ICD-10-CM | POA: Diagnosis present

## 2014-05-23 HISTORY — DX: Anxiety disorder, unspecified: F41.9

## 2014-05-23 LAB — URINALYSIS, ROUTINE W REFLEX MICROSCOPIC
Bilirubin Urine: NEGATIVE
Glucose, UA: NEGATIVE mg/dL
Hgb urine dipstick: NEGATIVE
Ketones, ur: NEGATIVE mg/dL
Leukocytes, UA: NEGATIVE
Nitrite: NEGATIVE
Protein, ur: NEGATIVE mg/dL
Specific Gravity, Urine: >1.03 — ABNORMAL HIGH (ref 1.005–1.030)
Urobilinogen, UA: 0.2 mg/dL (ref 0.0–1.0)
pH: 5.5 (ref 5.0–8.0)

## 2014-05-23 MED ORDER — OXYCODONE-ACETAMINOPHEN 5-325 MG PO TABS: 1.0000 | ORAL_TABLET | Freq: Four times a day (QID) | ORAL | Status: DC | PRN

## 2014-05-23 MED ORDER — KETOROLAC TROMETHAMINE 60 MG/2ML IM SOLN
60.0000 mg | Freq: Once | INTRAMUSCULAR | Status: AC
  Administered 2014-05-23: 60 mg via INTRAMUSCULAR
  Filled 2014-05-23: qty 2

## 2014-05-23 MED ORDER — PROMETHAZINE HCL 25 MG/ML IJ SOLN
25.0000 mg | INTRAMUSCULAR | Status: AC
  Administered 2014-05-23: 25 mg via INTRAMUSCULAR
  Filled 2014-05-23: qty 1

## 2014-05-23 MED ORDER — HYDROMORPHONE HCL 1 MG/ML IJ SOLN
1.0000 mg | INTRAMUSCULAR | Status: DC
Start: 2014-05-23 — End: 2014-05-23

## 2014-05-23 MED ORDER — HYDROMORPHONE HCL 1 MG/ML IJ SOLN
1.0000 mg | INTRAMUSCULAR | Status: AC
  Administered 2014-05-23: 1 mg via INTRAMUSCULAR
  Filled 2014-05-23: qty 1

## 2014-05-23 NOTE — Progress Notes (Signed)
Karen Teague-Clark PA in to discuss test results and d/c plan. .WRitten and verbal d/c instructions given and understanding voiced 

## 2014-05-23 NOTE — MAU Note (Signed)
Told pt RN would obtain w/c for her d/c. When returned to room with w/c, pt had already left.

## 2014-05-23 NOTE — MAU Note (Signed)
Have hx endometriosis. HAve cyst on R  ovary currently and have scheduled laparoscopy for 2/18. Now having pain in LLQ that fells like same pain in R ovary. Denies vag d/c. Had a small spot bleeding several days ago but none since. Had hysterectomy July 2015

## 2014-05-23 NOTE — MAU Provider Note (Signed)
History     CSN: [id number]  Arrival date and time: 05/23/14 0018   First Provider Initiated Contact with Patient 05/23/14 0143      Chief Complaint  Patient presents with  . Abdominal Pain   HPI [name] 34 y.o. [id number] nonpregnant female presents to MAU with left sided pelvic pain that began at 7pm last evening.  It's an 8/10, sharp, throbbing in LLQ "right on ovary."  It worsens with movement.  Tramadol did not help.  She has had some nausea, denies vomiting, fever, weakness, abdominal pain, vaginal pain or discharge, dysuria.  She has a h/o endometriosis, abdominal hysterectomy and known R ovarian cyst.   OB History    Gravida Para Term Preterm AB TAB SAB Ectopic Multiple Living   Past Medical History  Diagnosis Date  . Hyperlipidemia   . Hypertension   . Pregnancy induced hypertension 2008  . HA (headache)   . Abnormal Pap smear   . Endometriosis   . Anxiety   . Anxiety     Past Surgical History  Procedure Laterality Date  . Tonsillectomy    . Tympanostomy tube placement    . Cesarean section  2008  . Laparoscopy  12/01/2011    Procedure: LAPAROSCOPY OPERATIVE;  Surgeon: Leslie Andrea, MD;  Location: [facility];  Service: Gynecology;  Laterality: N/A;  . Esophageal biopsy  12/01/2011    Procedure: BIOPSY;  Surgeon: Leslie Andrea, MD;  Location: [facility];  Service: Gynecology;  Laterality: N/A;  . Endometrial biopsy    . Colposcopy      as teenager, results WNL  . Cesarean section with bilateral tubal ligation N/A 12/02/2012    Procedure: Repeat Cesarean Section Delivery Baby Girl @ 1826, Apgars 8/9, Bilateral Tubal Ligation;  Surgeon: Leslie Andrea, MD;  Location: [facility];  Service: Obstetrics;  Laterality: N/A;  . Abdominal hysterectomy  7/15    for endometriosis    Family History  Problem Relation Age of Onset  . Hypertension Father   . Coronary artery disease Paternal Uncle   . Cancer Paternal Grandfather   . Diabetes  Neg Hx     History  Substance Use Topics  . Smoking status: Never Smoker   . Smokeless tobacco: Never Used  . Alcohol Use: No    Allergies:  Allergies  Allergen Reactions  . Amoxil [Amoxicillin] Rash  . Azithromycin Rash  . Ceftin [Cefuroxime Axetil] Rash  . Other Itching    CHG-wipes she had to use prier to surgery   . Penicillins Rash  . Sulfamethoxazole-Trimethoprim Swelling and Rash    Prescriptions prior to admission  Medication Sig Dispense Refill Last Dose  . amoxicillin-clavulanate (AUGMENTIN) 875-125 MG per tablet Take 1 tablet by mouth 2 (two) times daily. 20 tablet 0 05/22/2014 at 0900  . bisoprolol-hydrochlorothiazide (ZIAC) 10-6.25 MG per tablet Take 2 tablets by mouth daily.    05/22/2014 at 0730  . HYDROcodone-homatropine (HYCODAN) 5-1.5 MG/5ML syrup Take 5 mLs by mouth at bedtime as needed for cough. (Patient not taking: Reported on 05/21/2014) 120 mL 0     ROS Pertinent ROS in HPI  Physical Exam   Blood pressure 144/90, pulse 69, temperature 98.5 F (36.9 C), resp. rate 20, height  (1.651 m), weight 221 lb 3.2 oz (100.336 kg), last menstrual period 11/16/2011, SpO2 98 %, not currently breastfeeding.  Physical Exam  Constitutional:  She is oriented to person, place, and time. She appears well-developed and well-nourished.  HENT:  Head: Normocephalic and atraumatic.  Eyes: EOM are normal.  Neck: Normal range of motion.  Cardiovascular: Normal rate and regular rhythm.   Respiratory: Effort normal and breath sounds normal. No respiratory distress.  GI: Soft. Bowel sounds are normal. She exhibits no distension. There is tenderness. There is no rebound and no guarding.  Tenderness all across lower abdomen  Genitourinary:  Generalized tenderness throughout No cervix   Musculoskeletal: Normal range of motion.  Neurological: She is alert and oriented to person, place, and time.  Skin: Skin is warm and dry.  Psychiatric: She has a normal mood and affect.    [place] Transvaginal Non-ob  05/23/2014   CLINICAL DATA:  Left lower quadrant pelvic pain. Status post hysterectomy.  EXAM: TRANSABDOMINAL AND TRANSVAGINAL ULTRASOUND OF PELVIS  TECHNIQUE: Both transabdominal and transvaginal ultrasound examinations of the pelvis were performed. Transabdominal technique was performed for global imaging of the pelvis including uterus, ovaries, adnexal regions, and pelvic cul-de-sac. It was necessary to proceed with endovaginal exam following the transabdominal exam to visualize the left and right ovary.  COMPARISON:  None  FINDINGS: Uterus and endometrium  Surgically absent.  Right ovary  Measurements: 3.0 x 1.6 x 1.9 cm. Normal appearance/no adnexal mass. Normal blood flow.  Left ovary  Measurements: 3.6 x 1.6 x 2.2 cm. Normal appearance/no adnexal mass. Normal blood flow.  Other findings  Moderate free fluid in the left adnexa adjacent in the left ovary.  IMPRESSION: 1. Moderate simple free fluid in the left adnexa adjacent to the left ovary, this may be related to a ruptured cyst. Left ovary appears normal with normal blood flow. 2. Right ovary is normal. 3. Post hysterectomy.   Electronically Signed   By: Rubye OaksMelanie  Ehinger M.D.   On: 05/23/2014 04:45   [place] Pelvis Complete  05/23/2014   CLINICAL DATA:  Left lower quadrant pelvic pain. Status post hysterectomy.  EXAM: TRANSABDOMINAL AND TRANSVAGINAL ULTRASOUND OF PELVIS  TECHNIQUE: Both transabdominal and transvaginal ultrasound examinations of the pelvis were performed. Transabdominal technique was performed for global imaging of the pelvis including uterus, ovaries, adnexal regions, and pelvic cul-de-sac. It was necessary to proceed with endovaginal exam following the transabdominal exam to visualize the left and right ovary.  COMPARISON:  None  FINDINGS: Uterus and endometrium  Surgically absent.  Right ovary  Measurements: 3.0 x 1.6 x 1.9 cm. Normal appearance/no adnexal mass. Normal blood flow.  Left ovary  Measurements: 3.6 x  1.6 x 2.2 cm. Normal appearance/no adnexal mass. Normal blood flow.  Other findings  Moderate free fluid in the left adnexa adjacent in the left ovary.  IMPRESSION: 1. Moderate simple free fluid in the left adnexa adjacent to the left ovary, this may be related to a ruptured cyst. Left ovary appears normal with normal blood flow. 2. Right ovary is normal. 3. Post hysterectomy.   Electronically Signed   By: Rubye OaksMelanie  Ehinger M.D.   On: 05/23/2014 04:45   MAU Course  Procedures  MDM IM Toradol and Phenergan given to help with pain.  No relief.  IM Dilaudid 1mg  given.  Pain improved to 6/10. Discussed with Dr. Langston MaskerMorris.  She is in agreement for discharge and advises for rx of percocet for at home pain management.  Assessment and Plan  A:  1. LLQ pain    P: Discharge to home Rx for percocet F/u in clinic prn Patient may return to MAU as  needed or if her condition were to change or worsen    Bertram Denver 05/23/2014, 1:44 AM

## 2014-05-23 NOTE — Discharge Instructions (Signed)
Ovarian Cyst An ovarian cyst is a fluid-filled sac that forms on an ovary. The ovaries are small organs that produce eggs in women. Various types of cysts can form on the ovaries. Most are not cancerous. Many do not cause problems, and they often go away on their own. Some may cause symptoms and require treatment. Common types of ovarian cysts include:  Functional cysts--These cysts may occur every month during the menstrual cycle. This is normal. The cysts usually go away with the next menstrual cycle if the woman does not get pregnant. Usually, there are no symptoms with a functional cyst.  Endometrioma cysts--These cysts form from the tissue that lines the uterus. They are also called "chocolate cysts" because they become filled with blood that turns brown. This type of cyst can cause pain in the lower abdomen during intercourse and with your menstrual period.  Cystadenoma cysts--This type develops from the cells on the outside of the ovary. These cysts can get very big and cause lower abdomen pain and pain with intercourse. This type of cyst can twist on itself, cut off its blood supply, and cause severe pain. It can also easily rupture and cause a lot of pain.  Dermoid cysts--This type of cyst is sometimes found in both ovaries. These cysts may contain different kinds of body tissue, such as skin, teeth, hair, or cartilage. They usually do not cause symptoms unless they get very big.  Theca lutein cysts--These cysts occur when too much of a certain hormone (human chorionic gonadotropin) is produced and overstimulates the ovaries to produce an egg. This is most common after procedures used to assist with the conception of a baby (in vitro fertilization). CAUSES   Fertility drugs can cause a condition in which multiple large cysts are formed on the ovaries. This is called ovarian hyperstimulation syndrome.  A condition called polycystic ovary syndrome can cause hormonal imbalances that can lead to  nonfunctional ovarian cysts. SIGNS AND SYMPTOMS  Many ovarian cysts do not cause symptoms. If symptoms are present, they may include:  Pelvic pain or pressure.  Pain in the lower abdomen.  Pain during sexual intercourse.  Increasing girth (swelling) of the abdomen.  Abnormal menstrual periods.  Increasing pain with menstrual periods.  Stopping having menstrual periods without being pregnant. DIAGNOSIS  These cysts are commonly found during a routine or annual pelvic exam. Tests may be ordered to find out more about the cyst. These tests may include:  Ultrasound.  X-ray of the pelvis.  CT scan.  MRI.  Blood tests. TREATMENT  Many ovarian cysts go away on their own without treatment. Your health care provider may want to check your cyst regularly for 2-3 months to see if it changes. For women in menopause, it is particularly important to monitor a cyst closely because of the higher rate of ovarian cancer in menopausal women. When treatment is needed, it may include any of the following:  A procedure to drain the cyst (aspiration). This may be done using a long needle and ultrasound. It can also be done through a laparoscopic procedure. This involves using a thin, lighted tube with a tiny camera on the end (laparoscope) inserted through a small incision.  Surgery to remove the whole cyst. This may be done using laparoscopic surgery or an open surgery involving a larger incision in the lower abdomen.  Hormone treatment or birth control pills. These methods are sometimes used to help dissolve a cyst. HOME CARE INSTRUCTIONS   Only take over-the-counter   or prescription medicines as directed by your health care provider.  Follow up with your health care provider as directed.  Get regular pelvic exams and Pap tests. SEEK MEDICAL CARE IF:   Your periods are late, irregular, or painful, or they stop.  Your pelvic pain or abdominal pain does not go away.  Your abdomen becomes  larger or swollen.  You have pressure on your bladder or trouble emptying your bladder completely.  You have pain during sexual intercourse.  You have feelings of fullness, pressure, or discomfort in your stomach.  You lose weight for no apparent reason.  You feel generally ill.  You become constipated.  You lose your appetite.  You develop acne.  You have an increase in body and facial hair.  You are gaining weight, without changing your exercise and eating habits.  You think you are pregnant. SEEK IMMEDIATE MEDICAL CARE IF:   You have increasing abdominal pain.  You feel sick to your stomach (nauseous), and you throw up (vomit).  You develop a fever that comes on suddenly.  You have abdominal pain during a bowel movement.  Your menstrual periods become heavier than usual. MAKE SURE YOU:  Understand these instructions.  Will watch your condition.  Will get help right away if you are not doing well or get worse. Document Released: 04/03/2005 Document Revised: 04/08/2013 Document Reviewed: 12/09/2012 Exit[employer] Patient Information 2015 [employer]. This information is not intended to replace advice given to you by your health care provider. Make sure you discuss any questions you have with your health care provider.  

## 2014-05-25 ENCOUNTER — Encounter (HOSPITAL_COMMUNITY): Payer: Self-pay

## 2014-05-25 ENCOUNTER — Encounter (HOSPITAL_COMMUNITY)
Admission: RE | Admit: 2014-05-25 | Discharge: 2014-05-25 | Disposition: A | Discharge status: Home / Self Care | Payer: BLUE CROSS/BLUE SHIELD | Source: Ambulatory Visit | Attending: Obstetrics and Gynecology | Admitting: Obstetrics and Gynecology

## 2014-05-25 ENCOUNTER — Other Ambulatory Visit: Payer: Self-pay | Admitting: Obstetrics and Gynecology

## 2014-05-25 DIAGNOSIS — Z01818 Encounter for other preprocedural examination: Secondary | ICD-10-CM | POA: Diagnosis present

## 2014-05-25 DIAGNOSIS — N8320 Unspecified ovarian cysts: Secondary | ICD-10-CM | POA: Insufficient documentation

## 2014-05-25 HISTORY — DX: Other specified postprocedural states: Z98.890

## 2014-05-25 HISTORY — DX: Nausea with vomiting, unspecified: R11.2

## 2014-05-25 LAB — CBC
HCT: 38.4 % (ref 36.0–46.0)
Hemoglobin: 13.3 g/dL (ref 12.0–15.0)
MCH: 31.1 pg (ref 26.0–34.0)
MCHC: 34.6 g/dL (ref 30.0–36.0)
MCV: 89.7 fL (ref 78.0–100.0)
Platelets: 316 10*3/uL (ref 150–400)
RBC: 4.28 MIL/uL (ref 3.87–5.11)
RDW: 12.6 % (ref 11.5–15.5)
WBC: 6.4 10*3/uL (ref 4.0–10.5)

## 2014-05-25 LAB — BASIC METABOLIC PANEL
Anion gap: 7 (ref 5–15)
BUN: 12 mg/dL (ref 6–23)
CO2: 29 mmol/L (ref 19–32)
Calcium: 9.8 mg/dL (ref 8.4–10.5)
Chloride: 103 mmol/L (ref 96–112)
Creatinine, Ser: 0.71 mg/dL (ref 0.50–1.10)
GFR calc Af Amer: >90 mL/min (ref >90)
GFR calc non Af Amer: >90 mL/min (ref >90)
Glucose, Bld: 119 mg/dL — ABNORMAL HIGH (ref 70–99)
Potassium: 3.8 mmol/L (ref 3.5–5.1)
Sodium: 139 mmol/L (ref 135–145)

## 2014-05-25 NOTE — Patient Instructions (Addendum)
   Your procedure is scheduled on: Thursday, Feb 18  [facility] at: 6 AM Pick up the phone at the desk and dial [phone] and inform us of your arrival.  Please call this number if you have any problems the morning of surgery: [phone]  Remember: Do not eat or drink after midnight: Wednesday Take these medicines the morning of surgery with a SIP OF WATER:  bisoprolol-hydrochlorothiazide, klonopin  Do not wear jewelry, make-up, or FINGER nail polish No metal in your hair or on your body. Do not wear lotions, powders, perfumes.  You may wear deodorant.  Do not bring valuables to the hospital. Contacts, dentures or bridgework may not be worn into surgery.  Patients discharged on the day of surgery will not be allowed to drive home.  Home with husband [name] cell [phone].

## 2014-05-26 LAB — CYTOLOGY - PAP

## 2014-05-27 ENCOUNTER — Encounter: Payer: Self-pay | Admitting: Internal Medicine

## 2014-06-03 MED ORDER — DEXTROSE 5 % IV SOLN
INTRAVENOUS | Status: AC
  Administered 2014-06-04: 115.25 mL via INTRAVENOUS
  Filled 2014-06-03: qty 9.25

## 2014-06-03 NOTE — Anesthesia Preprocedure Evaluation (Addendum)
Anesthesia Evaluation  Patient identified by MRN, date of birth, ID band Patient awake    Reviewed: Allergy & Precautions, NPO status , Patient's Chart, lab work & pertinent test results  History of Anesthesia Complications (+) PONV and history of anesthetic complications  Airway Mallampati: III  TM Distance: >3 FB Neck ROM: Full    Dental no notable dental hx. (+) Dental Advisory Given   Pulmonary neg pulmonary ROS,  breath sounds clear to auscultation  Pulmonary exam normal       Cardiovascular hypertension, Pt. on medications Rhythm:Regular Rate:Normal     Neuro/Psych  Headaches, PSYCHIATRIC DISORDERS Anxiety    GI/Hepatic negative GI ROS, Neg liver ROS,   Endo/Other  obesity  Renal/GU negative Renal ROS  negative genitourinary   Musculoskeletal negative musculoskeletal ROS (+)   Abdominal   Peds negative pediatric ROS (+)  Hematology negative hematology ROS (+)   Anesthesia Other Findings   Reproductive/Obstetrics negative OB ROS                            Anesthesia Physical Anesthesia Plan  ASA: II  Anesthesia Plan: General   Post-op Pain Management:    Induction: Intravenous  Airway Management Planned: Oral ETT  Additional Equipment:   Intra-op Plan:   Post-operative Plan: Extubation in OR  Informed Consent: I have reviewed the patients History and Physical, chart, labs and discussed the procedure including the risks, benefits and alternatives for the proposed anesthesia with the patient or authorized representative who has indicated his/her understanding and acceptance.   Dental advisory given  Plan Discussed with: CRNA  Anesthesia Plan Comments:         Anesthesia Quick Evaluation

## 2014-06-04 ENCOUNTER — Ambulatory Visit (HOSPITAL_COMMUNITY): Payer: BLUE CROSS/BLUE SHIELD | Admitting: Anesthesiology

## 2014-06-04 ENCOUNTER — Ambulatory Visit (HOSPITAL_COMMUNITY)
Admission: RE | Admit: 2014-06-04 | Discharge: 2014-06-04 | Disposition: A | Discharge status: Home / Self Care | Payer: BLUE CROSS/BLUE SHIELD | Source: Ambulatory Visit | Attending: Obstetrics and Gynecology | Admitting: Obstetrics and Gynecology

## 2014-06-04 ENCOUNTER — Encounter (HOSPITAL_COMMUNITY)
Admission: RE | Disposition: A | Discharge status: Home / Self Care | Payer: Self-pay | Source: Ambulatory Visit | Attending: Obstetrics and Gynecology

## 2014-06-04 DIAGNOSIS — E785 Hyperlipidemia, unspecified: Secondary | ICD-10-CM | POA: Insufficient documentation

## 2014-06-04 DIAGNOSIS — N809 Endometriosis, unspecified: Secondary | ICD-10-CM | POA: Diagnosis present

## 2014-06-04 DIAGNOSIS — Z8249 Family history of ischemic heart disease and other diseases of the circulatory system: Secondary | ICD-10-CM | POA: Insufficient documentation

## 2014-06-04 DIAGNOSIS — O139 Gestational [pregnancy-induced] hypertension without significant proteinuria, unspecified trimester: Secondary | ICD-10-CM | POA: Insufficient documentation

## 2014-06-04 DIAGNOSIS — K660 Peritoneal adhesions (postprocedural) (postinfection): Secondary | ICD-10-CM | POA: Diagnosis not present

## 2014-06-04 DIAGNOSIS — Z79899 Other long term (current) drug therapy: Secondary | ICD-10-CM | POA: Diagnosis not present

## 2014-06-04 HISTORY — PX: LAPAROSCOPIC LYSIS OF ADHESIONS: SHX5905

## 2014-06-04 HISTORY — PX: LAPAROSCOPY: SHX197

## 2014-06-04 HISTORY — PX: ABLATION ON ENDOMETRIOSIS: SHX5787

## 2014-06-04 SURGERY — LAPAROSCOPY OPERATIVE
Anesthesia: General | Site: Abdomen | Laterality: Right

## 2014-06-04 MED ORDER — FENTANYL CITRATE 0.05 MG/ML IJ SOLN
INTRAMUSCULAR | Status: AC
  Filled 2014-06-04: qty 5

## 2014-06-04 MED ORDER — GLYCOPYRROLATE 0.2 MG/ML IJ SOLN
INTRAMUSCULAR | Status: AC
  Filled 2014-06-04: qty 2

## 2014-06-04 MED ORDER — ONDANSETRON HCL 4 MG/2ML IJ SOLN
INTRAMUSCULAR | Status: AC
  Filled 2014-06-04: qty 2

## 2014-06-04 MED ORDER — NEOSTIGMINE METHYLSULFATE 10 MG/10ML IV SOLN
INTRAVENOUS | Status: DC | PRN
Start: 2014-06-04 — End: 2014-06-04
  Administered 2014-06-04: 3 mg via INTRAVENOUS

## 2014-06-04 MED ORDER — BUPIVACAINE HCL (PF) 0.5 % IJ SOLN
INTRAMUSCULAR | Status: AC
  Filled 2014-06-04: qty 30

## 2014-06-04 MED ORDER — PROPOFOL 10 MG/ML IV BOLUS
INTRAVENOUS | Status: AC
  Filled 2014-06-04: qty 20

## 2014-06-04 MED ORDER — GLYCOPYRROLATE 0.2 MG/ML IJ SOLN
INTRAMUSCULAR | Status: DC | PRN
  Administered 2014-06-04: .4 mg via INTRAVENOUS

## 2014-06-04 MED ORDER — KETOROLAC TROMETHAMINE 30 MG/ML IJ SOLN
INTRAMUSCULAR | Status: DC | PRN
  Administered 2014-06-04: 30 mg via INTRAVENOUS

## 2014-06-04 MED ORDER — LIDOCAINE HCL (CARDIAC) 20 MG/ML IV SOLN
INTRAVENOUS | Status: AC
  Filled 2014-06-04: qty 5

## 2014-06-04 MED ORDER — IBUPROFEN 200 MG PO TABS: 600.0000 mg | ORAL_TABLET | Freq: Four times a day (QID) | ORAL | Status: DC | PRN

## 2014-06-04 MED ORDER — ONDANSETRON HCL 4 MG/2ML IJ SOLN
INTRAMUSCULAR | Status: AC
  Administered 2014-06-04: 4 mg via INTRAVENOUS
  Filled 2014-06-04: qty 2

## 2014-06-04 MED ORDER — DEXAMETHASONE SODIUM PHOSPHATE 4 MG/ML IJ SOLN
INTRAMUSCULAR | Status: DC | PRN
  Administered 2014-06-04: 4 mg via INTRAVENOUS

## 2014-06-04 MED ORDER — LIDOCAINE HCL (CARDIAC) 20 MG/ML IV SOLN
INTRAVENOUS | Status: DC | PRN
  Administered 2014-06-04: 60 mg via INTRAVENOUS

## 2014-06-04 MED ORDER — SCOPOLAMINE 1 MG/3DAYS TD PT72
MEDICATED_PATCH | TRANSDERMAL | Status: AC
  Administered 2014-06-04: 1.5 mg via TRANSDERMAL
  Filled 2014-06-04: qty 1

## 2014-06-04 MED ORDER — MIDAZOLAM HCL 2 MG/2ML IJ SOLN
INTRAMUSCULAR | Status: AC
  Filled 2014-06-04: qty 2

## 2014-06-04 MED ORDER — SODIUM CHLORIDE 0.9 % IJ SOLN
INTRAMUSCULAR | Status: DC | PRN
  Administered 2014-06-04: 10 mL

## 2014-06-04 MED ORDER — ROCURONIUM BROMIDE 100 MG/10ML IV SOLN
INTRAVENOUS | Status: DC | PRN
  Administered 2014-06-04: 40 mg via INTRAVENOUS

## 2014-06-04 MED ORDER — ONDANSETRON HCL 4 MG/2ML IJ SOLN
4.0000 mg | Freq: Once | INTRAMUSCULAR | Status: AC | PRN
  Administered 2014-06-04: 4 mg via INTRAVENOUS

## 2014-06-04 MED ORDER — LACTATED RINGERS IR SOLN
Status: DC | PRN
  Administered 2014-06-04: 3000 mL

## 2014-06-04 MED ORDER — MIDAZOLAM HCL 2 MG/2ML IJ SOLN
INTRAMUSCULAR | Status: DC | PRN
  Administered 2014-06-04: 2 mg via INTRAVENOUS

## 2014-06-04 MED ORDER — BUPIVACAINE HCL (PF) 0.5 % IJ SOLN
INTRAMUSCULAR | Status: DC | PRN
  Administered 2014-06-04: 30 mL

## 2014-06-04 MED ORDER — PROPOFOL 10 MG/ML IV BOLUS
INTRAVENOUS | Status: DC | PRN
  Administered 2014-06-04: 200 mg via INTRAVENOUS

## 2014-06-04 MED ORDER — LACTATED RINGERS IV SOLN
INTRAVENOUS | Status: DC
  Administered 2014-06-04 (×3): via INTRAVENOUS

## 2014-06-04 MED ORDER — DEXAMETHASONE SODIUM PHOSPHATE 4 MG/ML IJ SOLN
INTRAMUSCULAR | Status: AC
  Filled 2014-06-04: qty 1

## 2014-06-04 MED ORDER — NEOSTIGMINE METHYLSULFATE 10 MG/10ML IV SOLN
INTRAVENOUS | Status: AC
  Filled 2014-06-04: qty 1

## 2014-06-04 MED ORDER — FENTANYL CITRATE 0.05 MG/ML IJ SOLN
INTRAMUSCULAR | Status: AC
  Filled 2014-06-04: qty 2

## 2014-06-04 MED ORDER — FENTANYL CITRATE 0.05 MG/ML IJ SOLN
INTRAMUSCULAR | Status: AC
  Administered 2014-06-04: 50 ug via INTRAVENOUS
  Filled 2014-06-04: qty 2

## 2014-06-04 MED ORDER — ONDANSETRON HCL 4 MG/2ML IJ SOLN
INTRAMUSCULAR | Status: DC | PRN
  Administered 2014-06-04: 4 mg via INTRAVENOUS

## 2014-06-04 MED ORDER — FENTANYL CITRATE 0.05 MG/ML IJ SOLN
INTRAMUSCULAR | Status: DC | PRN
  Administered 2014-06-04 (×2): 100 ug via INTRAVENOUS
  Administered 2014-06-04: 50 ug via INTRAVENOUS

## 2014-06-04 MED ORDER — ROCURONIUM BROMIDE 100 MG/10ML IV SOLN
INTRAVENOUS | Status: AC
  Filled 2014-06-04: qty 1

## 2014-06-04 MED ORDER — SCOPOLAMINE 1 MG/3DAYS TD PT72
1.0000 | MEDICATED_PATCH | Freq: Once | TRANSDERMAL | Status: DC
  Administered 2014-06-04: 1.5 mg via TRANSDERMAL

## 2014-06-04 MED ORDER — FENTANYL CITRATE 0.05 MG/ML IJ SOLN
25.0000 ug | INTRAMUSCULAR | Status: DC | PRN
  Administered 2014-06-04 (×3): 50 ug via INTRAVENOUS

## 2014-06-04 SURGICAL SUPPLY — 32 items
CABLE HIGH FREQUENCY MONO STRZ (ELECTRODE) ×3 IMPLANT
CATH ROBINSON RED A/P 16FR (CATHETERS) ×3 IMPLANT
CLOTH BEACON ORANGE TIMEOUT ST (SAFETY) ×3 IMPLANT
DRSG COVADERM PLUS 2X2 (GAUZE/BANDAGES/DRESSINGS) ×3 IMPLANT
DRSG OPSITE POSTOP 3X4 (GAUZE/BANDAGES/DRESSINGS) ×6 IMPLANT
GLOVE BIO SURGEON STRL SZ7 (GLOVE) ×12 IMPLANT
GLOVE BIO SURGEON STRL SZ7.5 (GLOVE) ×6 IMPLANT
GLOVE BIOGEL PI IND STRL 6 (GLOVE) ×8 IMPLANT
GLOVE BIOGEL PI IND STRL 7.5 (GLOVE) ×6 IMPLANT
GLOVE BIOGEL PI IND STRL 8 (GLOVE) ×2 IMPLANT
GLOVE BIOGEL PI INDICATOR 6 (GLOVE) ×4
GLOVE BIOGEL PI INDICATOR 7.5 (GLOVE) ×3
GLOVE BIOGEL PI INDICATOR 8 (GLOVE) ×1
GLOVE ECLIPSE 6.0 STRL STRAW (GLOVE) ×6 IMPLANT
GLOVE SURG SS PI 7.0 STRL IVOR (GLOVE) ×18 IMPLANT
LIQUID BAND (GAUZE/BANDAGES/DRESSINGS) ×3 IMPLANT
NEEDLE INSUFFLATION 120MM (ENDOMECHANICALS) ×3 IMPLANT
NS IRRIG 1000ML POUR BTL (IV SOLUTION) ×3 IMPLANT
PACK LAPAROSCOPY BASIN (CUSTOM PROCEDURE TRAY) ×3 IMPLANT
PAD POSITIONER PINK NONSTERILE (MISCELLANEOUS) ×3 IMPLANT
PROTECTOR NERVE ULNAR (MISCELLANEOUS) ×3 IMPLANT
SCISSORS LAP 5X45 EPIX DISP (ENDOMECHANICALS) ×3 IMPLANT
SET IRRIG TUBING LAPAROSCOPIC (IRRIGATION / IRRIGATOR) ×3 IMPLANT
SUT VIC AB 3-0 PS2 18 (SUTURE) ×1
SUT VIC AB 3-0 PS2 18XBRD (SUTURE) ×2 IMPLANT
SUT VICRYL 0 UR6 27IN ABS (SUTURE) ×3 IMPLANT
SYR 30ML LL (SYRINGE) ×3 IMPLANT
TOWEL OR 17X24 6PK STRL BLUE (TOWEL DISPOSABLE) ×6 IMPLANT
TROCAR XCEL NON-BLD 11X100MML (ENDOMECHANICALS) ×3 IMPLANT
TROCAR XCEL NON-BLD 5MMX100MML (ENDOMECHANICALS) ×3 IMPLANT
WARMER LAPAROSCOPE (MISCELLANEOUS) ×3 IMPLANT
WATER STERILE IRR 1000ML POUR (IV SOLUTION) ×3 IMPLANT

## 2014-06-04 NOTE — H&P (Signed)
[name] is an 34 y.o. female S/P LAVH with known endometriosis and RLQ pain. An ovarian cyst noted on U/S resolved but pain continues.  Pertinent Gynecological History: Menses: N/A Bleeding: N/A Contraception: none DES exposure: denies Blood transfusions: none Sexually transmitted diseases: recent diagnosis: none Previous GYN Procedures: LAVH  Last mammogram: N/A Date: N/A Last pap: normal Date: 74 OB History: G2, P2   Menstrual History: Menarche age: unknown  No LMP recorded.    Past Medical History  Diagnosis Date  . Hyperlipidemia     diet controlled, no meds  . Hypertension   . Pregnancy induced hypertension 2008  . Abnormal Pap smear   . Endometriosis   . Anxiety   . Anxiety   . PONV (postoperative nausea and vomiting)   . HA (headache)     otc med prn    Past Surgical History  Procedure Laterality Date  . Tonsillectomy    . Tympanostomy tube placement    . Cesarean section  2008  . Laparoscopy  12/01/2011    Procedure: LAPAROSCOPY OPERATIVE;  Surgeon: Leslie Andrea, MD;  Location: [facility];  Service: Gynecology;  Laterality: N/A;  . Esophageal biopsy  12/01/2011    Procedure: BIOPSY;  Surgeon: Leslie Andrea, MD;  Location: [facility];  Service: Gynecology;  Laterality: N/A;  . Endometrial biopsy    . Colposcopy      as teenager, results WNL  . Cesarean section with bilateral tubal ligation N/A 12/02/2012    Procedure: Repeat Cesarean Section Delivery Baby Girl @ 1826, Apgars 8/9, Bilateral Tubal Ligation;  Surgeon: Leslie Andrea, MD;  Location: [facility];  Service: Obstetrics;  Laterality: N/A;  . Abdominal hysterectomy  7/15    for endometriosis  . Tubal ligation    . Tonsillectomy      Family History  Problem Relation Age of Onset  . Hypertension Father   . Coronary artery disease Paternal Uncle   . Cancer Paternal Grandfather   . Diabetes Neg Hx     Social History:  reports that she has never smoked. She has never used smokeless  tobacco. She reports that she drinks alcohol. She reports that she does not use illicit drugs.  Allergies:  Allergies  Allergen Reactions  . Amoxil [Amoxicillin] Rash  . Azithromycin Rash  . Ceftin [Cefuroxime Axetil] Rash  . Other Itching    CHG-wipes she had to use prier to surgery.  Ok to use CHG wash without problems  . Penicillins Rash  . Sulfamethoxazole-Trimethoprim Swelling and Rash    Prescriptions prior to admission  Medication Sig Dispense Refill Last Dose  . bisoprolol-hydrochlorothiazide (ZIAC) 10-6.25 MG per tablet Take 2 tablets by mouth daily.    06/04/2014 at 0500  . clonazePAM (KLONOPIN) 0.5 MG tablet Take 0.5 mg by mouth 2 (two) times daily.   06/04/2014 at 0500  . oxyCODONE-acetaminophen (ROXICET) 5-325 MG per tablet Take 1 tablet by mouth every 6 (six) hours as needed for severe pain. 10 tablet 0 Past Week at Unknown time    Review of Systems  Constitutional: Negative for fever.    Blood pressure 140/95, pulse 78, temperature 97.9 F (36.6 C), temperature source Oral, resp. rate 20, SpO2 100 %, not currently breastfeeding. Physical Exam  Cardiovascular: Normal rate and regular rhythm.   Respiratory: Effort normal and breath sounds normal.  GI: Soft.  Mild RLQ tenderness without rebound or masses    No results found for this or any previous visit (  from the past 24 hour(s)).  No results found.  Assessment/Plan: 34 yo with RLQ pain and history of endometriosis D/W patient L/S with possible RSO Risks reviewed including infection, organ damage, bleeding/transfusiion-HIV/Hep, DVT/PE, pneumonia, continued or recurrent pain, painful intercourse, laparotomy, return to OR All questions answered, patient states she understands and agrees  Steel Kerney II,Caliana Spires E 06/04/2014, 7:29 AM

## 2014-06-04 NOTE — Anesthesia Procedure Notes (Signed)
Procedure Name: Intubation Date/Time: 06/04/2014 7:47 AM Performed by: Elbert EwingsHYMER, Quante Pettry S Pre-anesthesia Checklist: Patient identified, Emergency Drugs available, Suction available, Patient being monitored and Timeout performed Patient Re-evaluated:Patient Re-evaluated prior to inductionOxygen Delivery Method: Circle system utilized Preoxygenation: Pre-oxygenation with 100% oxygen Intubation Type: IV induction Ventilation: Mask ventilation without difficulty Laryngoscope Size: Mac and 3 Grade View: Grade I Tube type: Oral Tube size: 7.0 mm Number of attempts: 1 Placement Confirmation: ETT inserted through vocal cords under direct vision,  positive ETCO2 and breath sounds checked- equal and bilateral Secured at: 20 cm Tube secured with: Tape Dental Injury: Teeth and Oropharynx as per pre-operative assessment

## 2014-06-04 NOTE — Discharge Instructions (Signed)
DISCHARGE INSTRUCTIONS: Laparoscopy  The following instructions have been prepared to help you care for yourself upon your return home today.  MAY TAKE IBUPROFEN (MOTRIN, ADVIL) OR ALEVE AFTER 2:00 PM FOR CRAMPS!! MAY TAKE PATCH BEHIND EAR OFF IN 48 HOURS!!  Wound care:  Do not get the incision wet for the first 24 hours. The incision should be kept clean and dry.  The Band-Aids or dressings may be removed the day after surgery.  Should the incision become sore, red, and swollen after the first week, check with your doctor.  Personal hygiene:  Shower the day after your procedure.  Activity and limitations:  Do NOT drive or operate any equipment today.  Do NOT lift anything more than 15 pounds for 2-3 weeks after surgery.  Do NOT rest in bed all day.  Walking is encouraged. Walk each day, starting slowly with 5-minute walks 3 or 4 times a day. Slowly increase the length of your walks.  Walk up and down stairs slowly.  Do NOT do strenuous activities, such as golfing, playing tennis, bowling, running, biking, weight lifting, gardening, mowing, or vacuuming for 2-4 weeks. Ask your doctor when it is okay to start.  Diet: Eat a light meal as desired this evening. You may resume your usual diet tomorrow.  Return to work: This is dependent on the type of work you do. For the most part you can return to a desk job within a week of surgery. If you are more active at work, please discuss this with your doctor.  What to expect after your surgery: You may have a slight burning sensation when you urinate on the first day. You may have a very small amount of blood in the urine. Expect to have a small amount of vaginal discharge/light bleeding for 1-2 weeks. It is not unusual to have abdominal soreness and bruising for up to 2 weeks. You may be tired and need more rest for about 1 week. You may experience shoulder pain for 24-72 hours. Lying flat in bed may relieve it.  Call your doctor for  any of the following:  Develop a fever of 100.4 or greater  Inability to urinate 6 hours after discharge from hospital  Severe pain not relieved by pain medications  Persistent of heavy bleeding at incision site  Redness or swelling around incision site after a week  Increasing nausea or vomiting  Patient Signature________________________________________ Nurse Signature_________________________________________

## 2014-06-04 NOTE — Transfer of Care (Signed)
Immediate Anesthesia Transfer of Care Note  Patient: Terri MaladyJennifer L Fogleman  Procedure(s) Performed: Procedure(s): LAPAROSCOPY OPERATIVE (N/A) LAPAROSCOPIC LYSIS OF ADHESIONS (N/A) ABLATION ON ENDOMETRIOSIS (N/A)  Patient Location: PACU  Anesthesia Type:General  Level of Consciousness: awake, alert  and oriented  Airway & Oxygen Therapy: Patient Spontanous Breathing and Patient connected to nasal cannula oxygen  Post-op Assessment: Report given to RN and Post -op Vital signs reviewed and stable  Post vital signs: Reviewed and stable  Last Vitals:  Filed Vitals:   06/04/14 0627  BP: 140/95  Pulse:   Temp:   Resp:     Complications: No apparent anesthesia complications

## 2014-06-04 NOTE — Op Note (Signed)
[name]:  Munoz, Terri            ACCOUNT NO.:  [id number]  MEDICAL RECORD NO.:  [id number]  LOCATION:  [facility]                          FACILITY:  [facility]  PHYSICIAN:  Guy SandiferJames E. Henderson Cloudomblin, M.D. DATE OF BIRTH:  26-Mar-1981  DATE OF PROCEDURE:  06/04/2014 DATE OF DISCHARGE:                              OPERATIVE REPORT   PREOPERATIVE DIAGNOSIS:  Pelvic pain.  POSTOPERATIVE DIAGNOSIS:  Endometriosis and pelvic adhesions.  PROCEDURE:  Laparoscopy with extensive lysis of adhesions and ablation of endometriosis.  SURGEON:  Guy SandiferJames E. Henderson Cloudomblin, MD  ANESTHESIA:  General with endotracheal intubation.  ESTIMATED BLOOD LOSS:  Drops.  SPECIMENS:  None.  INDICATIONS AND CONSENT:  This patient is a 34 year old patient status post LAVH, bilateral salpingectomy with known endometriosis.  She has had recurrent pelvic pain especially on the right.  An approximately 4.5 cm cyst was noted in the last month on the right adnexa.  It subsequently resolved but the pain persisted.  The patient requests surgical evaluation.  Laparoscopy with possible removal of the right ovary was discussed preoperatively.  Potential risks and complications were reviewed preoperatively including, but not limited to infection, organ damage, bleeding requiring transfusion of blood products with HIV and hepatitis acquisition, DVT, PE, pneumonia, laparotomy, return to the operating room, pelvic pain, abdominal pain, painful intercourse, recurrent pain in the future.  All questions have been answered and consent was signed on the chart.  The patient states she understands and agrees.  FINDINGS:  Upper abdomen is grossly normal.  In the pelvis, the uterus is gone as well as the fallopian tubes.  The right ovary is normal. There is a row of mostly filmy adhesions of the epiploicae of the bowel to the vaginal cuff and the pelvis bilaterally.  Over the left ovary, there is a single dense adhesion of the epiploicae with a 1 cm  dark brown implant of endometriosis.  The left ovary has multiple filmy adhesions to the pelvic sidewall.  Cul-de-sac was otherwise clean and anterior cul-de-sac was clean.  DESCRIPTION OF PROCEDURE:  The patient was taken to the operating room, where she was identified, placed in a dorsal supine position, general anesthesia was induced via endotracheal intubation.  She was then placed in a dorsal lithotomy position.  Time-out was undertaken.  She was prepped abdominally and vaginally.  Bladder straight catheterized. Sponge on a ring clamp was placed in the vagina and she was draped in a sterile fashion.  The infraumbilical and suprapubic areas were injected in the midline with 0.5% plain Marcaine approximately 9 mL total.  Small infraumbilical incision was made.  Disposable Veress needle was placed on the first attempt without difficulty.  A 2 L of gas were then insufflated under low pressure with good tympany in the right upper quadrant.  Veress needle was removed and a 10/11 Xcel bladeless disposable trocar sleeve was then placed using direct visualization, and the diagnostic scope.  After placement, the operative scope was used.  A small suprapubic incision was made in the midline and a 5-mm disposable trocar sleeve was placed under direct visualization without difficulty. The above findings were noted.  Then, using a disposable pair of Metzenbaum scissors with unipolar cautery, the adhesions  were taken down restoring normal anatomy.  Then, using bipolar cautery, the implant of endometriosis on the left pelvic sidewall as well as small bleeders were controlled.  Irrigation was carried out.  Inspection on the reduced pneumoperitoneum revealed continued hemostasis.  The remaining approximately 20 mL of 0.5% plain Marcaine was instilled into the pelvic cavity.  Suprapubic trocar sleeve was removed.  Pneumoperitoneum was reduced.  The umbilical trocar sleeve was removed.  The  umbilical incision was closed with a 0 Vicryl in the deep subcutaneous layers under good visualization.  Skin was closed with interrupted 2-0 Vicryl on both incisions, and Dermabond was applied on both as well.  All counts were correct.  The patient was awakened and taken to recovery room in stable condition.     Guy Sandifer Henderson Cloud, M.D.     JET/MEDQ  D:  06/04/2014  T:  06/04/2014  Job:  [id number]

## 2014-06-04 NOTE — Brief Op Note (Signed)
06/04/2014  8:42 AM  PATIENT:  [name]  34 y.o. female  PRE-OPERATIVE DIAGNOSIS:  endometriosis  POST-OPERATIVE DIAGNOSIS:  endometriosis, adhesions   PROCEDURE:  Procedure(s): LAPAROSCOPY OPERATIVE (N/A) LAPAROSCOPIC LYSIS OF ADHESIONS (N/A) ABLATION ON ENDOMETRIOSIS (N/A)  SURGEON:  Surgeon(s) and Role:    * Leslie AndreaJames E Manvir Thorson II, MD - Primary  PHYSICIAN ASSISTANT:   ASSISTANTS: none   ANESTHESIA:   general  EBL:  Total I/O In: 1000 [I.V.:1000] Out: 102 [Urine:100; Blood:2]  BLOOD ADMINISTERED:none  DRAINS: none   LOCAL MEDICATIONS USED:  MARCAINE   , 30 ml  SPECIMEN:  No Specimen  DISPOSITION OF SPECIMEN:  N/A  COUNTS:  YES  TOURNIQUET:  * No tourniquets in log *  DICTATION: .Other Dictation: Dictation Number [id number]  PLAN OF CARE: Discharge to home after PACU  PATIENT DISPOSITION:  PACU - hemodynamically stable.   Delay start of Pharmacological VTE agent (>24hrs) due to surgical blood loss or risk of bleeding: not applicable

## 2014-06-04 NOTE — Anesthesia Postprocedure Evaluation (Signed)
  Anesthesia Post-op Note  Patient: [name]  Procedure(s) Performed: Procedure(s) (LRB): LAPAROSCOPY OPERATIVE (N/A) LAPAROSCOPIC LYSIS OF ADHESIONS (N/A) ABLATION ON ENDOMETRIOSIS (N/A)  Patient Location: PACU  Anesthesia Type: General  Level of Consciousness: awake and alert   Airway and Oxygen Therapy: Patient Spontanous Breathing  Post-op Pain: mild  Post-op Assessment: Post-op Vital signs reviewed, Patient's Cardiovascular Status Stable, Respiratory Function Stable, Patent Airway and No signs of Nausea or vomiting  Last Vitals:  Filed Vitals:   06/04/14 0627  BP: 140/95  Pulse:   Temp:   Resp:     Post-op Vital Signs: stable   Complications: No apparent anesthesia complications

## 2014-06-05 ENCOUNTER — Encounter (HOSPITAL_COMMUNITY): Payer: Self-pay | Admitting: Obstetrics and Gynecology

## 2014-07-03 ENCOUNTER — Encounter: Payer: Self-pay | Admitting: Internal Medicine

## 2014-07-03 MED ORDER — HYDROCODONE-HOMATROPINE 5-1.5 MG/5ML PO SYRP: 5.0000 mL | ORAL_SOLUTION | Freq: Every evening | ORAL | Status: AC | PRN

## 2014-07-03 NOTE — Telephone Encounter (Signed)
Please let her know that I will refill a small amount to get her through this illness

## 2014-07-14 ENCOUNTER — Ambulatory Visit: Payer: BLUE CROSS/BLUE SHIELD | Admitting: Primary Care

## 2014-07-16 ENCOUNTER — Telehealth: Payer: Self-pay | Admitting: Internal Medicine

## 2014-07-16 NOTE — Telephone Encounter (Signed)
I don't believe it is necessary to follow up at this point. Her symptoms may have improved with the additional medication that was provided to her on the 18th.   Thank you!

## 2014-07-16 NOTE — Telephone Encounter (Signed)
Patient did not come for their scheduled appointment 3/29 for sinus.  Please let me know if the patient needs to be contacted immediately for follow up or if no follow up is necessary.

## 2014-07-20 ENCOUNTER — Emergency Department: Admit: 2014-07-20 | Disposition: A | Discharge status: Home / Self Care | Payer: Self-pay | Admitting: Student

## 2014-07-20 LAB — COMPREHENSIVE METABOLIC PANEL
Albumin: 5 g/dL
Alkaline Phosphatase: 34 U/L — ABNORMAL LOW
Anion Gap: 9 (ref 7–16)
BUN: 12 mg/dL
Bilirubin,Total: 1 mg/dL
Calcium, Total: 9.4 mg/dL
Chloride: 105 mmol/L
Co2: 24 mmol/L
Creatinine: 0.74 mg/dL
EGFR (African American): >60
EGFR (Non-African Amer.): >60
Glucose: 87 mg/dL
Potassium: 4 mmol/L
SGOT(AST): 32 U/L
SGPT (ALT): 27 U/L
Sodium: 138 mmol/L
Total Protein: 8.7 g/dL — ABNORMAL HIGH

## 2014-07-20 LAB — URINALYSIS, COMPLETE
Bilirubin,UR: NEGATIVE
Blood: NEGATIVE
Glucose,UR: NEGATIVE mg/dL (ref 0–75)
Leukocyte Esterase: NEGATIVE
Nitrite: NEGATIVE
Ph: 5 (ref 4.5–8.0)
Protein: NEGATIVE
RBC,UR: 1 /HPF (ref 0–5)
Specific Gravity: 1.02 (ref 1.003–1.030)
Squamous Epithelial: 7
WBC UR: <1 /HPF (ref 0–5)

## 2014-07-20 LAB — CBC
HCT: 38.4 % (ref 35.0–47.0)
HGB: 12.9 g/dL (ref 12.0–16.0)
MCH: 30.6 pg (ref 26.0–34.0)
MCHC: 33.7 g/dL (ref 32.0–36.0)
MCV: 91 fL (ref 80–100)
Platelet: 318 10*3/uL (ref 150–440)
RBC: 4.22 10*6/uL (ref 3.80–5.20)
RDW: 13.2 % (ref 11.5–14.5)
WBC: 5.9 10*3/uL (ref 3.6–11.0)

## 2014-07-20 LAB — TROPONIN I: Troponin-I: <0.03 ng/mL

## 2014-07-21 ENCOUNTER — Encounter (HOSPITAL_COMMUNITY): Payer: Self-pay | Admitting: *Deleted

## 2014-07-21 ENCOUNTER — Inpatient Hospital Stay (HOSPITAL_COMMUNITY)
Admission: AD | Admit: 2014-07-21 | Discharge: 2014-07-21 | Disposition: A | Discharge status: Home / Self Care | Payer: BLUE CROSS/BLUE SHIELD | Source: Ambulatory Visit | Attending: Obstetrics and Gynecology | Admitting: Obstetrics and Gynecology

## 2014-07-21 ENCOUNTER — Telehealth: Payer: Self-pay | Admitting: Internal Medicine

## 2014-07-21 DIAGNOSIS — R519 Headache, unspecified: Secondary | ICD-10-CM

## 2014-07-21 DIAGNOSIS — R51 Headache: Secondary | ICD-10-CM | POA: Diagnosis not present

## 2014-07-21 DIAGNOSIS — I10 Essential (primary) hypertension: Secondary | ICD-10-CM | POA: Diagnosis not present

## 2014-07-21 LAB — URINALYSIS, ROUTINE W REFLEX MICROSCOPIC
Bilirubin Urine: NEGATIVE
Glucose, UA: NEGATIVE mg/dL
Hgb urine dipstick: NEGATIVE
Ketones, ur: NEGATIVE mg/dL
Leukocytes, UA: NEGATIVE
Nitrite: NEGATIVE
Protein, ur: NEGATIVE mg/dL
Specific Gravity, Urine: 1.01 (ref 1.005–1.030)
Urobilinogen, UA: 0.2 mg/dL (ref 0.0–1.0)
pH: 6 (ref 5.0–8.0)

## 2014-07-21 MED ORDER — OXYCODONE-ACETAMINOPHEN 5-325 MG PO TABS: 2.0000 | ORAL_TABLET | ORAL | Status: DC | PRN

## 2014-07-21 MED ORDER — OXYCODONE-ACETAMINOPHEN 5-325 MG PO TABS
1.0000 | ORAL_TABLET | Freq: Four times a day (QID) | ORAL | Status: DC | PRN
  Administered 2014-07-21 (×2): 1 via ORAL
  Filled 2014-07-21 (×2): qty 1

## 2014-07-21 NOTE — Telephone Encounter (Signed)
Patient didn't return my call.

## 2014-07-21 NOTE — Discharge Instructions (Signed)
Hypertension °Hypertension, commonly called high blood pressure, is when the force of blood pumping through your arteries is too strong. Your arteries are the blood vessels that carry blood from your heart throughout your body. A blood pressure reading consists of a higher number over a lower number, such as 110/72. The higher number (systolic) is the pressure inside your arteries when your heart pumps. The lower number (diastolic) is the pressure inside your arteries when your heart relaxes. Ideally you want your blood pressure below 120/80. °Hypertension forces your heart to work harder to pump blood. Your arteries may become narrow or stiff. Having hypertension puts you at risk for heart disease, stroke, and other problems.  °RISK FACTORS °Some risk factors for high blood pressure are controllable. Others are not.  °Risk factors you cannot control include:  °· Race. You may be at higher risk if you are African American. °· Age. Risk increases with age. °· Gender. Men are at higher risk than women before age 45 years. After age 65, women are at higher risk than men. °Risk factors you can control include: °· Not getting enough exercise or physical activity. °· Being overweight. °· Getting too much fat, sugar, calories, or salt in your diet. °· Drinking too much alcohol. °SIGNS AND SYMPTOMS °Hypertension does not usually cause signs or symptoms. Extremely high blood pressure (hypertensive crisis) may cause headache, anxiety, shortness of breath, and nosebleed. °DIAGNOSIS  °To check if you have hypertension, your health care provider will measure your blood pressure while you are seated, with your arm held at the level of your heart. It should be measured at least twice using the same arm. Certain conditions can cause a difference in blood pressure between your right and left arms. A blood pressure reading that is higher than normal on one occasion does not mean that you need treatment. If one blood pressure reading  is high, ask your health care provider about having it checked again. °TREATMENT  °Treating high blood pressure includes making lifestyle changes and possibly taking medicine. Living a healthy lifestyle can help lower high blood pressure. You may need to change some of your habits. °Lifestyle changes may include: °· Following the DASH diet. This diet is high in fruits, vegetables, and whole grains. It is low in salt, red meat, and added sugars. °· Getting at least 2½ hours of brisk physical activity every week. °· Losing weight if necessary. °· Not smoking. °· Limiting alcoholic beverages. °· Learning ways to reduce stress. ° If lifestyle changes are not enough to get your blood pressure under control, your health care provider may prescribe medicine. You may need to take more than one. Work closely with your health care provider to understand the risks and benefits. °HOME CARE INSTRUCTIONS °· Have your blood pressure rechecked as directed by your health care provider.   °· Take medicines only as directed by your health care provider. Follow the directions carefully. Blood pressure medicines must be taken as prescribed. The medicine does not work as well when you skip doses. Skipping doses also puts you at risk for problems.   °· Do not smoke.   °· Monitor your blood pressure at home as directed by your health care provider.  °SEEK MEDICAL CARE IF:  °· You think you are having a reaction to medicines taken. °· You have recurrent headaches or feel dizzy. °· You have swelling in your ankles. °· You have trouble with your vision. °SEEK IMMEDIATE MEDICAL CARE IF: °· You develop a severe headache or confusion. °·   You have unusual weakness, numbness, or feel faint. °· You have severe chest or abdominal pain. °· You vomit repeatedly. °· You have trouble breathing. °MAKE SURE YOU:  °· Understand these instructions. °· Will watch your condition. °· Will get help right away if you are not doing well or get worse. °Document  Released: 04/03/2005 Document Revised: 08/18/2013 Document Reviewed: 01/24/2013 °[employer]® Patient Information ©2015 [employer]. This information is not intended to replace advice given to you by your health care provider. Make sure you discuss any questions you have with your health care provider. °General Headache Without Cause °A headache is pain or discomfort felt around the head or neck area. The specific cause of a headache may not be found. There are many causes and types of headaches. A few common ones are: °· Tension headaches. °· Migraine headaches. °· Cluster headaches. °· Chronic daily headaches. °HOME CARE INSTRUCTIONS  °· Keep all follow-up appointments with your caregiver or any specialist referral. °· Only take over-the-counter or prescription medicines for pain or discomfort as directed by your caregiver. °· Lie down in a dark, quiet room when you have a headache. °· Keep a headache journal to find out what may trigger your migraine headaches. For example, write down: °· What you eat and drink. °· How much sleep you get. °· Any change to your diet or medicines. °· Try massage or other relaxation techniques. °· Put ice packs or heat on the head and neck. Use these 3 to 4 times per day for 15 to 20 minutes each time, or as needed. °· Limit stress. °· Sit up straight, and do not tense your muscles. °· Quit smoking if you smoke. °· Limit alcohol use. °· Decrease the amount of caffeine you drink, or stop drinking caffeine. °· Eat and sleep on a regular schedule. °· Get 7 to 9 hours of sleep, or as recommended by your caregiver. °· Keep lights dim if bright lights bother you and make your headaches worse. °SEEK MEDICAL CARE IF:  °· You have problems with the medicines you were prescribed. °· Your medicines are not working. °· You have a change from the usual headache. °· You have nausea or vomiting. °SEEK IMMEDIATE MEDICAL CARE IF:  °· Your headache becomes severe. °· You have a fever. °· You have a  stiff neck. °· You have loss of vision. °· You have muscular weakness or loss of muscle control. °· You start losing your balance or have trouble walking. °· You feel faint or pass out. °· You have severe symptoms that are different from your first symptoms. °MAKE SURE YOU:  °· Understand these instructions. °· Will watch your condition. °· Will get help right away if you are not doing well or get worse. °Document Released: 04/03/2005 Document Revised: 06/26/2011 Document Reviewed: 04/19/2011 °[employer]® Patient Information ©2015 [employer]. This information is not intended to replace advice given to you by your health care provider. Make sure you discuss any questions you have with your health care provider. ° °

## 2014-07-21 NOTE — Telephone Encounter (Signed)
Patient did have a headache.  I tried to call her on her cell phone, but no answer.  I left a message for her to call me back and let me know how she's feeling.

## 2014-07-21 NOTE — MAU Provider Note (Signed)
CC: Hypertension and Headache    First Provider Initiated Contact with Patient 07/21/14 1904      HPI [name] is a 34 y.o. [id number] who presents with onset 1 week ago of severe intermittent bitemporal throbbing headaches. The headache waxes and wanes, at times rates 9/10 pain. It is not associated with visual symptoms. No focal neurologic symptoms.  She began taking home blood pressures last week due to the headaches and had elevations in the 160s to 170s. She is been in contact with her gynecologist and her PCP. She was advised to go to the ED and went to our [facility] ER last night. She states she was treated with Benadryl/Reglan/Decadron but got no relief. She had a head CT scan which was normal and labs that were normalShe tried ibuprofen today without relief. She's had similar headaches in the past and associates them with her blood pressure being elevated. Headaches have been responsive to Vicodin or Percocet in the past. She started norethindrone and Depo-Lupron 06/25/2014 for endometriosis and is concerned that her headache and blood pressure elevations are due to the medication. She is taking her antihypertensive as directed. She has an appointment tomorrow morning with her PCP to adjust her antihypertensive meds. Also reports relationship stress.    Past Medical History  Diagnosis Date  . Hyperlipidemia     diet controlled, no meds  . Hypertension   . Pregnancy induced hypertension 2008  . Abnormal Pap smear   . Endometriosis   . Anxiety   . Anxiety   . PONV (postoperative nausea and vomiting)   . HA (headache)     otc med prn    OB History  Gravida Para Term Preterm AB SAB TAB Ectopic Multiple Living  2 2 1 1      2     # Outcome Date GA Lbr Len/2nd Weight Sex Delivery Anes PTL Lv  2 Preterm 12/02/12 5231w5d  6 lb 6.8 oz (2.915 kg) F CS-LTranv Spinal  Y  1 Term 2008 1671w0d  6 lb 14 oz (3.118 kg) F CS-LTranv Spinal N Y     Comments:  pre-e on MAG, IOL. failure to progress       Past Surgical History  Procedure Laterality Date  . Tonsillectomy    . Tympanostomy tube placement    . Cesarean section  2008  . Laparoscopy  12/01/2011    Procedure: LAPAROSCOPY OPERATIVE;  Surgeon: Leslie AndreaJames E Tomblin II, MD;  Location: [facility];  Service: Gynecology;  Laterality: N/A;  . Esophageal biopsy  12/01/2011    Procedure: BIOPSY;  Surgeon: Leslie AndreaJames E Tomblin II, MD;  Location: [facility];  Service: Gynecology;  Laterality: N/A;  . Endometrial biopsy    . Colposcopy      as teenager, results WNL  . Cesarean section with bilateral tubal ligation N/A 12/02/2012    Procedure: Repeat Cesarean Section Delivery Baby Girl @ 1826, Apgars 8/9, Bilateral Tubal Ligation;  Surgeon: Leslie AndreaJames E Tomblin II, MD;  Location: [facility];  Service: Obstetrics;  Laterality: N/A;  . Abdominal hysterectomy  7/15    for endometriosis  . Tubal ligation    . Tonsillectomy    . Laparoscopy N/A 06/04/2014    Procedure: LAPAROSCOPY OPERATIVE;  Surgeon: Leslie AndreaJames E Tomblin II, MD;  Location: [facility];  Service: Gynecology;  Laterality: N/A;  . Laparoscopic lysis of adhesions N/A 06/04/2014    Procedure: LAPAROSCOPIC LYSIS OF ADHESIONS;  Surgeon: Leslie AndreaJames E Tomblin II, MD;  Location: [facility];  Service:  Gynecology;  Laterality: N/A;  . Ablation on endometriosis N/A 06/04/2014    Procedure: ABLATION ON ENDOMETRIOSIS;  Surgeon: Leslie Andrea, MD;  Location: [facility];  Service: Gynecology;  Laterality: N/A;    History   Social History  . Marital Status: Single    Spouse Name: N/A  . Number of Children: 1  . Years of Education: N/A   Occupational History  . 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 Smoking status: Never Smoker   . Smokeless tobacco: Never Used  . Alcohol Use: Yes     Comment: occasional  . Drug Use: No  . Sexual Activity: Not Currently    Birth Control/ Protection: Surgical   Other Topics Concern  . Not  on file   Social History Narrative    No current facility-administered medications on file prior to encounter.   Current Outpatient Prescriptions on File Prior to Encounter  Medication Sig Dispense Refill  . bisoprolol-hydrochlorothiazide (ZIAC) 10-6.25 MG per tablet Take 2 tablets by mouth daily.     . clonazePAM (KLONOPIN) 0.5 MG tablet Take 0.5 mg by mouth 2 (two) times daily.    Marland Kitchen ibuprofen (ADVIL) 200 MG tablet Take 3 tablets (600 mg total) by mouth every 6 (six) hours as needed. 30 tablet 0  . oxyCODONE-acetaminophen (ROXICET) 5-325 MG per tablet Take 1 tablet by mouth every 6 (six) hours as needed for severe pain. 10 tablet 0    Allergies  Allergen Reactions  . Amoxil [Amoxicillin] Rash  . Azithromycin Rash  . Ceftin [Cefuroxime Axetil] Rash  . Other Itching    CHG-wipes she had to use prier to surgery.  Ok to use CHG wash without problems  . Penicillins Rash  . Sulfamethoxazole-Trimethoprim Swelling and Rash     Review of Systems  Constitutional: Positive for fever. Negative for chills.  HENT: Negative for congestion, ear pain and hearing loss.   Eyes: Negative for blurred vision, double vision, photophobia and pain.  Cardiovascular: Negative for chest pain and palpitations.  Gastrointestinal: Negative for nausea, vomiting and abdominal pain.  Neurological: Positive for headaches. Negative for dizziness, tingling, sensory change and focal weakness.    PHYSICAL EXAM Filed Vitals:   07/21/14 1859  BP: 157/97  Pulse: 73  Temp:   Resp:     BP Range. 161-096/04-540 Filed Vitals:   07/21/14 1951 07/21/14 2001 07/21/14 2011 07/21/14 2021  BP: 179/153 157/96 164/102 160/99  Pulse: 81 82 77 69  Temp:      TempSrc:      Resp:  18      General: Well nourished, well developed female in no acute distress Cardiovascular: Normal rate Respiratory: Normal effort Abdomen: Soft, nontender Back: No CVAT Extremities: No edema Neurologic:grossly intact    LAB  RESULTS Results for orders placed or performed during the hospital encounter of 07/21/14 (from the past 24 hour(s))  Urinalysis, Routine w reflex microscopic     Status: None   Collection Time: 07/21/14  4:45 PM  Result Value Ref Range   Color, Urine YELLOW YELLOW   APPearance CLEAR CLEAR   Specific Gravity, Urine 1.010 1.005 - 1.030   pH 6.0 5.0 - 8.0   Glucose, UA NEGATIVE NEGATIVE mg/dL   Hgb urine dipstick NEGATIVE NEGATIVE   Bilirubin Urine NEGATIVE NEGATIVE   Ketones, ur NEGATIVE NEGATIVE mg/dL   Protein,  ur NEGATIVE NEGATIVE mg/dL   Urobilinogen, UA 0.2 0.0 - 1.0 mg/dL   Nitrite NEGATIVE NEGATIVE   Leukocytes, UA NEGATIVE NEGATIVE    IMAGING No results found.  MAU COURSE  Percocet 5/325mg  1 tabs po 2015:  H/A improved to 5/10 on pain scale 2030: C/W Dr. Rana Snare who recommends transfer to medical ED for  2045: percocet 5/325mg  po given. Pt declines transfer to medical ED and will keep appointment tomorrow am with PMD. AMA form done and risks, danger signs reviewed  ASSESSMENT  1. Acute intractable headache, unspecified headache type   H/A may be exacerbated by new endometriosis meds CHTN with exacerbation  PLAN Discharged AMA     Medication List    STOP taking these medications        clonazePAM 0.5 MG tablet  Commonly known as:  KLONOPIN     zolpidem 5 MG tablet  Commonly known as:  AMBIEN      TAKE these medications        bisoprolol-hydrochlorothiazide 10-6.25 MG per tablet  Commonly known as:  ZIAC  Take 2 tablets by mouth daily.     ibuprofen 200 MG tablet  Commonly known as:  ADVIL  Take 3 tablets (600 mg total) by mouth every 6 (six) hours as needed.     leuprolide 3.75 MG injection  Commonly known as:  LUPRON  Inject 3.75 mg into the muscle every 28 (twenty-eight) days.     norethindrone 5 MG tablet  Commonly known as:  AYGESTIN  Take 5 mg by mouth at bedtime.     oxyCODONE-acetaminophen 5-325 MG per tablet  Commonly known as:   PERCOCET/ROXICET  Take 2 tablets by mouth every 4 (four) hours as needed for severe pain.       Follow-up Information    Follow up with Tillman Abide, MD In 1 day.   Specialties:  Internal Medicine, Pediatrics   Why:  return to ED if H/A worsens or associated with visual or neurolgic symptoms   Contact information:   [address] [place] [address] [phone]         Danae Orleans, CNM 07/21/2014 7:10 PM

## 2014-07-21 NOTE — Telephone Encounter (Signed)
If she is not having chest pain or headache, I guess it is If okay to wait for the appt tomorrow

## 2014-07-21 NOTE — Telephone Encounter (Signed)
Patient was put on Depo luveron  by her gynecologist. She had the injection on 06/25/14.  Patient's blood pressure has been going up over the last week.   Patient blood pressure was 160/114 yesterday.  Patient went to [facility] ER last night.  Patient said her blood pressure today is 158/114.   [facility] told her to follow up with her PCP today.  I offered an appointment with Dr.Duncan this afternoon, but she wants to wait and see Dr. Alphonsus SiasLetvak tomorrow.  Patient called Dr.Tomlin's office and they said for her to follow up with her PCP.

## 2014-07-21 NOTE — MAU Note (Signed)
Hx of HTN, has been elevated over the last week, having bad HA's.  Started getting depro-lupron for endometriosis on March 10, thinks BP & HA may be reaction to the med.  Taking HCTZ for HTN.

## 2014-07-22 ENCOUNTER — Ambulatory Visit (INDEPENDENT_AMBULATORY_CARE_PROVIDER_SITE_OTHER): Payer: BLUE CROSS/BLUE SHIELD | Admitting: Internal Medicine

## 2014-07-22 ENCOUNTER — Encounter: Payer: Self-pay | Admitting: Internal Medicine

## 2014-07-22 ENCOUNTER — Telehealth: Payer: Self-pay

## 2014-07-22 VITALS — BP 129/93 | HR 60 | Temp 98.8°F | Ht 65.0 in | Wt 220.2 lb

## 2014-07-22 DIAGNOSIS — I10 Essential (primary) hypertension: Secondary | ICD-10-CM | POA: Diagnosis not present

## 2014-07-22 DIAGNOSIS — R519 Headache, unspecified: Secondary | ICD-10-CM

## 2014-07-22 DIAGNOSIS — R51 Headache: Secondary | ICD-10-CM

## 2014-07-22 MED ORDER — LOSARTAN POTASSIUM 100 MG PO TABS: 100.0000 mg | ORAL_TABLET | Freq: Every day | ORAL | Status: DC

## 2014-07-22 NOTE — Assessment & Plan Note (Signed)
Hard to tell what is causing this Could be from the new meds---but they are helping the endometriosis I suspect this is not from her BP--but she has had borderline control May need trial off the endometriosis regimen if lower BP doesn't clear up the headache

## 2014-07-22 NOTE — Telephone Encounter (Signed)
Pt said was seen earlier today and bisoprolol-HCTZ was decreased to take one tab daily and losartan added; pt is to return 08/21/14; pt has plenty of bisoprolol HCTZ but wanted to know if needed to contact [facility] [address] and change instructions; advised pt to take med as prescribed by Dr Alphonsus SiasLetvak and when comes in for 08/21/14 visit ask about bisoprolol HCTZ refill incase instructions were to be adjusted at that time. Pt voiced understanding.

## 2014-07-22 NOTE — Progress Notes (Signed)
Subjective:    Patient ID: [name], female    DOB: [birth year], 34 y.o.   MRN: [id number]  HPI [facility] and CNM report from hospital Dr Henderson Cloudomblin has been managing the endometriosis Was having severe headaches  BP running 160/114 at home Highest 176/122 ER yesterday ~160/110  Headache persists now (though BP down)  No chest pain No SOB Mild dizzy feeling upon standing--relates to the throbbing headache  Having marital stress Plans to start counseling through work  Current Outpatient Prescriptions on File Prior to Visit  Medication Sig Dispense Refill  . bisoprolol-hydrochlorothiazide (ZIAC) 10-6.25 MG per tablet Take 2 tablets by mouth daily.     Marland Kitchen. ibuprofen (ADVIL) 200 MG tablet Take 3 tablets (600 mg total) by mouth every 6 (six) hours as needed. 30 tablet 0  . leuprolide (LUPRON) 3.75 MG injection Inject 3.75 mg into the muscle every 28 (twenty-eight) days.    . norethindrone (AYGESTIN) 5 MG tablet Take 5 mg by mouth at bedtime.    Marland Kitchen. oxyCODONE-acetaminophen (PERCOCET/ROXICET) 5-325 MG per tablet Take 2 tablets by mouth every 4 (four) hours as needed for severe pain. 6 tablet 0   No current facility-administered medications on file prior to visit.    Allergies  Allergen Reactions  . Amoxil [Amoxicillin] Rash  . Azithromycin Rash  . Ceftin [Cefuroxime Axetil] Rash  . Other Itching    CHG-wipes she had to use prier to surgery.  Ok to use CHG wash without problems  . Penicillins Rash  . Sulfamethoxazole-Trimethoprim Swelling and Rash    Past Medical History  Diagnosis Date  . Hyperlipidemia     diet controlled, no meds  . Hypertension   . Pregnancy induced hypertension 2008  . Abnormal Pap smear   . Endometriosis   . Anxiety   . Anxiety   . PONV (postoperative nausea and vomiting)   . HA (headache)     otc med prn    Past Surgical History  Procedure Laterality Date  . Tonsillectomy    . Tympanostomy tube placement    . Cesarean  section  2008  . Laparoscopy  12/01/2011    Procedure: LAPAROSCOPY OPERATIVE;  Surgeon: Leslie AndreaJames E Tomblin II, MD;  Location: [facility];  Service: Gynecology;  Laterality: N/A;  . Esophageal biopsy  12/01/2011    Procedure: BIOPSY;  Surgeon: Leslie AndreaJames E Tomblin II, MD;  Location: [facility];  Service: Gynecology;  Laterality: N/A;  . Endometrial biopsy    . Colposcopy      as teenager, results WNL  . Cesarean section with bilateral tubal ligation N/A 12/02/2012    Procedure: Repeat Cesarean Section Delivery Baby Girl @ 1826, Apgars 8/9, Bilateral Tubal Ligation;  Surgeon: Leslie AndreaJames E Tomblin II, MD;  Location: [facility];  Service: Obstetrics;  Laterality: N/A;  . Abdominal hysterectomy  7/15    for endometriosis  . Tubal ligation    . Tonsillectomy    . Laparoscopy N/A 06/04/2014    Procedure: LAPAROSCOPY OPERATIVE;  Surgeon: Leslie AndreaJames E Tomblin II, MD;  Location: [facility];  Service: Gynecology;  Laterality: N/A;  . Laparoscopic lysis of adhesions N/A 06/04/2014    Procedure: LAPAROSCOPIC LYSIS OF ADHESIONS;  Surgeon: Leslie AndreaJames E Tomblin II, MD;  Location: [facility];  Service: Gynecology;  Laterality: N/A;  . Ablation on endometriosis N/A 06/04/2014    Procedure: ABLATION ON ENDOMETRIOSIS;  Surgeon: Leslie AndreaJames E Tomblin II, MD;  Location: [facility];  Service: Gynecology;  Laterality: N/A;    Family History  Problem Relation Age of Onset  . Hypertension Father   . Coronary artery disease Paternal Uncle   . Cancer Paternal Grandfather   . Diabetes Neg Hx     History   Social History  . Marital Status: Single    Spouse Name: N/A  . Number of Children: 1  . Years of Education: N/A   Occupational History  . 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 Smoking status: Never Smoker   . Smokeless tobacco: Never Used  . Alcohol Use: Yes     Comment: occasional  . Drug Use: No  . Sexual Activity: Not Currently    Birth Control/  Protection: Surgical   Other Topics Concern  . Not on file   Social History Narrative   Review of Systems Appetite is okay Not sleeping well--got zolpidem from Dr Henderson Cloud    Objective:   Physical Exam  Constitutional: She appears well-developed and well-nourished. No distress.  Neck: Neck supple. No thyromegaly present.  Cardiovascular: Normal rate, regular rhythm and normal heart sounds.  Exam reveals no gallop.   No murmur heard. Pulmonary/Chest: Effort normal and breath sounds normal. No respiratory distress. She has no wheezes. She has no rales.  Musculoskeletal: She exhibits no edema.  Lymphadenopathy:    She has no cervical adenopathy.          Assessment & Plan:

## 2014-07-22 NOTE — Patient Instructions (Signed)
Please take only 1 of the bisoprolol daily Start the losartan tonight. Cut the first one in half and take 1/2 for 2 nights. If you have no problems, increase to a full tab after those first 2 days.

## 2014-07-22 NOTE — Telephone Encounter (Signed)
Will see today then

## 2014-07-22 NOTE — Assessment & Plan Note (Signed)
BP Readings from Last 3 Encounters:  07/22/14 129/93  07/21/14 150/102  06/04/14 122/70   140/100 on right today Has been very high at times---those levels are probably secondary to the severe headache Will add losartan to bring BP down

## 2014-07-22 NOTE — Progress Notes (Signed)
Pre visit review using our clinic review tool, if applicable. No additional management support is needed unless otherwise documented below in the visit note. 

## 2014-07-23 ENCOUNTER — Encounter: Payer: Self-pay | Admitting: Internal Medicine

## 2014-08-21 ENCOUNTER — Ambulatory Visit: Payer: BLUE CROSS/BLUE SHIELD | Admitting: Internal Medicine

## 2014-09-01 DIAGNOSIS — N83209 Unspecified ovarian cyst, unspecified side: Secondary | ICD-10-CM | POA: Insufficient documentation

## 2014-09-01 DIAGNOSIS — I10 Essential (primary) hypertension: Secondary | ICD-10-CM | POA: Insufficient documentation

## 2014-09-01 DIAGNOSIS — E66812 Obesity, class 2: Secondary | ICD-10-CM | POA: Insufficient documentation

## 2014-09-01 DIAGNOSIS — F419 Anxiety disorder, unspecified: Secondary | ICD-10-CM | POA: Insufficient documentation

## 2014-09-01 DIAGNOSIS — E669 Obesity, unspecified: Secondary | ICD-10-CM | POA: Insufficient documentation

## 2014-09-22 ENCOUNTER — Telehealth: Payer: Self-pay | Admitting: Obstetrics and Gynecology

## 2014-09-22 NOTE — Telephone Encounter (Signed)
Pt called and she had just switched to see dr Valentino Saxoncherry and her last GYN doctor prescribed her a RX Ambien to help her sleep because of her severe hot flashes..she was wondering if that was something you could prescribed her.. And she wanted to let you know that the depo provera has been helping with her endometriosis pain..[facility] uses [facility] [address] [place].

## 2014-09-29 ENCOUNTER — Other Ambulatory Visit: Payer: Self-pay | Admitting: Obstetrics and Gynecology

## 2014-09-29 DIAGNOSIS — R102 Pelvic and perineal pain: Principal | ICD-10-CM

## 2014-09-29 DIAGNOSIS — G8929 Other chronic pain: Secondary | ICD-10-CM

## 2014-09-29 MED ORDER — ZOLPIDEM TARTRATE 5 MG PO TABS: 5.0000 mg | ORAL_TABLET | Freq: Every evening | ORAL | Status: DC | PRN

## 2014-09-29 NOTE — Telephone Encounter (Signed)
-----   Message from Hildred Laser, MD sent at 09/29/2014 11:18 AM EDT ----- Regarding: prescription and referral Please inform patient that prescription has been called to her pharmacy, and a referral has been placed for her to the pelvic floor physical therapist.

## 2014-09-29 NOTE — Addendum Note (Signed)
Addended by: Fabian November on: 09/29/2014 11:18 AM   Modules accepted: Orders

## 2014-09-29 NOTE — Telephone Encounter (Signed)
Pt informed of information below

## 2014-09-29 NOTE — Progress Notes (Signed)
Patient also inquiring into pelvic physical floor therapy for pain.  Will send referral.

## 2014-09-29 NOTE — Progress Notes (Signed)
Error in message routing, initial phone request for prescription was 09/22/14.  Called in prescription for ambien 5 mg qhs, #30, refills 3. If she is continuing to have hot flashes at night, there are other medications that may be able to relieve her symptoms so that she does not become dependent on the Ambien for sleep, and we can discuss this further at her next visit for her Depo Provera injection.

## 2014-10-05 ENCOUNTER — Telehealth: Payer: Self-pay | Admitting: Obstetrics and Gynecology

## 2014-10-05 NOTE — Telephone Encounter (Signed)
Pain last 2 days, Tramadol not helping. Has tried heating pad and IBP. Pt. To make an appt. To see Dr. Valentino Saxon tomorrow.  Pt. Prefers to do this.

## 2014-10-05 NOTE — Telephone Encounter (Signed)
PT CALLED AND SHE HAS STARTED THE DEPO, SHE HAS A CYST ON THE RIGHT SIDE, AND NOW SHE IS HAVING PAIN ON THE LEFT SIDE, SHE IS UNSURE IF SHE HAS ANOTHER CYST, PAIN GOING DOWN INTO LEFT LEG, MAKING IT HARD FOR HER TO BE MOBILE, DR CHERRY DID GIVE HER TRAMADOL BACK IN MAY AND IT IS NOT HELPING, SHE WOULD LIKE A CALL BACK TO SEE IF SHE NEEDS TO MAKE APPT TO SEE DR CHERRY OR GO TO THE ER, SHE IS NOT WANTING TO GO TO THE ER IF SHE DOES NOT HAVE TO AND SHE IS UNSURE IF THIS IS ANOTHER CYST OR IF IT COULD BE SOMETHING ELSE

## 2014-10-06 ENCOUNTER — Ambulatory Visit
Admission: RE | Admit: 2014-10-06 | Discharge: 2014-10-06 | Disposition: A | Discharge status: Home / Self Care | Payer: BLUE CROSS/BLUE SHIELD | Source: Ambulatory Visit | Attending: Obstetrics and Gynecology | Admitting: Obstetrics and Gynecology

## 2014-10-06 ENCOUNTER — Other Ambulatory Visit: Payer: Self-pay | Admitting: Obstetrics and Gynecology

## 2014-10-06 ENCOUNTER — Encounter: Payer: Self-pay | Admitting: Obstetrics and Gynecology

## 2014-10-06 ENCOUNTER — Ambulatory Visit (INDEPENDENT_AMBULATORY_CARE_PROVIDER_SITE_OTHER): Payer: BLUE CROSS/BLUE SHIELD | Admitting: Obstetrics and Gynecology

## 2014-10-06 ENCOUNTER — Other Ambulatory Visit: Payer: Self-pay

## 2014-10-06 VITALS — BP 131/87 | HR 84 | Ht 65.0 in | Wt 219.6 lb

## 2014-10-06 DIAGNOSIS — M545 Low back pain: Secondary | ICD-10-CM | POA: Diagnosis not present

## 2014-10-06 DIAGNOSIS — M5432 Sciatica, left side: Secondary | ICD-10-CM | POA: Insufficient documentation

## 2014-10-06 DIAGNOSIS — M5442 Lumbago with sciatica, left side: Secondary | ICD-10-CM

## 2014-10-06 MED ORDER — OXYCODONE-ACETAMINOPHEN 5-325 MG PO TABS: 1.0000 | ORAL_TABLET | ORAL | Status: DC | PRN

## 2014-10-06 NOTE — Patient Instructions (Signed)

## 2014-10-07 ENCOUNTER — Telehealth: Payer: Self-pay

## 2014-10-07 NOTE — Progress Notes (Signed)
GYNECOLOGY PROGRESS NOTE  Subjective:    Patient ID: [name], female    DOB: [birth year], 34 y.o.   MRN: [id number]  HPI  Patient is a 34 y.o. [id number] female who presents for complaints of worsening pelvic pain and back pain x 5 days.  Notes that pain is no longer controlled with Tramadol.  Pain has also gone from being unilateral (left sided usually, to bilateral).  Pain is beginning in back and radiating to legs.  Still notes usual left sided pain (likely secondary to endometriosis).  Does note that after changing from Depo Lupron to Depo Provera, overall pelvic pain from endometriosis has been better.  Did note hot flushes in 1st 2 weeks of receiving Depo Provera, but is unsure if this is due to the Lupron still being in her system, or if it was from then Depo Provera. However hot flushes have tapered.  Still has insomnia, for which she takes Ambien as needed.   The following portions of the patient's history were reviewed and updated as appropriate: allergies, current medications, past family history, past medical history, past social history, past surgical history and problem list.  Review of Systems Pertinent items are noted in HPI.   Objective:   Blood pressure 131/87, pulse 84, height 5\' 5"  (1.651 m), weight 219 lb 9.6 oz (99.61 kg), last menstrual period 11/16/2011, not currently breastfeeding. General appearance: alert and no distress Abdomen: soft, non-tender; bowel sounds normal; no masses,  no organomegaly  Back: Back: no scoliosis present, no tenderness to percussion or palpation, symmetric, no curvature. ROM normal. No CVA tenderness. Pelvic: deferred Extremities: extremities normal, atraumatic, no cyanosis or edema Neurologic: Grossly normal   Assessment:   1. Chronic pelvic pain with h/o endometriosis 2. Back pain with sciatica  Plan:   1. Will prescribe Percocet for moderate to severe pain.  Can still use Tramadol for milder pain as needed. Has been referred  to pelvic floor physical therapy for management of endometriosis. Has appointment scheduled for next Depo Provera injection in 2 months. 2. Patient with worsening back pain, now with radiation of pain down legs bilaterally (usually just 1 sided).  Will order lumbar X-ray to r/o possible disc herniation.  Discussed stretches and conservative management for sciatica.  If negative X-ray, will notify of results by phone.  If positive findings, to schedule patient for appointment to discuss further management.    Hildred Laser, MD [facility]

## 2014-10-07 NOTE — Telephone Encounter (Signed)
Lm informing pt of normal lab results. Advised pt to call back with any questions or concerns.

## 2014-10-07 NOTE — Telephone Encounter (Signed)
-----   Message from Hildred Laser, MD sent at 10/06/2014  5:15 PM EDT ----- Please inform patient that X-ray was normal.

## 2014-10-14 ENCOUNTER — Encounter: Payer: Self-pay | Admitting: Obstetrics and Gynecology

## 2014-10-14 ENCOUNTER — Ambulatory Visit: Payer: BLUE CROSS/BLUE SHIELD | Admitting: Physical Therapy

## 2014-10-16 ENCOUNTER — Telehealth: Payer: Self-pay

## 2014-10-16 NOTE — Telephone Encounter (Signed)
See MyChart message sent to Dr.Cherry.

## 2014-10-17 ENCOUNTER — Other Ambulatory Visit: Payer: Self-pay | Admitting: Obstetrics and Gynecology

## 2014-10-17 MED ORDER — TRAMADOL HCL 50 MG PO TABS: 50.0000 mg | ORAL_TABLET | Freq: Four times a day (QID) | ORAL | Status: DC | PRN

## 2014-10-17 NOTE — Progress Notes (Unsigned)
Patient requesting refill on Tramadol.  Will order.

## 2014-11-08 ENCOUNTER — Encounter: Payer: Self-pay | Admitting: Obstetrics and Gynecology

## 2014-11-12 ENCOUNTER — Telehealth: Payer: Self-pay | Admitting: Obstetrics and Gynecology

## 2014-11-12 ENCOUNTER — Other Ambulatory Visit: Payer: Self-pay | Admitting: Obstetrics and Gynecology

## 2014-11-12 DIAGNOSIS — R102 Pelvic and perineal pain: Principal | ICD-10-CM

## 2014-11-12 DIAGNOSIS — G8929 Other chronic pain: Secondary | ICD-10-CM

## 2014-11-12 MED ORDER — OXYCODONE-ACETAMINOPHEN 5-325 MG PO TABS: 1.0000 | ORAL_TABLET | ORAL | Status: DC | PRN

## 2014-11-12 NOTE — Telephone Encounter (Signed)
Needs refill percocet/ she needs [name] ? Hasn't had one since April

## 2014-11-13 ENCOUNTER — Ambulatory Visit: Payer: BLUE CROSS/BLUE SHIELD

## 2014-11-13 DIAGNOSIS — G8929 Other chronic pain: Secondary | ICD-10-CM

## 2014-11-13 DIAGNOSIS — N949 Unspecified condition associated with female genital organs and menstrual cycle: Secondary | ICD-10-CM | POA: Diagnosis not present

## 2014-11-13 DIAGNOSIS — R102 Pelvic and perineal pain: Principal | ICD-10-CM

## 2014-11-17 NOTE — Telephone Encounter (Signed)
Dr.Cherry, this pt is requesting an RX for percocet. Last refill was 10/06/14 #60 for 5-325mg . Also wants U/S. Per last note pt was to have an x-ray.

## 2014-11-24 ENCOUNTER — Telehealth: Payer: Self-pay

## 2014-11-24 ENCOUNTER — Encounter: Payer: Self-pay | Admitting: Obstetrics and Gynecology

## 2014-11-24 NOTE — Telephone Encounter (Signed)
Pt aware. She states she has sent in a message to [facility] to inform her of the pelvic pain.

## 2014-11-24 NOTE — Telephone Encounter (Signed)
-----   Message from Anika Cherry, MD sent at 11/24/2014  8:45 AM EDT ----- Regarding: normal ultrasound Please inform patient of normal ultrasound results (i.e. No cysts on ovaries).  Please follow up on left sided pelvic pain.  

## 2014-11-24 NOTE — Telephone Encounter (Signed)
-----   Message from Hildred Laser, MD sent at 11/24/2014  8:45 AM EDT ----- Regarding: normal ultrasound Please inform patient of normal ultrasound results (i.e. No cysts on ovaries).  Please follow up on left sided pelvic pain.

## 2014-11-30 ENCOUNTER — Ambulatory Visit (INDEPENDENT_AMBULATORY_CARE_PROVIDER_SITE_OTHER): Payer: BLUE CROSS/BLUE SHIELD | Admitting: Obstetrics and Gynecology

## 2014-11-30 VITALS — BP 140/87 | HR 74 | Ht 65.0 in | Wt 214.6 lb

## 2014-11-30 DIAGNOSIS — N809 Endometriosis, unspecified: Secondary | ICD-10-CM | POA: Insufficient documentation

## 2014-11-30 DIAGNOSIS — R102 Pelvic and perineal pain: Secondary | ICD-10-CM

## 2014-11-30 DIAGNOSIS — G8929 Other chronic pain: Secondary | ICD-10-CM | POA: Insufficient documentation

## 2014-11-30 MED ORDER — MEDROXYPROGESTERONE ACETATE 150 MG/ML IM SUSP
150.0000 mg | Freq: Once | INTRAMUSCULAR | Status: AC
  Administered 2014-11-30: 150 mg via INTRAMUSCULAR

## 2014-11-30 NOTE — Progress Notes (Signed)
Patient ID: [name], female   DOB: 03-27-1981, 34 y.o.   MRN: [id number] Pt here for depo-provera injection for endometriosis. Pt states she has no side effects from the medication and would like to speak to Dr. Valentino Saxon regarding surgery. Dr. Valentino Saxon will come to office and speak with pt.

## 2014-11-30 NOTE — Progress Notes (Signed)
GYNECOLOGY PROGRESS NOTE  Subjective:    Patient ID: [name], female    DOB: August 10, 1980, 34 y.o.   MRN: [id number]  HPI  Patient is a 34 y.o. G61P1102 female who presents for routine Depo Provera injection for management of endometriosis, and with continued complaints of worsening intermittent left sided pelvic pain.  Is taking pain medications (Percocet) as prescribed, and helps to control the pain most times, however patient now desiring surgical management, and does not wish to continue pain medications long term.  Had recent ultrasound to evaluate left side for possible cyst formation as patient has had cysts in the past, but ultrasound was normal.  Presents to discuss surgical intervention with removal of left adnexa.   The following portions of the patient's history were reviewed and updated as appropriate: allergies, current medications, past family history, past medical history, past social history, past surgical history and problem list.  Review of Systems Pertinent items are noted in HPI.   Objective:   Blood pressure 140/87, pulse 74, height  (1.651 m), weight 214 lb 9 oz (97.325 kg), last menstrual period 10/15/2013, not currently breastfeeding. General appearance: alert and no distress Abdomen: soft, non-tender; bowel sounds normal; no masses,  no organomegaly  Pelvic: deferred.    Imaging 11/13/2014:  Findings:  The uterus is surgically absent.  Right Ovary measures 2.9 x 1.2 x 2.3 cm. It is normal in appearance. Left Ovary measures 3.1 x 1.2 x 3.0 cm. It is normal appearance. Survey of the adnexa demonstrates no adnexal masses. There is no free fluid in the cul de sac.  Ovaries were not well visualized transvaginally due to bowel. Both ovaries were seen transabdominally and appeared within normal limits. Also limited to body habitus.  Assessment:   1. Chronic pelvic pain (mostly left sided) with h/o endometriosis  Plan:   1. Continue Percocet for  moderate to severe pain.  Can still use Tramadol for milder pain as needed.  2.  Received Depo Provera injection today.  3. Desires surgical intervention.  Patient desires surgical management with USO.  Discussion had on removal of unilateral or bilateral ovaries, including risks vs benefits.  Desires unilateral (left sided removal). Can perform laparoscopically. Desires to have surgery in November due to other family obligations prior to this time.  Will have patient return in 3 months for pre-op. Tentatively scheduled surgery for 02/22/2015.    A total of 10 minutes were spent face-to-face with the patient during this encounter and over half of that time dealt with counseling and coordination of care.   Hildred Laser, MD [facility]

## 2014-12-07 ENCOUNTER — Encounter: Payer: Self-pay | Admitting: Internal Medicine

## 2014-12-10 ENCOUNTER — Encounter: Payer: Self-pay | Admitting: Obstetrics and Gynecology

## 2014-12-11 ENCOUNTER — Other Ambulatory Visit: Payer: Self-pay | Admitting: Obstetrics and Gynecology

## 2014-12-11 ENCOUNTER — Telehealth: Payer: Self-pay

## 2014-12-11 MED ORDER — TRAMADOL HCL 50 MG PO TABS: 50.0000 mg | ORAL_TABLET | Freq: Four times a day (QID) | ORAL | Status: DC | PRN

## 2014-12-11 NOTE — Telephone Encounter (Signed)
Tramadol phoned in her [facility]. Will forward message to Ricky Stabs about surgery date/info. Jonathon BellowsVictorino Dike would like a call back regarding her surgery date and how much she has to pay up front. I have sent pt a my chart message letting her know you will contact her. thanks

## 2014-12-16 ENCOUNTER — Encounter: Payer: Self-pay | Admitting: Obstetrics and Gynecology

## 2015-01-13 ENCOUNTER — Encounter: Payer: Self-pay | Admitting: Obstetrics and Gynecology

## 2015-01-14 ENCOUNTER — Telehealth: Payer: Self-pay

## 2015-01-14 ENCOUNTER — Other Ambulatory Visit: Payer: Self-pay

## 2015-01-14 ENCOUNTER — Encounter: Payer: Self-pay | Admitting: Obstetrics and Gynecology

## 2015-01-14 MED ORDER — OXYCODONE-ACETAMINOPHEN 5-325 MG PO TABS: 1.0000 | ORAL_TABLET | ORAL | Status: DC | PRN

## 2015-01-14 NOTE — Telephone Encounter (Signed)
Prescription filled.

## 2015-01-18 NOTE — Telephone Encounter (Signed)
Kc spoke with pt in person on 9/29. When she came in to p/u her rx.

## 2015-02-01 ENCOUNTER — Encounter: Payer: Self-pay | Admitting: Obstetrics and Gynecology

## 2015-02-09 ENCOUNTER — Encounter: Payer: Self-pay | Admitting: Obstetrics and Gynecology

## 2015-02-09 ENCOUNTER — Encounter
Admission: RE | Admit: 2015-02-09 | Discharge: 2015-02-09 | Disposition: A | Discharge status: Home / Self Care | Payer: BLUE CROSS/BLUE SHIELD | Source: Ambulatory Visit | Attending: Obstetrics and Gynecology | Admitting: Obstetrics and Gynecology

## 2015-02-09 ENCOUNTER — Ambulatory Visit (INDEPENDENT_AMBULATORY_CARE_PROVIDER_SITE_OTHER): Payer: BLUE CROSS/BLUE SHIELD | Admitting: Obstetrics and Gynecology

## 2015-02-09 VITALS — BP 152/95 | HR 77 | Resp 16 | Ht 65.0 in | Wt 208.6 lb

## 2015-02-09 DIAGNOSIS — R102 Pelvic and perineal pain: Secondary | ICD-10-CM | POA: Diagnosis not present

## 2015-02-09 DIAGNOSIS — Z01818 Encounter for other preprocedural examination: Secondary | ICD-10-CM | POA: Insufficient documentation

## 2015-02-09 DIAGNOSIS — G8929 Other chronic pain: Secondary | ICD-10-CM | POA: Insufficient documentation

## 2015-02-09 DIAGNOSIS — J029 Acute pharyngitis, unspecified: Secondary | ICD-10-CM | POA: Diagnosis not present

## 2015-02-09 LAB — CBC
HCT: 41.9 % (ref 35.0–47.0)
Hemoglobin: 14.1 g/dL (ref 12.0–16.0)
MCH: 29.5 pg (ref 26.0–34.0)
MCHC: 33.7 g/dL (ref 32.0–36.0)
MCV: 87.7 fL (ref 80.0–100.0)
Platelets: 343 10*3/uL (ref 150–440)
RBC: 4.77 MIL/uL (ref 3.80–5.20)
RDW: 12.6 % (ref 11.5–14.5)
WBC: 7 10*3/uL (ref 3.6–11.0)

## 2015-02-09 LAB — TYPE AND SCREEN
ABO/RH(D): O POS
Antibody Screen: NEGATIVE

## 2015-02-09 LAB — SURGICAL PCR SCREEN
MRSA, PCR: NEGATIVE
Staphylococcus aureus: NEGATIVE

## 2015-02-09 LAB — ABO/RH: ABO/RH(D): O POS

## 2015-02-09 LAB — POCT RAPID STREP A (OFFICE): Rapid Strep A Screen: NEGATIVE

## 2015-02-09 NOTE — Patient Instructions (Signed)
You will be contacted by the surgical scheduler regarding the time of your surgery Routine preoperative instructions of having nothing to eat or drink after midnight on the day prior to surgery and also coming to the hospital 1.5 hours prior to her time of surgery are emphasized.   Printed patient education handouts about the procedure were given to the patient to review at home.

## 2015-02-09 NOTE — H&P (Signed)
Preoperative History and Physical  [name] is a 34 y.o. [id number] here for surgical management of chronic left pelvic pain and endometriosis.   Significant preoperative concerns: Patient has had laparoscopic hysterectomy plus 3 other operative laparoscopies for lysis of adhesions/excision of endometriosis.   Proposed surgery: Laparoscopic left oophorectomy, bilateral salpingectomy, possible excision/fulguration of endometriosis.  Past Medical History  Diagnosis Date  . Hyperlipidemia     diet controlled, no meds  . Hypertension   . Pregnancy induced hypertension 2008  . Abnormal Pap smear   . Endometriosis   . Anxiety   . Anxiety   . PONV (postoperative nausea and vomiting)   . HA (headache)     otc med prn   Past Surgical History  Procedure Laterality Date  . Tonsillectomy    . Tympanostomy tube placement    . Cesarean section  2008  . Laparoscopy  12/01/2011    Procedure: LAPAROSCOPY OPERATIVE;  Surgeon: Leslie Andrea, MD;  Location: [facility];  Service: Gynecology;  Laterality: N/A;  . Esophageal biopsy  12/01/2011    Procedure: BIOPSY;  Surgeon: Leslie Andrea, MD;  Location: [facility];  Service: Gynecology;  Laterality: N/A;  . Endometrial biopsy    . Colposcopy      as teenager, results WNL  . Cesarean section with bilateral tubal ligation N/A 12/02/2012    Procedure: Repeat Cesarean Section Delivery Baby Girl @ 1826, Apgars 8/9, Bilateral Tubal Ligation;  Surgeon: Leslie Andrea, MD;  Location: [facility];  Service: Obstetrics;  Laterality: N/A;  . Abdominal hysterectomy  7/15    for endometriosis  . Tubal ligation    . Tonsillectomy    . Laparoscopy N/A 06/04/2014    Procedure: LAPAROSCOPY OPERATIVE;  Surgeon: Leslie Andrea, MD;  Location: [facility];  Service: Gynecology;  Laterality: N/A;  . Laparoscopic lysis of adhesions N/A 06/04/2014    Procedure: LAPAROSCOPIC LYSIS OF ADHESIONS;  Surgeon: Leslie Andrea, MD;  Location: [facility];  Service: Gynecology;   Laterality: N/A;  . Ablation on endometriosis N/A 06/04/2014    Procedure: ABLATION ON ENDOMETRIOSIS;  Surgeon: Leslie Andrea, MD;  Location: [facility];  Service: Gynecology;  Laterality: N/A;   OB History  Gravida Para Term Preterm AB SAB TAB Ectopic Multiple Living  # Outcome Date GA Lbr Len/2nd Weight Sex Delivery Anes PTL Lv  2 Preterm 12/02/12 [redacted]w[redacted]d  6 lb 6.8 oz (2.915 kg) F CS-LTranv Spinal  Y  1 Term 2008 [redacted]w[redacted]d  6 lb 14 oz (3.118 kg) F CS-LTranv Spinal N Y     Comments:  pre-e on MAG, IOL. failure to progress    Patient denies any other pertinent gynecologic issues.   Current Outpatient Prescriptions on File Prior to Visit  Medication Sig Dispense Refill  . bisoprolol-hydrochlorothiazide (ZIAC) 10-6.25 MG per tablet Take 1 tablet by mouth daily.     Marland Kitchen ibuprofen (ADVIL) 200 MG tablet Take 3 tablets (600 mg total) by mouth every 6 (six) hours as needed. 30 tablet 0  . losartan (COZAAR) 100 MG tablet Take by mouth.    . traMADol (ULTRAM) 50 MG tablet Take 1 tablet (50 mg total) by mouth every 6 (six) hours as needed. 60 tablet 2  . zolpidem (AMBIEN) 5 MG tablet Take 1 tablet (5 mg total) by mouth at bedtime as needed for sleep. 30 tablet 3  . clonazePAM (  KLONOPIN) 0.5 MG tablet take 1 tablet by mouth daily if needed for anxiety  0  . estradiol (ESTRACE) 1 MG tablet Take 1 mg by mouth 2 (two) times daily.  0  . medroxyPROGESTERone (DEPO-PROVERA) 150 MG/ML injection inject intramuscularly every 3 months  0  . oxyCODONE-acetaminophen (PERCOCET/ROXICET) 5-325 MG tablet Take 1-2 tablets by mouth every 4 (four) hours as needed for severe pain. (Patient not taking: Reported on 02/09/2015) 60 tablet 0   No current facility-administered medications on file prior to visit.   Allergies  Allergen Reactions  . Amoxil [Amoxicillin] Rash  . Azithromycin Rash  . Cefprozil Rash  . Ceftin [Cefuroxime Axetil] Rash  . Other Itching    CHG-wipes she had to use prier to surgery.   Ok to use CHG wash without problems  . Penicillins Rash  . Sulfamethoxazole-Trimethoprim Swelling and Rash    Social History  Substance Use Topics  . Smoking status: Never Smoker   . Smokeless tobacco: Never Used  . Alcohol Use: Yes     Comment: occasional    Family History  Problem Relation Age of Onset  . Hypertension Father   . Coronary artery disease Paternal Uncle   . Cancer Paternal Grandfather   . Diabetes Neg Hx     Review of Systems:   Constitutional: negative for chills, fatigue, fevers and sweats Eyes: negative for irritation, redness and visual disturbance Ears, nose, mouth, throat, and face: positive for nasal congestion and mild sore throat (h/o daughter with strep throat last week); negative for hearing loss, snoring and tinnitus Respiratory: negative for asthma, cough, sputum Cardiovascular: negative for chest pain, dyspnea, exertional chest pressure/discomfort, irregular heart beat, palpitations and syncope Gastrointestinal: negative for abdominal pain, change in bowel habits, nausea and vomiting Genitourinary:  Positive for pelvic pain (mostly left sided, radiates around to ack and down leg), intermittent vaginal spotting/postcoital bleeding; negative for abnormal menstrual periods, genital lesions, sexual problems and vaginal discharge, dysuria and urinary incontinence Integument/breast: negative for breast lump, breast tenderness and nipple discharge Hematologic/lymphatic: negative for bleeding and easy bruising Musculoskeletal:negative for back pain and muscle weakness Neurological: negative for dizziness, headaches, vertigo and weakness Endocrine: negative for diabetic symptoms including polydipsia, polyuria and skin dryness Allergic/Immunologic: negative for hay fever and urticaria     PHYSICAL EXAM: Blood pressure 152/95, pulse 77, resp. rate 16, height 5\' 5"  (1.651 m), weight 208 lb 9.6 oz (94.62 kg), last menstrual period 10/15/2013. CONSTITUTIONAL:  Well-developed, well-nourished female in no acute distress.  HENT:  Normocephalic, atraumatic, External right and left ear normal. Oropharynx is clear and moist EYES: Conjunctivae and EOM are normal. Pupils are equal, round, and reactive to light. No scleral icterus.  NECK: Normal range of motion, supple, no masses SKIN: Skin is warm and dry. No rash noted. Not diaphoretic. No erythema. No pallor. NEUROLGIC: Alert and oriented to person, place, and time. Normal reflexes, muscle tone coordination. No cranial nerve deficit noted. PSYCHIATRIC: Normal mood and affect. Normal behavior. Normal judgment and thought content. CARDIOVASCULAR: Normal heart rate noted, regular rhythm RESPIRATORY: Effort and breath sounds normal, no problems with respiration noted ABDOMEN: Soft, nontender, nondistended. PELVIC: Deferred MUSCULOSKELETAL: Normal range of motion. No edema and no tenderness. 2+ distal pulses.  Labs: Results for orders placed or performed in visit on 02/09/15 (from the past 336 hour(s))  POCT rapid strep A   Collection Time: 02/09/15 12:20 PM  Result Value Ref Range   Rapid Strep A Screen Negative Negative    Imaging Studies: 11/13/2014  Pelvic Ultrasound -  Findings:  The uterus is surgically absent.  Right Ovary measures 2.9 x 1.2 x 2.3 cm. It is normal in appearance. Left Ovary measures 3.1 x 1.2 x 3.0 cm. It is normal appearance. Survey of the adnexa demonstrates no adnexal masses. There is no free fluid in the cul de sac.  Ovaries were not well visualized transvaginally due to bowel. Both ovaries were seen transabdominally and appeared within normal limits. Also limited to body habitus.  Impression: 1. Normal appearing pelvic ultrasound.  Recommendations: 1.Clinical correlation with the patient's History and Physical Exam.  Assessment: Patient Active Problem List   Diagnosis Date Noted  . Pelvic pain in female 11/30/2014  . Endometriosis 11/30/2014  . Anxiety 09/01/2014   . Essential (primary) hypertension 09/01/2014  . Cyst of ovary 09/01/2014  . Class 2 obesity 09/01/2014  . Abdominal cramping 12/11/2013  . Metabolic syndrome 04/06/2011  . SLEEP DISORDER 12/20/2007  . ALLERGIC RHINITIS 07/18/2007  . HYPERLIPIDEMIA 02/19/2007    Plan: Patient will undergo surgical management with laparoscopic left oophorectomy, bilateral salpingectomy, possible excision/fulguration of endometriosis.  Scheduled for 02/15/2015. The risks of surgery were discussed in detail with the patient including but not limited to: bleeding which may require transfusion or reoperation; infection which may require antibiotics; injury to surrounding organs which may involve bowel, bladder, ureters; need for additional procedures including laparoscopy or laparotomy; thromboembolic phenomenon, surgical site problems and other postoperative/anesthesia complications. Likelihood of success in alleviating the patient's condition was discussed. Routine postoperative instructions will be reviewed with the patient and her family in detail after surgery.  The patient concurred with the proposed plan, giving informed written consent for the surgery.  Patient advised on being NPO beginning midnight prior to surgery.  To discontinue NSAIDs 7 days prior to surgery.    Hildred Laser, MD [facility]

## 2015-02-09 NOTE — Progress Notes (Signed)
GYNECOLOGY PROGRESS NOTE  Subjective:    Patient ID: [name], female    DOB: Aug 01, 1980, 34 y.o.   MRN: [id number]  HPI  Patient is a 34 y.o. 672P1102 female who presents for pre-operative exam for chronic left sided pelvic pain and h/o endometriosis.    The following portions of the patient's history were reviewed and updated as appropriate: allergies, current medications, past family history, past medical history, past social history, past surgical history and problem list.  Review of Systems A comprehensive review of systems was negative except for: Ears, nose, mouth, throat, and face: positive for nasal congestion and sore throat Genitourinary: positive for intermenstrual spotting/bleeding, pelvic pain   Objective:   Blood pressure 152/95, pulse 77, resp. rate 16, height 5\' 5"  (1.651 m), weight 208 lb 9.6 oz (94.62 kg), last menstrual period 10/15/2013. General appearance: alert and no distress  HEENT: Head: Normocephalic, without obvious abnormality, atraumatic Eyes: negative findings: conjunctivae and sclerae normal Ears: normal TM's and external ear canals both ears Nose: Nares normal. Septum midline. Mucosa normal. No drainage or sinus tenderness.  Throat: lips, mucosa, and tongue normal; teeth and gums normal Neck: no adenopathy, no carotid bruit, no JVD, supple, symmetrical, trachea midline and thyroid not enlarged, symmetric, no tenderness/mass/nodules Abdomen: normal findings: no masses palpable and soft and abnormal findings:  tenderness in LLQ.   Pelvic: deferred Extremities: extremities normal, atraumatic, no cyanosis or edema Neurologic: Grossly normal   Assessment:   Sore throat  Pre-operative exam Chronic pelvic pain  Plan:   possible excision/fulguration of endometriosis. Scheduled for 02/15/2015. The risks of surgery were discussed in detail with the patient including but not limited to: bleeding which may require transfusion or reoperation;  infection which may require antibiotics; injury to surrounding organs which may involve bowel, bladder, ureters; need for additional procedures including laparoscopy or laparotomy; thromboembolic phenomenon, surgical site problems and other postoperative/anesthesia complications. Likelihood of success in alleviating the patient's condition was discussed. Routine postoperative instructions will be reviewed with the patient and her family in detail after surgery. The patient concurred with the proposed plan, giving informed written consent for the surgery. Patient advised on being NPO beginning midnight prior to surgery. To discontinue NSAIDs 7 days prior to surgery.  Rapid strep performed today (due to h/o recent exposure and symptoms), negative.

## 2015-02-09 NOTE — Patient Instructions (Signed)
  Your procedure is scheduled on: 02/15/15 Report to Day Surgery. To find out your arrival time please call [phone] between 1PM - 3PM on 02/12/15  Remember: Instructions that are not followed completely may result in serious medical risk, up to and including death, or upon the discretion of your surgeon and anesthesiologist your surgery may need to be rescheduled.    __x__ 1. Do not eat food or drink liquids after midnight. No gum chewing or hard candies.     ___x_ 2. No Alcohol for 24 hours before or after surgery.   ____ 3. Bring all medications with you on the day of surgery if instructed.    _x___ 4. Notify your doctor if there is any change in your medical condition     (cold, fever, infections).     Do not wear jewelry, make-up, hairpins, clips or nail polish.  Do not wear lotions, powders, or perfumes. You may wear deodorant.  Do not shave 48 hours prior to surgery. Men may shave face and neck.  Do not bring valuables to the hospital.    [facility] is not responsible for any belongings or valuables.               Contacts, dentures or bridgework may not be worn into surgery.  Leave your suitcase in the car. After surgery it may be brought to your room.  For patients admitted to the hospital, discharge time is determined by your                treatment team.   Patients discharged the day of surgery will not be allowed to drive home.   Please read over the following fact sheets that you were given:   Surgical Site Infection Prevention   ____ Take these medicines the morning of surgery with A SIP OF WATER:    1. Bring bisoprolol/hctz to hospital  2. May take tramadol if needed  3.   4.  5.  6.  ____ Fleet Enema (as directed)   __x__ Use CHG Soap as directed  ____ Use inhalers on the day of surgery  ____ Stop metformin 2 days prior to surgery    ____ Take 1/2 of usual insulin dose the night before surgery and none on the morning of surgery.   ____ Stop  Coumadin/Plavix/aspirin on   _x___ Stop Anti-inflammatories on today May take tylenol   ____ Stop supplements until after surgery.    ____ Bring C-Pap to the hospital.

## 2015-02-15 ENCOUNTER — Ambulatory Visit: Payer: BLUE CROSS/BLUE SHIELD | Admitting: Anesthesiology

## 2015-02-15 ENCOUNTER — Ambulatory Visit
Admission: RE | Admit: 2015-02-15 | Discharge: 2015-02-15 | Disposition: A | Discharge status: Home / Self Care | Payer: BLUE CROSS/BLUE SHIELD | Source: Ambulatory Visit | Attending: Obstetrics and Gynecology | Admitting: Obstetrics and Gynecology

## 2015-02-15 ENCOUNTER — Encounter
Admission: RE | Disposition: A | Discharge status: Home / Self Care | Payer: Self-pay | Source: Ambulatory Visit | Attending: Obstetrics and Gynecology

## 2015-02-15 ENCOUNTER — Encounter: Payer: Self-pay | Admitting: *Deleted

## 2015-02-15 DIAGNOSIS — I10 Essential (primary) hypertension: Secondary | ICD-10-CM | POA: Insufficient documentation

## 2015-02-15 DIAGNOSIS — G8929 Other chronic pain: Secondary | ICD-10-CM | POA: Insufficient documentation

## 2015-02-15 DIAGNOSIS — Z888 Allergy status to other drugs, medicaments and biological substances status: Secondary | ICD-10-CM | POA: Insufficient documentation

## 2015-02-15 DIAGNOSIS — F419 Anxiety disorder, unspecified: Secondary | ICD-10-CM | POA: Diagnosis not present

## 2015-02-15 DIAGNOSIS — Z90710 Acquired absence of both cervix and uterus: Secondary | ICD-10-CM | POA: Diagnosis not present

## 2015-02-15 DIAGNOSIS — Z79899 Other long term (current) drug therapy: Secondary | ICD-10-CM | POA: Insufficient documentation

## 2015-02-15 DIAGNOSIS — N736 Female pelvic peritoneal adhesions (postinfective): Secondary | ICD-10-CM | POA: Insufficient documentation

## 2015-02-15 DIAGNOSIS — N803 Endometriosis of pelvic peritoneum: Secondary | ICD-10-CM | POA: Diagnosis not present

## 2015-02-15 DIAGNOSIS — Z880 Allergy status to penicillin: Secondary | ICD-10-CM | POA: Insufficient documentation

## 2015-02-15 DIAGNOSIS — Z8249 Family history of ischemic heart disease and other diseases of the circulatory system: Secondary | ICD-10-CM | POA: Diagnosis not present

## 2015-02-15 DIAGNOSIS — D649 Anemia, unspecified: Secondary | ICD-10-CM | POA: Insufficient documentation

## 2015-02-15 DIAGNOSIS — E785 Hyperlipidemia, unspecified: Secondary | ICD-10-CM | POA: Insufficient documentation

## 2015-02-15 DIAGNOSIS — Z809 Family history of malignant neoplasm, unspecified: Secondary | ICD-10-CM | POA: Diagnosis not present

## 2015-02-15 DIAGNOSIS — Z882 Allergy status to sulfonamides status: Secondary | ICD-10-CM | POA: Diagnosis not present

## 2015-02-15 DIAGNOSIS — R102 Pelvic and perineal pain: Secondary | ICD-10-CM | POA: Diagnosis not present

## 2015-02-15 HISTORY — PX: LAPAROSCOPIC BILATERAL SALPINGECTOMY: SHX5889

## 2015-02-15 SURGERY — OOPHORECTOMY, LAPAROSCOPIC
Anesthesia: General | Laterality: Left | Wound class: Clean Contaminated

## 2015-02-15 MED ORDER — ONDANSETRON HCL 4 MG/2ML IJ SOLN: 4.0000 mg | Freq: Once | INTRAMUSCULAR | Status: DC | PRN

## 2015-02-15 MED ORDER — FENTANYL CITRATE (PF) 100 MCG/2ML IJ SOLN
INTRAMUSCULAR | Status: DC | PRN
  Administered 2015-02-15 (×3): 50 ug via INTRAVENOUS

## 2015-02-15 MED ORDER — DEXAMETHASONE SODIUM PHOSPHATE 4 MG/ML IJ SOLN
INTRAMUSCULAR | Status: DC | PRN
  Administered 2015-02-15: 5 mg via INTRAVENOUS

## 2015-02-15 MED ORDER — SCOPOLAMINE 1 MG/3DAYS TD PT72
MEDICATED_PATCH | TRANSDERMAL | Status: AC
  Filled 2015-02-15: qty 1

## 2015-02-15 MED ORDER — SUCCINYLCHOLINE CHLORIDE 20 MG/ML IJ SOLN
INTRAMUSCULAR | Status: DC | PRN
Start: 2015-02-15 — End: 2015-02-15
  Administered 2015-02-15: 100 mg via INTRAVENOUS

## 2015-02-15 MED ORDER — ONDANSETRON HCL 4 MG/2ML IJ SOLN
INTRAMUSCULAR | Status: DC | PRN
  Administered 2015-02-15: 4 mg via INTRAVENOUS

## 2015-02-15 MED ORDER — LIDOCAINE HCL (CARDIAC) 20 MG/ML IV SOLN
INTRAVENOUS | Status: DC | PRN
Start: 2015-02-15 — End: 2015-02-15
  Administered 2015-02-15: 60 mg via INTRAVENOUS

## 2015-02-15 MED ORDER — ROCURONIUM BROMIDE 100 MG/10ML IV SOLN
INTRAVENOUS | Status: DC | PRN
  Administered 2015-02-15: 30 mg via INTRAVENOUS
  Administered 2015-02-15: 20 mg via INTRAVENOUS

## 2015-02-15 MED ORDER — FAMOTIDINE 20 MG PO TABS
20.0000 mg | ORAL_TABLET | Freq: Once | ORAL | Status: AC
  Administered 2015-02-15: 20 mg via ORAL

## 2015-02-15 MED ORDER — OXYCODONE-ACETAMINOPHEN 5-325 MG PO TABS
ORAL_TABLET | ORAL | Status: AC
  Filled 2015-02-15: qty 1

## 2015-02-15 MED ORDER — KETOROLAC TROMETHAMINE 30 MG/ML IJ SOLN
INTRAMUSCULAR | Status: DC | PRN
  Administered 2015-02-15: 30 mg via INTRAVENOUS

## 2015-02-15 MED ORDER — OXYCODONE-ACETAMINOPHEN 5-325 MG PO TABS
1.0000 | ORAL_TABLET | ORAL | Status: DC | PRN
  Administered 2015-02-15: 1 via ORAL

## 2015-02-15 MED ORDER — FENTANYL CITRATE (PF) 100 MCG/2ML IJ SOLN
25.0000 ug | INTRAMUSCULAR | Status: DC | PRN
  Administered 2015-02-15 (×4): 25 ug via INTRAVENOUS

## 2015-02-15 MED ORDER — NEOSTIGMINE METHYLSULFATE 10 MG/10ML IV SOLN
INTRAVENOUS | Status: DC | PRN
  Administered 2015-02-15: 4 mg via INTRAVENOUS

## 2015-02-15 MED ORDER — FAMOTIDINE 20 MG PO TABS
ORAL_TABLET | ORAL | Status: AC
  Filled 2015-02-15: qty 1

## 2015-02-15 MED ORDER — SCOPOLAMINE 1 MG/3DAYS TD PT72
1.0000 | MEDICATED_PATCH | TRANSDERMAL | Status: DC
  Administered 2015-02-15: 1.5 mg via TRANSDERMAL

## 2015-02-15 MED ORDER — PROPOFOL 10 MG/ML IV BOLUS
INTRAVENOUS | Status: DC | PRN
  Administered 2015-02-15: 200 mg via INTRAVENOUS

## 2015-02-15 MED ORDER — BUPIVACAINE HCL 0.5 % IJ SOLN
INTRAMUSCULAR | Status: DC | PRN
  Administered 2015-02-15: 10 mL

## 2015-02-15 MED ORDER — FENTANYL CITRATE (PF) 100 MCG/2ML IJ SOLN
INTRAMUSCULAR | Status: AC
  Filled 2015-02-15: qty 2

## 2015-02-15 MED ORDER — LACTATED RINGERS IV SOLN
INTRAVENOUS | Status: DC
  Administered 2015-02-15 (×2): via INTRAVENOUS

## 2015-02-15 MED ORDER — BUPIVACAINE HCL (PF) 0.5 % IJ SOLN
INTRAMUSCULAR | Status: AC
  Filled 2015-02-15: qty 30

## 2015-02-15 MED ORDER — GLYCOPYRROLATE 0.2 MG/ML IJ SOLN
INTRAMUSCULAR | Status: DC | PRN
  Administered 2015-02-15: .8 mg via INTRAVENOUS

## 2015-02-15 MED ORDER — MIDAZOLAM HCL 2 MG/2ML IJ SOLN
INTRAMUSCULAR | Status: DC | PRN
  Administered 2015-02-15: 2 mg via INTRAVENOUS

## 2015-02-15 MED ORDER — OXYCODONE-ACETAMINOPHEN 5-325 MG PO TABS: 1.0000 | ORAL_TABLET | ORAL | Status: DC | PRN

## 2015-02-15 SURGICAL SUPPLY — 32 items
BLADE SURG SZ11 CARB STEEL (BLADE) ×3 IMPLANT
CANISTER SUCT 1200ML W/VALVE (MISCELLANEOUS) ×3 IMPLANT
CATH ROBINSON RED A/P 16FR (CATHETERS) ×3 IMPLANT
CHLORAPREP W/TINT 26ML (MISCELLANEOUS) ×3 IMPLANT
CORD MONOPOLAR M/FML 12FT (MISCELLANEOUS) ×3 IMPLANT
GLOVE BIO SURGEON STRL SZ 6 (GLOVE) ×3 IMPLANT
GLOVE BIOGEL PI IND STRL 6.5 (GLOVE) ×10 IMPLANT
GLOVE BIOGEL PI INDICATOR 6.5 (GLOVE) ×5
GOWN STRL REUS W/ TWL LRG LVL3 (GOWN DISPOSABLE) ×8 IMPLANT
GOWN STRL REUS W/TWL LRG LVL3 (GOWN DISPOSABLE) ×4
IRRIGATION STRYKERFLOW (MISCELLANEOUS) ×2 IMPLANT
IRRIGATOR STRYKERFLOW (MISCELLANEOUS) ×3
IV LACTATED RINGERS 1000ML (IV SOLUTION) ×3 IMPLANT
KIT RM TURNOVER CYSTO AR (KITS) ×3 IMPLANT
LABEL OR SOLS (LABEL) ×3 IMPLANT
LIQUID BAND (GAUZE/BANDAGES/DRESSINGS) ×3 IMPLANT
NS IRRIG 1000ML POUR BTL (IV SOLUTION) ×3 IMPLANT
NS IRRIG 500ML POUR BTL (IV SOLUTION) ×3 IMPLANT
PACK GYN LAPAROSCOPIC (MISCELLANEOUS) ×3 IMPLANT
PAD OB MATERNITY 4.3X12.25 (PERSONAL CARE ITEMS) ×3 IMPLANT
PAD PREP 24X41 OB/GYN DISP (PERSONAL CARE ITEMS) ×3 IMPLANT
POUCH ENDO CATCH 10MM SPEC (MISCELLANEOUS) IMPLANT
SCISSORS METZENBAUM CVD 33 (INSTRUMENTS) IMPLANT
SHEARS HARMONIC ACE PLUS 36CM (ENDOMECHANICALS) ×3 IMPLANT
SLEEVE ENDOPATH XCEL 5M (ENDOMECHANICALS) ×3 IMPLANT
SUT VIC AB 3-0 SH 27 (SUTURE) ×1
SUT VIC AB 3-0 SH 27X BRD (SUTURE) ×2 IMPLANT
SUT VICRYL 0 AB UR-6 (SUTURE) ×3 IMPLANT
TROCAR ENDO BLADELESS 11MM (ENDOMECHANICALS) ×3 IMPLANT
TROCAR XCEL NON-BLD 5MMX100MML (ENDOMECHANICALS) ×3 IMPLANT
TROCAR XCEL UNIV SLVE 11M 100M (ENDOMECHANICALS) ×3 IMPLANT
TUBING INSUFFLATOR HI FLOW (MISCELLANEOUS) ×3 IMPLANT

## 2015-02-15 NOTE — Anesthesia Procedure Notes (Signed)
Procedure Name: Intubation Date/Time: 02/15/2015 11:29 AM Performed by: Irving BurtonBACHICH, Marijo Pre-anesthesia Checklist: Emergency Drugs available, Patient identified, Suction available and Patient being monitored Patient Re-evaluated:Patient Re-evaluated prior to inductionOxygen Delivery Method: Circle system utilized Preoxygenation: Pre-oxygenation with 100% oxygen Intubation Type: IV induction Ventilation: Mask ventilation without difficulty Laryngoscope Size: Mac and 3 Grade View: Grade I Tube type: Oral Tube size: 7.0 mm Number of attempts: 1 Airway Equipment and Method: Patient positioned with wedge pillow and Stylet Secured at: 21 cm Tube secured with: Tape Dental Injury: Teeth and Oropharynx as per pre-operative assessment

## 2015-02-15 NOTE — Transfer of Care (Signed)
Immediate Anesthesia Transfer of Care Note  Patient: Terri MaladyJennifer L Kisamore  Procedure(s) Performed: Procedure(s): LAPAROSCOPIC OOPHORECTOMY (Left) LAPAROSCOPIC BILATERAL SALPINGECTOMY (Bilateral)  Patient Location: PACU  Anesthesia Type:General  Level of Consciousness: awake, alert  and oriented  Airway & Oxygen Therapy: Patient Spontanous Breathing and Patient connected to face mask oxygen  Post-op Assessment: Report given to RN  Post vital signs: stable  Last Vitals:  Filed Vitals:   02/15/15 1250  BP:   Pulse: 77  Temp:   Resp: 14    Complications: No apparent anesthesia complications

## 2015-02-15 NOTE — H&P (Signed)
UPDATE TO PREVIOUS HISTORY AND PHYSICAL  The patient has been seen and examined.  H&P is up to date, no changes noted. Terri MaladyJennifer L Olejnik is a 34 y.o. [id number] here for surgical management of chronic left pelvic pain and endometriosis. Planned procedure: laparoscopic left oophorectomy, bilateral salpingectomy, possible excision/fulguration of endometriosis. All questions answered.  Surgical site properly marked.  Patient can proceed to the OR for scheduled procedure.   Hildred LaserAnika Emmitte Surgeon, MD 02/15/2015 10:38 AM

## 2015-02-15 NOTE — Op Note (Signed)
Laparoscopic Left Salpingo-Oophorectomy Procedure Note  Indications: The patient is a 34 y.o. 222P1102 female with chronic left pelvic pain and history of endometriosis.  Prior history of hysterectomy and several laparoscopies for adhesiolysis.   Pre-operative Diagnosis: Chronic pelvic pain (mostly left-sided), endometriosis  Post-operative Diagnosis: Same, with pelvic adhesions.   Procedure: Laparoscopic left salpingo-oophorectomy, lysis of adhesions, excision and fulguration of endometriosis  Surgeon: Hildred LaserAnika Kyvon Hu, MD  Assistants: Sharon SellerMartin Defrancesco, MD; Doreene NestElena Klaus, PA-S  Anesthesia: General endotracheal anesthesia  ASA Class: 2   Findings: The anterior cul-de-sac and round ligaments appeared normal.  The uterus appears normal.  The left adnexa was adherent to left pelvic sidewall due to adhesions.  NOrmal appearing left fallopian tube.  Normal appearing right ovary, right fallopian tube surgically absent.  Posterior cul-de-sac appeared normal. There was a purple-brown powder burn lesion along left pelvic sidewall.  Possible yellow-tan powder burn lesions on right pelvic sidewall.   Adhesions noted to anterior abdominal wall and to left pelvic sidewall.    Procedure Details  The patient was seen in the Holding Room. The risks, benefits, complications, treatment options, and expected outcomes were discussed with the patient. The possibilities of reaction to medication, pulmonary aspiration, perforation of viscus, bleeding, recurrent infection, the need for additional procedures, failure to diagnose a condition, and creating a complication requiring transfusion or operation were discussed with the patient. The patient concurred with the proposed plan, giving informed consent. The patient was taken to the Operating Room, identified as [name] and the procedure verified as Operative Laparoscopy (with left salpingo-oophorectomy, lysis of adhesions, excision and fulguration of  endometriosis, and right salpingectomy). A Time Out was held and the above information confirmed.  After induction of general anesthesia, the patient was placed in modified dorsal lithotomy position where she was prepped, draped, and catheterized in the normal, sterile fashion.  A sponge stick was placed inside the vagina for vaginal cuff manipulation.  Attention was then turned to the abdomen, where an umbilical incision was made with the scalpel.  The Optiview 5-mm trocar and sleeve were then advanced without difficulty with the laparoscope under direct visualization into the abdomen.  The abdomen was then insufflated with carbon dioxide gas and adequate pneumoperitoneum was obtained.  A left lower quadrant 5-mm and a right lower quadrant 11-mm port were then placed under direct visualization.  A survey of the patient's pelvis and abdomen revealed the findings as above.  Adhesiolysis of the anterior abdominal wall adhesions was performed for better visualization of the pelvis. The left ovary and tube were noted to be adherent to the sidewall, requiring additional adhesiolysis.  The ureter was noted to be safely away from the area of dissection.     Next, the infundibulopelvic ligament on the patient's left which was clamped using the Harmonic scalpel and ligated. An Endocatch bag was inserted through the 10 mm lateral port site, and the specimen was collected and removed from the abdomen. After this, the site of endometriosis implant was fulgurated using the Kleppinger instrument.   Following the procedure the all port sheaths were removed after intra-abdominal carbon dioxide was expressed. The incisions were closed with subcutaneous and subcuticular sutures of 4-0 Vicryl. Liquiband was placed over the incisions. The sponge stick was then removed from the vagina.  Instrument, sponge, and needle counts were correct prior to abdominal closure and at the conclusion of the case.    Estimated Blood Loss:   Minimal         Drains:  Straight catheterization prior to procedure with 20 ml clear urine.         Total IV Fluids: 1600 mL         Specimens: Left ovary and fallopian tube              Complications:  None; patient tolerated the procedure well.         Disposition: PACU - hemodynamically stable.         Condition: stable    Signed:   Hildred Laser, MD [facility]

## 2015-02-15 NOTE — Anesthesia Preprocedure Evaluation (Signed)
Anesthesia Evaluation  Patient identified by MRN, date of birth, ID band Patient awake    Reviewed: Allergy & Precautions, NPO status , Patient's Chart, lab work & pertinent test results  History of Anesthesia Complications (+) PONV  Airway Mallampati: III       Dental  (+) Teeth Intact   Pulmonary neg pulmonary ROS,           Cardiovascular hypertension, Pt. on medications      Neuro/Psych Anxiety negative neurological ROS     GI/Hepatic negative GI ROS, Neg liver ROS,   Endo/Other  negative endocrine ROS  Renal/GU negative Renal ROS     Musculoskeletal   Abdominal   Peds  Hematology  (+) anemia ,   Anesthesia Other Findings   Reproductive/Obstetrics                             Anesthesia Physical Anesthesia Plan  ASA: II  Anesthesia Plan: General   Post-op Pain Management:    Induction: Intravenous  Airway Management Planned: Oral ETT  Additional Equipment:   Intra-op Plan:   Post-operative Plan:   Informed Consent: I have reviewed the patients History and Physical, chart, labs and discussed the procedure including the risks, benefits and alternatives for the proposed anesthesia with the patient or authorized representative who has indicated his/her understanding and acceptance.     Plan Discussed with:   Anesthesia Plan Comments:         Anesthesia Quick Evaluation

## 2015-02-15 NOTE — Discharge Instructions (Signed)
General Gynecological Post-Operative Instructions You may expect to feel dizzy, weak, and drowsy for as long as 24 hours after receiving the medicine that made you sleep (anesthetic).  Do not drive a car, ride a bicycle, participate in physical activities, or take public transportation until you are done taking narcotic pain medicines or as directed by your doctor.  Do not drink alcohol or take tranquilizers.  Do not take medicine that has not been prescribed by your doctor.  Do not sign important papers or make important decisions while on narcotic pain medicines.  Have a responsible person with you.  CARE OF INCISION  Keep incision clean and dry. Take showers instead of baths until your doctor gives you permission to take baths.  Avoid heavy lifting (more than 10 pounds/4.5 kilograms), pushing, or pulling.  Avoid activities that may risk injury to your surgical site.  No sexual intercourse or placement of anything in the vagina for 2-3 weeks or as instructed by your doctor. If you have tubes coming from the wound site, check with your doctor regarding appropriate care of the tubes. Only take prescription or over-the-counter medicines  for pain, discomfort, or fever as directed by your doctor. Do not take aspirin. It can make you bleed. Take medicines (antibiotics) that kill germs if they are prescribed for you.  Call the office or go to the Emergency Room if:  You feel sick to your stomach (nauseous).  You start to throw up (vomit).  You have trouble eating or drinking.  You have an oral temperature above 101.  You have constipation that is not helped by adjusting diet or increasing fluid intake. Pain medicines are a common cause of constipation.  You have any other concerns. SEEK IMMEDIATE MEDICAL CARE IF:  You have persistent dizziness.  You have difficulty breathing or a congested sounding (croupy) cough.  You have an oral temperature above 102.5, not controlled by medicine.  There is  increasing pain or tenderness near or in the surgical site.

## 2015-02-15 NOTE — Anesthesia Postprocedure Evaluation (Signed)
  Anesthesia Post-op Note  Patient: [name]  Procedure(s) Performed: Procedure(s): LAPAROSCOPIC OOPHORECTOMY (Left) LAPAROSCOPIC BILATERAL SALPINGECTOMY (Bilateral)  Anesthesia type:General  Patient location: PACU  Post pain: Pain level controlled  Post assessment: Post-op Vital signs reviewed, Patient's Cardiovascular Status Stable, Respiratory Function Stable, Patent Airway and No signs of Nausea or vomiting  Post vital signs: Reviewed and stable  Last Vitals:  Filed Vitals:   02/15/15 1400  BP: 147/91  Pulse: 89  Temp:   Resp: 16    Level of consciousness: awake, alert  and patient cooperative  Complications: No apparent anesthesia complications

## 2015-02-16 LAB — SURGICAL PATHOLOGY

## 2015-02-17 ENCOUNTER — Encounter: Payer: BLUE CROSS/BLUE SHIELD | Admitting: Obstetrics and Gynecology

## 2015-02-17 ENCOUNTER — Other Ambulatory Visit: Payer: BLUE CROSS/BLUE SHIELD

## 2015-02-18 ENCOUNTER — Ambulatory Visit (INDEPENDENT_AMBULATORY_CARE_PROVIDER_SITE_OTHER): Payer: BLUE CROSS/BLUE SHIELD | Admitting: Obstetrics and Gynecology

## 2015-02-18 ENCOUNTER — Encounter: Payer: Self-pay | Admitting: Obstetrics and Gynecology

## 2015-02-18 VITALS — BP 126/87 | HR 109 | Temp 99.9°F | Ht 65.0 in | Wt 216.4 lb

## 2015-02-18 DIAGNOSIS — Z20818 Contact with and (suspected) exposure to other bacterial communicable diseases: Secondary | ICD-10-CM

## 2015-02-18 DIAGNOSIS — R6889 Other general symptoms and signs: Secondary | ICD-10-CM

## 2015-02-18 DIAGNOSIS — Z2089 Contact with and (suspected) exposure to other communicable diseases: Secondary | ICD-10-CM | POA: Diagnosis not present

## 2015-02-18 LAB — POCT RAPID STREP A (OFFICE): Rapid Strep A Screen: POSITIVE — AB

## 2015-02-18 LAB — POCT INFLUENZA A/B
Influenza A, POC: NEGATIVE
Influenza B, POC: NEGATIVE

## 2015-02-18 MED ORDER — CEPHALEXIN 500 MG PO CAPS: 500.0000 mg | ORAL_CAPSULE | Freq: Two times a day (BID) | ORAL | Status: DC

## 2015-02-19 ENCOUNTER — Other Ambulatory Visit: Payer: Self-pay | Admitting: Obstetrics and Gynecology

## 2015-02-19 ENCOUNTER — Encounter: Payer: Self-pay | Admitting: Obstetrics and Gynecology

## 2015-02-19 MED ORDER — TRAMADOL HCL 50 MG PO TABS: 50.0000 mg | ORAL_TABLET | Freq: Four times a day (QID) | ORAL | Status: DC | PRN

## 2015-02-19 MED ORDER — OXYCODONE-ACETAMINOPHEN 5-325 MG PO TABS: 1.0000 | ORAL_TABLET | Freq: Four times a day (QID) | ORAL | Status: DC | PRN

## 2015-02-19 NOTE — Progress Notes (Unsigned)
Patient request refill on pain medication for surgical pain.  Notes that she only has a few tablets left, and did not want to go into the weekend without medication.  Refill given.

## 2015-02-20 ENCOUNTER — Encounter: Payer: Self-pay | Admitting: Obstetrics and Gynecology

## 2015-02-21 NOTE — Progress Notes (Signed)
GYNECOLOGY CLINIC PROGRESS NOTE  Subjective:     [name] is a 34 y.o. female who presents to the clinic 3 days status post laparoscopic left oophorectomy, adhesiolysis, and excision/fulguration of endometriosis for pelvic pain and endometriosis.  Patient complains of umbilical incision bleeding through bandage.  Also reports complaints of sore throat, low grade fever, and general malaise and chills.  Patient does report sick contact of daughter with strep throat ~ 2 weeks ago.   The following portions of the patient's history were reviewed and updated as appropriate: allergies, current medications, past family history, past medical history, past social history, past surgical history and problem list.  Review of Systems Pertinent items noted in HPI and remainder of comprehensive ROS otherwise negative.    Objective:    BP 126/87 mmHg  Pulse 109  Temp(Src) 99.9 F (37.7 C) (Oral)  Ht 5\' 5"  (1.651 m)  Wt 216 lb 6.4 oz (98.158 kg)  BMI 36.01 kg/m2  LMP 10/15/2013 General:  alert, fatigued and no distress  HEENT: Head: Normocephalic, without obvious abnormality, atraumatic Eyes: negative findings: conjunctivae and sclerae normal Ears: normal external ear canals bilaterally Nose: Nares normal. Septum midline. Mucosa normal. No drainage or sinus tenderness. Throat: normal findings: lips normal without lesions, tongue midline and normal and oropharynx pink & moist without lesions or evidence of thrush and abnormal findings: exudates present and moderate oropharyngeal erythema Neck: no adenopathy, no carotid bruit, no JVD, supple, symmetrical, trachea midline and thyroid not enlarged, symmetric, no tenderness/mass/nodules   Abdomen: soft, bowel sounds active, mildly tender at incision sites  Incision:   Lateral incision sites healing well, no drainage, no erythema, no hernia, no seroma, no swelling, no dehiscence, incisions well approximated.  Umbilical incision with old blood on  dressing.  Bandage removed, small amount of dark red blood oozing from incision. Small amount of wound separation, no dehiscence, no swelling or erythema.            Labs:  Results for orders placed or performed in visit on 02/18/15  POCT rapid strep A  Result Value Ref Range   Rapid Strep A Screen Positive (A) Negative  POCT Influenza A/B  Result Value Ref Range   Influenza A, POC Negative Negative   Influenza B, POC Negative Negative    Assessment:   Strep throat Wound separation   Plan:    1. Continue any current medications. 2. Wound care discussed.  Pressure dressing applied to umbilical incision site. To leave bandage until post-op check next week.  3. Activity restrictions: no bending, stooping, or squatting and no lifting more than 15 pounds 4. Anticipated return to work: 1-2 weeks. 5. Follow up: 1 week for dressingremoval.   6. Prescription given for Keflex to treat strep throat (patient allergic to amoxicillin).   Hildred LaserAnika Chanta Bauers, MD [facility]

## 2015-02-22 ENCOUNTER — Telehealth: Payer: Self-pay | Admitting: Obstetrics and Gynecology

## 2015-02-22 DIAGNOSIS — N809 Endometriosis, unspecified: Secondary | ICD-10-CM | POA: Diagnosis not present

## 2015-02-22 DIAGNOSIS — N949 Unspecified condition associated with female genital organs and menstrual cycle: Secondary | ICD-10-CM | POA: Diagnosis not present

## 2015-02-22 MED ORDER — FLUCONAZOLE 150 MG PO TABS: 150.0000 mg | ORAL_TABLET | Freq: Once | ORAL | Status: DC

## 2015-02-22 NOTE — Telephone Encounter (Signed)
Diflucan 150mg  po x1 sent to pharmacy as requested by pt.

## 2015-02-22 NOTE — Telephone Encounter (Signed)
Patient called stating she has developed a yeast infection from taking her antibiotics. She uses the [facility] on [address].Thanks

## 2015-02-23 ENCOUNTER — Telehealth: Payer: Self-pay

## 2015-02-23 NOTE — Telephone Encounter (Signed)
Pt has started her Diflucan called in yesterday. Had called triage last night at 7:41pm due to her external vaginal area being raw/bleeding. See telephone advice record for 02/22/15. Was encouraged to go to urgent care or ER by the advice nurse. Pt did not go. Pt informed she could use Monistat cream externally, that this may be coming from her yeast infection. Pt states she is better today and is to see Dr. Valentino Saxonherry in a few days and will have her look at area.

## 2015-02-23 NOTE — Telephone Encounter (Signed)
See message below °

## 2015-02-24 ENCOUNTER — Ambulatory Visit: Payer: BLUE CROSS/BLUE SHIELD | Admitting: Obstetrics and Gynecology

## 2015-02-25 ENCOUNTER — Ambulatory Visit (INDEPENDENT_AMBULATORY_CARE_PROVIDER_SITE_OTHER): Payer: BLUE CROSS/BLUE SHIELD | Admitting: Obstetrics and Gynecology

## 2015-02-25 VITALS — BP 124/86 | HR 80 | Ht 65.0 in | Wt 210.4 lb

## 2015-02-25 DIAGNOSIS — Z9889 Other specified postprocedural states: Secondary | ICD-10-CM

## 2015-02-25 DIAGNOSIS — B3731 Acute candidiasis of vulva and vagina: Secondary | ICD-10-CM

## 2015-02-25 DIAGNOSIS — B373 Candidiasis of vulva and vagina: Secondary | ICD-10-CM

## 2015-02-25 MED ORDER — FLUCONAZOLE 150 MG PO TABS: 150.0000 mg | ORAL_TABLET | Freq: Once | ORAL | Status: DC

## 2015-02-27 NOTE — Progress Notes (Signed)
GYNECOLOGY POST-OPERATIVE VISIT  Subjective:     [name] is a 34 y.o. [id number] female who presents to the clinic 1 weeks status post laparoscopic left oophorectomy, bilateral salpingectomy, and lysis of adhesions for pelvic pain and opportunistic . Eating a regular diet without difficulty. Bowel movements are normal. The patient is not having any pain.  Has been compliant with antibiotics for recent strep throat.   The following portions of the patient's history were reviewed and updated as appropriate: allergies, current medications, past family history, past medical history, past social history, past surgical history and problem list.  Review of Systems A comprehensive review of systems was negative except for: Genitourinary: positive for vaginal discharge and itching.  Treated with Diflucan but still has some residual symptoms.    Objective:    BP 124/86 mmHg  Pulse 80  Ht 5\' 5"  (1.651 m)  Wt 210 lb 6.4 oz (95.437 kg)  BMI 35.01 kg/m2  LMP 10/15/2013 General:  alert and no distress  Abdomen: soft, bowel sounds active, non-tender  Incision:   healing well, no drainage, no erythema, no hernia, no seroma, no swelling, no dehiscence, incision well approximated        Pathology (02/15/2015):  A. FALLOPIAN TUBE AND OVARY, LEFT; SALPINGO-OOPHORECTOMY:  - OVARY WITH CYSTIC FOLLICLES.  - UNREMARKABLE FALLOPIAN TUBE.  - NEGATIVE FOR ATYPIA AND MALIGNANCY.  Assessment:    Doing well postoperatively.  Yeast candidiasis   Plan:    1. Continue any current medications as needed. 2. Wound care discussed. 3. Activity restrictions: no bending, stooping, or squatting and no lifting more than 15 pounds x 1 week. 4. Anticipated return to work: not applicable. 5. Refill given on Diflucan.  Advised on Vagisil for external symptoms.  6. Follow up: in 1 week for Depo Provera injection for endometriosis treatment.   Hildred LaserAnika Abir Craine, MD [facility]

## 2015-03-18 ENCOUNTER — Telehealth: Payer: Self-pay | Admitting: Obstetrics and Gynecology

## 2015-03-18 NOTE — Telephone Encounter (Signed)
PT CALLED AND HAD SURGERY BACK IN October, AND HER LEFT INCISON HAD SOME THREADING POKING OUT, AND IT HAS CLOSED UP AROUND IT AND ITS REALLY RED AND SWOLLEN AND TENDER, SHE WANTED A CALL BACK.

## 2015-03-18 NOTE — Telephone Encounter (Signed)
LM informing pt that sutures generally dissolve on their own, however if she feels like there is infection (redness, swelling, drainage) she should schedule an appt.

## 2015-03-19 ENCOUNTER — Emergency Department
Admission: EM | Admit: 2015-03-19 | Discharge: 2015-03-19 | Disposition: A | Discharge status: Home / Self Care | Payer: BLUE CROSS/BLUE SHIELD | Attending: Emergency Medicine | Admitting: Emergency Medicine

## 2015-03-19 ENCOUNTER — Emergency Department: Payer: BLUE CROSS/BLUE SHIELD

## 2015-03-19 ENCOUNTER — Encounter: Payer: Self-pay | Admitting: Emergency Medicine

## 2015-03-19 DIAGNOSIS — L03311 Cellulitis of abdominal wall: Secondary | ICD-10-CM | POA: Diagnosis not present

## 2015-03-19 DIAGNOSIS — T814XXA Infection following a procedure, initial encounter: Secondary | ICD-10-CM | POA: Insufficient documentation

## 2015-03-19 DIAGNOSIS — Z79899 Other long term (current) drug therapy: Secondary | ICD-10-CM | POA: Insufficient documentation

## 2015-03-19 DIAGNOSIS — Z880 Allergy status to penicillin: Secondary | ICD-10-CM | POA: Insufficient documentation

## 2015-03-19 DIAGNOSIS — I10 Essential (primary) hypertension: Secondary | ICD-10-CM | POA: Diagnosis not present

## 2015-03-19 DIAGNOSIS — Z90710 Acquired absence of both cervix and uterus: Secondary | ICD-10-CM | POA: Diagnosis not present

## 2015-03-19 DIAGNOSIS — Y658 Other specified misadventures during surgical and medical care: Secondary | ICD-10-CM | POA: Diagnosis not present

## 2015-03-19 DIAGNOSIS — Z792 Long term (current) use of antibiotics: Secondary | ICD-10-CM | POA: Diagnosis not present

## 2015-03-19 DIAGNOSIS — T8189XA Other complications of procedures, not elsewhere classified, initial encounter: Secondary | ICD-10-CM

## 2015-03-19 LAB — CBC WITH DIFFERENTIAL/PLATELET
Basophils Absolute: 0 10*3/uL (ref 0–0.1)
Basophils Relative: 1 %
Eosinophils Absolute: 0.1 10*3/uL (ref 0–0.7)
Eosinophils Relative: 2 %
HCT: 36.6 % (ref 35.0–47.0)
Hemoglobin: 12.6 g/dL (ref 12.0–16.0)
Lymphocytes Relative: 31 %
Lymphs Abs: 2.5 10*3/uL (ref 1.0–3.6)
MCH: 30.4 pg (ref 26.0–34.0)
MCHC: 34.5 g/dL (ref 32.0–36.0)
MCV: 88.2 fL (ref 80.0–100.0)
Monocytes Absolute: 0.4 10*3/uL (ref 0.2–0.9)
Monocytes Relative: 6 %
Neutro Abs: 4.9 10*3/uL (ref 1.4–6.5)
Neutrophils Relative %: 60 %
Platelets: 366 10*3/uL (ref 150–440)
RBC: 4.15 MIL/uL (ref 3.80–5.20)
RDW: 13.1 % (ref 11.5–14.5)
WBC: 8.1 10*3/uL (ref 3.6–11.0)

## 2015-03-19 LAB — URINALYSIS COMPLETE WITH MICROSCOPIC (ARMC ONLY)
Bilirubin Urine: NEGATIVE
Glucose, UA: NEGATIVE mg/dL
Hgb urine dipstick: NEGATIVE
Ketones, ur: NEGATIVE mg/dL
Leukocytes, UA: NEGATIVE
Nitrite: NEGATIVE
Protein, ur: NEGATIVE mg/dL
Specific Gravity, Urine: 1.017 (ref 1.005–1.030)
pH: 5 (ref 5.0–8.0)

## 2015-03-19 LAB — COMPREHENSIVE METABOLIC PANEL
ALT: 44 U/L (ref 14–54)
AST: 39 U/L (ref 15–41)
Albumin: 4.2 g/dL (ref 3.5–5.0)
Alkaline Phosphatase: 63 U/L (ref 38–126)
Anion gap: 6 (ref 5–15)
BUN: 10 mg/dL (ref 6–20)
CO2: 27 mmol/L (ref 22–32)
Calcium: 9.3 mg/dL (ref 8.9–10.3)
Chloride: 105 mmol/L (ref 101–111)
Creatinine, Ser: 0.72 mg/dL (ref 0.44–1.00)
GFR calc Af Amer: >60 mL/min (ref >60)
GFR calc non Af Amer: >60 mL/min (ref >60)
Glucose, Bld: 129 mg/dL — ABNORMAL HIGH (ref 65–99)
Potassium: 3.3 mmol/L — ABNORMAL LOW (ref 3.5–5.1)
Sodium: 138 mmol/L (ref 135–145)
Total Bilirubin: 0.4 mg/dL (ref 0.3–1.2)
Total Protein: 7.9 g/dL (ref 6.5–8.1)

## 2015-03-19 MED ORDER — CLINDAMYCIN HCL 150 MG PO CAPS
ORAL_CAPSULE | ORAL | Status: DC
Start: 2015-03-19 — End: 2015-03-20
  Filled 2015-03-19: qty 2

## 2015-03-19 MED ORDER — IOHEXOL 300 MG/ML  SOLN
100.0000 mL | Freq: Once | INTRAMUSCULAR | Status: AC | PRN
  Administered 2015-03-19: 100 mL via INTRAVENOUS
  Filled 2015-03-19: qty 100

## 2015-03-19 MED ORDER — KETOROLAC TROMETHAMINE 30 MG/ML IJ SOLN
30.0000 mg | Freq: Once | INTRAMUSCULAR | Status: AC
  Administered 2015-03-19: 30 mg via INTRAVENOUS
  Filled 2015-03-19: qty 1

## 2015-03-19 MED ORDER — IOHEXOL 240 MG/ML SOLN
25.0000 mL | Freq: Once | INTRAMUSCULAR | Status: AC | PRN
  Administered 2015-03-19: 25 mL via ORAL
  Filled 2015-03-19: qty 25

## 2015-03-19 MED ORDER — CLINDAMYCIN HCL 300 MG PO CAPS
300.0000 mg | ORAL_CAPSULE | Freq: Four times a day (QID) | ORAL | Status: DC
  Administered 2015-03-19: 300 mg via ORAL
  Filled 2015-03-19: qty 1

## 2015-03-19 MED ORDER — CLINDAMYCIN HCL 300 MG PO CAPS: 300.0000 mg | ORAL_CAPSULE | Freq: Four times a day (QID) | ORAL | Status: DC

## 2015-03-19 NOTE — Discharge Instructions (Signed)
Please take the entire course of antibiotics, even if you're feeling better. Please keep the wound area clean and dry. Do not soak or swim.  Please take Tylenol or Motrin for pain.  Please return to the emergency department if he develops severe pain, worsening redness around your wound, fever, chills, nausea or vomiting, or any other symptoms concerning to you.  Cellulitis Cellulitis is an infection of the skin and the tissue beneath it. The infected area is usually red and tender. Cellulitis occurs most often in the arms and lower legs.  CAUSES  Cellulitis is caused by bacteria that enter the skin through cracks or cuts in the skin. The most common types of bacteria that cause cellulitis are staphylococci and streptococci. SIGNS AND SYMPTOMS   Redness and warmth.  Swelling.  Tenderness or pain.  Fever. DIAGNOSIS  Your health care provider can usually determine what is wrong based on a physical exam. Blood tests may also be done. TREATMENT  Treatment usually involves taking an antibiotic medicine. HOME CARE INSTRUCTIONS   Take your antibiotic medicine as directed by your health care provider. Finish the antibiotic even if you start to feel better.  Keep the infected arm or leg elevated to reduce swelling.  Apply a warm cloth to the affected area up to 4 times per day to relieve pain.  Take medicines only as directed by your health care provider.  Keep all follow-up visits as directed by your health care provider. SEEK MEDICAL CARE IF:   You notice red streaks coming from the infected area.  Your red area gets larger or turns dark in color.  Your bone or joint underneath the infected area becomes painful after the skin has healed.  Your infection returns in the same area or another area.  You notice a swollen bump in the infected area.  You develop new symptoms.  You have a fever. SEEK IMMEDIATE MEDICAL CARE IF:   You feel very sleepy.  You develop vomiting or  diarrhea.  You have a general ill feeling (malaise) with muscle aches and pains.   This information is not intended to replace advice given to you by your health care provider. Make sure you discuss any questions you have with your health care provider.   Document Released: 01/11/2005 Document Revised: 12/23/2014 Document Reviewed: 06/19/2011 [employer] Interactive Patient Education [employer]

## 2015-03-19 NOTE — ED Provider Notes (Signed)
[facility] Emergency Department Provider Note  ____________________________________________  Time seen: Approximately 8:43 PM  I have reviewed the triage vital signs and the nursing notes.   HISTORY  Chief Complaint Post-op Problem    HPI Roena MaladyJennifer L Jorgensen is a 34 y.o. female status post left oophorectomy and bilateral salpingectomy on 1031 presenting with wound dehiscence and abdominal pain. Patient reports that she noticed some sutures "poking through" has abdominal skin in the left lower quadrant. She then developed some purulent and bloody discharge from the same area. She has been having some mild left lower quadrant pain. She denies any nausea, vomiting, fever, diarrhea.   Past Medical History  Diagnosis Date  . Hyperlipidemia     diet controlled, no meds  . Hypertension   . Pregnancy induced hypertension 2008  . Abnormal Pap smear   . Endometriosis   . Anxiety   . Anxiety   . PONV (postoperative nausea and vomiting)   . HA (headache)     otc med prn    Patient Active Problem List   Diagnosis Date Noted  . Pelvic pain in female 11/30/2014  . Endometriosis 11/30/2014  . Anxiety 09/01/2014  . Essential (primary) hypertension 09/01/2014  . Cyst of ovary 09/01/2014  . Class 2 obesity 09/01/2014  . Headache 07/22/2014  . Abdominal cramping 12/11/2013  . Anxiety state, unspecified 12/11/2013  . Routine general medical examination at a health care facility 04/06/2011  . Metabolic syndrome 04/06/2011  . SLEEP DISORDER 12/20/2007  . ALLERGIC RHINITIS 07/18/2007  . HYPERLIPIDEMIA 02/19/2007  . Essential hypertension, benign 02/19/2007    Past Surgical History  Procedure Laterality Date  . Tonsillectomy    . Tympanostomy tube placement    . Cesarean section  2008  . Laparoscopy  12/01/2011    Procedure: LAPAROSCOPY OPERATIVE;  Surgeon: Leslie AndreaJames E Tomblin II, MD;  Location: [facility];  Service: Gynecology;  Laterality: N/A;  . Esophageal biopsy   12/01/2011    Procedure: BIOPSY;  Surgeon: Leslie AndreaJames E Tomblin II, MD;  Location: [facility];  Service: Gynecology;  Laterality: N/A;  . Endometrial biopsy    . Colposcopy      as teenager, results WNL  . Cesarean section with bilateral tubal ligation N/A 12/02/2012    Procedure: Repeat Cesarean Section Delivery Baby Girl @ 1826, Apgars 8/9, Bilateral Tubal Ligation;  Surgeon: Leslie AndreaJames E Tomblin II, MD;  Location: [facility];  Service: Obstetrics;  Laterality: N/A;  . Abdominal hysterectomy  7/15    for endometriosis  . Tubal ligation    . Tonsillectomy    . Laparoscopy N/A 06/04/2014    Procedure: LAPAROSCOPY OPERATIVE;  Surgeon: Leslie AndreaJames E Tomblin II, MD;  Location: [facility];  Service: Gynecology;  Laterality: N/A;  . Laparoscopic lysis of adhesions N/A 06/04/2014    Procedure: LAPAROSCOPIC LYSIS OF ADHESIONS;  Surgeon: Leslie AndreaJames E Tomblin II, MD;  Location: [facility];  Service: Gynecology;  Laterality: N/A;  . Ablation on endometriosis N/A 06/04/2014    Procedure: ABLATION ON ENDOMETRIOSIS;  Surgeon: Leslie AndreaJames E Tomblin II, MD;  Location: [facility];  Service: Gynecology;  Laterality: N/A;  . Laparoscopic bilateral salpingectomy Bilateral 02/15/2015    Procedure: LAPAROSCOPIC BILATERAL SALPINGECTOMY;  Surgeon: Hildred LaserAnika Cherry, MD;  Location: [facility];  Service: Gynecology;  Laterality: Bilateral;  . Oophorectomy Left   . Salpingectomy Bilateral     Current Outpatient Rx  Name  Route  Sig  Dispense  Refill  . bisoprolol-hydrochlorothiazide (ZIAC) 10-6.25 MG per tablet   Oral  Take 1 tablet by mouth daily.          . cephALEXin (KEFLEX) 500 MG capsule   Oral   Take 1 capsule (500 mg total) by mouth 2 (two) times daily.   20 capsule   0   . clindamycin (CLEOCIN) 300 MG capsule   Oral   Take 1 capsule (300 mg total) by mouth every 6 (six) hours.   28 capsule   0   . clonazePAM (KLONOPIN) 0.5 MG tablet      take 1 tablet by mouth daily if needed for anxiety      0   . estradiol (ESTRACE) 1 MG tablet   Oral    Take 1 mg by mouth 2 (two) times daily.      0   . fluconazole (DIFLUCAN) 150 MG tablet   Oral   Take 1 tablet (150 mg total) by mouth once. May take repeat dose after 3 days if symptoms persist.   1 tablet   3   . losartan (COZAAR) 100 MG tablet   Oral   Take by mouth.         . medroxyPROGESTERone (DEPO-PROVERA) 150 MG/ML injection      inject intramuscularly every 3 months      0   . oxyCODONE-acetaminophen (PERCOCET/ROXICET) 5-325 MG tablet   Oral   Take 1-2 tablets by mouth every 6 (six) hours as needed for severe pain.   60 tablet   0   . pseudoephedrine-acetaminophen (TYLENOL SINUS) 30-500 MG TABS tablet   Oral   Take 1 tablet by mouth every 4 (four) hours as needed.         . traMADol (ULTRAM) 50 MG tablet   Oral   Take 1 tablet (50 mg total) by mouth every 6 (six) hours as needed.   60 tablet   2   . traMADol (ULTRAM) 50 MG tablet   Oral   Take 1 tablet (50 mg total) by mouth every 6 (six) hours as needed.   60 tablet   2   . zolpidem (AMBIEN) 5 MG tablet   Oral   Take 1 tablet (5 mg total) by mouth at bedtime as needed for sleep.   30 tablet   3     Allergies Amoxil; Azithromycin; Cefprozil; Ceftin; Other; Penicillins; and Sulfamethoxazole-trimethoprim  Family History  Problem Relation Age of Onset  . Hypertension Father   . Coronary artery disease Paternal Uncle   . Cancer Paternal Grandfather   . Diabetes Neg Hx     Social History Social History  Substance Use Topics  . Smoking status: Never Smoker   . Smokeless tobacco: Never Used  . Alcohol Use: Yes     Comment: occasional    Review of Systems Constitutional: No fever/chills. No lightheadedness or syncope. Eyes: No visual changes. ENT: No sore throat. Cardiovascular: Denies chest pain, palpitations. Respiratory: Denies shortness of breath.  No cough. Gastrointestinal: Positive left lower abdominal pain.  No nausea, no vomiting.  No diarrhea.  No  constipation. Genitourinary: Negative for dysuria. Musculoskeletal: Negative for back pain. Skin: Negative for rash. Positive wound dehiscence with purulent discharge Neurological: Negative for headaches, focal weakness or numbness.  10-point ROS otherwise negative.  ____________________________________________   PHYSICAL EXAM:  VITAL SIGNS: ED Triage Vitals  Enc Vitals Group     BP 03/19/15 1914 156/118 mmHg     Pulse Rate 03/19/15 1914 101     Resp 03/19/15 1914 20  Temp 03/19/15 1914 98.3 F (36.8 C)     Temp Source 03/19/15 1914 Oral     SpO2 03/19/15 1914 100 %     Weight 03/19/15 1914 210 lb (95.255 kg)     Height 03/19/15 1914  (1.651 m)     Head Cir --      Peak Flow --      Pain Score 03/19/15 1915 5     Pain Loc --      Pain Edu? --      Excl. in GC? --     Constitutional: Alert and oriented. Well appearing and in no acute distress. Answer question appropriately. Eyes: Conjunctivae are normal.  EOMI. Head: Atraumatic. Nose: No congestion/rhinnorhea. Mouth/Throat: Mucous membranes are moist.  Neck: No stridor.  Supple.   Cardiovascular: Normal rate, regular rhythm. No murmurs, rubs or gallops.  Respiratory: Normal respiratory effort.  No retractions. Lungs CTAB.  No wheezes, rales or ronchi. Gastrointestinal: Abdomen is obese, soft and nondistended. The patient has a 0.25 cm open wound in the left lower quadrant with approximate 1 cm of surrounding erythema that is tender to palpation and has a minimal amount of bloody and purulent discharge. She more diffusely has some tenderness in the left lower quadrant. No peritoneal signs, no guarding or rebound. Musculoskeletal: No LE edema.  Neurologic:  Normal speech and language. No gross focal neurologic deficits are appreciated.  Skin:  Skin is warm, dry and intact. No rash noted. Psychiatric: Mood and affect are normal. Speech and behavior are normal.  Normal  judgement.  ____________________________________________   LABS (all labs ordered are listed, but only abnormal results are displayed)  Labs Reviewed  COMPREHENSIVE METABOLIC PANEL - Abnormal; Notable for the following:    Potassium 3.3 (*)    Glucose, Bld 129 (*)    All other components within normal limits  URINALYSIS COMPLETEWITH MICROSCOPIC ([facility] ONLY) - Abnormal; Notable for the following:    Color, Urine YELLOW (*)    APPearance CLEAR (*)    Bacteria, UA RARE (*)    Squamous Epithelial / LPF 6-30 (*)    All other components within normal limits  CBC WITH DIFFERENTIAL/PLATELET   ____________________________________________  EKG  Not indicated ____________________________________________  RADIOLOGY  Ct Abdomen Pelvis W Contrast  03/19/2015  CLINICAL DATA:  Left lower quadrant pain. Reported history of left salpingo-oophorectomy on 02/15/2015. Prior hysterectomy. EXAM: CT ABDOMEN AND PELVIS WITH CONTRAST TECHNIQUE: Multidetector CT imaging of the abdomen and pelvis was performed using the standard protocol following bolus administration of intravenous contrast. CONTRAST:  OMNIPAQUE IOHEXOL 300 MG/ML  SOLN COMPARISON:  04/28/2014 CT abdomen/ pelvis. 08/21/2014 pelvic sonogram FINDINGS: Lower chest: No significant pulmonary nodules or acute consolidative airspace disease. Hepatobiliary: Normal liver with no liver mass. Normal gallbladder with no radiopaque cholelithiasis. No biliary ductal dilatation. Pancreas: Normal, with no mass or duct dilation. Spleen: Normal size. No mass. Adrenals/Urinary Tract: Normal adrenals. Normal kidneys with no hydronephrosis and no renal mass. Normal bladder. Stomach/Bowel: Grossly normal stomach. Normal caliber small bowel with no small bowel wall thickening. Normal appendix. Normal large bowel with no diverticulosis, large bowel wall thickening or pericolonic fat stranding. Vascular/Lymphatic: Normal caliber abdominal aorta. Patent portal,  splenic, hepatic and renal veins. No pathologically enlarged lymph nodes in the abdomen or pelvis. Reproductive: Status post hysterectomy, with no abnormal findings at the vaginal cuff. Right ovary appears normal. No adnexal mass or fluid collection. Other: No pneumoperitoneum, ascites or focal fluid collection. Musculoskeletal: No aggressive appearing  focal osseous lesions. Mild subcutaneous fat stranding in the ventral left lower abdominal wall, with no subcutaneous gas or superficial fluid collection. IMPRESSION: 1. Mild subcutaneous fat stranding in the ventral left lower abdominal wall, suggesting mild cellulitis. No subcutaneous gas or superficial fluid collection. 2. Status post hysterectomy, with no abnormal findings of the vaginal cuff. No adnexal mass or fluid collection. No pelvic ascites. Electronically Signed   By: Delbert Phenix M.D.   On: 03/19/2015 22:07    ____________________________________________   PROCEDURES  Procedure(s) performed: None  Critical Care performed: No ____________________________________________   INITIAL IMPRESSION / ASSESSMENT AND PLAN / ED COURSE  Pertinent labs & imaging results that were available during my care of the patient were reviewed by me and considered in my medical decision making (see chart for details).  34 y.o. female approximately one month status post left oophorectomy and bilateral salpingectomy presenting with wound in the left lower quadrant after seeing suture material. The most likely etiology is that the patient has had some erosion into her skin of suture material and has a localized infection. However, given that she has left lower quadrant tenderness I will get a CT scan to evaluate for any deeper signs of infection. At this time, the patient is hemodynamically stable and nontoxic appearing area to I will treat her pain and reevaluate her after the CT scan.  ----------------------------------------- 10:22 PM on  03/19/2015 -----------------------------------------  The patient's CT scan shows some localized cellulitis in the subcutaneous tissue but no extensive infection at this time. I will plan to discharge the patient home with clindamycin and instructions on how to keep her wound clean. I have made a call to [facility] to reach the OB/GYN on call so that they are aware that the patient was here this evening. Plan discharge  ____________________________________________  FINAL CLINICAL IMPRESSION(S) / ED DIAGNOSES  Final diagnoses:  Cellulitis of abdominal wall  Suture tract, initial encounter      NEW MEDICATIONS STARTED DURING THIS VISIT:  New Prescriptions   CLINDAMYCIN (CLEOCIN) 300 MG CAPSULE    Take 1 capsule (300 mg total) by mouth every 6 (six) hours.     Rockne Menghini, MD 03/19/15 2223

## 2015-03-19 NOTE — ED Notes (Addendum)
pt reports surgery on Oct 31st and states yesterday noticed one incision is changing color, painful and oozing blood and pus

## 2015-04-06 ENCOUNTER — Telehealth: Payer: Self-pay | Admitting: Internal Medicine

## 2015-04-06 ENCOUNTER — Encounter: Payer: Self-pay | Admitting: Internal Medicine

## 2015-04-06 MED ORDER — CIPROFLOXACIN HCL 250 MG PO TABS: 250.0000 mg | ORAL_TABLET | Freq: Two times a day (BID) | ORAL | Status: DC

## 2015-04-06 NOTE — Telephone Encounter (Signed)
I have not spoke to patient about an appointment, but is she needs one ok to schedule

## 2015-04-06 NOTE — Telephone Encounter (Signed)
Please call patient back about an appointment.

## 2015-04-06 NOTE — Telephone Encounter (Signed)
[facility]  All [facility] said was [facility].

## 2015-04-06 NOTE — Addendum Note (Signed)
Addended by: Sueanne MargaritaSMITH, DESHANNON L on: 04/06/2015 04:00 PM   Modules accepted: Orders

## 2015-04-06 NOTE — Telephone Encounter (Signed)
Please call her I would be okay with sending Rx for cipro 250mg  bid (#6 x 0) and then she would need to be seen if that didn't work No pharmacy given so speak with her about this

## 2015-05-07 ENCOUNTER — Other Ambulatory Visit: Payer: Self-pay

## 2015-05-07 DIAGNOSIS — G4700 Insomnia, unspecified: Secondary | ICD-10-CM

## 2015-05-07 MED ORDER — ZOLPIDEM TARTRATE 5 MG PO TABS: 5.0000 mg | ORAL_TABLET | Freq: Every evening | ORAL | Status: DC | PRN

## 2015-05-07 NOTE — Telephone Encounter (Signed)
RX for ambien refilled [employer] Dr.Cherry's verbal ok. Received refill request from [facility] [phone].

## 2015-05-14 ENCOUNTER — Ambulatory Visit
Admission: RE | Admit: 2015-05-14 | Discharge: 2015-05-14 | Disposition: A | Discharge status: Home / Self Care | Payer: BLUE CROSS/BLUE SHIELD | Source: Ambulatory Visit | Attending: Family Medicine | Admitting: Family Medicine

## 2015-05-14 ENCOUNTER — Ambulatory Visit
Admission: RE | Admit: 2015-05-14 | Discharge: 2015-05-14 | Disposition: A | Discharge status: Home / Self Care | Payer: BLUE CROSS/BLUE SHIELD | Source: Ambulatory Visit | Attending: Obstetrics and Gynecology | Admitting: Obstetrics and Gynecology

## 2015-05-14 ENCOUNTER — Other Ambulatory Visit: Payer: Self-pay | Admitting: Obstetrics and Gynecology

## 2015-05-14 ENCOUNTER — Other Ambulatory Visit: Payer: Self-pay | Admitting: Family Medicine

## 2015-05-14 DIAGNOSIS — S8992XA Unspecified injury of left lower leg, initial encounter: Secondary | ICD-10-CM | POA: Diagnosis not present

## 2015-05-14 DIAGNOSIS — S8990XA Unspecified injury of unspecified lower leg, initial encounter: Secondary | ICD-10-CM | POA: Diagnosis present

## 2015-05-14 DIAGNOSIS — X58XXXA Exposure to other specified factors, initial encounter: Secondary | ICD-10-CM | POA: Insufficient documentation

## 2015-05-14 DIAGNOSIS — M5442 Lumbago with sciatica, left side: Secondary | ICD-10-CM

## 2015-05-24 ENCOUNTER — Encounter: Payer: Self-pay | Admitting: Obstetrics and Gynecology

## 2015-05-26 ENCOUNTER — Telehealth: Payer: Self-pay | Admitting: Obstetrics and Gynecology

## 2015-05-26 MED ORDER — OXYCODONE-ACETAMINOPHEN 5-325 MG PO TABS: 1.0000 | ORAL_TABLET | Freq: Four times a day (QID) | ORAL | Status: DC | PRN

## 2015-05-26 NOTE — Telephone Encounter (Signed)
LM FOR PT LETTING HER KNOW °

## 2015-05-26 NOTE — Telephone Encounter (Signed)
I responded to her message earlier today.  She can come in for her prescription later today.  She can schedule an annual exam and we can give her the Depo injection on the same day.

## 2015-05-26 NOTE — Telephone Encounter (Signed)
Pt called and she had surgery back in October and she is having pain coming back from that, and she has tramidol from when you wrote it but she wanted to know if something else can be called in for harder days, and also she needed to know about the depo, to see if that will help but its time to schedule her AE and she wanted to know if she can get the depo and the AE on the same day. She stated she sent a MY Chart message on Monday nit sure if it went to you or [place].

## 2015-06-10 ENCOUNTER — Encounter: Payer: Self-pay | Admitting: Family Medicine

## 2015-06-10 ENCOUNTER — Ambulatory Visit (INDEPENDENT_AMBULATORY_CARE_PROVIDER_SITE_OTHER): Payer: BLUE CROSS/BLUE SHIELD | Admitting: Family Medicine

## 2015-06-10 VITALS — BP 150/100 | HR 96 | Temp 98.7°F | Ht 65.0 in | Wt 212.5 lb

## 2015-06-10 DIAGNOSIS — I10 Essential (primary) hypertension: Secondary | ICD-10-CM | POA: Diagnosis not present

## 2015-06-10 DIAGNOSIS — J069 Acute upper respiratory infection, unspecified: Secondary | ICD-10-CM

## 2015-06-10 DIAGNOSIS — B9789 Other viral agents as the cause of diseases classified elsewhere: Principal | ICD-10-CM

## 2015-06-10 MED ORDER — HYDROCODONE-HOMATROPINE 5-1.5 MG/5ML PO SYRP: 5.0000 mL | ORAL_SOLUTION | Freq: Every evening | ORAL | Status: DC | PRN

## 2015-06-10 NOTE — Progress Notes (Signed)
   Subjective:    Patient ID: [name], female    DOB: 1980/10/07, 35 y.o.   MRN: [id number]  Cough This is a new problem. The current episode started in the past 7 days (2 days ago). The problem has been gradually worsening. The problem occurs every few minutes. The cough is non-productive. Associated symptoms include ear congestion, ear pain, headaches, myalgias, nasal congestion, postnasal drip and a sore throat. Pertinent negatives include no fever or rash. Associated symptoms comments: Left side of throat and left ear sore  fatigue. The symptoms are aggravated by lying down. Risk factors: nonsmoker. Treatments tried: tylenol. The treatment provided mild relief.   HTN on losartan and bisoprolol HCTZ. No decongestant lately. BP Readings from Last 3 Encounters:  06/10/15 150/100  03/19/15 159/95  02/25/15 124/86    Social History /Family History/Past Medical History reviewed and updated if needed.   sick contact at work: flu  Review of Systems  Constitutional: Negative for fever.  HENT: Positive for ear pain, postnasal drip and sore throat.   Respiratory: Positive for cough.   Musculoskeletal: Positive for myalgias.  Skin: Negative for rash.  Neurological: Positive for headaches.       Objective:   Physical Exam  Constitutional: Vital signs are normal. She appears well-developed and well-nourished. She is cooperative.  Non-toxic appearance. She does not appear ill. No distress.  HENT:  Head: Normocephalic.  Right Ear: Hearing, tympanic membrane, external ear and ear canal normal. Tympanic membrane is not erythematous, not retracted and not bulging.  Left Ear: Hearing, tympanic membrane, external ear and ear canal normal. Tympanic membrane is not erythematous, not retracted and not bulging.  Nose: Mucosal edema and rhinorrhea present. Right sinus exhibits no maxillary sinus tenderness and no frontal sinus tenderness. Left sinus exhibits no maxillary sinus tenderness  and no frontal sinus tenderness.  Mouth/Throat: Uvula is midline and mucous membranes are normal. No posterior oropharyngeal erythema.  Eyes: Conjunctivae, EOM and lids are normal. Pupils are equal, round, and reactive to light. Lids are everted and swept, no foreign bodies found.  Neck: Trachea normal and normal range of motion. Neck supple. Carotid bruit is not present. No thyroid mass and no thyromegaly present.  Cardiovascular: Normal rate, regular rhythm, S1 normal, S2 normal, normal heart sounds, intact distal pulses and normal pulses.  Exam reveals no gallop and no friction rub.   No murmur heard. Pulmonary/Chest: Effort normal and breath sounds normal. No tachypnea. No respiratory distress. She has no decreased breath sounds. She has no wheezes. She has no rhonchi. She has no rales.  Neurological: She is alert.  Skin: Skin is warm, dry and intact. No rash noted.  Psychiatric: Her speech is normal and behavior is normal. Judgment normal. Her mood appears not anxious. Cognition and memory are normal. She does not exhibit a depressed mood.          Assessment & Plan:

## 2015-06-10 NOTE — Assessment & Plan Note (Signed)
Follow at home, call if above goal.

## 2015-06-10 NOTE — Patient Instructions (Addendum)
Follow  Blood pressure at home, call Dr. Alphonsus Sias if BP continue to be > 140/90.  Mucinex or mucinex DM ( no decongestant during the day.  Nasal saline irrigation or spray 2-3 times a day.  Hold Ambien.  Start cough suppressant at night.  Rest, fluids.

## 2015-06-10 NOTE — Assessment & Plan Note (Signed)
Symptomatic care 

## 2015-06-10 NOTE — Progress Notes (Signed)
Pre visit review using our clinic review tool, if applicable. No additional management support is needed unless otherwise documented below in the visit note. 

## 2015-06-14 ENCOUNTER — Encounter: Payer: Self-pay | Admitting: Family Medicine

## 2015-06-14 MED ORDER — HYDROCOD POLST-CPM POLST ER 10-8 MG/5ML PO SUER: 5.0000 mL | Freq: Every evening | ORAL | Status: DC | PRN

## 2015-06-28 ENCOUNTER — Telehealth: Payer: Self-pay | Admitting: Obstetrics and Gynecology

## 2015-06-28 ENCOUNTER — Other Ambulatory Visit: Payer: Self-pay | Admitting: Obstetrics and Gynecology

## 2015-06-28 NOTE — Telephone Encounter (Signed)
Pt called and she wanted to know if we had received the refill request for the tramadol from her pharmacy, i called you around 4:35 in afternoon but not sure if you had already left to check and see if you had it.

## 2015-06-29 ENCOUNTER — Encounter: Payer: Self-pay | Admitting: Obstetrics and Gynecology

## 2015-06-29 NOTE — Telephone Encounter (Signed)
RX should be up front, signed and ready for pt. Thanks

## 2015-06-30 ENCOUNTER — Encounter: Payer: Self-pay | Admitting: Obstetrics and Gynecology

## 2015-06-30 ENCOUNTER — Ambulatory Visit (INDEPENDENT_AMBULATORY_CARE_PROVIDER_SITE_OTHER): Payer: BLUE CROSS/BLUE SHIELD | Admitting: Obstetrics and Gynecology

## 2015-06-30 VITALS — BP 150/89 | HR 70 | Ht 65.0 in | Wt 215.8 lb

## 2015-06-30 DIAGNOSIS — E669 Obesity, unspecified: Secondary | ICD-10-CM

## 2015-06-30 DIAGNOSIS — N898 Other specified noninflammatory disorders of vagina: Secondary | ICD-10-CM | POA: Diagnosis not present

## 2015-06-30 DIAGNOSIS — N809 Endometriosis, unspecified: Secondary | ICD-10-CM

## 2015-06-30 DIAGNOSIS — Z01419 Encounter for gynecological examination (general) (routine) without abnormal findings: Secondary | ICD-10-CM

## 2015-06-30 DIAGNOSIS — N63 Unspecified lump in unspecified breast: Secondary | ICD-10-CM

## 2015-06-30 DIAGNOSIS — R030 Elevated blood-pressure reading, without diagnosis of hypertension: Secondary | ICD-10-CM | POA: Diagnosis not present

## 2015-06-30 DIAGNOSIS — IMO0001 Reserved for inherently not codable concepts without codable children: Secondary | ICD-10-CM

## 2015-06-30 NOTE — Progress Notes (Signed)
GYNECOLOGY ANNUAL PHYSICAL EXAM PROGRESS NOTE  Subjective:    [name] is a 35 y.o. G13P1102 married female with long time history of endometriosis who presents for an annual exam.  The patient is sexually active. The patient wears seatbelts: yes. The patient participates in regular exercise: no. Has the patient ever been transfused or tattooed?: no. The patient reports that there is not domestic violence in her life.  Current complaints today include: 1. Vaginal dryness, discomfort with intercourse. Questions whether or not this is due to the Depo Provera injections she currently receives.  2. Lump in left breast, noted while showering. Persistent, has been there for several months, mobile, no change in size.  Denies breast leakage.  Does note some intermittent breast tenderness radiating to axillary region.   Gynecologic History Menarche age: 71  Patient's last menstrual period was 10/15/2013. Contraception: status post hysterectomy History of STI's: Denies Last Pap: 05/2014. Results were: normal.  Notes h/o abnormal pap smears (2009 and 2010, LSIL/VAIN I).    Obstetric History   G2   P2   T1   P1   A0   TAB0   SAB0   E0   M0   L2     # Outcome Date GA Lbr Len/2nd Weight Sex Delivery Anes PTL Lv  2 Preterm 12/02/12 [redacted]w[redacted]d  6 lb 6.8 oz (2.915 kg) F CS-LTranv Spinal  Y     Name: Harn,GIRL Kea     Apgar1:  8                Apgar5: 9  1 Term 2008 [redacted]w[redacted]d  6 lb 14 oz (3.118 kg) F CS-LTranv Spinal N Y      Past Medical History  Diagnosis Date  . Hyperlipidemia     diet controlled, no meds  . Hypertension   . Pregnancy induced hypertension 2008  . Abnormal Pap smear   . Endometriosis   . Anxiety   . Anxiety   . PONV (postoperative nausea and vomiting)   . HA (headache)     otc med prn    Past Surgical History  Procedure Laterality Date  . Tonsillectomy    . Tympanostomy tube placement    . Cesarean section  2008  . Laparoscopy  12/01/2011    Procedure:  LAPAROSCOPY OPERATIVE;  Surgeon: Leslie Andrea, MD;  Location: [facility];  Service: Gynecology;  Laterality: N/A;  . Biopsy  12/01/2011    Procedure: BIOPSY;  Surgeon: Leslie Andrea, MD;  Location: [facility];  Service: Gynecology;  Laterality: N/A;  . Endometrial biopsy    . Colposcopy      as teenager, results WNL  . Cesarean section with bilateral tubal ligation N/A 12/02/2012    Procedure: Repeat Cesarean Section Delivery Baby Girl @ 1826, Apgars 8/9, Bilateral Tubal Ligation;  Surgeon: Leslie Andrea, MD;  Location: [facility];  Service: Obstetrics;  Laterality: N/A;  . Abdominal hysterectomy  7/15    for endometriosis  . Tubal ligation    . Tonsillectomy    . Laparoscopy N/A 06/04/2014    Procedure: LAPAROSCOPY OPERATIVE;  Surgeon: Leslie Andrea, MD;  Location: [facility];  Service: Gynecology;  Laterality: N/A;  . Laparoscopic lysis of adhesions N/A 06/04/2014    Procedure: LAPAROSCOPIC LYSIS OF ADHESIONS;  Surgeon: Leslie Andrea, MD;  Location: [facility];  Service: Gynecology;  Laterality: N/A;  . Ablation on endometriosis N/A 06/04/2014    Procedure:  ABLATION ON ENDOMETRIOSIS;  Surgeon: Leslie AndreaJames E Tomblin II, MD;  Location: [facility];  Service: Gynecology;  Laterality: N/A;  . Laparoscopic bilateral salpingectomy Bilateral 02/15/2015    Procedure: LAPAROSCOPIC BILATERAL SALPINGECTOMY;  Surgeon: Hildred LaserAnika Lemonte Al, MD;  Location: [facility];  Service: Gynecology;  Laterality: Bilateral;  . Oophorectomy Left   . Salpingectomy Bilateral     Family History  Problem Relation Age of Onset  . Hypertension Father   . Coronary artery disease Paternal Uncle   . Cancer Paternal Grandfather   . Diabetes Neg Hx     Social History   Social History  . Marital Status: Married    Spouse Name: N/A  . Number of Children: 1  . Years of Education: N/A   Occupational History  . [employer] officer [employer]    only once in a while  . 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 Smoking status: Never Smoker   . Smokeless tobacco: Never Used  . Alcohol Use: Yes     Comment: occasional  . Drug Use: No  . Sexual Activity: Yes    Birth Control/ Protection: Surgical, Injection   Other Topics Concern  . Not on file   Social History Narrative    Current Outpatient Prescriptions on File Prior to Visit  Medication Sig Dispense Refill  . bisoprolol-hydrochlorothiazide (ZIAC) 10-6.25 MG per tablet Take 1 tablet by mouth daily.     . chlorpheniramine-HYDROcodone (TUSSIONEX PENNKINETIC ER) 10-8 MG/5ML SUER Take 5 mLs by mouth at bedtime as needed for cough. 115 mL 0  . losartan (COZAAR) 100 MG tablet Take 100 mg by mouth daily.     Marland Kitchen. oxyCODONE-acetaminophen (PERCOCET/ROXICET) 5-325 MG tablet Take 1-2 tablets by mouth every 6 (six) hours as needed for severe pain. 60 tablet 0  . traMADol (ULTRAM) 50 MG tablet take 1 tablet by mouth every 6 hours if needed 60 tablet 3  . zolpidem (AMBIEN) 5 MG tablet Take 1 tablet (5 mg total) by mouth at bedtime as needed for sleep. 30 tablet 3   No current facility-administered medications on file prior to visit.    Allergies  Allergen Reactions  . Amoxil [Amoxicillin] Rash  . Azithromycin Rash  . Cefprozil Rash  . Ceftin [Cefuroxime Axetil] Rash  . Other Itching    CHG-wipes she had to use prier to surgery.  Ok to use CHG wash without problems  . Penicillins Rash  . Sulfamethoxazole-Trimethoprim Swelling and Rash    Review of Systems Constitutional: negative for chills, fatigue, fevers and sweats Eyes: negative for irritation, redness and visual disturbance Ears, nose, mouth, throat, and face: negative for hearing loss, nasal congestion, snoring and tinnitus Respiratory: negative for asthma, cough, sputum Cardiovascular: negative for chest pain, dyspnea, exertional chest pressure/discomfort, irregular heart beat, palpitations and syncope Gastrointestinal: negative for abdominal pain, change in bowel habits,  nausea and vomiting Genitourinary: negative for abnormal menstrual periods, genital lesions, sexual problems and vaginal discharge, dysuria and urinary incontinence Integument/breast: negative for breast lump, breast tenderness and nipple discharge Hematologic/lymphatic: negative for bleeding and easy bruising Musculoskeletal:negative for back pain and muscle weakness Neurological: negative for dizziness, headaches, vertigo and weakness Endocrine: negative for diabetic symptoms including polydipsia, polyuria and skin dryness Allergic/Immunologic: negative for hay fever and urticaria      Objective:  Blood pressure 150/89, pulse 70, height 5\' 5"  (1.651 m), weight 215 lb 12.8 oz (97.886 kg), last menstrual period 10/15/2013. Body mass index  is 35.91 kg/(m^2).  General Appearance:    Alert, cooperative, no distress, appears stated age, moderate obesity  Head:    Normocephalic, without obvious abnormality, atraumatic  Eyes:    PERRL, conjunctiva/corneas clear, EOM's intact, both eyes  Ears:    Normal external ear canals, both ears  Nose:   Nares normal, septum midline, mucosa normal, no drainage or sinus tenderness  Throat:   Lips, mucosa, and tongue normal; teeth and gums normal  Neck:   Supple, symmetrical, trachea midline, no adenopathy; thyroid: no enlargement/tenderness/nodules; no carotid bruit or JVD  Back:     Symmetric, no curvature, ROM normal, no CVA tenderness  Lungs:     Clear to auscultation bilaterally, respirations unlabored  Chest Wall:    No tenderness or deformity   Heart:    Regular rate and rhythm, S1 and S2 normal, no murmur, rub or gallop  Breast Exam:    No tenderness, or nipple abnormality.  Small pea sized nodule palpable in left breast at 9 o'clock, ~ 2 cm from areola, mobile, firm.   Abdomen:     Soft, non-tender, bowel sounds active all four quadrants, no masses, no organomegaly.    Genitalia:    Pelvic:external genitalia normal, vagina without lesions, or  tenderness, rectovaginal septum normal.  Scant white discharge present.  No adnexal masses or tenderness.  Uterus, cervix and left ovary surgically absent.   Rectal:    Normal external sphincter.  No hemorrhoids appreciated. Internal exam not done.   Extremities:   Extremities normal, atraumatic, no cyanosis or edema  Pulses:   2+ and symmetric all extremities  Skin:   Skin color, texture, turgor normal, no rashes or lesions  Lymph nodes:   Cervical, supraclavicular, and axillary nodes normal  Neurologic:   CNII-XII intact, normal strength, sensation and reflexes throughout    Labs:  Lab Results  Component Value Date   WBC 8.1 03/19/2015   HGB 12.6 03/19/2015   HCT 36.6 03/19/2015   MCV 88.2 03/19/2015   PLT 366 03/19/2015    Lab Results  Component Value Date   CREATININE 0.72 03/19/2015   BUN 10 03/19/2015   NA 138 03/19/2015   K 3.3* 03/19/2015   CL 105 03/19/2015   CO2 27 03/19/2015    Lab Results  Component Value Date   ALT 44 03/19/2015   AST 39 03/19/2015   ALKPHOS 63 03/19/2015   BILITOT 0.4 03/19/2015    Lab Results  Component Value Date   TSH 1.135 12/20/2007    Assessment:   Healthy female exam.   H/o endometriosis Vaginal dryness with intercourse Breast nodule Elevated BP Moderate obesity.    Plan:     Breast self exam technique reviewed and patient encouraged to perform self-exam monthly. Discussed healthy lifestyle modifications. Pap smear up to date.  H/o abnormal paps 6 years ago, normal since then. No further paps required as patient s/p hysterectomy and no h/o CIN II/III. Vaginal dryness with intercourse - advised on Joe's H20 (patient notes some sensitivity with KY products and certain soaps).  Not likely caused from Depo Provera injections (patient has only received 2 doses) however is not impossible.  Patient desires to continue the Depo Provera for management of endometriosis.  To schedule appointment next week for Depo injection. Refill  given yesterday for Tramadol for pain management.  Breast nodule palpable, small, mobile in left breast.  Recommend breast ultrasound. Will order.  Elevated BP - patient notes eating foods high in sodium over  the past day. Has no prior h/o HTN.  Advised on low-sodium diet.  Will schedule annual exam with PCP and have annual labs drawn there.  RTC in 1 year for next annual.     Hildred Laser, MD [facility]

## 2015-06-30 NOTE — Patient Instructions (Signed)
Health Maintenance, Female Adopting a healthy lifestyle and getting preventive care can go a long way to promote health and wellness. Talk with your health care provider about what schedule of regular examinations is right for you. This is a good chance for you to check in with your provider about disease prevention and staying healthy. In between checkups, there are plenty of things you can do on your own. Experts have done a lot of research about which lifestyle changes and preventive measures are most likely to keep you healthy. Ask your health care provider for more information. WEIGHT AND DIET  Eat a healthy diet  Be sure to include plenty of vegetables, fruits, low-fat dairy products, and lean protein.  Do not eat a lot of foods high in solid fats, added sugars, or salt.  Get regular exercise. This is one of the most important things you can do for your health.  Most adults should exercise for at least 150 minutes each week. The exercise should increase your heart rate and make you sweat (moderate-intensity exercise).  Most adults should also do strengthening exercises at least twice a week. This is in addition to the moderate-intensity exercise.  Maintain a healthy weight  Body mass index (BMI) is a measurement that can be used to identify possible weight problems. It estimates body fat based on height and weight. Your health care provider can help determine your BMI and help you achieve or maintain a healthy weight.  For females 20 years of age and older:   A BMI below 18.5 is considered underweight.  A BMI of 18.5 to 24.9 is normal.  A BMI of 25 to 29.9 is considered overweight.  A BMI of 30 and above is considered obese.  Watch levels of cholesterol and blood lipids  You should start having your blood tested for lipids and cholesterol at 34 years of age, then have this test every 5 years.  You may need to have your cholesterol levels checked more often if:  Your lipid  or cholesterol levels are high.  You are older than 34 years of age.  You are at high risk for heart disease.  CANCER SCREENING   Lung Cancer  Lung cancer screening is recommended for adults 55-80 years old who are at high risk for lung cancer because of a history of smoking.  A yearly low-dose CT scan of the lungs is recommended for people who:  Currently smoke.  Have quit within the past 15 years.  Have at least a 30-pack-year history of smoking. A pack year is smoking an average of one pack of cigarettes a day for 1 year.  Yearly screening should continue until it has been 15 years since you quit.  Yearly screening should stop if you develop a health problem that would prevent you from having lung cancer treatment.  Breast Cancer  Practice breast self-awareness. This means understanding how your breasts normally appear and feel.  It also means doing regular breast self-exams. Let your health care provider know about any changes, no matter how small.  If you are in your 20s or 30s, you should have a clinical breast exam (CBE) by a health care provider every 1-3 years as part of a regular health exam.  If you are 40 or older, have a CBE every year. Also consider having a breast X-ray (mammogram) every year.  If you have a family history of breast cancer, talk to your health care provider about genetic screening.  If you   are at high risk for breast cancer, talk to your health care provider about having an MRI and a mammogram every year.  Breast cancer gene (BRCA) assessment is recommended for women who have family members with BRCA-related cancers. BRCA-related cancers include:  Breast.  Ovarian.  Tubal.  Peritoneal cancers.  Results of the assessment will determine the need for genetic counseling and BRCA1 and BRCA2 testing. Cervical Cancer Your health care provider may recommend that you be screened regularly for cancer of the pelvic organs (ovaries, uterus, and  vagina). This screening involves a pelvic examination, including checking for microscopic changes to the surface of your cervix (Pap test). You may be encouraged to have this screening done every 3 years, beginning at age 21.  For women ages 30-65, health care providers may recommend pelvic exams and Pap testing every 3 years, or they may recommend the Pap and pelvic exam, combined with testing for human papilloma virus (HPV), every 5 years. Some types of HPV increase your risk of cervical cancer. Testing for HPV may also be done on women of any age with unclear Pap test results.  Other health care providers may not recommend any screening for nonpregnant women who are considered low risk for pelvic cancer and who do not have symptoms. Ask your health care provider if a screening pelvic exam is right for you.  If you have had past treatment for cervical cancer or a condition that could lead to cancer, you need Pap tests and screening for cancer for at least 20 years after your treatment. If Pap tests have been discontinued, your risk factors (such as having a new sexual partner) need to be reassessed to determine if screening should resume. Some women have medical problems that increase the chance of getting cervical cancer. In these cases, your health care provider may recommend more frequent screening and Pap tests. Colorectal Cancer  This type of cancer can be detected and often prevented.  Routine colorectal cancer screening usually begins at 34 years of age and continues through 34 years of age.  Your health care provider may recommend screening at an earlier age if you have risk factors for colon cancer.  Your health care provider may also recommend using home test kits to check for hidden blood in the stool.  A small camera at the end of a tube can be used to examine your colon directly (sigmoidoscopy or colonoscopy). This is done to check for the earliest forms of colorectal  cancer.  Routine screening usually begins at age 50.  Direct examination of the colon should be repeated every 5-10 years through 34 years of age. However, you may need to be screened more often if early forms of precancerous polyps or small growths are found. Skin Cancer  Check your skin from head to toe regularly.  Tell your health care provider about any new moles or changes in moles, especially if there is a change in a mole's shape or color.  Also tell your health care provider if you have a mole that is larger than the size of a pencil eraser.  Always use sunscreen. Apply sunscreen liberally and repeatedly throughout the day.  Protect yourself by wearing long sleeves, pants, a wide-brimmed hat, and sunglasses whenever you are outside. HEART DISEASE, DIABETES, AND HIGH BLOOD PRESSURE   High blood pressure causes heart disease and increases the risk of stroke. High blood pressure is more likely to develop in:  People who have blood pressure in the high end   of the normal range (130-139/85-89 mm Hg).  People who are overweight or obese.  People who are African American.  If you are 38-23 years of age, have your blood pressure checked every 3-5 years. If you are 61 years of age or older, have your blood pressure checked every year. You should have your blood pressure measured twice--once when you are at a hospital or clinic, and once when you are not at a hospital or clinic. Record the average of the two measurements. To check your blood pressure when you are not at a hospital or clinic, you can use:  An automated blood pressure machine at a pharmacy.  A home blood pressure monitor.  If you are between 45 years and 39 years old, ask your health care provider if you should take aspirin to prevent strokes.  Have regular diabetes screenings. This involves taking a blood sample to check your fasting blood sugar level.  If you are at a normal weight and have a low risk for diabetes,  have this test once every three years after 34 years of age.  If you are overweight and have a high risk for diabetes, consider being tested at a younger age or more often. PREVENTING INFECTION  Hepatitis B  If you have a higher risk for hepatitis B, you should be screened for this virus. You are considered at high risk for hepatitis B if:  You were born in a country where hepatitis B is common. Ask your health care provider which countries are considered high risk.  Your parents were born in a high-risk country, and you have not been immunized against hepatitis B (hepatitis B vaccine).  You have HIV or AIDS.  You use needles to inject street drugs.  You live with someone who has hepatitis B.  You have had sex with someone who has hepatitis B.  You get hemodialysis treatment.  You take certain medicines for conditions, including cancer, organ transplantation, and autoimmune conditions. Hepatitis C  Blood testing is recommended for:  Everyone born from [birth year] through 1964.  Anyone with known risk factors for hepatitis C. Sexually transmitted infections (STIs)  You should be screened for sexually transmitted infections (STIs) including gonorrhea and chlamydia if:  You are sexually active and are younger than 34 years of age.  You are older than 34 years of age and your health care provider tells you that you are at risk for this type of infection.  Your sexual activity has changed since you were last screened and you are at an increased risk for chlamydia or gonorrhea. Ask your health care provider if you are at risk.  If you do not have HIV, but are at risk, it may be recommended that you take a prescription medicine daily to prevent HIV infection. This is called pre-exposure prophylaxis (PrEP). You are considered at risk if:  You are sexually active and do not regularly use condoms or know the HIV status of your partner(s).  You take drugs by injection.  You are sexually  active with a partner who has HIV. Talk with your health care provider about whether you are at high risk of being infected with HIV. If you choose to begin PrEP, you should first be tested for HIV. You should then be tested every 3 months for as long as you are taking PrEP.  PREGNANCY   If you are premenopausal and you may become pregnant, ask your health care provider about preconception counseling.  If you may  become pregnant, take 400 to 800 micrograms (mcg) of folic acid every day.  If you want to prevent pregnancy, talk to your health care provider about birth control (contraception). OSTEOPOROSIS AND MENOPAUSE   Osteoporosis is a disease in which the bones lose minerals and strength with aging. This can result in serious bone fractures. Your risk for osteoporosis can be identified using a bone density scan.  If you are 61 years of age or older, or if you are at risk for osteoporosis and fractures, ask your health care provider if you should be screened.  Ask your health care provider whether you should take a calcium or vitamin D supplement to lower your risk for osteoporosis.  Menopause may have certain physical symptoms and risks.  Hormone replacement therapy may reduce some of these symptoms and risks. Talk to your health care provider about whether hormone replacement therapy is right for you.  HOME CARE INSTRUCTIONS   Schedule regular health, dental, and eye exams.  Stay current with your immunizations.   Do not use any tobacco products including cigarettes, chewing tobacco, or electronic cigarettes.  If you are pregnant, do not drink alcohol.  If you are breastfeeding, limit how much and how often you drink alcohol.  Limit alcohol intake to no more than 1 drink per day for nonpregnant women. One drink equals 12 ounces of beer, 5 ounces of wine, or 1 ounces of hard liquor.  Do not use street drugs.  Do not share needles.  Ask your health care provider for help if  you need support or information about quitting drugs.  Tell your health care provider if you often feel depressed.  Tell your health care provider if you have ever been abused or do not feel safe at home.   This information is not intended to replace advice given to you by your health care provider. Make sure you discuss any questions you have with your health care provider.   Document Released: 10/17/2010 Document Revised: 04/24/2014 Document Reviewed: 03/05/2013 Elsevier Interactive Patient Education [employer].

## 2015-07-01 NOTE — Addendum Note (Signed)
Addended by: Frankey ShownGLANTON, Winnell Bento C on: 07/01/2015 04:45 PM   Modules accepted: Orders

## 2015-07-09 ENCOUNTER — Ambulatory Visit: Payer: BLUE CROSS/BLUE SHIELD

## 2015-07-19 ENCOUNTER — Ambulatory Visit
Admission: RE | Admit: 2015-07-19 | Discharge: 2015-07-19 | Disposition: A | Discharge status: Home / Self Care | Payer: BLUE CROSS/BLUE SHIELD | Source: Ambulatory Visit | Attending: Obstetrics and Gynecology | Admitting: Obstetrics and Gynecology

## 2015-07-19 ENCOUNTER — Other Ambulatory Visit: Payer: Self-pay | Admitting: Obstetrics and Gynecology

## 2015-07-19 DIAGNOSIS — N63 Unspecified lump in unspecified breast: Secondary | ICD-10-CM

## 2015-08-04 ENCOUNTER — Encounter: Payer: Self-pay | Admitting: Obstetrics and Gynecology

## 2015-08-05 ENCOUNTER — Ambulatory Visit (INDEPENDENT_AMBULATORY_CARE_PROVIDER_SITE_OTHER): Payer: BLUE CROSS/BLUE SHIELD | Admitting: Obstetrics and Gynecology

## 2015-08-05 VITALS — BP 151/113 | HR 84 | Wt 213.0 lb

## 2015-08-05 DIAGNOSIS — N809 Endometriosis, unspecified: Secondary | ICD-10-CM | POA: Diagnosis not present

## 2015-08-05 MED ORDER — MEDROXYPROGESTERONE ACETATE 150 MG/ML IM SUSP
150.0000 mg | Freq: Once | INTRAMUSCULAR | Status: AC
  Administered 2015-08-05: 150 mg via INTRAMUSCULAR

## 2015-08-05 MED ORDER — OXYCODONE-ACETAMINOPHEN 5-325 MG PO TABS: 1.0000 | ORAL_TABLET | Freq: Four times a day (QID) | ORAL | Status: DC | PRN

## 2015-08-05 NOTE — Progress Notes (Signed)
Patient ID: Terri MaladyJennifer L Munoz, female   DOB: November 20, 1980, 35 y.o.   MRN: [id number] Pt presents for Depo-provera injection for endometriosis. Having some pain on right side. Also pt states she is having vaginal dryness during intercourse, sometimes bleeding from being so dry. Could not remember what Dr. Valentino Saxonherry had suggested. Gave some examples of what she could use such as JoH2O, astroglide, and [place]Luvena. Pt B/P elevated today but just had her step son admitted to a drug rehab center.

## 2015-08-19 ENCOUNTER — Encounter: Payer: Self-pay | Admitting: Obstetrics and Gynecology

## 2015-08-20 ENCOUNTER — Encounter: Payer: Self-pay | Admitting: Internal Medicine

## 2015-08-20 ENCOUNTER — Ambulatory Visit (INDEPENDENT_AMBULATORY_CARE_PROVIDER_SITE_OTHER): Payer: BLUE CROSS/BLUE SHIELD | Admitting: Internal Medicine

## 2015-08-20 VITALS — BP 142/102 | HR 78 | Temp 98.2°F | Wt 211.0 lb

## 2015-08-20 DIAGNOSIS — I10 Essential (primary) hypertension: Secondary | ICD-10-CM | POA: Diagnosis not present

## 2015-08-20 DIAGNOSIS — J069 Acute upper respiratory infection, unspecified: Secondary | ICD-10-CM | POA: Diagnosis not present

## 2015-08-20 LAB — CBC WITH DIFFERENTIAL/PLATELET
Basophils Absolute: 0 10*3/uL (ref 0.0–0.1)
Basophils Relative: 0.3 % (ref 0.0–3.0)
Eosinophils Absolute: 0.1 10*3/uL (ref 0.0–0.7)
Eosinophils Relative: 1.3 % (ref 0.0–5.0)
HCT: 42.6 % (ref 36.0–46.0)
Hemoglobin: 14.5 g/dL (ref 12.0–15.0)
Lymphocytes Relative: 26.1 % (ref 12.0–46.0)
Lymphs Abs: 2.2 10*3/uL (ref 0.7–4.0)
MCHC: 34 g/dL (ref 30.0–36.0)
MCV: 88.1 fl (ref 78.0–100.0)
Monocytes Absolute: 0.5 10*3/uL (ref 0.1–1.0)
Monocytes Relative: 6 % (ref 3.0–12.0)
Neutro Abs: 5.5 10*3/uL (ref 1.4–7.7)
Neutrophils Relative %: 66.3 % (ref 43.0–77.0)
Platelets: 405 10*3/uL — ABNORMAL HIGH (ref 150.0–400.0)
RBC: 4.83 Mil/uL (ref 3.87–5.11)
RDW: 13 % (ref 11.5–15.5)
WBC: 8.3 10*3/uL (ref 4.0–10.5)

## 2015-08-20 LAB — COMPREHENSIVE METABOLIC PANEL
ALT: 19 U/L (ref 0–35)
AST: 17 U/L (ref 0–37)
Albumin: 5 g/dL (ref 3.5–5.2)
Alkaline Phosphatase: 42 U/L (ref 39–117)
BUN: 12 mg/dL (ref 6–23)
CO2: 22 mEq/L (ref 19–32)
Calcium: 10.5 mg/dL (ref 8.4–10.5)
Chloride: 103 mEq/L (ref 96–112)
Creatinine, Ser: 0.78 mg/dL (ref 0.40–1.20)
GFR: 90.09 mL/min (ref >60.00)
Glucose, Bld: 117 mg/dL — ABNORMAL HIGH (ref 70–99)
Potassium: 4 mEq/L (ref 3.5–5.1)
Sodium: 137 mEq/L (ref 135–145)
Total Bilirubin: 0.8 mg/dL (ref 0.2–1.2)
Total Protein: 8.6 g/dL — ABNORMAL HIGH (ref 6.0–8.3)

## 2015-08-20 LAB — MICROALBUMIN / CREATININE URINE RATIO
Creatinine,U: 55.7 mg/dL
Microalb Creat Ratio: 3.4 mg/g (ref 0.0–30.0)
Microalb, Ur: 1.9 mg/dL (ref 0.0–1.9)

## 2015-08-20 LAB — LIPID PANEL
Cholesterol: 253 mg/dL — ABNORMAL HIGH (ref 0–200)
HDL: 47.1 mg/dL (ref >39.00)
NonHDL: 205.83
Total CHOL/HDL Ratio: 5
Triglycerides: 270 mg/dL — ABNORMAL HIGH (ref 0.0–149.0)
VLDL: 54 mg/dL — ABNORMAL HIGH (ref 0.0–40.0)

## 2015-08-20 LAB — LDL CHOLESTEROL, DIRECT: Direct LDL: 162 mg/dL

## 2015-08-20 LAB — T4, FREE: Free T4: 0.49 ng/dL — ABNORMAL LOW (ref 0.60–1.60)

## 2015-08-20 MED ORDER — GUAIFENESIN-CODEINE 100-10 MG/5ML PO SYRP: 5.0000 mL | ORAL_SOLUTION | Freq: Every evening | ORAL | Status: DC | PRN

## 2015-08-20 MED ORDER — LOSARTAN POTASSIUM-HCTZ 100-25 MG PO TABS
1.0000 | ORAL_TABLET | Freq: Every day | ORAL | Status: DC
Start: 2015-08-20 — End: 2017-01-10

## 2015-08-20 NOTE — Telephone Encounter (Signed)
I will forward to Dr Ermalene SearingBedsole in Dr Karle StarchLetvak's absence.

## 2015-08-20 NOTE — Addendum Note (Signed)
Addended by: Kerby NoraBEDSOLE, Lavena Loretto E on: 08/20/2015 05:19 PM   Modules accepted: Orders

## 2015-08-20 NOTE — Patient Instructions (Signed)
Let me know if your cold is worsening towards the middle or end of next week.

## 2015-08-20 NOTE — Assessment & Plan Note (Signed)
Clearly seems viral Discussed supportive care She will call if worsens into next week

## 2015-08-20 NOTE — Telephone Encounter (Signed)
Robitussin AC called into [facility] [address] [place]

## 2015-08-20 NOTE — Progress Notes (Signed)
Subjective:    Patient ID: [name], female    DOB: 18-Nov-1980, 35 y.o.   MRN: [id number]  HPI Here due to elevated BP and respiratory symptoms Running high at her gyn visits Then had a few days of bad headache--- checked BP then and it was 162/121 (her machine) Headache resolved and then BP down to 150/100 or so Consistent with her meds  Strong FH of difficult elevated BP MI and stroke also  No chest pain of note---just slight tired feeling in chest No SOB No dizziness or syncope No edema  Some rhinorrhea and cough for 2 days No fever No breathing problems Benzonatate helped some--but still affected sleep last night Worse after mowing the lawn  She left for 4 days due to ongoing marriage problems Thinks better since she went back He has agreed to go to church with her--but hasn't been able to start counseling (hopes to do with pastor) He is working a lot of hours--trying to be home more  Current Outpatient Prescriptions on File Prior to Visit  Medication Sig Dispense Refill  . bisoprolol-hydrochlorothiazide (ZIAC) 10-6.25 MG per tablet Take 1 tablet by mouth daily.     Marland Kitchen losartan (COZAAR) 100 MG tablet Take 100 mg by mouth daily.     Marland Kitchen oxyCODONE-acetaminophen (PERCOCET/ROXICET) 5-325 MG tablet Take 1-2 tablets by mouth every 6 (six) hours as needed for severe pain. 60 tablet 0  . traMADol (ULTRAM) 50 MG tablet take 1 tablet by mouth every 6 hours if needed 60 tablet 3  . zolpidem (AMBIEN) 5 MG tablet Take 1 tablet (5 mg total) by mouth at bedtime as needed for sleep. 30 tablet 3   No current facility-administered medications on file prior to visit.    Allergies  Allergen Reactions  . Amoxil [Amoxicillin] Rash  . Azithromycin Rash  . Cefprozil Rash  . Ceftin [Cefuroxime Axetil] Rash  . Other Itching    CHG-wipes she had to use prier to surgery.  Ok to use CHG wash without problems  . Penicillins Rash  . Sulfamethoxazole-Trimethoprim Swelling and Rash     Past Medical History  Diagnosis Date  . Hyperlipidemia     diet controlled, no meds  . Hypertension   . Pregnancy induced hypertension 2008  . Abnormal Pap smear   . Endometriosis   . Anxiety   . Anxiety   . PONV (postoperative nausea and vomiting)   . HA (headache)     otc med prn    Past Surgical History  Procedure Laterality Date  . Tonsillectomy    . Tympanostomy tube placement    . Cesarean section  2008  . Laparoscopy  12/01/2011    Procedure: LAPAROSCOPY OPERATIVE;  Surgeon: Leslie Andrea, MD;  Location: [facility];  Service: Gynecology;  Laterality: N/A;  . Biopsy  12/01/2011    Procedure: BIOPSY;  Surgeon: Leslie Andrea, MD;  Location: [facility];  Service: Gynecology;  Laterality: N/A;  . Endometrial biopsy    . Colposcopy      as teenager, results WNL  . Cesarean section with bilateral tubal ligation N/A 12/02/2012    Procedure: Repeat Cesarean Section Delivery Baby Girl @ 1826, Apgars 8/9, Bilateral Tubal Ligation;  Surgeon: Leslie Andrea, MD;  Location: [facility];  Service: Obstetrics;  Laterality: N/A;  . Abdominal hysterectomy  7/15    for endometriosis  . Tubal ligation    . Tonsillectomy    . Laparoscopy N/A 06/04/2014  Procedure: LAPAROSCOPY OPERATIVE;  Surgeon: Leslie AndreaJames E Tomblin II, MD;  Location: [facility];  Service: Gynecology;  Laterality: N/A;  . Laparoscopic lysis of adhesions N/A 06/04/2014    Procedure: LAPAROSCOPIC LYSIS OF ADHESIONS;  Surgeon: Leslie AndreaJames E Tomblin II, MD;  Location: [facility];  Service: Gynecology;  Laterality: N/A;  . Ablation on endometriosis N/A 06/04/2014    Procedure: ABLATION ON ENDOMETRIOSIS;  Surgeon: Leslie AndreaJames E Tomblin II, MD;  Location: [facility];  Service: Gynecology;  Laterality: N/A;  . Laparoscopic bilateral salpingectomy Bilateral 02/15/2015    Procedure: LAPAROSCOPIC BILATERAL SALPINGECTOMY;  Surgeon: Hildred LaserAnika Cherry, MD;  Location: [facility];  Service: Gynecology;  Laterality: Bilateral;  . Oophorectomy Left   . Salpingectomy  Bilateral     Family History  Problem Relation Age of Onset  . Hypertension Father   . Coronary artery disease Paternal Uncle   . Cancer Paternal Grandfather   . Diabetes Neg Hx     Social History   Social History  . Marital Status: Married    Spouse Name: N/A  . Number of Children: 1  . Years of Education: N/A   Occupational History  . 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 Smoking status: Never Smoker   . Smokeless tobacco: Never Used  . Alcohol Use: Yes     Comment: occasional  . Drug Use: No  . Sexual Activity: Yes    Birth Control/ Protection: Surgical, Injection   Other Topics Concern  . Not on file   Social History Narrative   Review of Systems Weight up and down--fairly stable Not doing exercise Some hot flashes on the depoprovera---has ambien for sleep from gyn Stress with stepson--drugs, jail due to repeated driving infractions, committed briefly    Objective:   Physical Exam  Constitutional: She appears well-developed and well-nourished. No distress.  HENT:  No sinus tenderness TMs normal Moderate nasal inflammation Slight pharyngeal injection  Neck: Normal range of motion. Neck supple. No thyromegaly present.  Cardiovascular: Normal rate, regular rhythm and normal heart sounds.  Exam reveals no gallop.   No murmur heard. Pulmonary/Chest: Effort normal and breath sounds normal. No respiratory distress. She has no wheezes. She has no rales.  Musculoskeletal: She exhibits no edema or tenderness.  Lymphadenopathy:    She has no cervical adenopathy.  Psychiatric: She has a normal mood and affect. Her behavior is normal.          Assessment & Plan:

## 2015-08-20 NOTE — Progress Notes (Signed)
Pre visit review using our clinic review tool, if applicable. No additional management support is needed unless otherwise documented below in the visit note. 

## 2015-08-20 NOTE — Assessment & Plan Note (Signed)
BP Readings from Last 3 Encounters:  08/20/15 142/102  08/05/15 151/113  06/30/15 150/89   Will change losartan to HCTZ---low dose of HCTZ in ziac may not be enough to overcome compensatory sodium retention If still high, would then add CCB Check labs today

## 2015-08-21 ENCOUNTER — Other Ambulatory Visit: Payer: Self-pay | Admitting: Internal Medicine

## 2015-08-21 ENCOUNTER — Encounter: Payer: Self-pay | Admitting: Internal Medicine

## 2015-08-21 DIAGNOSIS — R946 Abnormal results of thyroid function studies: Secondary | ICD-10-CM

## 2015-08-23 ENCOUNTER — Encounter: Payer: Self-pay | Admitting: Internal Medicine

## 2015-08-24 ENCOUNTER — Encounter: Payer: Self-pay | Admitting: Obstetrics and Gynecology

## 2015-08-24 ENCOUNTER — Other Ambulatory Visit: Payer: Self-pay | Admitting: Obstetrics and Gynecology

## 2015-08-25 ENCOUNTER — Telehealth: Payer: Self-pay | Admitting: *Deleted

## 2015-08-25 ENCOUNTER — Other Ambulatory Visit (INDEPENDENT_AMBULATORY_CARE_PROVIDER_SITE_OTHER): Payer: BLUE CROSS/BLUE SHIELD

## 2015-08-25 DIAGNOSIS — R946 Abnormal results of thyroid function studies: Secondary | ICD-10-CM

## 2015-08-25 LAB — T4, FREE: Free T4: 0.67 ng/dL (ref 0.60–1.60)

## 2015-08-25 LAB — TSH: TSH: 1.71 u[IU]/mL (ref 0.35–4.50)

## 2015-08-25 MED ORDER — ESTRADIOL 1 MG PO TABS: 1.0000 mg | ORAL_TABLET | Freq: Every day | ORAL | Status: DC

## 2015-08-25 NOTE — Telephone Encounter (Signed)
The pt wants to try the add back therapy, can you please send something in. Thanks

## 2015-08-25 NOTE — Telephone Encounter (Signed)
Ok.  Sent in prescription to pharmacy.

## 2015-08-25 NOTE — Telephone Encounter (Signed)
Patient called and stated that she talked to Dr. Valentino Saxonherry about trying estrogen . Patient stated that is something she wants to try. [facility]. Thanks

## 2015-09-14 ENCOUNTER — Encounter: Payer: Self-pay | Admitting: Obstetrics and Gynecology

## 2015-09-15 ENCOUNTER — Telehealth: Payer: Self-pay | Admitting: Obstetrics and Gynecology

## 2015-09-15 ENCOUNTER — Encounter: Payer: Self-pay | Admitting: Obstetrics and Gynecology

## 2015-09-15 NOTE — Telephone Encounter (Signed)
This pt is in pain left side. She is asking for percocet refill. She don't want to go to ER

## 2015-09-16 MED ORDER — OXYCODONE-ACETAMINOPHEN 5-325 MG PO TABS: 1.0000 | ORAL_TABLET | Freq: Four times a day (QID) | ORAL | Status: DC | PRN

## 2015-09-16 NOTE — Telephone Encounter (Signed)
Please advise 

## 2015-09-16 NOTE — Telephone Encounter (Signed)
Sorry.  Holley DexterMeant to reply to her Mychart message yesterday.  Her prescription is available at the front desk.

## 2015-09-22 ENCOUNTER — Other Ambulatory Visit: Payer: Self-pay | Admitting: Obstetrics and Gynecology

## 2015-10-01 ENCOUNTER — Encounter: Payer: Self-pay | Admitting: Internal Medicine

## 2015-10-04 MED ORDER — BISOPROLOL-HYDROCHLOROTHIAZIDE 10-6.25 MG PO TABS: 1.0000 | ORAL_TABLET | Freq: Every day | ORAL | Status: DC

## 2015-10-07 ENCOUNTER — Ambulatory Visit: Payer: BLUE CROSS/BLUE SHIELD | Admitting: Internal Medicine

## 2015-10-14 ENCOUNTER — Encounter: Payer: Self-pay | Admitting: Obstetrics and Gynecology

## 2015-10-15 ENCOUNTER — Other Ambulatory Visit: Payer: Self-pay | Admitting: Obstetrics and Gynecology

## 2015-10-15 MED ORDER — OXYCODONE-ACETAMINOPHEN 5-325 MG PO TABS: 1.0000 | ORAL_TABLET | Freq: Four times a day (QID) | ORAL | Status: DC | PRN

## 2015-10-25 ENCOUNTER — Other Ambulatory Visit: Payer: Self-pay | Admitting: Obstetrics and Gynecology

## 2015-10-25 ENCOUNTER — Ambulatory Visit: Payer: BLUE CROSS/BLUE SHIELD

## 2015-10-26 ENCOUNTER — Encounter: Payer: Self-pay | Admitting: Obstetrics and Gynecology

## 2015-10-26 ENCOUNTER — Ambulatory Visit (INDEPENDENT_AMBULATORY_CARE_PROVIDER_SITE_OTHER): Payer: BLUE CROSS/BLUE SHIELD

## 2015-10-26 ENCOUNTER — Other Ambulatory Visit: Payer: Self-pay | Admitting: Obstetrics and Gynecology

## 2015-10-26 ENCOUNTER — Other Ambulatory Visit: Payer: Self-pay

## 2015-10-26 ENCOUNTER — Ambulatory Visit: Payer: BLUE CROSS/BLUE SHIELD

## 2015-10-26 DIAGNOSIS — R102 Pelvic and perineal pain: Secondary | ICD-10-CM

## 2015-10-26 NOTE — Progress Notes (Signed)
ERROR

## 2015-10-27 ENCOUNTER — Telehealth: Payer: Self-pay

## 2015-10-27 DIAGNOSIS — N809 Endometriosis, unspecified: Secondary | ICD-10-CM

## 2015-10-27 NOTE — Telephone Encounter (Signed)
Pt calls and states that she is in need of a short term refill of tramadol for her endometriosis pain, pt has an appt with Dr.Cherry on 10/29/2015. Last refill given on 09/23/2015 #60 please advise

## 2015-10-29 ENCOUNTER — Ambulatory Visit: Payer: BLUE CROSS/BLUE SHIELD | Admitting: Internal Medicine

## 2015-10-29 ENCOUNTER — Telehealth: Payer: Self-pay | Admitting: Internal Medicine

## 2015-10-29 DIAGNOSIS — Z0289 Encounter for other administrative examinations: Secondary | ICD-10-CM

## 2015-10-29 MED ORDER — TRAMADOL HCL 50 MG PO TABS: ORAL_TABLET | ORAL | Status: DC

## 2015-10-29 NOTE — Telephone Encounter (Signed)
Patient did not come in for their appointment today for 6 week follow up. Please note that pt called at 1135, saying that she got called into work.   Please let me know if patient needs to be contacted immediately for follow up or no follow up needed.

## 2015-10-29 NOTE — Telephone Encounter (Signed)
Per Dr.Defrancesco, pt may have Tramadol #10 to hold her over until Dr.cherry gets back, sent pt mychart message informing her of this.

## 2015-10-30 NOTE — Telephone Encounter (Signed)
Just have her reschedule at her earliest convenience. She works in [employer]. We should not charge a fee for her missed visit

## 2015-11-02 ENCOUNTER — Ambulatory Visit (INDEPENDENT_AMBULATORY_CARE_PROVIDER_SITE_OTHER): Payer: BLUE CROSS/BLUE SHIELD | Admitting: Obstetrics and Gynecology

## 2015-11-02 ENCOUNTER — Encounter: Payer: Self-pay | Admitting: Obstetrics and Gynecology

## 2015-11-02 VITALS — BP 124/89 | HR 91 | Ht 65.0 in | Wt 219.5 lb

## 2015-11-02 DIAGNOSIS — N9419 Other specified dyspareunia: Secondary | ICD-10-CM | POA: Diagnosis not present

## 2015-11-02 DIAGNOSIS — N949 Unspecified condition associated with female genital organs and menstrual cycle: Secondary | ICD-10-CM | POA: Diagnosis not present

## 2015-11-02 DIAGNOSIS — N809 Endometriosis, unspecified: Secondary | ICD-10-CM

## 2015-11-02 DIAGNOSIS — G8929 Other chronic pain: Secondary | ICD-10-CM | POA: Diagnosis not present

## 2015-11-02 DIAGNOSIS — R102 Pelvic and perineal pain: Secondary | ICD-10-CM

## 2015-11-02 MED ORDER — DANAZOL 200 MG PO CAPS: 400.0000 mg | ORAL_CAPSULE | Freq: Two times a day (BID) | ORAL | Status: DC

## 2015-11-02 MED ORDER — TRAMADOL HCL 50 MG PO TABS: ORAL_TABLET | ORAL | Status: DC

## 2015-11-02 NOTE — Progress Notes (Signed)
    GYNECOLOGY PROGRESS NOTE  Subjective:    Patient ID: Terri MaladyJennifer L Munoz, female    DOB: 1980-10-01, 35 y.o.   MRN: [id number]  HPI  Patient is a 35 y.o. [id number] female who presents for f/u after ultrasound for worsening pelvic pain.  Patient with h/o endometriosis and chronic pelvic pain who has been on Depo Provera for approximately 1 year.  Also with h/o hysterectomy several years ago, and with RSO in 01/2015 due to pelvic pain.  Patient notes that  Pain went away for a while, but has slowly returned and gotten progressively worse.  Did not get last Depo injection as she wanted to discuss other options. Also noting more significant dyspareunia.   The following portions of the patient's history were reviewed and updated as appropriate: allergies, current medications, past family history, past medical history, past social history, past surgical history and problem list.  Review of Systems Pertinent items noted in HPI and remainder of comprehensive ROS otherwise negative.   Objective:   Blood pressure 124/89, pulse 91, height 5\' 5"  (1.651 m), weight 219 lb 8 oz (99.565 kg), last menstrual period 10/15/2013. General appearance: alert and no distress Abdomen: soft, non-tender; bowel sounds normal; no masses,  no organomegaly Pelvic: external genitalia normal, rectovaginal septum normal.  Vagina without discharge.  Uterus and cervix surgically absent. Vaginal cuff with tenderness to palpation on left.  Mild tenderness at bladder neck. Adnexae non-palpable, nontender bilaterally. Extremities: extremities normal, atraumatic, no cyanosis or edema Neurologic: Grossly normal    Imaging:  ULTRASOUND REPORT  Location: [facility] Date of Service: 10/26/15   Indications:Pelvic Pain Findings:  The uterus is surgically absent..  Right Ovary is surgically absent. Left Ovary measures 2.3 x 1.5 x 1.6 cm. It is normal appearance. Survey of the adnexa demonstrates no adnexal  masses. There is no free fluid in the cul de sac.  Impression: 1. Normal left ovary.  Recommendations: 1.Clinical correlation with the patient's History and Physical Exam.   Assessment:   Endometriosis Chronic pelvic pain Dyspareunia  Plan:   - Discussed ultrasound, no new masses or significant findings.  - Discussion had with patient regarding treatment options again.  Has had h/o use of Lupron in the past but had undesirable side effects.  Has used OCPs, and now desires to discontinue the Depo Provera as it is becoming less effective.  Discussed use of Danazol.  Discussed side effects of medications.  Patient willing to try.  Will prescribe Danazol 400 mg BID. Can use for up to 9 months.  - Discussed methods to decrease dyspareunia.  - Desires refill on Tramadol.  Refill given.  - RTC in 1 month to f/u medication.    A total of 15 minutes were spent face-to-face with the patient during this encounter and over half of that time dealt with counseling and coordination of care.   Hildred LaserAnika Nolen Lindamood, MD [facility]

## 2015-11-03 ENCOUNTER — Encounter: Payer: Self-pay | Admitting: Obstetrics and Gynecology

## 2015-11-09 NOTE — Telephone Encounter (Signed)
appt already scheduled

## 2015-11-12 ENCOUNTER — Encounter: Payer: Self-pay | Admitting: Internal Medicine

## 2015-11-16 ENCOUNTER — Other Ambulatory Visit: Payer: Self-pay | Admitting: Obstetrics and Gynecology

## 2015-11-16 ENCOUNTER — Encounter: Payer: Self-pay | Admitting: Obstetrics and Gynecology

## 2015-11-16 DIAGNOSIS — N809 Endometriosis, unspecified: Secondary | ICD-10-CM

## 2015-11-16 MED ORDER — TRAMADOL HCL 50 MG PO TABS: ORAL_TABLET | ORAL | 4 refills | Status: DC

## 2015-11-18 ENCOUNTER — Encounter: Payer: Self-pay | Admitting: Podiatry

## 2015-11-18 ENCOUNTER — Ambulatory Visit (INDEPENDENT_AMBULATORY_CARE_PROVIDER_SITE_OTHER): Payer: BLUE CROSS/BLUE SHIELD

## 2015-11-18 ENCOUNTER — Ambulatory Visit (INDEPENDENT_AMBULATORY_CARE_PROVIDER_SITE_OTHER): Payer: BLUE CROSS/BLUE SHIELD | Admitting: Podiatry

## 2015-11-18 DIAGNOSIS — S86011A Strain of right Achilles tendon, initial encounter: Secondary | ICD-10-CM

## 2015-11-18 DIAGNOSIS — R52 Pain, unspecified: Secondary | ICD-10-CM

## 2015-11-18 DIAGNOSIS — M779 Enthesopathy, unspecified: Secondary | ICD-10-CM

## 2015-11-18 NOTE — Progress Notes (Signed)
   Subjective:    Patient ID: [name], female    DOB: Sep 21, 1980, 35 y.o.   MRN: [id number]  HPI  35 year old female presents the office today for complaints of pain to the back of her right heel she points the Achilles tendon. She states that she was walking and felt a pop and had increased pain to the back of her heel. She could've a doctor where she works and she was given the compression sock however she is this did not help. She states that since she had a sensation she's had continued pain without any relief. Denies any numbness or tingling. No other injury. No other complaints.  Review of Systems  All other systems reviewed and are negative.      Objective:   Physical Exam General: AAO x3, NAD  Dermatological: Skin is warm, dry and supple bilateral. Nails x 10 are well manicured; remaining integument appears unremarkable at this time. There are no open sores, no preulcerative lesions, no rash or signs of infection present.  Vascular: Dorsalis Pedis artery and Posterior Tibial artery pedal pulses are 2/4 bilateral with immedate capillary fill time. Pedal hair growth present. No varicosities and no lower extremity edema present bilateral. There is no pain with calf compression, swelling, warmth, erythema.   Neruologic: Grossly intact via light touch bilateral. Vibratory intact via tuning fork bilateral. Protective threshold with Semmes Wienstein monofilament intact to all pedal sites bilateral. Patellar and Achilles deep tendon reflexes 2+ bilateral. No Babinski or clonus noted bilateral.   Musculoskeletal: Janee Morn test is negative however there is tenderness along the distal portion of the Achilles tendon on the insertion into the calcaneus. There is trace edema along the area any erythema or increase in warmth. There is no defect noted in the Achilles tendon. There is no pain with lateral compression of the calcaneus. No other areas of tenderness bilaterally. MMT decreased on  the right side and plantar flexion.  Gait: Unassisted, Nonantalgic.      Assessment & Plan:  35 year old female right Achilles tendinitis, possible partial tear -Treatment options discussed including all alternatives, risks, and complications -Etiology of symptoms were discussed -X-rays were obtained and reviewed with the patient. No evidence of acute fracture. -At this time given her symptoms and clinical history we'll placed into a cam boot. This was dispensed today. At the heel lift to help take pressure off the Achilles tendon. -Continue compression sock -Ice and elevation -Limit weightbearing and hold off on any exercising. -She's been taking tramadol as well as naproxen. Recommended her continue with this. -Follow-up as scheduled or sooner if needed. If symptoms continue will get an MRI  Ovid Curd, DPM

## 2015-11-19 ENCOUNTER — Telehealth: Payer: Self-pay | Admitting: *Deleted

## 2015-11-19 NOTE — Telephone Encounter (Signed)
Pt states she saw Dr. Ardelle Anton yesterday, he put her in a boot, this morning the ankle is very painful and stiff. I asked pt if she was in the boot at this time and she said no.  I told pt that the stiffness is from the boot holding the ankle and foot stretched to a 90 degree angle, and holding it there to train the tendons to be stretched and immobile not continuing to flex and injure the area, and in the morning the area is stiffen and will need to be gradually stretched out again by the boot.  I told pt to remain in the boot at all times that she is not icing or sleeping.  Pt states understanding.

## 2015-11-22 ENCOUNTER — Ambulatory Visit: Payer: BLUE CROSS/BLUE SHIELD | Admitting: Internal Medicine

## 2015-11-27 ENCOUNTER — Other Ambulatory Visit: Payer: Self-pay | Admitting: Obstetrics and Gynecology

## 2015-11-27 DIAGNOSIS — G4700 Insomnia, unspecified: Secondary | ICD-10-CM

## 2015-11-28 ENCOUNTER — Encounter: Payer: Self-pay | Admitting: Obstetrics and Gynecology

## 2015-11-29 ENCOUNTER — Encounter: Payer: Self-pay | Admitting: Obstetrics and Gynecology

## 2015-11-30 ENCOUNTER — Other Ambulatory Visit: Payer: Self-pay | Admitting: Obstetrics and Gynecology

## 2015-11-30 ENCOUNTER — Ambulatory Visit: Payer: BLUE CROSS/BLUE SHIELD | Admitting: Podiatry

## 2015-11-30 ENCOUNTER — Ambulatory Visit: Payer: BLUE CROSS/BLUE SHIELD | Admitting: Obstetrics and Gynecology

## 2015-11-30 DIAGNOSIS — G4700 Insomnia, unspecified: Secondary | ICD-10-CM

## 2015-11-30 MED ORDER — ZOLPIDEM TARTRATE 5 MG PO TABS: 5.0000 mg | ORAL_TABLET | Freq: Every evening | ORAL | 3 refills | Status: DC | PRN

## 2015-11-30 MED ORDER — OXYCODONE-ACETAMINOPHEN 5-325 MG PO TABS: 1.0000 | ORAL_TABLET | Freq: Four times a day (QID) | ORAL | 0 refills | Status: DC | PRN

## 2015-11-30 NOTE — Progress Notes (Signed)
Patient requests refills on pain medications (Percocet) and ambien for sleep. Refill given.

## 2015-12-07 ENCOUNTER — Ambulatory Visit: Payer: BLUE CROSS/BLUE SHIELD | Admitting: Podiatry

## 2015-12-16 ENCOUNTER — Ambulatory Visit: Payer: BLUE CROSS/BLUE SHIELD | Admitting: Podiatry

## 2016-01-05 ENCOUNTER — Ambulatory Visit: Payer: BLUE CROSS/BLUE SHIELD | Admitting: Obstetrics and Gynecology

## 2016-01-11 ENCOUNTER — Ambulatory Visit (INDEPENDENT_AMBULATORY_CARE_PROVIDER_SITE_OTHER): Payer: BLUE CROSS/BLUE SHIELD | Admitting: Obstetrics and Gynecology

## 2016-01-11 ENCOUNTER — Encounter: Payer: Self-pay | Admitting: Obstetrics and Gynecology

## 2016-01-11 VITALS — BP 147/95 | HR 105 | Ht 65.0 in | Wt 214.6 lb

## 2016-01-11 DIAGNOSIS — G8929 Other chronic pain: Secondary | ICD-10-CM

## 2016-01-11 DIAGNOSIS — N949 Unspecified condition associated with female genital organs and menstrual cycle: Secondary | ICD-10-CM

## 2016-01-11 DIAGNOSIS — N809 Endometriosis, unspecified: Secondary | ICD-10-CM | POA: Diagnosis not present

## 2016-01-11 DIAGNOSIS — N941 Unspecified dyspareunia: Secondary | ICD-10-CM | POA: Diagnosis not present

## 2016-01-11 DIAGNOSIS — R102 Pelvic and perineal pain: Secondary | ICD-10-CM

## 2016-01-11 MED ORDER — LETROZOLE 2.5 MG PO TABS: 2.5000 mg | ORAL_TABLET | Freq: Every day | ORAL | 1 refills | Status: DC

## 2016-01-11 NOTE — Progress Notes (Signed)
    GYNECOLOGY PROGRESS NOTE  Subjective:    Patient ID: [name], female    DOB: 1980/09/28, 35 y.o.   MRN: [id number]  HPI  Patient is a 35 y.o. 42P1102 female who presents for f/u of medication (Danazol) for management of endometriosis. Has been on medication for approximately 4 weeks. Patient states that she doesn't really feel like the medication is helping. Is noting cramping into back and legs, as if she is about to start her menses, despite the fact she has had a hysterectomy. Patient reports that [facility], and was offered norethindrone and a muscle relaxer. Patient states that she took the norethindrone dose once and has not initiated the muscle relaxers as she does not feel that it is muscle pain.  Is also still noting dyspareunia.  The following portions of the patient's history were reviewed and updated as appropriate: allergies, current medications, past family history, past medical history, past social history, past surgical history and problem list.  Review of Systems Pertinent items noted in HPI and remainder of comprehensive ROS otherwise negative.   Objective:   Blood pressure (!) 147/95, pulse (!) 105, height 5\' 5"  (1.651 m), weight 214 lb 9.6 oz (97.3 kg), last menstrual period 10/15/2013. General appearance: alert and no distress Abdomen: mild tenderness in lower abdomen. Soft, no distention.  Pelvic: deferred   Assessment:   Endometriosis Dyspareunia Chronic pelvic pain  Plan:   - Discuss changing patient's medication from danazol to a different aromatase inhibitor such as letrozole or anastrozole.  With one of these medication she could also take the norethindrone as prescribed by a physician and [facility]. She may notice a better response with this combination. Called in prescription for Femara 2.5 mg daily. Patient can opt as to whether or not she would like to try the muscle relaxer.  Advised to give medications an additional  month or two before deciding on cessation.   Noted that as patient has tried the vast majority of medical therapies for endometriosis, her options are diminishing with regards to possibly proceeding with removal of remaining ovary. Other alternative is to revisit some prior medications and see if she would have any success at this point. - Also put in a referral to pelvic floor physical therapy for assistance with managing patient's pelvic pain and dyspareunia due to endometriosis.    RTC in 2 months for reassessment of symptoms.     A total of 15 minutes were spent face-to-face with the patient during this encounter and over half of that time dealt with counseling and coordination of care.  Hildred LaserAnika Candance Bohlman, MD [facility]

## 2016-01-12 ENCOUNTER — Encounter: Payer: Self-pay | Admitting: Obstetrics and Gynecology

## 2016-01-24 ENCOUNTER — Encounter: Payer: Self-pay | Admitting: Internal Medicine

## 2016-01-24 ENCOUNTER — Ambulatory Visit (INDEPENDENT_AMBULATORY_CARE_PROVIDER_SITE_OTHER): Payer: BLUE CROSS/BLUE SHIELD | Admitting: Internal Medicine

## 2016-01-24 VITALS — BP 146/98 | HR 92 | Temp 99.3°F | Wt 218.5 lb

## 2016-01-24 DIAGNOSIS — B309 Viral conjunctivitis, unspecified: Secondary | ICD-10-CM

## 2016-01-24 DIAGNOSIS — J029 Acute pharyngitis, unspecified: Secondary | ICD-10-CM

## 2016-01-24 MED ORDER — HYDROCODONE-HOMATROPINE 5-1.5 MG/5ML PO SYRP: 5.0000 mL | ORAL_SOLUTION | Freq: Three times a day (TID) | ORAL | 0 refills | Status: DC | PRN

## 2016-01-24 NOTE — Progress Notes (Signed)
Subjective:    Patient ID: [name], female    DOB: 1980/06/10, 35 y.o.   MRN: [id number]  HPI   Pt presents to the clinic today with c/o fatigue, left ear fullness left eye redness with drainage and sore throat. This started 2 days. She noticed increased eye redness with mucous like drainage on Saturday evening. Her sore throat began on Sunday. She has a dry, non productive cough in which she has been taking Tessalon Pearls with minimal relief. She reports increased fatigue and low grade temperature. She has not taken any other medications for her symptoms.  She denies N/V/D, SOB, runny nose, wheezing and any sick contacts.    Review of Systems  Past Medical History:  Diagnosis Date  . Abnormal Pap smear   . Anxiety   . Anxiety   . Endometriosis   . HA (headache)    otc med prn  . Hyperlipidemia    diet controlled, no meds  . Hypertension   . PONV (postoperative nausea and vomiting)   . Pregnancy induced hypertension 2008    Current Outpatient Prescriptions  Medication Sig Dispense Refill  . bisoprolol-hydrochlorothiazide (ZIAC) 10-6.25 MG tablet Take 1 tablet by mouth daily. 90 tablet 3  . letrozole (FEMARA) 2.5 MG tablet Take 1 tablet (2.5 mg total) by mouth daily. 60 tablet 1  . losartan-hydrochlorothiazide (HYZAAR) 100-25 MG tablet Take 1 tablet by mouth daily. 90 tablet 3  . naproxen (NAPROSYN) 500 MG tablet take 1 tablet by mouth twice a day if needed  0  . norethindrone (AYGESTIN) 5 MG tablet Take 5 mg by mouth.    . zolpidem (AMBIEN) 5 MG tablet take 1 tablet by mouth once daily    . HYDROcodone-homatropine (HYCODAN) 5-1.5 MG/5ML syrup Take 5 mLs by mouth every 8 (eight) hours as needed for cough. 120 mL 0   No current facility-administered medications for this visit.     Allergies  Allergen Reactions  . Sulfamethoxazole-Trimethoprim Swelling and Rash  . Amoxil [Amoxicillin] Rash  . Azithromycin Rash  . Cefprozil Rash  . Ceftin [Cefuroxime Axetil]  Rash  . Other Itching    CHG-wipes she had to use prier to surgery.  Ok to use CHG wash without problems CHG-wipes she had to use prier to surgery.  Ok to use CHG wash without problems  . Penicillins Rash  . Sulfa Antibiotics Rash    Family History  Problem Relation Age of Onset  . Hypertension Father   . Coronary artery disease Paternal Uncle   . Cancer Paternal Grandfather   . Diabetes Neg Hx     Social History   Social History  . Marital status: Married    Spouse name: N/A  . Number of children: 1  . Years of education: N/A   Occupational History  . 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 Smoking status: Never Smoker  . Smokeless tobacco: Never Used  . Alcohol use Yes     Comment: occasional  . Drug use: No  . Sexual activity: Yes    Birth control/ protection: Surgical   Other Topics Concern  . Not on file   Social History Narrative  . No narrative on file     Constitutional: Pt reports fever and fatigue. Denies malaise, headache or abrupt weight changes.  HEENT: Pt reports eye redness, drainage and  increased left ear fullness. No reports of visual changes. Denies eye pain, ear pain, ringing in the ears, wax buildup, runny nose, nasal congestion, bloody nose, or sore throat. Respiratory: Pt reports cough. Denies difficulty breathing, shortness of breath, or sputum production.   Cardiovascular: Denies chest pain, chest tightness, palpitations or swelling in the hands or feet.   No other specific complaints in a complete review of systems (except as listed in HPI above).     Objective:   Physical Exam BP (!) 146/98   Pulse 92   Temp 99.3 F (37.4 C) (Oral)   Wt 218 lb 8 oz (99.1 kg)   LMP 10/15/2013   SpO2 98%   BMI 36.36 kg/m  Wt Readings from Last 3 Encounters:  01/24/16 218 lb 8 oz (99.1 kg)  01/11/16 214 lb 9.6 oz (97.3 kg)  11/02/15 219 lb 8 oz  (99.6 kg)    General: Appears her stated age,  in NAD. Skin: Warm, dry and intact.  HEENT: Head: normal shape and size, no sinus tenderness noted; Left Eye: sclera mildly injected, conjunctiva pink; Ears: Tm's gray and intact with bilateral scarring, normal light reflex; Throat/Mouth: Teeth present, mucosa moist with erythema L>R, no exudate, lesions or ulcerations noted.  Neck:  No adenopathy noted.  Cardiovascular: Normal rate and rhythm. S1,S2 noted.  No murmur, rubs or gallops noted. No JVD or BLE edema.  Pulmonary/Chest: Normal effort and positive vesicular breath sounds. No respiratory distress. No wheezes, rales or ronchi noted.      BMET    Component Value Date/Time   NA 137 08/20/2015 1056   NA 138 07/20/2014 2125   K 4.0 08/20/2015 1056   K 4.0 07/20/2014 2125   CL 103 08/20/2015 1056   CL 105 07/20/2014 2125   CO2 22 08/20/2015 1056   CO2 24 07/20/2014 2125   GLUCOSE 117 (H) 08/20/2015 1056   GLUCOSE 87 07/20/2014 2125   BUN 12 08/20/2015 1056   BUN 12 07/20/2014 2125   CREATININE 0.78 08/20/2015 1056   CREATININE 0.74 07/20/2014 2125   CALCIUM 10.5 08/20/2015 1056   CALCIUM 9.4 07/20/2014 2125   GFRNONAA >60 03/19/2015 1925   GFRNONAA >60 07/20/2014 2125   GFRAA >60 03/19/2015 1925   GFRAA >60 07/20/2014 2125    Lipid Panel     Component Value Date/Time   CHOL 253 (H) 08/20/2015 1056   TRIG 270.0 (H) 08/20/2015 1056   HDL 47.10 08/20/2015 1056   CHOLHDL 5 08/20/2015 1056   VLDL 54.0 (H) 08/20/2015 1056    CBC    Component Value Date/Time   WBC 8.3 08/20/2015 1056   RBC 4.83 08/20/2015 1056   HGB 14.5 08/20/2015 1056   HGB 12.9 07/20/2014 2125   HCT 42.6 08/20/2015 1056   HCT 38.4 07/20/2014 2125   PLT 405.0 (H) 08/20/2015 1056   PLT 318 07/20/2014 2125   MCV 88.1 08/20/2015 1056   MCV 91 07/20/2014 2125   MCH 30.4 03/19/2015 1925   MCHC 34.0 08/20/2015 1056   RDW 13.0 08/20/2015 1056   RDW 13.2 07/20/2014 2125   LYMPHSABS 2.2 08/20/2015  1056   MONOABS 0.5 08/20/2015 1056   EOSABS 0.1 08/20/2015 1056   BASOSABS 0.0 08/20/2015 1056    Hgb A1C No results found for: HGBA1C          Assessment & Plan:  Viral pharyngitis with left eye conjunctivitis  RST negative 80 mg Depo IM today RX Hycodan for cough suppression Increased oral fluids  Can take Ibuprofen for throat inflammation Can use saline drops for increased eye redness  RTC as needed or if symptoms persist or worsen Keoni Risinger, NP

## 2016-01-24 NOTE — Patient Instructions (Signed)

## 2016-01-26 LAB — POCT RAPID STREP A (OFFICE): Rapid Strep A Screen: NEGATIVE

## 2016-01-26 MED ORDER — METHYLPREDNISOLONE ACETATE 80 MG/ML IJ SUSP
80.0000 mg | Freq: Once | INTRAMUSCULAR | Status: AC
  Administered 2016-01-24: 80 mg via INTRAMUSCULAR

## 2016-01-26 NOTE — Addendum Note (Signed)
Addended by: Roena MaladyEVONTENNO, Axelle Szwed Y on: 01/26/2016 09:09 AM   Modules accepted: Orders

## 2016-01-27 ENCOUNTER — Encounter: Payer: Self-pay | Admitting: Obstetrics and Gynecology

## 2016-01-27 ENCOUNTER — Other Ambulatory Visit: Payer: Self-pay | Admitting: Obstetrics and Gynecology

## 2016-01-27 MED ORDER — OXYCODONE-ACETAMINOPHEN 5-325 MG PO TABS: 1.0000 | ORAL_TABLET | Freq: Four times a day (QID) | ORAL | 0 refills | Status: DC | PRN

## 2016-02-03 ENCOUNTER — Other Ambulatory Visit: Payer: Self-pay | Admitting: Obstetrics and Gynecology

## 2016-02-07 ENCOUNTER — Other Ambulatory Visit: Payer: BLUE CROSS/BLUE SHIELD

## 2016-02-08 ENCOUNTER — Ambulatory Visit (INDEPENDENT_AMBULATORY_CARE_PROVIDER_SITE_OTHER): Payer: BLUE CROSS/BLUE SHIELD | Admitting: Obstetrics and Gynecology

## 2016-02-08 ENCOUNTER — Encounter: Payer: BLUE CROSS/BLUE SHIELD | Admitting: Obstetrics and Gynecology

## 2016-02-08 VITALS — BP 161/96 | HR 92 | Ht 65.0 in | Wt 214.5 lb

## 2016-02-08 DIAGNOSIS — R102 Pelvic and perineal pain: Secondary | ICD-10-CM

## 2016-02-08 DIAGNOSIS — G8929 Other chronic pain: Secondary | ICD-10-CM | POA: Diagnosis not present

## 2016-02-08 DIAGNOSIS — I10 Essential (primary) hypertension: Secondary | ICD-10-CM | POA: Diagnosis not present

## 2016-02-08 DIAGNOSIS — Z9889 Other specified postprocedural states: Secondary | ICD-10-CM

## 2016-02-08 DIAGNOSIS — N809 Endometriosis, unspecified: Secondary | ICD-10-CM | POA: Diagnosis not present

## 2016-02-08 NOTE — Progress Notes (Signed)
GYNECOLOGY PROGRESS NOTE  Subjective:    Patient ID: [name], female    DOB: [birth year], 35 y.o.   MRN: [id number]  HPI  Patient is a 35 y.o. [id number] female who presents for discussion of possible surgical management for chronic pelvic pain.  Patient has a h/o endometriosis, s/p laparoscopic supracervical hysterectomy with bilateral salpingectomy and later an left oophorectomy.  Patient has been tried on several treatments (Depo Provera, Lupron, and now Danazole), however notes that the pain has slowly increased in intensity.  Patient also with h/o multiple abdominal surgeries (2 C-sections, hysterectomy, several laparoscopies with lysis of adhesions, oophorectomy).  Notes that she is having to use her Percocets more regularly.     The following portions of the patient's history were reviewed and updated as appropriate: allergies, current medications, past family history, past medical history, past social history, past surgical history and problem list.  Review of Systems Pertinent items noted in HPI and remainder of comprehensive ROS otherwise negative.   Objective:   Blood pressure (!) 161/96, pulse 92, height 5\' 5"  (1.651 m), weight 214 lb 8 oz (97.3 kg), last menstrual period 10/15/2013. General appearance: alert and no distress Abdomen: soft, mildly tender in LLQ.  No masses or organomegaly Pelvic: external genitalia normal, rectovaginal septum normal, vagina normal without discharge and cervix with tenderness on mobility.  No nodularity noted in vagina, but tenderness digital exam noted, and to palpation along left vaginal sidewall  Extremities: extremities normal, atraumatic, no cyanosis or edema Neurologic: Grossly normal   Assessment:   H/o endometriosis Chronic pelvic pain HTN (elevated today)  Plan:   - Discussion had on management options for endometriosis. Patient has been on several treatments, each with decreasing response after use for a moderate  period of time. Patient inquires into surgical management with laparoscopy with adhesiolysis if present and removal of new endometriosis implants if present.  Does not desire removal of remaining ovary at this time due to previously discussed risks of osteoporosis if surgical menopause at an early age, and does not desire to have to use HRT.  Patient understands that the current desired laparoscopic management is temporizing, and that her symptoms may return after some time (within 3 months to several years).  However, the surgery may also help to make patient once again more responsive to previously used medication management.    - The risks of surgery were discussed in detail with the patient including but not limited to: bleeding which may require transfusion or reoperation; infection which may require prolonged hospitalization or re-hospitalization and antibiotic therapy; injury to bowel, bladder, ureters and major vessels or other surrounding organs; need for additional procedures including laparotomy; thromboembolic phenomenon, incisional problems and other postoperative or anesthesia complications. The postoperative expectations were also discussed in detail. The patient also understands the alternative treatment options which were discussed in full. All questions were answered.  Routine preoperative instructions of having nothing to eat or drink after midnight on the day prior to surgery and also coming to the hospital 1.5 hours prior to her time of surgery were also emphasized.  Patient has a pre-operative visit on Thursday.  Is scheduled for surgery on 02/14/2016.  Printed patient education handouts about the procedure were given to the patient to review at home. - Chronic pelvic pain.  Patient requests refill on Percocet.  Is using Tramadol for milder pain.  Hopes to reduce use of pain meds with discussed procedure.  - HTN currently uncontrolled today.  Is  compliant with meds. Patient notes that it is  due to stress and not sleeping as one of her daughters was recently diagnosed with the flu, and the other had been diagnosed with strep throat in the weeks prior. Discussed that patient may have been exposed, and to let pre-op know of any URI symptoms, fevers, chills if prior to surgery so that she can be treated and her surgery rescheduled.    A total of 25 minutes were spent face-to-face with the patient during this encounter and over half of that time involved counseling and coordination of care.    Hildred Laser, MD [facility]

## 2016-02-08 NOTE — Patient Instructions (Addendum)
You are scheduled for surgery on 02/14/2016.  Nothing to eat after midnight on day prior to surgery.  Do not take any medications unless recommended by your provider on day prior to surgery.  Do not take NSAIDs (Motrin, Aleve) or aspirin 7 days prior to surgery.  You may take Tylenol products for minor aches and pains.  You will receive a prescription for pain medications post-operatively.  You will be contacted by phone approximately 1 week prior to surgery to schedule pre-operative appointment.  Please call the office if you have any questions regarding your upcoming surgery.     Diagnostic Laparoscopy A diagnostic laparoscopy is a procedure to diagnose diseases in the abdomen. During the procedure, a thin, lighted, pencil-sized instrument called a laparoscope is inserted into the abdomen through an incision. The laparoscope allows your health care provider to look at the organs inside your body. LET [facility] KNOW ABOUT:  Any allergies you have.  All medicines you are taking, including vitamins, herbs, eye drops, creams, and over-the-counter medicines.  Previous problems you or members of your family have had with the use of anesthetics.  Any blood disorders you have.  Previous surgeries you have had.  Medical conditions you have. RISKS AND COMPLICATIONS  Generally, this is a safe procedure. However, problems can occur, which may include:  Infection.  Bleeding.  Damage to other organs.  Allergic reaction to the anesthetics used during the procedure. BEFORE THE PROCEDURE  Do not eat or drink anything after midnight on the night before the procedure or as directed by your health care provider.  Ask your health care provider about:  Changing or stopping your regular medicines.  Taking medicines such as aspirin and ibuprofen. These medicines can thin your blood. Do not take these medicines before your procedure if your health care provider instructs you not  to.  Plan to have someone take you home after the procedure. PROCEDURE  You may be given a medicine to help you relax (sedative).  You will be given a medicine to make you sleep (general anesthetic).  Your abdomen will be inflated with a gas. This will make your organs easier to see.  Small incisions will be made in your abdomen.  A laparoscope and other small instruments will be inserted into the abdomen through the incisions.  A tissue sample may be removed from an organ in the abdomen for examination.  The instruments will be removed from the abdomen.  The gas will be released.  The incisions will be closed with stitches (sutures). AFTER THE PROCEDURE  Your blood pressure, heart rate, breathing rate, and blood oxygen level will be monitored often until the medicines you were given have worn off.   This information is not intended to replace advice given to you by your health care provider. Make sure you discuss any questions you have with your health care provider.   Document Released: 07/10/2000 Document Revised: 12/23/2014 Document Reviewed: 11/14/2013 Elsevier Interactive Patient Education [employer]2016 Elsevier Inc.

## 2016-02-08 NOTE — H&P (Signed)
GYNECOLOGY PRE-OPERATIVE HISTORY AND PHYSICAL  Subjective:   [name] is a 35 y.o. [id number] here for surgical management of mild right sided pelvic pain mid to moderate left sided pelvic pain, and history of endometriosis.  Planned procedure is laparoscopic adhesiolysis and possible excision/fulguration of endometriosis.   Significant preoperative concerns: Patient has had laparoscopic supracervical hysterectomy (10/2013) and right oophorectomy, plus 4 other operative laparoscopies for lysis of adhesions/excision of endometriosis. In addition, has had 2 C-sections.   Pertinent Gynecological History: Menses: none.  Patient has had a hysterectomy Bleeding: occasional post-coital bleeding Last pap: normal Date: 05/2014 Menarche age: 64  Discussed Blood/Blood Products: no     Past Medical History:  Diagnosis Date  . Abnormal Pap smear   . Anxiety   . Anxiety   . Endometriosis   . HA (headache)    otc med prn  . Hyperlipidemia    diet controlled, no meds  . Hypertension   . PONV (postoperative nausea and vomiting)   . Pregnancy induced hypertension 2008    Past Surgical History:  Procedure Laterality Date  . ABDOMINAL HYSTERECTOMY  7/15   for endometriosis  . ABLATION ON ENDOMETRIOSIS N/A 06/04/2014   Procedure: ABLATION ON ENDOMETRIOSIS;  Surgeon: Leslie Andrea, MD;  Location: [facility];  Service: Gynecology;  Laterality: N/A;  . BIOPSY  12/01/2011   Procedure: BIOPSY;  Surgeon: Leslie Andrea, MD;  Location: [facility];  Service: Gynecology;  Laterality: N/A;  . CESAREAN SECTION  2008  . CESAREAN SECTION WITH BILATERAL TUBAL LIGATION N/A 12/02/2012   Procedure: Repeat Cesarean Section Delivery Baby Girl @ 1826, Apgars 8/9, Bilateral Tubal Ligation;  Surgeon: Leslie Andrea, MD;  Location: [facility];  Service: Obstetrics;  Laterality: N/A;  . COLPOSCOPY     as teenager, results WNL  . ENDOMETRIAL BIOPSY    . LAPAROSCOPIC BILATERAL SALPINGECTOMY Bilateral  02/15/2015   Procedure: LAPAROSCOPIC BILATERAL SALPINGECTOMY;  Surgeon: Hildred Laser, MD;  Location: [facility];  Service: Gynecology;  Laterality: Bilateral;  . LAPAROSCOPIC LYSIS OF ADHESIONS N/A 06/04/2014   Procedure: LAPAROSCOPIC LYSIS OF ADHESIONS;  Surgeon: Leslie Andrea, MD;  Location: [facility];  Service: Gynecology;  Laterality: N/A;  . LAPAROSCOPY  12/01/2011   Procedure: LAPAROSCOPY OPERATIVE;  Surgeon: Leslie Andrea, MD;  Location: [facility];  Service: Gynecology;  Laterality: N/A;  . LAPAROSCOPY N/A 06/04/2014   Procedure: LAPAROSCOPY OPERATIVE;  Surgeon: Leslie Andrea, MD;  Location: [facility];  Service: Gynecology;  Laterality: N/A;  . OOPHORECTOMY Left   . SALPINGECTOMY Bilateral   . TONSILLECTOMY    . TONSILLECTOMY    . TUBAL LIGATION    . TYMPANOSTOMY TUBE PLACEMENT      OB History  Gravida Para Term Preterm AB Living  2 2 1 1   2   SAB TAB Ectopic Multiple Live Births          2    # Outcome Date GA Lbr Len/2nd Weight Sex Delivery Anes PTL Lv  2 Preterm 12/02/12 [redacted]w[redacted]d  6 lb 6.8 oz (2.915 kg) F CS-LTranv Spinal  LIV  1 Term 2008 [redacted]w[redacted]d  6 lb 14 oz (3.118 kg) F CS-LTranv Spinal N LIV     Birth Comments:  pre-e on MAG, IOL. failure to progress      Social History   Social History  . Marital status: Married    Spouse name: N/A  . Number of children: 1  . Years  of education: N/A   Occupational History  . [employer] [place] [employer]    only once in a while  . Dis[employer]    full time   Social History Main Topics  . Smoking status: Never Smoker  . Smokeless tobacco: Never Used  . Alcohol use Yes     Comment: occasional  . Drug use: No  . Sexual activity: Yes    Birth control/ protection: Surgical   Other Topics Concern  . Not on file   Social History Narrative  . No narrative on file    Family History  Problem Relation Age of Onset  . Hypertension Father   . Coronary artery disease Paternal Uncle   . Cancer Paternal  Grandfather   . Diabetes Neg Hx     Current Outpatient Prescriptions on File Prior to Visit  Medication Sig Dispense Refill  . bisoprolol-hydrochlorothiazide (ZIAC) 10-6.25 MG tablet Take 1 tablet by mouth daily. 90 tablet 3  . HYDROcodone-homatropine (HYCODAN) 5-1.5 MG/5ML syrup Take 5 mLs by mouth every 8 (eight) hours as needed for cough. 120 mL 0  . letrozole (FEMARA) 2.5 MG tablet Take 1 tablet (2.5 mg total) by mouth daily. 60 tablet 1  . losartan-hydrochlorothiazide (HYZAAR) 100-25 MG tablet Take 1 tablet by mouth daily. 90 tablet 3  . naproxen (NAPROSYN) 500 MG tablet take 1 tablet by mouth twice a day if needed  0  . norethindrone (AYGESTIN) 5 MG tablet Take 5 mg by mouth.    . oxyCODONE-acetaminophen (PERCOCET/ROXICET) 5-325 MG tablet Take 1-2 tablets by mouth every 6 (six) hours as needed. 60 tablet 0  . traMADol (ULTRAM) 50 MG tablet take 1 tablet by mouth every 6 hours if needed 60 tablet 2  . zolpidem (AMBIEN) 5 MG tablet take 1 tablet by mouth once daily     No current facility-administered medications on file prior to visit.      Allergies  Allergen Reactions  . Sulfamethoxazole-Trimethoprim Swelling and Rash  . Amoxil [Amoxicillin] Rash  . Azithromycin Rash  . Cefprozil Rash  . Ceftin [Cefuroxime Axetil] Rash  . Other Itching    CHG-wipes she had to use prier to surgery.  Ok to use CHG wash without problems CHG-wipes she had to use prier to surgery.  Ok to use CHG wash without problems  . Penicillins Rash  . Sulfa Antibiotics Rash    Review of Systems Constitutional: No recent fever/chills/sweats Respiratory: No recent cough/bronchitis Cardiovascular: No chest pain Gastrointestinal: No recent nausea/vomiting/diarrhea Genitourinary: No UTI symptoms Hematologic/lymphatic:No history of coagulopathy or recent blood thinner use    Objective:    BP (!) 161/96 (BP Location: Left Arm, Patient Position: Sitting, Cuff Size: Large)   Pulse 92   Ht 5\' 5"  (1.651 m)    Wt 214 lb 8 oz (97.3 kg)   LMP 10/15/2013   BMI 35.69 kg/m   General:   Normal  Skin:   normal  HEENT:  Normal  Neck:  Supple without Adenopathy or Thyromegaly  Lungs:   Heart:              Breasts:   Abdomen:  Pelvis:  M/S   Back:   Extremeties:  Neuro:    clear to auscultation bilaterally   Normal without murmur   Not Examined   soft, mildly tender in lower abdomen; bowel sounds normal; no masses,  no organomegaly   Exam deferred to OR  No CVAT  Warm/Dry  Normal  ULTRASOUND REPORT  Location: [facility] Date of Service: 10/26/15   Indications:Pelvic Pain Findings:  The uterus is surgically absent..  Right Ovary is surgically absent. Left Ovary measures 2.3 x 1.5 x 1.6 cm. It is normal appearance. Survey of the adnexa demonstrates no adnexal masses. There is no free fluid in the cul de sac.  Impression: 1. Normal left ovary.  Recommendations: 1.Clinical correlation with the patient's History and Physical Exam.   Assessment:    Chronic pelvic pain Endometriosis H/o multiple prior abdominal surgeries HTN   Plan:    Counseling: Procedure, risks, reasons, benefits and complications (including injury to bowel, bladder, major blood vessel, ureter, bleeding, possibility of transfusion, infection, or fistula formation) reviewed in detail. Consent signed. Preop testing ordered. Instructions reviewed, including NPO after midnight. HTN usually better controlled.  Patient notes being stressed recently due to her daughter being sick.     Hildred Laser, MD [facility]

## 2016-02-09 DIAGNOSIS — Z9889 Other specified postprocedural states: Secondary | ICD-10-CM | POA: Insufficient documentation

## 2016-02-10 ENCOUNTER — Ambulatory Visit (INDEPENDENT_AMBULATORY_CARE_PROVIDER_SITE_OTHER): Payer: BLUE CROSS/BLUE SHIELD | Admitting: Family Medicine

## 2016-02-10 ENCOUNTER — Encounter: Payer: Self-pay | Admitting: Family Medicine

## 2016-02-10 ENCOUNTER — Encounter
Admission: RE | Admit: 2016-02-10 | Discharge: 2016-02-10 | Disposition: A | Discharge status: Home / Self Care | Payer: BLUE CROSS/BLUE SHIELD | Source: Ambulatory Visit | Attending: Obstetrics and Gynecology | Admitting: Obstetrics and Gynecology

## 2016-02-10 VITALS — BP 136/90 | HR 73 | Temp 98.7°F | Wt 213.2 lb

## 2016-02-10 DIAGNOSIS — Z01818 Encounter for other preprocedural examination: Secondary | ICD-10-CM | POA: Diagnosis not present

## 2016-02-10 DIAGNOSIS — B9789 Other viral agents as the cause of diseases classified elsewhere: Secondary | ICD-10-CM | POA: Diagnosis not present

## 2016-02-10 DIAGNOSIS — J069 Acute upper respiratory infection, unspecified: Secondary | ICD-10-CM

## 2016-02-10 DIAGNOSIS — Z01812 Encounter for preprocedural laboratory examination: Secondary | ICD-10-CM | POA: Insufficient documentation

## 2016-02-10 DIAGNOSIS — N941 Unspecified dyspareunia: Secondary | ICD-10-CM | POA: Insufficient documentation

## 2016-02-10 DIAGNOSIS — R102 Pelvic and perineal pain: Secondary | ICD-10-CM | POA: Diagnosis not present

## 2016-02-10 DIAGNOSIS — G8929 Other chronic pain: Secondary | ICD-10-CM | POA: Diagnosis not present

## 2016-02-10 DIAGNOSIS — N809 Endometriosis, unspecified: Secondary | ICD-10-CM | POA: Insufficient documentation

## 2016-02-10 DIAGNOSIS — I10 Essential (primary) hypertension: Secondary | ICD-10-CM | POA: Diagnosis not present

## 2016-02-10 HISTORY — DX: Cough: R05

## 2016-02-10 LAB — CBC
HCT: 43 % (ref 35.0–47.0)
Hemoglobin: 14.5 g/dL (ref 12.0–16.0)
MCH: 30.2 pg (ref 26.0–34.0)
MCHC: 33.8 g/dL (ref 32.0–36.0)
MCV: 89.3 fL (ref 80.0–100.0)
Platelets: 397 10*3/uL (ref 150–440)
RBC: 4.82 MIL/uL (ref 3.80–5.20)
RDW: 13 % (ref 11.5–14.5)
WBC: 10.7 10*3/uL (ref 3.6–11.0)

## 2016-02-10 LAB — POTASSIUM: Potassium: 3.4 mmol/L — ABNORMAL LOW (ref 3.5–5.1)

## 2016-02-10 LAB — SURGICAL PCR SCREEN
MRSA, PCR: NEGATIVE
Staphylococcus aureus: NEGATIVE

## 2016-02-10 MED ORDER — HYDROCODONE-HOMATROPINE 5-1.5 MG/5ML PO SYRP: 5.0000 mL | ORAL_SOLUTION | Freq: Three times a day (TID) | ORAL | 0 refills | Status: DC | PRN

## 2016-02-10 NOTE — Progress Notes (Signed)
Subjective:    Patient ID: [name], female    DOB: 1980-11-02, 35 y.o.   MRN: [id number]  HPI This is a 35 yo female who presents today with sore throat, cough, nasal congestion, ear pressure x 2 days. Clear nasal drainage with some yellow tint. No wheeze or SOB. No fever. Has taken tylenol and alka seltzer cold and flu . Can't tell a difference with symptoms. Coughing all night. Is scheduled for surgery 02/14/16 exploratory lap for possible adhesions.  Family has been sick off and on for about a month.   Past Medical History:  Diagnosis Date  . Abnormal Pap smear   . Anxiety   . Anxiety   . Cough    HAS COLD. PRODUCTIVE CLEAR. NO FEVER. NO WHEEZING. TO SEE PCP IF WORSENS  . Endometriosis   . HA (headache)    otc med prn  . Hyperlipidemia    diet controlled, no meds  . Hypertension   . PONV (postoperative nausea and vomiting)   . Pregnancy induced hypertension 2008   Past Surgical History:  Procedure Laterality Date  . ABDOMINAL HYSTERECTOMY  7/15   for endometriosis  . ABLATION ON ENDOMETRIOSIS N/A 06/04/2014   Procedure: ABLATION ON ENDOMETRIOSIS;  Surgeon: Leslie AndreaJames E Tomblin II, MD;  Location: [facility];  Service: Gynecology;  Laterality: N/A;  . BIOPSY  12/01/2011   Procedure: BIOPSY;  Surgeon: Leslie AndreaJames E Tomblin II, MD;  Location: [facility];  Service: Gynecology;  Laterality: N/A;  . CESAREAN SECTION  2008  . CESAREAN SECTION WITH BILATERAL TUBAL LIGATION N/A 12/02/2012   Procedure: Repeat Cesarean Section Delivery Baby Girl @ 1826, Apgars 8/9, Bilateral Tubal Ligation;  Surgeon: Leslie AndreaJames E Tomblin II, MD;  Location: [facility];  Service: Obstetrics;  Laterality: N/A;  . COLPOSCOPY     as teenager, results WNL  . ENDOMETRIAL BIOPSY    . LAPAROSCOPIC BILATERAL SALPINGECTOMY Bilateral 02/15/2015   Procedure: LAPAROSCOPIC BILATERAL SALPINGECTOMY;  Surgeon: Hildred LaserAnika Cherry, MD;  Location: [facility];  Service: Gynecology;  Laterality: Bilateral;  . LAPAROSCOPIC LYSIS OF ADHESIONS N/A  06/04/2014   Procedure: LAPAROSCOPIC LYSIS OF ADHESIONS;  Surgeon: Leslie AndreaJames E Tomblin II, MD;  Location: [facility];  Service: Gynecology;  Laterality: N/A;  . LAPAROSCOPY  12/01/2011   Procedure: LAPAROSCOPY OPERATIVE;  Surgeon: Leslie AndreaJames E Tomblin II, MD;  Location: [facility];  Service: Gynecology;  Laterality: N/A;  . LAPAROSCOPY N/A 06/04/2014   Procedure: LAPAROSCOPY OPERATIVE;  Surgeon: Leslie AndreaJames E Tomblin II, MD;  Location: [facility];  Service: Gynecology;  Laterality: N/A;  . OOPHORECTOMY Left   . SALPINGECTOMY Bilateral   . TONSILLECTOMY    . TONSILLECTOMY    . TUBAL LIGATION    . TYMPANOSTOMY TUBE PLACEMENT     Family History  Problem Relation Age of Onset  . Hypertension Father   . Coronary artery disease Paternal Uncle   . Cancer Paternal Grandfather   . Diabetes Neg Hx    Social History  Substance Use Topics  . Smoking status: Never Smoker  . Smokeless tobacco: Never Used  . Alcohol use Yes     Comment: occasional      Review of Systems Per HPI    Objective:   Physical Exam  Constitutional: She is oriented to person, place, and time. She appears well-developed and well-nourished. No distress.  obese  HENT:  Head: Normocephalic and atraumatic.  Right Ear: External ear normal. Tympanic membrane is retracted.  Left Ear: External ear normal. Tympanic membrane is retracted.  Nose: Mucosal edema and rhinorrhea present.  Mouth/Throat: Uvula is midline and mucous membranes are normal. No oropharyngeal exudate, posterior oropharyngeal edema or posterior oropharyngeal erythema.  Eyes: Conjunctivae are normal.  Neck: Normal range of motion. Neck supple.  Cardiovascular: Normal rate, regular rhythm and normal heart sounds.   Pulmonary/Chest: Effort normal and breath sounds normal. No respiratory distress. She has no wheezes. She has no rales.  Lymphadenopathy:    She has no cervical adenopathy.  Neurological: She is alert and oriented to person, place, and time.  Skin: Skin is warm and  dry. She is not diaphoretic.  Psychiatric: She has a normal mood and affect. Her behavior is normal. Judgment and thought content normal.  Vitals reviewed.     BP 136/90 (BP Location: Right Arm, Patient Position: Sitting, Cuff Size: Large)   Pulse 73   Temp 98.7 F (37.1 C) (Oral)   Wt 213 lb 4 oz (96.7 kg)   LMP 10/15/2013   SpO2 98%   BMI 35.49 kg/m  Wt Readings from Last 3 Encounters:  02/10/16 213 lb 4 oz (96.7 kg)  02/08/16 214 lb 8 oz (97.3 kg)  01/24/16 218 lb 8 oz (99.1 kg)       Assessment & Plan:  1. Viral URI with cough - no s/s bacterial infection, discussed dx and treatment with patient- symptomatic treatment, fluids, rest - HYDROcodone-homatropine (HYCODAN) 5-1.5 MG/5ML syrup; Take 5 mLs by mouth every 8 (eight) hours as needed for cough.  Dispense: 120 mL; Refill: 0 - RTC precautions reviewed  Olean Ree, FNP-BC  [facility], [facility]  02/11/2016 3:33 PM

## 2016-02-10 NOTE — Patient Instructions (Addendum)
For nasal congestion you can use Afrin nasal spray for 3 days max, saline nasal spray (generic is fine for all). Take a daily over the counter antihistamine (zyrtec, claritin, allegra- generic is fine) Drink enough fluids to make your urine light yellow. For fever/chill/muscle aches you can take over the counter acetaminophen.  Please come back in if you are not better in 5-7 days or if you develop wheezing, shortness of breath or persistent vomiting.   Upper Respiratory Infection, Adult Most upper respiratory infections (URIs) are a viral infection of the air passages leading to the lungs. A URI affects the nose, throat, and upper air passages. The most common type of URI is nasopharyngitis and is typically referred to as "the common cold." URIs run their course and usually go away on their own. Most of the time, a URI does not require medical attention, but sometimes a bacterial infection in the upper airways can follow a viral infection. This is called a secondary infection. Sinus and middle ear infections are common types of secondary upper respiratory infections. Bacterial pneumonia can also complicate a URI. A URI can worsen asthma and chronic obstructive pulmonary disease (COPD). Sometimes, these complications can require emergency medical care and may be life threatening.  CAUSES Almost all URIs are caused by viruses. A virus is a type of germ and can spread from one person to another.  RISKS FACTORS You may be at risk for a URI if:   You smoke.   You have chronic heart or lung disease.  You have a weakened defense (immune) system.   You are very young or very old.   You have nasal allergies or asthma.  You work in crowded or poorly ventilated areas.  You work in health care facilities or schools. SIGNS AND SYMPTOMS  Symptoms typically develop 2-3 days after you come in contact with a cold virus. Most viral URIs last 7-10 days. However, viral URIs from the influenza virus  (flu virus) can last 14-18 days and are typically more severe. Symptoms may include:   Runny or stuffy (congested) nose.   Sneezing.   Cough.   Sore throat.   Headache.   Fatigue.   Fever.   Loss of appetite.   Pain in your forehead, behind your eyes, and over your cheekbones (sinus pain).  Muscle aches.  DIAGNOSIS  Your health care provider may diagnose a URI by:  Physical exam.  Tests to check that your symptoms are not due to another condition such as:  Strep throat.  Sinusitis.  Pneumonia.  Asthma. TREATMENT  A URI goes away on its own with time. It cannot be cured with medicines, but medicines may be prescribed or recommended to relieve symptoms. Medicines may help:  Reduce your fever.  Reduce your cough.  Relieve nasal congestion. HOME CARE INSTRUCTIONS   Take medicines only as directed by your health care provider.   Gargle warm saltwater or take cough drops to comfort your throat as directed by your health care provider.  Use a warm mist humidifier or inhale steam from a shower to increase air moisture. This may make it easier to breathe.  Drink enough fluid to keep your urine clear or pale yellow.   Eat soups and other clear broths and maintain good nutrition.   Rest as needed.   Return to work when your temperature has returned to normal or as your health care provider advises. You may need to stay home longer to avoid infecting others. You can  also use a face mask and careful hand washing to prevent spread of the virus.  Increase the usage of your inhaler if you have asthma.   Do not use any tobacco products, including cigarettes, chewing tobacco, or electronic cigarettes. If you need help quitting, ask your health care provider. PREVENTION  The best way to protect yourself from getting a cold is to practice good hygiene.   Avoid oral or hand contact with people with cold symptoms.   Wash your hands often if contact occurs.   There is no clear evidence that vitamin C, vitamin E, echinacea, or exercise reduces the chance of developing a cold. However, it is always recommended to get plenty of rest, exercise, and practice good nutrition.  SEEK MEDICAL CARE IF:   You are getting worse rather than better.   Your symptoms are not controlled by medicine.   You have chills.  You have worsening shortness of breath.  You have brown or red mucus.  You have yellow or brown nasal discharge.  You have pain in your face, especially when you bend forward.  You have a fever.  You have swollen neck glands.  You have pain while swallowing.  You have white areas in the back of your throat. SEEK IMMEDIATE MEDICAL CARE IF:   You have severe or persistent:  Headache.  Ear pain.  Sinus pain.  Chest pain.  You have chronic lung disease and any of the following:  Wheezing.  Prolonged cough.  Coughing up blood.  A change in your usual mucus.  You have a stiff neck.  You have changes in your:  Vision.  Hearing.  Thinking.  Mood. MAKE SURE YOU:   Understand these instructions.  Will watch your condition.  Will get help right away if you are not doing well or get worse.   This information is not intended to replace advice given to you by your health care provider. Make sure you discuss any questions you have with your health care provider.   Document Released: 09/27/2000 Document Revised: 08/18/2014 Document Reviewed: 07/09/2013 Elsevier Interactive Patient Education [employer]2016 [employer] Inc.

## 2016-02-10 NOTE — Patient Instructions (Signed)
  Your procedure is scheduled on: 02/14/16 Report to Day Surgery. [facility] To find out your arrival time please call [phone] between 1PM - 3PM on 02/11/16.  Remember: Instructions that are not followed completely may result in serious medical risk, up to and including death, or upon the discretion of your surgeon and anesthesiologist your surgery may need to be rescheduled.    __X__ 1. Do not eat food or drink liquids after midnight. No gum chewing or hard candies.     __X_ 2. No Alcohol for 24 hours before or after surgery.   __X_ 3. Do Not Smoke For 24 Hours Prior to Your Surgery.   ____ 4. Bring all medications with you on the day of surgery if instructed.    __X__ 5. Notify your doctor if there is any change in your medical condition     (cold, fever, infections).       Do not wear jewelry, make-up, hairpins, clips or nail polish.  Do not wear lotions, powders, or perfumes. You may wear deodorant.  Do not shave 48 hours prior to surgery. Men may shave face and neck.  Do not bring valuables to the hospital.    [facility] is not responsible for any belongings or valuables.               Contacts, dentures or bridgework may not be worn into surgery.  Leave your suitcase in the car. After surgery it may be brought to your room.  For patients admitted to the hospital, discharge time is determined by your                treatment team.   Patients discharged the day of surgery will not be allowed to drive home.   Please read over the following fact sheets that you were given:   MRSA Information   ____ Take these medicines the morning of surgery with A SIP OF WATER:    1. NONE  2.   3.   4.  5.  6.  ____ Fleet Enema (as directed)   __X__ Use CHG Soap as directed  ____ Use inhalers on the day of surgery  ____ Stop metformin 2 days prior to surgery    ____ Take 1/2 of usual insulin dose the night before surgery and none on the morning of surgery.    ____ Stop Coumadin/Plavix/aspirin on   ____ Stop Anti-inflammatories on  ____ Stop supplements until after surgery.    ____ Bring C-Pap to the hospital.

## 2016-02-10 NOTE — Progress Notes (Signed)
Pre visit review using our clinic review tool, if applicable. No additional management support is needed unless otherwise documented below in the visit note. 

## 2016-02-14 ENCOUNTER — Ambulatory Visit: Payer: BLUE CROSS/BLUE SHIELD | Admitting: Anesthesiology

## 2016-02-14 ENCOUNTER — Encounter
Admission: RE | Disposition: A | Discharge status: Home / Self Care | Payer: Self-pay | Source: Ambulatory Visit | Attending: Obstetrics and Gynecology

## 2016-02-14 ENCOUNTER — Ambulatory Visit
Admission: RE | Admit: 2016-02-14 | Discharge: 2016-02-14 | Disposition: A | Discharge status: Home / Self Care | Payer: BLUE CROSS/BLUE SHIELD | Source: Ambulatory Visit | Attending: Obstetrics and Gynecology | Admitting: Obstetrics and Gynecology

## 2016-02-14 ENCOUNTER — Encounter: Payer: Self-pay | Admitting: *Deleted

## 2016-02-14 DIAGNOSIS — N809 Endometriosis, unspecified: Secondary | ICD-10-CM | POA: Diagnosis not present

## 2016-02-14 DIAGNOSIS — Z90710 Acquired absence of both cervix and uterus: Secondary | ICD-10-CM | POA: Diagnosis not present

## 2016-02-14 DIAGNOSIS — N736 Female pelvic peritoneal adhesions (postinfective): Secondary | ICD-10-CM | POA: Diagnosis not present

## 2016-02-14 DIAGNOSIS — G8929 Other chronic pain: Secondary | ICD-10-CM | POA: Diagnosis not present

## 2016-02-14 DIAGNOSIS — R102 Pelvic and perineal pain: Secondary | ICD-10-CM | POA: Diagnosis not present

## 2016-02-14 HISTORY — PX: LAPAROSCOPY: SHX197

## 2016-02-14 SURGERY — LAPAROSCOPY, DIAGNOSTIC
Anesthesia: General | Wound class: Clean Contaminated

## 2016-02-14 MED ORDER — FENTANYL CITRATE (PF) 100 MCG/2ML IJ SOLN
INTRAMUSCULAR | Status: AC
  Administered 2016-02-14: 25 ug via INTRAVENOUS
  Filled 2016-02-14: qty 2

## 2016-02-14 MED ORDER — SCOPOLAMINE 1 MG/3DAYS TD PT72
1.0000 | MEDICATED_PATCH | TRANSDERMAL | Status: DC
  Administered 2016-02-14: 1.5 mg via TRANSDERMAL

## 2016-02-14 MED ORDER — HYDROMORPHONE HCL 1 MG/ML IJ SOLN
INTRAMUSCULAR | Status: AC
  Filled 2016-02-14: qty 1

## 2016-02-14 MED ORDER — OXYCODONE-ACETAMINOPHEN 5-325 MG PO TABS
1.0000 | ORAL_TABLET | Freq: Four times a day (QID) | ORAL | Status: DC | PRN
  Administered 2016-02-14: 1 via ORAL

## 2016-02-14 MED ORDER — OXYCODONE-ACETAMINOPHEN 5-325 MG PO TABS: 1.0000 | ORAL_TABLET | Freq: Four times a day (QID) | ORAL | 0 refills | Status: DC | PRN

## 2016-02-14 MED ORDER — LACTATED RINGERS IV SOLN
INTRAVENOUS | Status: DC
Start: 2016-02-14 — End: 2016-02-14

## 2016-02-14 MED ORDER — LIDOCAINE HCL (CARDIAC) 20 MG/ML IV SOLN
INTRAVENOUS | Status: DC | PRN
  Administered 2016-02-14: 100 mg via INTRAVENOUS

## 2016-02-14 MED ORDER — LACTATED RINGERS IV SOLN
INTRAVENOUS | Status: DC
  Administered 2016-02-14 (×2): via INTRAVENOUS

## 2016-02-14 MED ORDER — DOCUSATE SODIUM 100 MG PO CAPS: 100.0000 mg | ORAL_CAPSULE | Freq: Two times a day (BID) | ORAL | 2 refills | Status: DC | PRN

## 2016-02-14 MED ORDER — FAMOTIDINE 20 MG PO TABS
20.0000 mg | ORAL_TABLET | Freq: Once | ORAL | Status: AC
  Administered 2016-02-14: 20 mg via ORAL

## 2016-02-14 MED ORDER — PROPOFOL 10 MG/ML IV BOLUS
INTRAVENOUS | Status: DC | PRN
  Administered 2016-02-14: 180 mg via INTRAVENOUS

## 2016-02-14 MED ORDER — MIDAZOLAM HCL 2 MG/2ML IJ SOLN
INTRAMUSCULAR | Status: DC | PRN
  Administered 2016-02-14: 2 mg via INTRAVENOUS

## 2016-02-14 MED ORDER — SIMETHICONE 80 MG PO CHEW: 80.0000 mg | CHEWABLE_TABLET | Freq: Four times a day (QID) | ORAL | 0 refills | Status: DC | PRN

## 2016-02-14 MED ORDER — HYDROMORPHONE HCL 1 MG/ML IJ SOLN
0.2500 mg | INTRAMUSCULAR | Status: DC | PRN
  Administered 2016-02-14: 0.25 mg via INTRAVENOUS

## 2016-02-14 MED ORDER — FENTANYL CITRATE (PF) 100 MCG/2ML IJ SOLN
25.0000 ug | INTRAMUSCULAR | Status: AC | PRN
  Administered 2016-02-14 (×6): 25 ug via INTRAVENOUS

## 2016-02-14 MED ORDER — ROCURONIUM BROMIDE 100 MG/10ML IV SOLN
INTRAVENOUS | Status: DC | PRN
  Administered 2016-02-14: 10 mg via INTRAVENOUS
  Administered 2016-02-14: 40 mg via INTRAVENOUS

## 2016-02-14 MED ORDER — FENTANYL CITRATE (PF) 100 MCG/2ML IJ SOLN
INTRAMUSCULAR | Status: DC | PRN
  Administered 2016-02-14: 50 ug via INTRAVENOUS
  Administered 2016-02-14: 100 ug via INTRAVENOUS
  Administered 2016-02-14: 50 ug via INTRAVENOUS

## 2016-02-14 MED ORDER — FAMOTIDINE 20 MG PO TABS
ORAL_TABLET | ORAL | Status: AC
  Administered 2016-02-14: 20 mg via ORAL
  Filled 2016-02-14: qty 1

## 2016-02-14 MED ORDER — PROMETHAZINE HCL 25 MG/ML IJ SOLN: 6.2500 mg | INTRAMUSCULAR | Status: DC | PRN

## 2016-02-14 MED ORDER — ONDANSETRON HCL 4 MG/2ML IJ SOLN
INTRAMUSCULAR | Status: DC | PRN
  Administered 2016-02-14: 4 mg via INTRAVENOUS

## 2016-02-14 MED ORDER — DEXAMETHASONE SODIUM PHOSPHATE 10 MG/ML IJ SOLN
INTRAMUSCULAR | Status: DC | PRN
  Administered 2016-02-14: 10 mg via INTRAVENOUS

## 2016-02-14 MED ORDER — SUGAMMADEX SODIUM 200 MG/2ML IV SOLN
INTRAVENOUS | Status: DC | PRN
  Administered 2016-02-14: 200 mg via INTRAVENOUS

## 2016-02-14 MED ORDER — BUPIVACAINE HCL 0.5 % IJ SOLN
INTRAMUSCULAR | Status: DC | PRN
  Administered 2016-02-14: 9 mL

## 2016-02-14 MED ORDER — BUPIVACAINE HCL (PF) 0.5 % IJ SOLN
INTRAMUSCULAR | Status: AC
  Filled 2016-02-14: qty 30

## 2016-02-14 MED ORDER — SCOPOLAMINE 1 MG/3DAYS TD PT72
MEDICATED_PATCH | TRANSDERMAL | Status: AC
  Administered 2016-02-14: 1.5 mg via TRANSDERMAL
  Filled 2016-02-14: qty 1

## 2016-02-14 MED ORDER — OXYCODONE-ACETAMINOPHEN 5-325 MG PO TABS
ORAL_TABLET | ORAL | Status: AC
  Administered 2016-02-14: 1 via ORAL
  Filled 2016-02-14: qty 1

## 2016-02-14 SURGICAL SUPPLY — 34 items
BLADE SURG SZ11 CARB STEEL (BLADE) ×2 IMPLANT
CANISTER SUCT 1200ML W/VALVE (MISCELLANEOUS) ×2 IMPLANT
CATH ROBINSON RED A/P 16FR (CATHETERS) ×2 IMPLANT
CHLORAPREP W/TINT 26ML (MISCELLANEOUS) ×2 IMPLANT
CORD MONOPOLAR M/FML 12FT (MISCELLANEOUS) IMPLANT
GLOVE BIO SURGEON STRL SZ 6.5 (GLOVE) ×2 IMPLANT
GLOVE BIO SURGEON STRL SZ8 (GLOVE) ×2 IMPLANT
GLOVE INDICATOR 7.0 STRL GRN (GLOVE) ×2 IMPLANT
GLOVE INDICATOR 8.0 STRL GRN (GLOVE) ×2 IMPLANT
GOWN STRL REUS W/ TWL LRG LVL3 (GOWN DISPOSABLE) ×2 IMPLANT
GOWN STRL REUS W/TWL LRG LVL3 (GOWN DISPOSABLE) ×2
GOWN STRL REUS W/TWL XL LVL3 (GOWN DISPOSABLE) ×2 IMPLANT
IRRIGATION STRYKERFLOW (MISCELLANEOUS) ×1 IMPLANT
IRRIGATOR STRYKERFLOW (MISCELLANEOUS) ×2
IV LACTATED RINGERS 1000ML (IV SOLUTION) ×2 IMPLANT
KIT PINK PAD W/HEAD ARE REST (MISCELLANEOUS) ×2
KIT PINK PAD W/HEAD ARM REST (MISCELLANEOUS) ×1 IMPLANT
KIT RM TURNOVER CYSTO AR (KITS) ×2 IMPLANT
LIQUID BAND (GAUZE/BANDAGES/DRESSINGS) ×4 IMPLANT
NS IRRIG 500ML POUR BTL (IV SOLUTION) ×2 IMPLANT
PACK GYN LAPAROSCOPIC (MISCELLANEOUS) ×2 IMPLANT
PAD OB MATERNITY 4.3X12.25 (PERSONAL CARE ITEMS) ×2 IMPLANT
PAD PREP 24X41 OB/GYN DISP (PERSONAL CARE ITEMS) ×2 IMPLANT
POUCH ENDO CATCH 10MM SPEC (MISCELLANEOUS) IMPLANT
SCISSORS METZENBAUM CVD 33 (INSTRUMENTS) IMPLANT
SHEARS HARMONIC ACE PLUS 36CM (ENDOMECHANICALS) ×2 IMPLANT
SLEEVE ENDOPATH XCEL 5M (ENDOMECHANICALS) ×2 IMPLANT
SUT VIC AB 3-0 SH 27 (SUTURE)
SUT VIC AB 3-0 SH 27X BRD (SUTURE) IMPLANT
SUT VICRYL 0 AB UR-6 (SUTURE) ×2 IMPLANT
TROCAR ENDO BLADELESS 11MM (ENDOMECHANICALS) ×2 IMPLANT
TROCAR XCEL NON-BLD 5MMX100MML (ENDOMECHANICALS) ×2 IMPLANT
TROCAR XCEL UNIV SLVE 11M 100M (ENDOMECHANICALS) ×2 IMPLANT
TUBING INSUFFLATOR HI FLOW (MISCELLANEOUS) ×2 IMPLANT

## 2016-02-14 NOTE — Anesthesia Procedure Notes (Signed)
Procedure Name: Intubation Date/Time: 02/14/2016 3:11 PM Performed by: Stormy FabianURTIS, Jaedyn Lard Pre-anesthesia Checklist: Patient identified, Patient being monitored, Timeout performed, Emergency Drugs available and Suction available Patient Re-evaluated:Patient Re-evaluated prior to inductionOxygen Delivery Method: Circle system utilized Preoxygenation: Pre-oxygenation with 100% oxygen Intubation Type: IV induction Ventilation: Mask ventilation without difficulty Laryngoscope Size: Mac and 3 Grade View: Grade I Tube type: Oral Tube size: 7.0 mm Number of attempts: 1 Airway Equipment and Method: Stylet Placement Confirmation: ETT inserted through vocal cords under direct vision,  positive ETCO2 and breath sounds checked- equal and bilateral Secured at: 23 (at lip) cm Tube secured with: Tape Dental Injury: Teeth and Oropharynx as per pre-operative assessment

## 2016-02-14 NOTE — Anesthesia Preprocedure Evaluation (Signed)
Anesthesia Evaluation  Patient identified by MRN, date of birth, ID band Patient awake    Reviewed: Allergy & Precautions, NPO status , Patient's Chart, lab work & pertinent test results  History of Anesthesia Complications (+) PONV and history of anesthetic complications  Airway Mallampati: III       Dental  (+) Teeth Intact, Caps   Pulmonary neg shortness of breath, neg sleep apnea, neg COPD, Recent URI , Residual Cough,           Cardiovascular Exercise Tolerance: Good hypertension, Pt. on medications (-) angina(-) CAD, (-) Past MI, (-) Cardiac Stents and (-) CABG (-) dysrhythmias (-) Valvular Problems/Murmurs     Neuro/Psych negative neurological ROS     GI/Hepatic negative GI ROS, Neg liver ROS,   Endo/Other  negative endocrine ROS  Renal/GU negative Renal ROS     Musculoskeletal   Abdominal   Peds  Hematology  (+) anemia ,   Anesthesia Other Findings Past Medical History: No date: Abnormal Pap smear No date: Anxiety No date: Anxiety No date: Cough     Comment: HAS COLD. PRODUCTIVE CLEAR. NO FEVER. NO               WHEEZING. TO SEE PCP IF WORSENS No date: Endometriosis No date: HA (headache)     Comment: otc med prn No date: Hyperlipidemia     Comment: diet controlled, no meds No date: Hypertension No date: PONV (postoperative nausea and vomiting) 2008: Pregnancy induced hypertension   Reproductive/Obstetrics negative OB ROS                             Anesthesia Physical  Anesthesia Plan  ASA: II  Anesthesia Plan: General   Post-op Pain Management:    Induction: Intravenous  Airway Management Planned: Oral ETT  Additional Equipment:   Intra-op Plan:   Post-operative Plan:   Informed Consent: I have reviewed the patients History and Physical, chart, labs and discussed the procedure including the risks, benefits and alternatives for the proposed anesthesia  with the patient or authorized representative who has indicated his/her understanding and acceptance.     Plan Discussed with: Anesthesiologist, CRNA and Surgeon  Anesthesia Plan Comments:         Anesthesia Quick Evaluation

## 2016-02-14 NOTE — H&P (Signed)
UPDATE TO PREVIOUS HISTORY AND PHYSICAL  The patient has been seen and examined.  H&P is up to date, no changes noted.  Roena MaladyJennifer L Vickrey is a 35 y.o. [id number] here for surgical management of mild right sided pelvic pain mid to moderate left sided pelvic pain, and history of endometriosis.  Planned procedure is laparoscopic adhesiolysis and possible excision/fulguration of endometriosis.  Significant preoperative concerns: Patient has had laparoscopic supracervical hysterectomy (10/2013) and right oophorectomy, plus 4 other operative laparoscopies for lysis of adhesions/excision of endometriosis. In addition, has had 2 C-sections.  Patient aware of risks of injury to surrounding organs, bleeding, and need for further surgeries based on prior history.  All questions answered. Patient can proceed to the OR for scheduled procedure.   Hildred LaserAnika Jarrah Seher, MD 02/14/2016 2:51 PM

## 2016-02-14 NOTE — Transfer of Care (Signed)
Immediate Anesthesia Transfer of Care Note  Patient: Terri MaladyJennifer L Vanderwall  Procedure(s) Performed: Procedure(s): LAPAROSCOPY DIAGNOSTIC WITH BIOPSIES POSSIBLE ADHESIOLYSIS (N/A)  Patient Location: PACU  Anesthesia Type:General  Level of Consciousness: sedated  Airway & Oxygen Therapy: Patient Spontanous Breathing and Patient connected to face mask oxygen  Post-op Assessment: Report given to RN and Post -op Vital signs reviewed and stable  Post vital signs: Reviewed and stable  Last Vitals:  Vitals:   02/14/16 1306 02/14/16 1645  BP: (!) 160/112 132/78  Pulse: 91 76  Resp: 16 16  Temp: 36.6 C 36.5 C    Complications: No apparent anesthesia complications

## 2016-02-14 NOTE — Op Note (Signed)
Procedure(s): LAPAROSCOPY DIAGNOSTIC WITH BIOPSIES, EXCISION AND FULGURATION OF ENDOMETRIOSIS, AND ADHESIOLYSIS Procedure Note  Terri MaladyJennifer L Munoz female 35 y.o. 02/14/2016  Indications: The patient is a 35 y.o. [id number] female with chronic pelvic pain, h/o endometriosis, h/o multiple pelvic surgeries, prior hysterectomy with bilateral salpingectomy and left oophorectomy  Pre-operative Diagnosis: Chronic pelvic pain, h/o endometriosis, h/o multiple pelvic surgeries  Post-operative Diagnosis: Same with pelvic adhesions and recurrence of endometriosis  Surgeon: Hildred LaserAnika Gordon Carlson, MD  Assistants: None  Anesthesia: General endotracheal anesthesia  Findings: Exam under anesthesia: The cervical stump was visualized.  No nodularity palpable along vaginal sidewalls.  Laparoscopy: Several white powder-burn lesions along cervical cuff, cardinal ligaments and pelvic sidewalls bilaterally.  Flimy adhesions of bowel to pelvic sidewalls, bilaterally.  Normal appearing right ovary.   Procedure Details: The patient was seen in the Holding Room. The risks, benefits, complications, treatment options, and expected outcomes were discussed with the patient.  The patient concurred with the proposed plan, giving informed consent.  The site of surgery properly noted. The patient was taken to the Operating Room, identified as Terri MaladyJennifer L Munoz and the procedure verified as Procedure(s) (LRB): LAPAROSCOPY DIAGNOSTIC WITH BIOPSIES POSSIBLE ADHESIOLYSIS (N/A).   She was then placed under general anesthesia without difficulty. She was placed in the dorsal lithotomy position, and was prepped and draped in a sterile manner.  A straight catheterization was performed. A sterile speculum was inserted into the vagina and the cervix was visualized.  The speculum was then removed.  A sponge stick was placed for cervical stump manipulation. After an adequate timeout was performed, attention was turned to the abdomen where an  umbilical incision was made with the scalpel.  The Optiview 5-mm trocar and sleeve were then advanced without difficulty with the laparoscope under direct visualization into the abdomen.  The abdomen was then insufflated with carbon dioxide gas and adequate pneumoperitoneum was obtained. A 5-mm left right quadrant port was then placed under direct visualization.  A survey of the patient's pelvis and abdomen revealed the findings as above.  Several white powder burn lesions noted along cervical stump, cardinal ligaments bilaterally, and pelvic sidewalls near ovarian fossa bilaterally.  Biopsies were taken at each of these sights.   The areas were then cauterized using the Kleppingers. Hemostasis was noted.   Attention was then turned to the pelvic sidewalls where the Harmonic device was used to perform adhesiolysis of the filmy adhesions of the bowel to pelvic sidewall.   A final survey was performed, good hemostasis was noted. Aspiration of small collection of blood was performed.  After this, all trocars were removed under direct visualization, and the abdomen which was desufflated.    All skin incisions were injected with 0.5% Sensorcaine (a total of 9 ml used), and closed with Dermabond. The patient tolerated the procedures well.  All instruments, needles, and sponge counts were correct x 2. The patient was taken to the recovery room awake, extubated and in stable condition.    Estimated Blood Loss:  20 ml      Drains: straight catheterization prior to procedure with  10 ml of clear urine         Total IV Fluids:  900 ml  Specimens: cervical stump biopsies, cardinal ligament biopsies (bilateral) and pelvic sidewall biopsies (bilateral)         Implants: None         Complications:  None; patient tolerated the procedure well.         Disposition: PACU -  hemodynamically stable.         Condition: stable   Rubie Maid, MD [facility]

## 2016-02-14 NOTE — Anesthesia Postprocedure Evaluation (Signed)
Anesthesia Post Note  Patient: [name]  Procedure(s) Performed: Procedure(s) (LRB): LAPAROSCOPY DIAGNOSTIC WITH BIOPSIES POSSIBLE ADHESIOLYSIS (N/A)  Patient location during evaluation: PACU Anesthesia Type: General Level of consciousness: awake and alert Pain management: pain level controlled Vital Signs Assessment: post-procedure vital signs reviewed and stable Respiratory status: spontaneous breathing and respiratory function stable Cardiovascular status: stable Anesthetic complications: no    Last Vitals:  Vitals:   02/14/16 1657 02/14/16 1702  BP: (!) 152/102 (!) 164/100  Pulse: 76 79  Resp: 15 20  Temp:      Last Pain:  Vitals:   02/14/16 1702  TempSrc:   PainSc: 8                  Shallon Yaklin K

## 2016-02-14 NOTE — Discharge Instructions (Signed)
General Gynecological Post-Operative Instructions °You may expect to feel dizzy, weak, and drowsy for as long as 24 hours after receiving the medicine that made you sleep (anesthetic).  °Do not drive a car, ride a bicycle, participate in physical activities, or take public transportation until you are done taking narcotic pain medicines or as directed by your doctor.  °Do not drink alcohol or take tranquilizers.  °Do not take medicine that has not been prescribed by your doctor.  °Do not sign important papers or make important decisions while on narcotic pain medicines.  °Have a responsible person with you.  °CARE OF INCISION  °Keep incision clean and dry. °Take showers instead of baths until your doctor gives you permission to take baths.  °Avoid heavy lifting (more than 10 pounds/4.5 kilograms), pushing, or pulling.  °Avoid activities that may risk injury to your surgical site.  °No sexual intercourse or placement of anything in the vagina for 2 weeks or as instructed by your doctor. °If you have tubes coming from the wound site, check with your doctor regarding appropriate care of the tubes. °Only take prescription or over-the-counter medicines  for pain, discomfort, or fever as directed by your doctor. Do not take aspirin. It can make you bleed. Take medicines (antibiotics) that kill germs if they are prescribed for you.  °Call the office or go to the MAU if:  °You feel sick to your stomach (nauseous).  °You start to throw up (vomit).  °You have trouble eating or drinking.  °You have an oral temperature above 101.  °You have constipation that is not helped by adjusting diet or increasing fluid intake. Pain medicines are a common cause of constipation.  °You have any other concerns. °SEEK IMMEDIATE MEDICAL CARE IF:  °You have persistent dizziness.  °You have difficulty breathing or a congested sounding (croupy) cough.  °You have an oral temperature above 102.5, not controlled by medicine.  °There is increasing  pain or tenderness near or in the surgical site.  ° °AMBULATORY SURGERY  °DISCHARGE INSTRUCTIONS ° ° °1) The drugs that you were given will stay in your system until tomorrow so for the next 24 hours you should not: ° °A) Drive an automobile °B) Make any legal decisions °C) Drink any alcoholic beverage ° ° °2) You may resume regular meals tomorrow.  Today it is better to start with liquids and gradually work up to solid foods. ° °You may eat anything you prefer, but it is better to start with liquids, then soup and crackers, and gradually work up to solid foods. ° ° °3) Please notify your doctor immediately if you have any unusual bleeding, trouble breathing, redness and pain at the surgery site, drainage, fever, or pain not relieved by medication. ° ° ° °4) Additional Instructions: ° ° ° ° ° ° ° °Please contact your physician with any problems or Same Day Surgery at [phone], Monday through Friday 6 am to 4 pm, or [facility] Main number at [phone]. °

## 2016-02-14 NOTE — Progress Notes (Signed)
Dr Henrene Hawkingkephart called about high blood pressure 164/100  No new orders

## 2016-02-15 ENCOUNTER — Encounter: Payer: Self-pay | Admitting: Obstetrics and Gynecology

## 2016-02-16 ENCOUNTER — Encounter: Payer: Self-pay | Admitting: Obstetrics and Gynecology

## 2016-02-16 LAB — SURGICAL PATHOLOGY

## 2016-02-17 ENCOUNTER — Other Ambulatory Visit: Payer: Self-pay | Admitting: Obstetrics and Gynecology

## 2016-02-17 ENCOUNTER — Encounter: Payer: Self-pay | Admitting: Obstetrics and Gynecology

## 2016-02-17 MED ORDER — FLUCONAZOLE 150 MG PO TABS: 150.0000 mg | ORAL_TABLET | Freq: Once | ORAL | 3 refills | Status: AC

## 2016-02-17 MED ORDER — LIDOCAINE HCL 2 % EX GEL: 1.0000 "application " | CUTANEOUS | 2 refills | Status: DC | PRN

## 2016-02-17 NOTE — Progress Notes (Signed)
dif

## 2016-02-21 ENCOUNTER — Encounter: Payer: Self-pay | Admitting: Obstetrics and Gynecology

## 2016-02-21 ENCOUNTER — Other Ambulatory Visit: Payer: Self-pay | Admitting: Obstetrics and Gynecology

## 2016-02-21 MED ORDER — OXYCODONE-ACETAMINOPHEN 5-325 MG PO TABS: 1.0000 | ORAL_TABLET | Freq: Four times a day (QID) | ORAL | 0 refills | Status: DC | PRN

## 2016-02-22 ENCOUNTER — Encounter: Payer: Self-pay | Admitting: Obstetrics and Gynecology

## 2016-02-24 ENCOUNTER — Ambulatory Visit (INDEPENDENT_AMBULATORY_CARE_PROVIDER_SITE_OTHER): Payer: BLUE CROSS/BLUE SHIELD | Admitting: Obstetrics and Gynecology

## 2016-02-24 ENCOUNTER — Encounter: Payer: Self-pay | Admitting: Obstetrics and Gynecology

## 2016-02-24 VITALS — BP 131/84 | HR 102 | Resp 16 | Wt 215.0 lb

## 2016-02-24 DIAGNOSIS — R300 Dysuria: Secondary | ICD-10-CM | POA: Diagnosis not present

## 2016-02-24 DIAGNOSIS — G8929 Other chronic pain: Secondary | ICD-10-CM

## 2016-02-24 DIAGNOSIS — R102 Pelvic and perineal pain: Secondary | ICD-10-CM

## 2016-02-24 DIAGNOSIS — Z4889 Encounter for other specified surgical aftercare: Secondary | ICD-10-CM

## 2016-02-24 DIAGNOSIS — Z9889 Other specified postprocedural states: Secondary | ICD-10-CM

## 2016-02-24 DIAGNOSIS — N809 Endometriosis, unspecified: Secondary | ICD-10-CM

## 2016-02-24 LAB — POCT URINALYSIS DIPSTICK
Bilirubin, UA: NEGATIVE
Blood, UA: NEGATIVE
Glucose, UA: NEGATIVE
Ketones, UA: NEGATIVE
Leukocytes, UA: NEGATIVE
Nitrite, UA: NEGATIVE
Protein, UA: NEGATIVE
Spec Grav, UA: <=1.005
Urobilinogen, UA: 0.2
pH, UA: 7.5

## 2016-02-24 MED ORDER — PHENAZOPYRIDINE HCL 200 MG PO TABS: 200.0000 mg | ORAL_TABLET | Freq: Three times a day (TID) | ORAL | 1 refills | Status: DC | PRN

## 2016-02-24 NOTE — Progress Notes (Signed)
OBSTETRICS/GYNECOLOGY POST-OPERATIVE CLINIC VISIT  Subjective:     [name] is a 35 y.o. female who presents to the clinic 1 week status post laparoscopy with biopsies and lysis of adhesions for pelvic pain and h/o endometriosis. Eating a regular diet without difficulty. Bowel movements are normal. Pain is controlled with current analgesics. Medications being used: prescription NSAID's including ibuprofen (Motrin) and narcotic analgesics including oxycodone/acetaminophen (Percocet, Tylox).  Also using Tramadol.  Notes that she resumed her medications for the endometriosis.  Still notes soreness at one of her incision sites.  Has been using lidocaine gel to region with some relief. Also notes moderate bruising around right incision site.   The following portions of the patient's history were reviewed and updated as appropriate: allergies, current medications, past family history, past medical history, past social history, past surgical history and problem list.  Review of Systems Genitourinary:positive for dysuria and vaginal irritation Patient notes she thought she had a yeast infection, and has treated with 2 rounds of Diflucan. States that it helped short term but symptoms returned, but then evolved into urinary symptoms.    Objective:    BP 131/84 (BP Location: Left Arm, Patient Position: Sitting, Cuff Size: Large)   Pulse (!) 102   Resp 16   Wt 215 lb (97.5 kg)   LMP 10/15/2013   BMI 35.78 kg/m  General:  alert and no distress  Abdomen: soft, bowel sounds active, non-tender  Incision:   healing well, no drainage, no erythema, no hernia, no seroma, no swelling, no dehiscence, incision well approximated.  Moderate bruising noted around right laparoscopic port site, with tenderness.    Pathology:  DIAGNOSIS:  A. CARDINAL LIGAMENT, RIGHT; BIOPSY:  - FIBROSIS, FOCAL CALCIFICATION, AND A RARE BIREFRINGENT PARTICLE OF FOREIGN MATERIAL.   B. CARDINAL LIGAMENT, LEFT; BIOPSY:    - FIBROSIS AND RARE PIGMENTED PARTICLES.   C. ABDOMINAL SIDE WALL, LEFT; BIOPSY:  - FIBROSIS, RARE PIGMENTED PARTICLES, AND A FOREIGN-BODY GIANT CELL  CONTAINING A BIREFRINGENT PARTICLE.   D. ABDOMINAL SIDE WALL, RIGHT; BIOPSY:  - FIBROSIS.   E. CERVICAL STUMP; BIOPSY:  - FIBROSIS AND RARE PIGMENTED PARTICLES.      Results for orders placed or performed in visit on 02/24/16  POCT urinalysis dipstick  Result Value Ref Range   Color, UA yellow    Clarity, UA clear    Glucose, UA neg    Bilirubin, UA neg    Ketones, UA neg    Spec Grav, UA <=1.005    Blood, UA neg    pH, UA 7.5    Protein, UA neg    Urobilinogen, UA 0.2    Nitrite, UA neg    Leukocytes, UA Negative Negative    Assessment:    Postoperative course complicated by post-operative pain at right port incision site  H/o endometriosis H/o chronic pelvic pain  Dysuria  Plan:   1. Continue any current medications.  Refills given last week.  2. Wound care discussed. 3. Operative findings reviewed. Pathology report discussed. 4. Activity restrictions: no bending, stooping, or squatting and no lifting more than 15 pounds or an additional week.  5. Anticipated return to work: not applicable. 6. Dysuria, patient with normal UA.  Advised on adequate hydration. Has been using Azo with minimal relief.  Will switch to Pyridium.  7. Follow up: 2-3 months for f/u of medications for endometriosis (currently on Danazole and Aygestin, will have been on for 6 months by next visit).  If symptoms  have returned or not improved, can switch again to different management (Depo Provera or Lupron injections, both of which patient has been on in the past, worked for a while but eventually became less effecive).     Hildred LaserAnika Teondre Jarosz, MD [facility]

## 2016-02-26 LAB — URINE CULTURE

## 2016-02-28 ENCOUNTER — Encounter: Payer: Self-pay | Admitting: Obstetrics and Gynecology

## 2016-02-28 ENCOUNTER — Encounter: Payer: Self-pay | Admitting: Family Medicine

## 2016-02-28 ENCOUNTER — Other Ambulatory Visit: Payer: Self-pay | Admitting: Internal Medicine

## 2016-02-28 MED ORDER — BENZONATATE 200 MG PO CAPS: 200.0000 mg | ORAL_CAPSULE | Freq: Two times a day (BID) | ORAL | 0 refills | Status: DC | PRN

## 2016-02-28 NOTE — Telephone Encounter (Signed)
Pt called in; since having surgery pt has developed more congestion, sneezing and non prod cough. Pt wants med called in today for cough; R Baity NP advised would send in tessalon perle and if needed more than that needs to be seen. Pt said she already has tessalon perles and they are not helping cough; pt does not want to go to UC. Pt scheduled appt with Terri GitelmanK Clark NP on 02/29/16 at 9:45 with Terri ReelKate Clark NP. If condition worsens tonight will go to UC> FYI to Terri ReelKate Clark NP.

## 2016-02-29 ENCOUNTER — Ambulatory Visit: Payer: BLUE CROSS/BLUE SHIELD | Admitting: Primary Care

## 2016-03-01 ENCOUNTER — Telehealth: Payer: Self-pay | Admitting: Family Medicine

## 2016-03-01 DIAGNOSIS — B9789 Other viral agents as the cause of diseases classified elsewhere: Principal | ICD-10-CM

## 2016-03-01 DIAGNOSIS — J069 Acute upper respiratory infection, unspecified: Secondary | ICD-10-CM

## 2016-03-01 MED ORDER — HYDROCODONE-HOMATROPINE 5-1.5 MG/5ML PO SYRP: 5.0000 mL | ORAL_SOLUTION | Freq: Three times a day (TID) | ORAL | 0 refills | Status: DC | PRN

## 2016-03-01 NOTE — Telephone Encounter (Signed)
Spoke with patient who reports continued sinus pressure, chest congestion, cough. She is having clear runny nasal drainage alternating with nasal congestion. She is having small amount clear sputum. She is currently taking antihistamine, afrin, alka seltzer cold and flu. Feels worn out. Had surgery 02/14/16. No fever. Not sleeping well due to cough. Children and husband have been ill for several weeks, too. Today is her first day back to work following surgery. Discussed symptom management, doesn't sound bacterial, will treat cough, see if she improves with a couple nights rest. She was instructed to let me know if she develops new or worsening symptoms.

## 2016-03-02 MED ORDER — AMOXICILLIN-POT CLAVULANATE 875-125 MG PO TABS: 1.0000 | ORAL_TABLET | Freq: Two times a day (BID) | ORAL | 0 refills | Status: DC

## 2016-03-02 NOTE — Addendum Note (Signed)
Addended by: Olean ReeGESSNER, Annalina Needles B on: 03/02/2016 02:42 PM   Modules accepted: Orders

## 2016-03-02 NOTE — Telephone Encounter (Signed)
Called and spoke with patient who reports grey sputum and increased sinus pressure. Some relief of cough with additional cough syrup. Feels terrible today, increased fatigue. Discussed antibiotic use and sent in prescription for augmentin. Patient with possible rash with amoxicillin in past and with multiple drug allergies/intolerences. She thinks she took augmentin in past and is not clear that rash definitely related to amoxicillin. She stop antibiotic and notify office if any reactions.

## 2016-03-02 NOTE — Telephone Encounter (Signed)
Pt left voicemail at Triage, she said her sxs have changed but she wants to speak directly to Deboraha Sprangebbie Gessner, NP to advise her of what's going on because they have been talking already about her sxs, if you are able please call pt back

## 2016-03-14 ENCOUNTER — Encounter: Payer: Self-pay | Admitting: Obstetrics and Gynecology

## 2016-03-15 ENCOUNTER — Ambulatory Visit: Payer: BLUE CROSS/BLUE SHIELD | Admitting: Obstetrics and Gynecology

## 2016-03-15 ENCOUNTER — Other Ambulatory Visit: Payer: Self-pay | Admitting: Obstetrics and Gynecology

## 2016-03-15 MED ORDER — TRAMADOL HCL 50 MG PO TABS: 50.0000 mg | ORAL_TABLET | Freq: Four times a day (QID) | ORAL | 3 refills | Status: DC | PRN

## 2016-03-23 ENCOUNTER — Encounter: Payer: Self-pay | Admitting: Obstetrics and Gynecology

## 2016-03-24 ENCOUNTER — Other Ambulatory Visit: Payer: Self-pay | Admitting: Obstetrics and Gynecology

## 2016-03-24 MED ORDER — OXYCODONE-ACETAMINOPHEN 5-325 MG PO TABS: 1.0000 | ORAL_TABLET | Freq: Four times a day (QID) | ORAL | 0 refills | Status: DC | PRN

## 2016-03-27 ENCOUNTER — Other Ambulatory Visit: Payer: Self-pay | Admitting: Obstetrics and Gynecology

## 2016-03-30 ENCOUNTER — Ambulatory Visit (INDEPENDENT_AMBULATORY_CARE_PROVIDER_SITE_OTHER): Payer: BLUE CROSS/BLUE SHIELD | Admitting: Family Medicine

## 2016-03-30 ENCOUNTER — Encounter: Payer: Self-pay | Admitting: Family Medicine

## 2016-03-30 ENCOUNTER — Encounter: Payer: Self-pay | Admitting: *Deleted

## 2016-03-30 VITALS — BP 138/84 | HR 118 | Temp 99.7°F | Ht 65.0 in | Wt 220.0 lb

## 2016-03-30 DIAGNOSIS — J020 Streptococcal pharyngitis: Secondary | ICD-10-CM

## 2016-03-30 DIAGNOSIS — J029 Acute pharyngitis, unspecified: Secondary | ICD-10-CM | POA: Diagnosis not present

## 2016-03-30 LAB — POCT RAPID STREP A (OFFICE): Rapid Strep A Screen: POSITIVE — AB

## 2016-03-30 MED ORDER — AMOXICILLIN-POT CLAVULANATE 875-125 MG PO TABS: 1.0000 | ORAL_TABLET | Freq: Two times a day (BID) | ORAL | 0 refills | Status: DC

## 2016-03-30 NOTE — Assessment & Plan Note (Signed)
Pt has recently tolerated augmentin in last year for different issue.  Will treat with augmentin x 10 days.  Symptom care.

## 2016-03-30 NOTE — Progress Notes (Signed)
Pre visit review using our clinic review tool, if applicable. No additional management support is needed unless otherwise documented below in the visit note. 

## 2016-03-30 NOTE — Progress Notes (Signed)
   Subjective:    Patient ID: [name], female    DOB: 20-Nov-1980, 35 y.o.   MRN: [id number]  Sore Throat   This is a new problem. The current episode started yesterday. The pain is worse on the right side. The maximum temperature recorded prior to her arrival was 100.4 - 100.9 F. The pain is moderate. Associated symptoms include headaches, swollen glands and trouble swallowing. Pertinent negatives include no congestion, coughing, drooling or shortness of breath. Associated symptoms comments: Fatigued  body ache  chills. She has tried NSAIDs for the symptoms. The treatment provided mild relief.  Fever   Associated symptoms include headaches. Pertinent negatives include no congestion or coughing.  Headache   Associated symptoms include a fever and swollen glands. Pertinent negatives include no coughing.   She has tolerated augmentin in past ? Viral rash on amox in past.  Review of Systems  Constitutional: Positive for fever.  HENT: Positive for trouble swallowing. Negative for congestion and drooling.   Respiratory: Negative for cough and shortness of breath.   Neurological: Positive for headaches.       Objective:   Physical Exam  Constitutional: Vital signs are normal. She appears well-developed and well-nourished. She is cooperative.  Non-toxic appearance. She does not appear ill. No distress.  HENT:  Head: Normocephalic.  Right Ear: Hearing, tympanic membrane, external ear and ear canal normal. Tympanic membrane is not erythematous, not retracted and not bulging.  Left Ear: Hearing, tympanic membrane, external ear and ear canal normal. Tympanic membrane is not erythematous, not retracted and not bulging.  Nose: No mucosal edema or rhinorrhea. Right sinus exhibits no maxillary sinus tenderness and no frontal sinus tenderness. Left sinus exhibits no maxillary sinus tenderness and no frontal sinus tenderness.  Mouth/Throat: Uvula is midline and mucous membranes are normal.  Posterior oropharyngeal edema and posterior oropharyngeal erythema present. No oropharyngeal exudate or tonsillar abscesses.  Eyes: Conjunctivae, EOM and lids are normal. Pupils are equal, round, and reactive to light. Lids are everted and swept, no foreign bodies found.  Neck: Trachea normal and normal range of motion. Neck supple. Carotid bruit is not present. No thyroid mass and no thyromegaly present.  Cardiovascular: Normal rate, regular rhythm, S1 normal, S2 normal, normal heart sounds, intact distal pulses and normal pulses.  Exam reveals no gallop and no friction rub.   No murmur heard. Pulmonary/Chest: Effort normal and breath sounds normal. No tachypnea. No respiratory distress. She has no decreased breath sounds. She has no wheezes. She has no rhonchi. She has no rales.  Neurological: She is alert.  Skin: Skin is warm, dry and intact. No rash noted.  Psychiatric: Her speech is normal and behavior is normal. Judgment normal. Her mood appears not anxious. Cognition and memory are normal. She does not exhibit a depressed mood.          Assessment & Plan:

## 2016-03-30 NOTE — Patient Instructions (Addendum)
Complete antibiotics.  Ibuprofen 800 mg every 8 hours.  Push fluids as much as possible.  Strep Throat Strep throat is a bacterial infection of the throat. Your health care provider may call the infection tonsillitis or pharyngitis, depending on whether there is swelling in the tonsils or at the back of the throat. Strep throat is most common during the cold months of the year in children who are 455-34 years of age, but it can happen during any season in people of any age. This infection is spread from person to person (contagious) through coughing, sneezing, or close contact. What are the causes? Strep throat is caused by the bacteria called Streptococcus pyogenes. What increases the risk? This condition is more likely to develop in:  People who spend time in crowded places where the infection can spread easily.  People who have close contact with someone who has strep throat. What are the signs or symptoms? Symptoms of this condition include:  Fever or chills.  Redness, swelling, or pain in the tonsils or throat.  Pain or difficulty when swallowing.  White or yellow spots on the tonsils or throat.  Swollen, tender glands in the neck or under the jaw.  Red rash all over the body (rare). How is this diagnosed? This condition is diagnosed by performing a rapid strep test or by taking a swab of your throat (throat culture test). Results from a rapid strep test are usually ready in a few minutes, but throat culture test results are available after one or two days. How is this treated? This condition is treated with antibiotic medicine. Follow these instructions at home: Medicines  Take over-the-counter and prescription medicines only as told by your health care provider.  Take your antibiotic as told by your health care provider. Do not stop taking the antibiotic even if you start to feel better.  Have family members who also have a sore throat or fever tested for strep throat.  They may need antibiotics if they have the strep infection. Eating and drinking  Do not share food, drinking cups, or personal items that could cause the infection to spread to other people.  If swallowing is difficult, try eating soft foods until your sore throat feels better.  Drink enough fluid to keep your urine clear or pale yellow. General instructions  Gargle with a salt-water mixture 3-4 times per day or as needed. To make a salt-water mixture, completely dissolve -1 tsp of salt in 1 cup of warm water.  Make sure that all household members wash their hands well.  Get plenty of rest.  Stay home from school or work until you have been taking antibiotics for 24 hours.  Keep all follow-up visits as told by your health care provider. This is important. Contact a health care provider if:  The glands in your neck continue to get bigger.  You develop a rash, cough, or earache.  You cough up a thick liquid that is green, yellow-brown, or bloody.  You have pain or discomfort that does not get better with medicine.  Your problems seem to be getting worse rather than better.  You have a fever. Get help right away if:  You have new symptoms, such as vomiting, severe headache, stiff or painful neck, chest pain, or shortness of breath.  You have severe throat pain, drooling, or changes in your voice.  You have swelling of the neck, or the skin on the neck becomes red and tender.  You have signs of  dehydration, such as fatigue, dry mouth, and decreased urination.  You become increasingly sleepy, or you cannot wake up completely.  Your joints become red or painful. This information is not intended to replace advice given to you by your health care provider. Make sure you discuss any questions you have with your health care provider. Document Released: 03/31/2000 Document Revised: 12/01/2015 Document Reviewed: 07/27/2014 Elsevier Interactive Patient Education  2017 [employer]

## 2016-04-03 ENCOUNTER — Other Ambulatory Visit: Payer: Self-pay | Admitting: Obstetrics and Gynecology

## 2016-04-03 ENCOUNTER — Encounter: Payer: Self-pay | Admitting: Obstetrics and Gynecology

## 2016-04-03 MED ORDER — ZOLPIDEM TARTRATE 5 MG PO TABS: ORAL_TABLET | ORAL | 2 refills | Status: DC

## 2016-04-06 ENCOUNTER — Encounter: Payer: Self-pay | Admitting: Obstetrics and Gynecology

## 2016-04-19 ENCOUNTER — Encounter: Payer: Self-pay | Admitting: Obstetrics and Gynecology

## 2016-04-20 ENCOUNTER — Other Ambulatory Visit: Payer: Self-pay | Admitting: Obstetrics and Gynecology

## 2016-04-20 MED ORDER — OXYCODONE-ACETAMINOPHEN 5-325 MG PO TABS: 1.0000 | ORAL_TABLET | Freq: Four times a day (QID) | ORAL | 0 refills | Status: DC | PRN

## 2016-04-25 ENCOUNTER — Encounter: Payer: BLUE CROSS/BLUE SHIELD | Admitting: Obstetrics and Gynecology

## 2016-05-05 ENCOUNTER — Ambulatory Visit (INDEPENDENT_AMBULATORY_CARE_PROVIDER_SITE_OTHER): Payer: BLUE CROSS/BLUE SHIELD | Admitting: Family Medicine

## 2016-05-05 ENCOUNTER — Encounter: Payer: Self-pay | Admitting: Family Medicine

## 2016-05-05 VITALS — BP 160/110 | HR 83 | Temp 98.3°F | Resp 20 | Wt 218.8 lb

## 2016-05-05 DIAGNOSIS — I10 Essential (primary) hypertension: Secondary | ICD-10-CM

## 2016-05-05 DIAGNOSIS — J069 Acute upper respiratory infection, unspecified: Secondary | ICD-10-CM | POA: Diagnosis not present

## 2016-05-05 DIAGNOSIS — H9202 Otalgia, left ear: Secondary | ICD-10-CM | POA: Diagnosis not present

## 2016-05-05 MED ORDER — HYDROCODONE-HOMATROPINE 5-1.5 MG/5ML PO SYRP
5.0000 mL | ORAL_SOLUTION | Freq: Three times a day (TID) | ORAL | 0 refills | Status: DC | PRN
Start: 2016-05-05 — End: 2016-05-12

## 2016-05-05 NOTE — Patient Instructions (Addendum)
Take an over the counter antihistamine (generic is fine) for the next 7-10 days  Use Afrin type nasal spray twice a day for up to 4 days then stop  Rest and drink enough fluids to make your urine light yellow  Please take your blood pressure when you are feeling better, let me or Dr. Alphonsus SiasLetvak know if it is over 140 on top or 90 on the bottom.

## 2016-05-05 NOTE — Progress Notes (Signed)
Subjective:    Patient ID: [name], female    DOB: 1980-06-16, 36 y.o.   MRN: [id number]  HPI This is a 36 yo female, accompanied by her two daughters, who presents today with 2-3 days of itchy, scratchy throat, nasal congestion. Constant nasal drainage,dry cough. Yesterday started a multi symptom cold medicine that she took last night and this morning with some improvement. Left ear started to hurt last night. Used some essential oils that helped a little. Took some ibuprofen last night. Ear not as bad today. Some sinus pressure. Took her blood pressure medicine about 1 hour ago. Took cold medicine containing decongestant this morning. No fever. Husband and children with cold symptoms. No SOB, no wheeze, no myalgias.   Past Medical History:  Diagnosis Date  . Abnormal Pap smear   . Anxiety   . Anxiety   . Cough    HAS COLD. PRODUCTIVE CLEAR. NO FEVER. NO WHEEZING. TO SEE PCP IF WORSENS  . Endometriosis   . HA (headache)    otc med prn  . Hyperlipidemia    diet controlled, no meds  . Hypertension   . PONV (postoperative nausea and vomiting)   . Pregnancy induced hypertension 2008   Past Surgical History:  Procedure Laterality Date  . ABDOMINAL HYSTERECTOMY  7/15   for endometriosis  . ABLATION ON ENDOMETRIOSIS N/A 06/04/2014   Procedure: ABLATION ON ENDOMETRIOSIS;  Surgeon: Leslie AndreaJames E Tomblin II, MD;  Location: [facility];  Service: Gynecology;  Laterality: N/A;  . BIOPSY  12/01/2011   Procedure: BIOPSY;  Surgeon: Leslie AndreaJames E Tomblin II, MD;  Location: [facility];  Service: Gynecology;  Laterality: N/A;  . CESAREAN SECTION  2008  . CESAREAN SECTION WITH BILATERAL TUBAL LIGATION N/A 12/02/2012   Procedure: Repeat Cesarean Section Delivery Baby Girl @ 1826, Apgars 8/9, Bilateral Tubal Ligation;  Surgeon: Leslie AndreaJames E Tomblin II, MD;  Location: [facility];  Service: Obstetrics;  Laterality: N/A;  . COLPOSCOPY     as teenager, results WNL  . ENDOMETRIAL BIOPSY    . LAPAROSCOPIC BILATERAL  SALPINGECTOMY Bilateral 02/15/2015   Procedure: LAPAROSCOPIC BILATERAL SALPINGECTOMY;  Surgeon: Hildred LaserAnika Cherry, MD;  Location: [facility];  Service: Gynecology;  Laterality: Bilateral;  . LAPAROSCOPIC LYSIS OF ADHESIONS N/A 06/04/2014   Procedure: LAPAROSCOPIC LYSIS OF ADHESIONS;  Surgeon: Leslie AndreaJames E Tomblin II, MD;  Location: [facility];  Service: Gynecology;  Laterality: N/A;  . LAPAROSCOPY  12/01/2011   Procedure: LAPAROSCOPY OPERATIVE;  Surgeon: Leslie AndreaJames E Tomblin II, MD;  Location: [facility];  Service: Gynecology;  Laterality: N/A;  . LAPAROSCOPY N/A 06/04/2014   Procedure: LAPAROSCOPY OPERATIVE;  Surgeon: Leslie AndreaJames E Tomblin II, MD;  Location: [facility];  Service: Gynecology;  Laterality: N/A;  . LAPAROSCOPY N/A 02/14/2016   Procedure: LAPAROSCOPY DIAGNOSTIC WITH BIOPSIES POSSIBLE ADHESIOLYSIS;  Surgeon: Hildred LaserAnika Cherry, MD;  Location: [facility];  Service: Gynecology;  Laterality: N/A;  . OOPHORECTOMY Left   . SALPINGECTOMY Bilateral   . TONSILLECTOMY    . TONSILLECTOMY    . TUBAL LIGATION    . TYMPANOSTOMY TUBE PLACEMENT     Family History  Problem Relation Age of Onset  . Hypertension Father   . Coronary artery disease Paternal Uncle   . Cancer Paternal Grandfather   . Diabetes Neg Hx       Review of Systems Per HPI    Objective:   Physical Exam  Constitutional: She is oriented to person, place, and time. She appears well-developed and well-nourished. No distress.  HENT:  Head: Normocephalic and atraumatic.  Right Ear: Tympanic membrane, external ear and ear canal normal.  Left Ear: External ear and ear canal normal. Tympanic membrane is not injected and not erythematous. A middle ear effusion is present.  Nose: Mucosal edema and rhinorrhea present. Right sinus exhibits no maxillary sinus tenderness and no frontal sinus tenderness. Left sinus exhibits no maxillary sinus tenderness and no frontal sinus tenderness.  Eyes: Conjunctivae are normal.  Neck: Normal range of motion. Neck supple.    Cardiovascular: Normal rate, regular rhythm and normal heart sounds.   Pulmonary/Chest: Effort normal and breath sounds normal.  Lymphadenopathy:    She has no cervical adenopathy.  Neurological: She is alert and oriented to person, place, and time.  Skin: Skin is warm and dry. She is not diaphoretic.  Psychiatric: She has a normal mood and affect. Her behavior is normal. Judgment and thought content normal.  Vitals reviewed.     BP (!) 160/110 (BP Location: Left Arm, Patient Position: Sitting, Cuff Size: Normal)   Pulse 83   Temp 98.3 F (36.8 C) (Oral)   Resp 20   Wt 218 lb 12.8 oz (99.2 kg)   LMP 10/15/2013   SpO2 98%   BMI 36.41 kg/m  Wt Readings from Last 3 Encounters:  05/05/16 218 lb 12.8 oz (99.2 kg)  03/30/16 220 lb (99.8 kg)  02/24/16 215 lb (97.5 kg)   BP Readings from Last 3 Encounters:  05/05/16 (!) 160/110  03/30/16 138/84  02/24/16 131/84   Recheck blood pressure- 152/102    Assessment & Plan:  1. Left ear pain - otc analgesics, antihistamines  2. Upper respiratory infection with cough and congestion - likely viral, rest, fluids, see #1 - HYDROcodone-homatropine (HYCODAN) 5-1.5 MG/5ML syrup; Take 5 mLs by mouth every 8 (eight) hours as needed for cough.  Dispense: 120 mL; Refill: 0  3. Essential (primary) hypertension - likely combination of otc decongestants and irregular schedule of antihypertensive medication compliance.  Patient Instructions  Take an over the counter antihistamine (generic is fine) for the next 7-10 days  Use Afrin type nasal spray twice a day for up to 4 days then stop  Rest and drink enough fluids to make your urine light yellow  Please take your blood pressure when you are feeling better, let me or Dr. Alphonsus Sias know if it is over 140 on top or 90 on the bottom.     Olean Ree, FNP-BC  [facility], [facility]  05/05/2016 8:51 PM

## 2016-05-08 ENCOUNTER — Encounter: Payer: Self-pay | Admitting: Obstetrics and Gynecology

## 2016-05-10 ENCOUNTER — Encounter: Payer: Self-pay | Admitting: Family Medicine

## 2016-05-12 ENCOUNTER — Other Ambulatory Visit: Payer: Self-pay | Admitting: Family Medicine

## 2016-05-12 DIAGNOSIS — J069 Acute upper respiratory infection, unspecified: Secondary | ICD-10-CM

## 2016-05-12 MED ORDER — HYDROCODONE-HOMATROPINE 5-1.5 MG/5ML PO SYRP: 5.0000 mL | ORAL_SOLUTION | Freq: Three times a day (TID) | ORAL | 0 refills | Status: DC | PRN

## 2016-05-12 NOTE — Progress Notes (Signed)
Received mychart message from patient requesting additional cough syrup. I told her that I would provided one additional refill. Printed and left at front desk for patient.

## 2016-05-15 ENCOUNTER — Encounter: Payer: Self-pay | Admitting: Obstetrics and Gynecology

## 2016-05-15 ENCOUNTER — Other Ambulatory Visit: Payer: Self-pay | Admitting: Obstetrics and Gynecology

## 2016-05-16 ENCOUNTER — Other Ambulatory Visit: Payer: Self-pay | Admitting: Obstetrics and Gynecology

## 2016-05-16 MED ORDER — TRAMADOL HCL 50 MG PO TABS: 50.0000 mg | ORAL_TABLET | Freq: Four times a day (QID) | ORAL | 3 refills | Status: DC | PRN

## 2016-05-24 ENCOUNTER — Encounter: Payer: Self-pay | Admitting: Family Medicine

## 2016-05-25 ENCOUNTER — Telehealth: Payer: Self-pay | Admitting: Obstetrics and Gynecology

## 2016-05-25 NOTE — Telephone Encounter (Signed)
Called pt she states that she is having severe pain in the lower right quadrat. Pt states that it feels similar to the pain she used to experience when she ovulated. Pt states that she is using a heating pad, percocet and motrin with no relief. Pt wants to know if there is anything in addition she can do. Pt states she is unable to even get up and do daily activities. Please advise.

## 2016-05-25 NOTE — Telephone Encounter (Signed)
PT LEFT A VM AND SAID SHE NEEDED A NURSE TO CALL HER.

## 2016-05-26 ENCOUNTER — Ambulatory Visit (INDEPENDENT_AMBULATORY_CARE_PROVIDER_SITE_OTHER): Payer: BLUE CROSS/BLUE SHIELD | Admitting: Obstetrics and Gynecology

## 2016-05-26 ENCOUNTER — Ambulatory Visit
Admission: EM | Admit: 2016-05-26 | Discharge: 2016-05-26 | Disposition: A | Discharge status: Home / Self Care | Payer: BLUE CROSS/BLUE SHIELD | Source: Ambulatory Visit | Attending: Obstetrics and Gynecology | Admitting: Obstetrics and Gynecology

## 2016-05-26 ENCOUNTER — Ambulatory Visit (INDEPENDENT_AMBULATORY_CARE_PROVIDER_SITE_OTHER): Payer: BLUE CROSS/BLUE SHIELD

## 2016-05-26 ENCOUNTER — Other Ambulatory Visit: Payer: Self-pay | Admitting: Obstetrics and Gynecology

## 2016-05-26 ENCOUNTER — Ambulatory Visit: Admit: 2016-05-26 | Payer: BLUE CROSS/BLUE SHIELD | Admitting: Obstetrics and Gynecology

## 2016-05-26 ENCOUNTER — Encounter
Admission: EM | Disposition: A | Discharge status: Home / Self Care | Payer: Self-pay | Source: Ambulatory Visit | Attending: Obstetrics and Gynecology

## 2016-05-26 ENCOUNTER — Ambulatory Visit: Payer: BLUE CROSS/BLUE SHIELD | Admitting: Registered Nurse

## 2016-05-26 ENCOUNTER — Encounter: Payer: Self-pay | Admitting: *Deleted

## 2016-05-26 DIAGNOSIS — I10 Essential (primary) hypertension: Secondary | ICD-10-CM | POA: Insufficient documentation

## 2016-05-26 DIAGNOSIS — Z881 Allergy status to other antibiotic agents status: Secondary | ICD-10-CM | POA: Diagnosis not present

## 2016-05-26 DIAGNOSIS — N736 Female pelvic peritoneal adhesions (postinfective): Secondary | ICD-10-CM | POA: Insufficient documentation

## 2016-05-26 DIAGNOSIS — R102 Pelvic and perineal pain: Secondary | ICD-10-CM

## 2016-05-26 DIAGNOSIS — Z880 Allergy status to penicillin: Secondary | ICD-10-CM | POA: Diagnosis not present

## 2016-05-26 DIAGNOSIS — N83511 Torsion of right ovary and ovarian pedicle: Secondary | ICD-10-CM | POA: Diagnosis not present

## 2016-05-26 DIAGNOSIS — Z888 Allergy status to other drugs, medicaments and biological substances status: Secondary | ICD-10-CM | POA: Diagnosis not present

## 2016-05-26 DIAGNOSIS — Z8742 Personal history of other diseases of the female genital tract: Secondary | ICD-10-CM | POA: Diagnosis not present

## 2016-05-26 DIAGNOSIS — E785 Hyperlipidemia, unspecified: Secondary | ICD-10-CM | POA: Diagnosis not present

## 2016-05-26 DIAGNOSIS — F419 Anxiety disorder, unspecified: Secondary | ICD-10-CM | POA: Diagnosis not present

## 2016-05-26 HISTORY — PX: LAPAROSCOPY: SHX197

## 2016-05-26 LAB — CBC
HCT: 39 % (ref 35.0–47.0)
Hemoglobin: 13.7 g/dL (ref 12.0–16.0)
MCH: 30.7 pg (ref 26.0–34.0)
MCHC: 35.1 g/dL (ref 32.0–36.0)
MCV: 87.2 fL (ref 80.0–100.0)
Platelets: 352 10*3/uL (ref 150–440)
RBC: 4.47 MIL/uL (ref 3.80–5.20)
RDW: 13.1 % (ref 11.5–14.5)
WBC: 9.6 10*3/uL (ref 3.6–11.0)

## 2016-05-26 LAB — TYPE AND SCREEN
ABO/RH(D): O POS
Antibody Screen: NEGATIVE

## 2016-05-26 SURGERY — LAPAROSCOPY, DIAGNOSTIC
Anesthesia: General | Laterality: Right

## 2016-05-26 MED ORDER — SUGAMMADEX SODIUM 200 MG/2ML IV SOLN
INTRAVENOUS | Status: AC
  Filled 2016-05-26: qty 2

## 2016-05-26 MED ORDER — SUCCINYLCHOLINE CHLORIDE 200 MG/10ML IV SOSY
PREFILLED_SYRINGE | INTRAVENOUS | Status: AC
  Filled 2016-05-26: qty 10

## 2016-05-26 MED ORDER — BUPIVACAINE HCL (PF) 0.5 % IJ SOLN
INTRAMUSCULAR | Status: AC
  Filled 2016-05-26: qty 30

## 2016-05-26 MED ORDER — KETOROLAC TROMETHAMINE 30 MG/ML IJ SOLN
30.0000 mg | Freq: Once | INTRAMUSCULAR | Status: AC
  Administered 2016-05-26: 30 mg via INTRAVENOUS

## 2016-05-26 MED ORDER — ONDANSETRON 4 MG PO TBDP: 4.0000 mg | ORAL_TABLET | Freq: Four times a day (QID) | ORAL | Status: DC | PRN

## 2016-05-26 MED ORDER — ONDANSETRON HCL 4 MG/2ML IJ SOLN
INTRAMUSCULAR | Status: AC
  Filled 2016-05-26: qty 2

## 2016-05-26 MED ORDER — OXYCODONE-ACETAMINOPHEN 5-325 MG PO TABS
ORAL_TABLET | ORAL | Status: AC
  Administered 2016-05-26: 2 via ORAL
  Filled 2016-05-26: qty 2

## 2016-05-26 MED ORDER — MIDAZOLAM HCL 2 MG/2ML IJ SOLN
INTRAMUSCULAR | Status: DC | PRN
  Administered 2016-05-26: 2 mg via INTRAVENOUS

## 2016-05-26 MED ORDER — PROPOFOL 10 MG/ML IV BOLUS
INTRAVENOUS | Status: DC | PRN
  Administered 2016-05-26: 50 mg via INTRAVENOUS
  Administered 2016-05-26: 150 mg via INTRAVENOUS

## 2016-05-26 MED ORDER — FAMOTIDINE 20 MG PO TABS
ORAL_TABLET | ORAL | Status: AC
  Administered 2016-05-26: 20 mg via ORAL
  Filled 2016-05-26: qty 1

## 2016-05-26 MED ORDER — PROPOFOL 10 MG/ML IV BOLUS
INTRAVENOUS | Status: AC
  Filled 2016-05-26: qty 20

## 2016-05-26 MED ORDER — BUPIVACAINE HCL 0.5 % IJ SOLN
INTRAMUSCULAR | Status: DC | PRN
  Administered 2016-05-26: 10 mL

## 2016-05-26 MED ORDER — LIDOCAINE HCL (PF) 2 % IJ SOLN
INTRAMUSCULAR | Status: AC
  Filled 2016-05-26: qty 2

## 2016-05-26 MED ORDER — ONDANSETRON HCL 4 MG/2ML IJ SOLN: 4.0000 mg | Freq: Four times a day (QID) | INTRAMUSCULAR | Status: DC | PRN

## 2016-05-26 MED ORDER — FENTANYL CITRATE (PF) 100 MCG/2ML IJ SOLN
INTRAMUSCULAR | Status: AC
  Administered 2016-05-26: 25 ug via INTRAVENOUS
  Filled 2016-05-26: qty 2

## 2016-05-26 MED ORDER — ONDANSETRON HCL 4 MG/2ML IJ SOLN
4.0000 mg | Freq: Once | INTRAMUSCULAR | Status: DC | PRN
  Administered 2016-05-26: 4 mg via INTRAVENOUS

## 2016-05-26 MED ORDER — FENTANYL CITRATE (PF) 100 MCG/2ML IJ SOLN
INTRAMUSCULAR | Status: DC | PRN
  Administered 2016-05-26: 50 ug via INTRAVENOUS
  Administered 2016-05-26: 100 ug via INTRAVENOUS
  Administered 2016-05-26: 50 ug via INTRAVENOUS

## 2016-05-26 MED ORDER — FAMOTIDINE 20 MG PO TABS
20.0000 mg | ORAL_TABLET | Freq: Once | ORAL | Status: AC
  Administered 2016-05-26: 20 mg via ORAL

## 2016-05-26 MED ORDER — LACTATED RINGERS IV SOLN
INTRAVENOUS | Status: DC
  Administered 2016-05-26: 18:00:00 via INTRAVENOUS

## 2016-05-26 MED ORDER — FENTANYL CITRATE (PF) 100 MCG/2ML IJ SOLN
INTRAMUSCULAR | Status: AC
  Filled 2016-05-26: qty 2

## 2016-05-26 MED ORDER — FENTANYL CITRATE (PF) 100 MCG/2ML IJ SOLN
25.0000 ug | INTRAMUSCULAR | Status: DC | PRN
  Administered 2016-05-26: 25 ug via INTRAVENOUS

## 2016-05-26 MED ORDER — OXYCODONE-ACETAMINOPHEN 5-325 MG PO TABS
1.0000 | ORAL_TABLET | Freq: Four times a day (QID) | ORAL | Status: DC | PRN
  Administered 2016-05-26: 2 via ORAL

## 2016-05-26 MED ORDER — FENTANYL CITRATE (PF) 100 MCG/2ML IJ SOLN
25.0000 ug | INTRAMUSCULAR | Status: DC | PRN
  Administered 2016-05-26 (×5): 25 ug via INTRAVENOUS

## 2016-05-26 MED ORDER — IBUPROFEN 200 MG PO TABS: 600.0000 mg | ORAL_TABLET | Freq: Four times a day (QID) | ORAL | 0 refills | Status: DC | PRN

## 2016-05-26 MED ORDER — SOD CITRATE-CITRIC ACID 500-334 MG/5ML PO SOLN
30.0000 mL | ORAL | Status: DC
  Filled 2016-05-26: qty 30

## 2016-05-26 MED ORDER — SCOPOLAMINE 1 MG/3DAYS TD PT72
MEDICATED_PATCH | TRANSDERMAL | Status: AC
  Administered 2016-05-26: 1.5 mg via TRANSDERMAL
  Filled 2016-05-26: qty 1

## 2016-05-26 MED ORDER — SUCCINYLCHOLINE CHLORIDE 20 MG/ML IJ SOLN
INTRAMUSCULAR | Status: DC | PRN
  Administered 2016-05-26: 100 mg via INTRAVENOUS

## 2016-05-26 MED ORDER — ONDANSETRON HCL 4 MG/2ML IJ SOLN
INTRAMUSCULAR | Status: DC | PRN
  Administered 2016-05-26: 4 mg via INTRAVENOUS

## 2016-05-26 MED ORDER — LIDOCAINE HCL (PF) 1 % IJ SOLN
INTRAMUSCULAR | Status: AC
  Filled 2016-05-26: qty 30

## 2016-05-26 MED ORDER — ONDANSETRON HCL 4 MG/2ML IJ SOLN
INTRAMUSCULAR | Status: AC
  Administered 2016-05-26: 4 mg via INTRAVENOUS
  Filled 2016-05-26: qty 2

## 2016-05-26 MED ORDER — SCOPOLAMINE 1 MG/3DAYS TD PT72
1.0000 | MEDICATED_PATCH | TRANSDERMAL | Status: DC
  Administered 2016-05-26: 1.5 mg via TRANSDERMAL

## 2016-05-26 MED ORDER — DEXTROSE IN LACTATED RINGERS 5 % IV SOLN: INTRAVENOUS | Status: DC

## 2016-05-26 MED ORDER — SUGAMMADEX SODIUM 200 MG/2ML IV SOLN
INTRAVENOUS | Status: DC | PRN
  Administered 2016-05-26: 200 mg via INTRAVENOUS

## 2016-05-26 MED ORDER — DEXAMETHASONE SODIUM PHOSPHATE 10 MG/ML IJ SOLN
INTRAMUSCULAR | Status: DC | PRN
  Administered 2016-05-26: 10 mg via INTRAVENOUS

## 2016-05-26 MED ORDER — SODIUM CHLORIDE 0.9 % IJ SOLN
INTRAMUSCULAR | Status: DC | PRN
  Administered 2016-05-26: 400 mL

## 2016-05-26 MED ORDER — ROCURONIUM BROMIDE 100 MG/10ML IV SOLN
INTRAVENOUS | Status: DC | PRN
  Administered 2016-05-26: 50 mg via INTRAVENOUS

## 2016-05-26 MED ORDER — LIDOCAINE HCL (CARDIAC) 20 MG/ML IV SOLN
INTRAVENOUS | Status: DC | PRN
  Administered 2016-05-26: 100 mg via INTRAVENOUS

## 2016-05-26 MED ORDER — DEXAMETHASONE SODIUM PHOSPHATE 10 MG/ML IJ SOLN
INTRAMUSCULAR | Status: AC
  Filled 2016-05-26: qty 1

## 2016-05-26 MED ORDER — OXYCODONE-ACETAMINOPHEN 5-325 MG PO TABS: 1.0000 | ORAL_TABLET | Freq: Four times a day (QID) | ORAL | 0 refills | Status: DC | PRN

## 2016-05-26 MED ORDER — MIDAZOLAM HCL 2 MG/2ML IJ SOLN
INTRAMUSCULAR | Status: AC
  Filled 2016-05-26: qty 2

## 2016-05-26 MED ORDER — ROCURONIUM BROMIDE 50 MG/5ML IV SOLN
INTRAVENOUS | Status: AC
  Filled 2016-05-26: qty 1

## 2016-05-26 MED ORDER — KETOROLAC TROMETHAMINE 30 MG/ML IJ SOLN
INTRAMUSCULAR | Status: AC
Start: 2016-05-26 — End: 2016-05-26
  Administered 2016-05-26: 30 mg via INTRAVENOUS
  Filled 2016-05-26: qty 1

## 2016-05-26 SURGICAL SUPPLY — 55 items
ADHESIVE MASTISOL STRL (MISCELLANEOUS) IMPLANT
BAG DECANTER FOR FLEXI CONT (MISCELLANEOUS) IMPLANT
BLADE SURG 15 STRL SS SAFETY (BLADE) ×3 IMPLANT
BLADE SURG SZ11 CARB STEEL (BLADE) IMPLANT
CANISTER SUCT 1200ML W/VALVE (MISCELLANEOUS) ×3 IMPLANT
CATH ROBINSON RED A/P 16FR (CATHETERS) ×3 IMPLANT
CATH TRAY 16F METER LATEX (MISCELLANEOUS) IMPLANT
CHLORAPREP W/TINT 26ML (MISCELLANEOUS) ×3 IMPLANT
CLEANER CAUTERY TIP 5X5 PAD (MISCELLANEOUS) ×2 IMPLANT
CORD MONOPOLAR M/FML 12FT (MISCELLANEOUS) IMPLANT
COVER MAYO STAND STRL (DRAPES) IMPLANT
DRAPE LEGGINS SURG 28X43 STRL (DRAPES) ×3 IMPLANT
DRAPE XRAY CASSETTE 23X24 (DRAPES) ×3 IMPLANT
ELECT REM PT RETURN 9FT ADLT (ELECTROSURGICAL) ×3
ELECTRODE REM PT RTRN 9FT ADLT (ELECTROSURGICAL) ×2 IMPLANT
GOWN STRL REUS W/ TWL LRG LVL3 (GOWN DISPOSABLE) ×4 IMPLANT
GOWN STRL REUS W/TWL LRG LVL3 (GOWN DISPOSABLE) ×2
GOWN STRL REUS W/TWL XL LVL3 (GOWN DISPOSABLE) ×3 IMPLANT
HANDLE YANKAUER SUCT BULB TIP (MISCELLANEOUS) IMPLANT
IRRIGATION STRYKERFLOW (MISCELLANEOUS) ×2 IMPLANT
IRRIGATOR STRYKERFLOW (MISCELLANEOUS) ×3
IV LACTATED RINGERS 1000ML (IV SOLUTION) IMPLANT
IV SOD CHL 0.9% 1000ML (IV SOLUTION) ×3 IMPLANT
KIT PINK PAD W/HEAD ARE REST (MISCELLANEOUS) ×3
KIT PINK PAD W/HEAD ARM REST (MISCELLANEOUS) ×2 IMPLANT
KIT RM TURNOVER CYSTO AR (KITS) ×3 IMPLANT
LIGASURE MARYLAND LAP STAND (ELECTROSURGICAL) ×3 IMPLANT
NEEDLE HYPO 22GX1.5 SAFETY (NEEDLE) ×3 IMPLANT
NS IRRIG 500ML POUR BTL (IV SOLUTION) ×3 IMPLANT
PACK GYN LAPAROSCOPIC (MISCELLANEOUS) ×3 IMPLANT
PAD CLEANER CAUTERY TIP 5X5 (MISCELLANEOUS) ×1
PAD OB MATERNITY 4.3X12.25 (PERSONAL CARE ITEMS) ×3 IMPLANT
PAD PREP 24X41 OB/GYN DISP (PERSONAL CARE ITEMS) ×3 IMPLANT
POUCH ENDO CATCH 10MM SPEC (MISCELLANEOUS) IMPLANT
SCISSORS METZENBAUM CVD 33 (INSTRUMENTS) ×3 IMPLANT
SLEEVE ENDOPATH XCEL 5M (ENDOMECHANICALS) ×6 IMPLANT
SPONGE LAP 18X18 5 PK (GAUZE/BANDAGES/DRESSINGS) ×3 IMPLANT
SPONGE XRAY 4X4 16PLY STRL (MISCELLANEOUS) ×3 IMPLANT
STRIP CLOSURE SKIN 1/2X4 (GAUZE/BANDAGES/DRESSINGS) ×3 IMPLANT
SUT VIC AB 0 CT1 18XCR BRD 8 (SUTURE) ×2 IMPLANT
SUT VIC AB 0 CT1 36 (SUTURE) IMPLANT
SUT VIC AB 0 CT1 8-18 (SUTURE) ×1
SUT VIC AB 3-0 SH 27 (SUTURE)
SUT VIC AB 3-0 SH 27X BRD (SUTURE) IMPLANT
SUT VICRYL 0 AB UR-6 (SUTURE) ×3 IMPLANT
SUT VICRYL 0 UR6 27IN ABS (SUTURE) IMPLANT
SYR 3ML LL SCALE MARK (SYRINGE) ×3 IMPLANT
SYRINGE 10CC LL (SYRINGE) ×3 IMPLANT
TOWEL OR 17X26 4PK STRL BLUE (TOWEL DISPOSABLE) ×3 IMPLANT
TROCAR ENDO BLADELESS 11MM (ENDOMECHANICALS) IMPLANT
TROCAR XCEL NON-BLD 5MMX100MML (ENDOMECHANICALS) ×3 IMPLANT
TROCAR XCEL UNIV SLVE 11M 100M (ENDOMECHANICALS) IMPLANT
TUBING CONNECTING 10 (TUBING) ×3 IMPLANT
TUBING INSUFFLATION (TUBING) ×3 IMPLANT
TUBING INSUFFLATOR HI FLOW (MISCELLANEOUS) ×3 IMPLANT

## 2016-05-26 NOTE — Discharge Instructions (Signed)

## 2016-05-26 NOTE — Progress Notes (Signed)
HPI:      Ms. Roena MaladyJennifer L Mashaw is a 36 y.o. [id number] who LMP was Patient's last menstrual period was 10/15/2013.  Subjective: She presents today stating that she has had 5 days of right-sided pelvic pain that has been getting worse daily. She has been taking Percocet and Motrin to control her pain. She states that she can barely function and it has doubled her over several times a day while trying to work. Significant history includes a history of previous hysterectomy and left oophorectomy as well as a laparoscopy for endometriosis. The patient says this pain is not like her pain from endometriosis. She has also had a previous ovarian cyst rupture and she says does not like that pain either. Ultrasound today reveals a small ovary without cyst however no blood flow whatsoever could be documented to this ovary.    Hx: The following portions of the patient's history were reviewed and updated as appropriate:           She  has a past medical history of Abnormal Pap smear; Anxiety; Anxiety; Cough; Endometriosis; HA (headache); Hyperlipidemia; Hypertension; PONV (postoperative nausea and vomiting); and Pregnancy induced hypertension (2008). She  does not have any pertinent problems on file. She  has a past surgical history that includes Tonsillectomy; Tympanostomy tube placement; Cesarean section (2008); laparoscopy (12/01/2011); biopsy (12/01/2011); Endometrial biopsy; Colposcopy; Cesarean section with bilateral tubal ligation (N/A, 12/02/2012); Abdominal hysterectomy (7/15); Tubal ligation; Tonsillectomy; laparoscopy (N/A, 06/04/2014); Laparoscopic lysis of adhesions (N/A, 06/04/2014); Ablation on endometriosis (N/A, 06/04/2014); Laparoscopic bilateral salpingectomy (Bilateral, 02/15/2015); Oophorectomy (Left); Salpingectomy (Bilateral); and laparoscopy (N/A, 02/14/2016). Her family history includes Cancer in her paternal grandfather; Coronary artery disease in her paternal uncle; Hypertension in her  father. She  reports that she has never smoked. She has never used smokeless tobacco. She reports that she drinks alcohol. She reports that she does not use drugs. She has a current medication list which includes the following prescription(s): bisoprolol-hydrochlorothiazide, hydrocodone-homatropine, losartan-hydrochlorothiazide, norethindrone, oxycodone-acetaminophen, tramadol, and zolpidem.        ROS: Constitutional: Denied constitutional symptoms, night sweats, recent illness, fatigue, fever, insomnia and weight loss.  Eyes: Denied eye symptoms, eye pain, photophobia, vision change and visual disturbance.  Ears/Nose/Throat/Neck: Denied ear, nose, throat or neck symptoms, hearing loss, nasal discharge, sinus congestion and sore throat.  Cardiovascular: Denied cardiovascular symptoms, arrhythmia, chest pain/pressure, edema, exercise intolerance, orthopnea and palpitations.  Respiratory: Denied pulmonary symptoms, asthma, pleuritic pain, productive sputum, cough, dyspnea and wheezing.  Gastrointestinal: Denied, gastro-esophageal reflux, melena, nausea and vomiting.  Genitourinary: See HPI for additional information.  Musculoskeletal: Denied musculoskeletal symptoms, stiffness, swelling, muscle weakness and myalgia.  Dermatologic: Denied dermatology symptoms, rash and scar.  Neurologic: Denied neurology symptoms, dizziness, headache, neck pain and syncope.  Psychiatric: Denied psychiatric symptoms, anxiety and depression.  Endocrine: Denied endocrine symptoms including hot flashes and night sweats.   Meds: She has a current medication list which includes the following prescription(s): bisoprolol-hydrochlorothiazide, hydrocodone-homatropine, losartan-hydrochlorothiazide, norethindrone, oxycodone-acetaminophen, tramadol, and zolpidem.  Objective: There were no vitals filed for this visit.          Physical examination General NAD, Conversant  HEENT Atraumatic; Op clear with mmm.   Normo-cephalic. Pupils reactive. Anicteric sclerae  Thyroid/Neck Smooth without nodularity or enlargement. Normal ROM.  Neck Supple.  Skin No rashes, lesions or ulceration. Normal palpated skin turgor. No nodularity.  Breasts: No masses or discharge.  Symmetric.  No axillary adenopathy.  Lungs: Clear to auscultation.No rales or wheezes. Normal Respiratory effort, no retractions.  Heart: NSR.  No murmurs or rubs appreciated. No periferal edema  Abdomen: Soft.  Tender in RLQ.  Guarding present.   No masses.  No HSM. No hernia  Extremities: Moves all appropriately.  Normal ROM for age. No lymphadenopathy.  Neuro: Oriented to PPT.  Normal mood. Normal affect.     Assessment: 1. Pelvic pain in female   2. Torsion of right ovary and ovarian pedicle   3. Personal history of endometriosis     Acute onset of right-sided pelvic pain with absent flow to the right ovary. Possible ovarian torsion or intermittent torsion, possible endometriosis with adhesions contributing to sudden onset pelvic pain. No evidence of ovarian cyst, no evidence of free fluid.  Plan:             1.  LPY for Pain We have discussed the procedure of Exploratory Laparoscopy in detail.  Medical management of pelvic pain has also been discussed and the patient has decided that this option is not satisfactory for her.  She has elected to have a Laparoscopy performed to attempt to diagnose and manage her pain.  I have informed her that Laparoscopy, like other surgical procedures, entails the following risks:  bleeding, infection, damage to bowel, bladder or other internal organ, and the risk or anesthesia.  She is aware that her risks are not limited to these.  We have discussed the possibility of Endometriosis and Pelvic Adhesive Disease.  We have discussed surgical as well as subsequent medical management of these conditions.  The patient is aware that both of these conditions can often be diagnosed and treated via Laparoscopy.  We  have discussed the possibility  of recurrence and that despite adequate treatment at this time, pelvic pain may reoccur in the future.  We have discussed the possibility that Laparoscopy may not diagnose or cure her pain.  It is possible that no diagnosis will be made in up to 50% of cases. I have explained to her that the ovary may or may not be salvageable if it has been without blood flow. She understands that removal of this ovary is more than 50% likely. She is aware that intraoperative blood flow to the ovary is a judgment and that I will use my best judgment to save or remove her ovary. I have answered all of her questions and I believe she has been well informed regarding the risks/benefits of Exploratory Laparoscopy for pelvic pain.   Orders No orders of the defined types were placed in this encounter.   No orders of the defined types were placed in this encounter.       F/U  Return for As Scheduled Post-op.  Elonda Husky, M.D. 05/26/2016 5:19 PM

## 2016-05-26 NOTE — Anesthesia Procedure Notes (Addendum)
Procedure Name: Intubation Date/Time: 05/26/2016 6:38 PM Performed by: Doreen Salvage Pre-anesthesia Checklist: Patient identified, Patient being monitored, Timeout performed, Emergency Drugs available and Suction available Patient Re-evaluated:Patient Re-evaluated prior to inductionOxygen Delivery Method: Circle system utilized Preoxygenation: Pre-oxygenation with 100% oxygen Intubation Type: IV induction, Rapid sequence and Cricoid Pressure applied Ventilation: Mask ventilation without difficulty Laryngoscope Size: Mac and 3 Grade View: Grade I Tube type: Oral Tube size: 7.0 mm Number of attempts: 1 Airway Equipment and Method: Stylet Placement Confirmation: ETT inserted through vocal cords under direct vision,  positive ETCO2 and breath sounds checked- equal and bilateral Secured at: 21 cm Tube secured with: Tape Dental Injury: Teeth and Oropharynx as per pre-operative assessment

## 2016-05-26 NOTE — Op Note (Signed)
OPERATIVE NOTE 05/26/2016 8:19 PM  PRE-OPERATIVE DIAGNOSIS:  1) Right ovarian torsion  POST-OPERATIVE DIAGNOSIS:  Extensive pelvic adhesions  OPERATION:  LAPAROSCOPY operative with lysis of adhesions:   SURGEON(S): Surgeon(s) and Role:    * Linzie Collinavid James Aby Gessel, MD - Primary   ANESTHESIA: General  ESTIMATED BLOOD LOSS: 10 mL's  OPERATIVE FINDINGS: Extensive pelvic adhesions  SPECIMEN: * No specimens in log *  COMPLICATIONS: None  DISPOSITION: Stable to recovery room  DESCRIPTION OF PROCEDURE:      The patient was prepped and draped in the dorsolithotomy position and placed under general anesthesia. The bladder was emptied. Rolled and moistened 4 x 4's were placed on a sponge stick and placed at the vaginal cuff.  Her bladder was emptied proximally 75 cc of clear urine.  After changing gloves we proceeded abdominally. A small infraumbilical incision was made and a 5 mm trocar port was placed within the abdominopelvic cavity. The opening pressure was less than 7 mmHg.  Approximately 3 and 1/2 L of carbon dioxide gas was instilled within the abdominal pelvic cavity. The laparoscope was placed and the pelvis and abdomen were carefully inspected. Extensive adhesions were noted including to the right ovary attached to small bowel. There were multiple other findings on the bowel adhesions throughout the pelvis. The ovary appeared pink and no evidence of torsion was encountered. A ruptured corpus luteum cyst was easily visualized. Although it would have been difficult no endometriosis was seen. Systematic lysis of pelvic adhesions was performed with blunt dissection as well as with the bipolar cautery. After lysing all adhesions and carefully inspecting the pelvis and ovary no abnormalities were encountered. The pelvis was irrigated using saline and the irrigant was then removed.  Hemostasis of all areas of the pelvis was noted. The ports were removed under direct visualization.  Hemostasis was  noted.  The laparoscope was removed the trocar sleeve was removed and the incision was closed with a deep suture through the fascia of 0 Vicryl followed by subcuticular closure of the skin for all port sites. A long-acting anesthetic was injected.  Steri-Strips were applied. The uterine manipulator was removed. Hemostasis of the cervix was noted. The patient went to the recovery room in stable condition.  Elonda Huskyavid J. Zannah Melucci, M.D. 05/26/2016 8:19 PM

## 2016-05-26 NOTE — Transfer of Care (Signed)
Immediate Anesthesia Transfer of Care Note  Patient: Terri MaladyJennifer L Silberstein  Procedure(s) Performed: Procedure(s): LAPAROSCOPY operative with lysis of adhesions (N/A)  Patient Location: PACU  Anesthesia Type:General  Level of Consciousness: sedated  Airway & Oxygen Therapy: Patient Spontanous Breathing and Patient connected to face mask oxygen  Post-op Assessment: Report given to RN and Post -op Vital signs reviewed and stable  Post vital signs: Reviewed and stable  Last Vitals:  Vitals:   05/26/16 1806 05/26/16 1955  BP: (!) 165/107 134/74  Pulse: 91 89  Resp: 18 14  Temp: 36.7 C (!) 35.8 C    Complications: No apparent anesthesia complications

## 2016-05-26 NOTE — Anesthesia Preprocedure Evaluation (Signed)
Anesthesia Evaluation  Patient identified by MRN, date of birth, ID band Patient awake    Reviewed: Allergy & Precautions, H&P , NPO status , Patient's Chart, lab work & pertinent test results, reviewed documented beta blocker date and time   History of Anesthesia Complications (+) PONV and history of anesthetic complications  Airway Mallampati: II  TM Distance: >3 FB Neck ROM: full    Dental  (+) Teeth Intact   Pulmonary neg pulmonary ROS,    Pulmonary exam normal        Cardiovascular Exercise Tolerance: Good hypertension, On Medications negative cardio ROS Normal cardiovascular exam Rhythm:regular Rate:Normal     Neuro/Psych  Headaches, negative neurological ROS  negative psych ROS   GI/Hepatic negative GI ROS, Neg liver ROS,   Endo/Other  negative endocrine ROS  Renal/GU negative Renal ROS  negative genitourinary   Musculoskeletal   Abdominal   Peds  Hematology negative hematology ROS (+)   Anesthesia Other Findings Past Medical History: No date: Abnormal Pap smear No date: Anxiety No date: Anxiety No date: Cough     Comment: HAS COLD. PRODUCTIVE CLEAR. NO FEVER. NO               WHEEZING. TO SEE PCP IF WORSENS No date: Endometriosis No date: HA (headache)     Comment: otc med prn No date: Hyperlipidemia     Comment: diet controlled, no meds No date: Hypertension No date: PONV (postoperative nausea and vomiting) 2008: Pregnancy induced hypertension Past Surgical History: 7/15: ABDOMINAL HYSTERECTOMY     Comment: for endometriosis 06/04/2014: ABLATION ON ENDOMETRIOSIS N/A     Comment: Procedure: ABLATION ON ENDOMETRIOSIS;                Surgeon: Leslie Andrea, MD;  Location: [facility];  Service: Gynecology;  Laterality: N/A; 12/01/2011: BIOPSY     Comment: Procedure: BIOPSY;  Surgeon: Leslie Andrea, MD;  Location: [facility];  Service:               Gynecology;   Laterality: N/A; 2008: CESAREAN SECTION 12/02/2012: CESAREAN SECTION WITH BILATERAL TUBAL LIGATION N/A     Comment: Procedure: Repeat Cesarean Section Delivery               Baby Girl @ 1826, Apgars 8/9, Bilateral Tubal               Ligation;  Surgeon: Leslie Andrea, MD;                Location: [facility];  Service: Obstetrics;                Laterality: N/A; No date: COLPOSCOPY     Comment: as teenager, results WNL No date: ENDOMETRIAL BIOPSY 02/15/2015: LAPAROSCOPIC BILATERAL SALPINGECTOMY Bilateral     Comment: Procedure: LAPAROSCOPIC BILATERAL               SALPINGECTOMY;  Surgeon: Hildred Laser, MD;                Location: [facility];  Service: Gynecology;                Laterality: Bilateral; 06/04/2014: LAPAROSCOPIC LYSIS OF ADHESIONS N/A     Comment: Procedure: LAPAROSCOPIC LYSIS OF ADHESIONS;  Surgeon: Leslie AndreaJames E Tomblin II, MD;  Location: [facility];  Service: Gynecology;  Laterality: N/A; 12/01/2011: LAPAROSCOPY     Comment: Procedure: LAPAROSCOPY OPERATIVE;  Surgeon:               Leslie AndreaJames E Tomblin II, MD;  Location: [facility];                Service: Gynecology;  Laterality: N/A; 06/04/2014: LAPAROSCOPY N/A     Comment: Procedure: LAPAROSCOPY OPERATIVE;  Surgeon:               Leslie AndreaJames E Tomblin II, MD;  Location: [facility];                Service: Gynecology;  Laterality: N/A; 02/14/2016: LAPAROSCOPY N/A     Comment: Procedure: LAPAROSCOPY DIAGNOSTIC WITH               BIOPSIES POSSIBLE ADHESIOLYSIS;  Surgeon: Hildred LaserAnika              Cherry, MD;  Location: [facility];  Service:               Gynecology;  Laterality: N/A; No date: OOPHORECTOMY Left No date: SALPINGECTOMY Bilateral No date: TONSILLECTOMY No date: TONSILLECTOMY No date: TUBAL LIGATION No date: TYMPANOSTOMY TUBE PLACEMENT BMI    Body Mass Index:  36.28 kg/m     Reproductive/Obstetrics negative OB ROS                             Anesthesia Physical Anesthesia  Plan  ASA: II and emergent  Anesthesia Plan: General ETT   Post-op Pain Management:    Induction:   Airway Management Planned:   Additional Equipment:   Intra-op Plan:   Post-operative Plan:   Informed Consent: I have reviewed the patients History and Physical, chart, labs and discussed the procedure including the risks, benefits and alternatives for the proposed anesthesia with the patient or authorized representative who has indicated his/her understanding and acceptance.   Dental Advisory Given  Plan Discussed with: CRNA  Anesthesia Plan Comments:         Anesthesia Quick Evaluation

## 2016-05-26 NOTE — Telephone Encounter (Signed)
Late Entry (original telephone encounter 05/26/2015 at 4:48 pm):   Contacted patient regarding her pain.  Patient notes that she has been having pain off and on for the past 4 days, gradually worsening.  Notes that heating pad, Percocet, and Motrin have not helped to ease the pain.  Pain is mostly in right lower quadrant.  Discussed with patient that if pain is that severe, it is recommended that she go to the Emergency room for further evaluation and pain management. Patient stated that she would prefer not to have to go to the ER if she can help it.  Also offered for patient to go to an urgent care if necessary.  Patient notes that she will consider if pain continues to worsen.  If patient's pain is manageable overnight, offered for her to f/u in the office for an ultrasound and to assess pelvic pain.  Patient to call in the morning if she does not go to the ER.    Hildred LaserAnika Treydon Henricks, MD [facility]

## 2016-05-26 NOTE — Anesthesia Post-op Follow-up Note (Cosign Needed)
Anesthesia QCDR form completed.        

## 2016-05-26 NOTE — Progress Notes (Signed)
PRE-OPERATIVE HISTORY AND PHYSICAL EXAM  PCP:  Emi Belfast, FNP  HPI:  [name] is a 36 y.o. [id number].  Patient's last menstrual period was 10/15/2013.  She presents today for a pre-op discussion and PE.  She has the following symptoms:  She presents today stating that she has had 5 days of right-sided pelvic pain that has been getting worse daily. She has been taking Percocet and Motrin to control her pain. She states that she can barely function and it has doubled her over several times a day while trying to work. Significant history includes a history of previous hysterectomy and left oophorectomy as well as a laparoscopy for endometriosis. The patient says this pain is not like her pain from endometriosis. She has also had a previous ovarian cyst rupture and she says does not like that pain either. Ultrasound today reveals a small ovary without cyst however no blood flow whatsoever could be documented to this ovary.  ROS: Constitutional: Denied constitutional symptoms, night sweats, recent illness, fatigue, fever, insomnia and weight loss.  Eyes: Denied eye symptoms, eye pain, photophobia, vision change and visual disturbance.  Ears/Nose/Throat/Neck: Denied ear, nose, throat or neck symptoms, hearing loss, nasal discharge, sinus congestion and sore throat.  Cardiovascular: Denied cardiovascular symptoms, arrhythmia, chest pain/pressure, edema, exercise intolerance, orthopnea and palpitations.  Respiratory: Denied pulmonary symptoms, asthma, pleuritic pain, productive sputum, cough, dyspnea and wheezing.  Gastrointestinal: Denied, gastro-esophageal reflux, melena, nausea and vomiting.  Genitourinary: See HPI for additional information.  Musculoskeletal: Denied musculoskeletal symptoms, stiffness, swelling, muscle weakness and myalgia.  Dermatologic: Denied dermatology symptoms, rash and scar.  Neurologic: Denied neurology symptoms, dizziness, headache, neck pain and syncope.   Psychiatric: Denied psychiatric symptoms, anxiety and depression.  Endocrine: Denied endocrine symptoms including hot flashes and night sweats.    OB History  Gravida Para Term Preterm AB Living  2 2 1 1   2   SAB TAB Ectopic Multiple Live Births          2    # Outcome Date GA Lbr Len/2nd Weight Sex Delivery Anes PTL Lv  2 Preterm 12/02/12 [redacted]w[redacted]d  6 lb 6.8 oz (2.915 kg) F CS-LTranv Spinal  LIV  1 Term 2008 [redacted]w[redacted]d  6 lb 14 oz (3.118 kg) F CS-LTranv Spinal N LIV     Birth Comments:  pre-e on MAG, IOL. failure to progress      Past Medical History:  Diagnosis Date  . Abnormal Pap smear   . Anxiety   . Anxiety   . Cough    HAS COLD. PRODUCTIVE CLEAR. NO FEVER. NO WHEEZING. TO SEE PCP IF WORSENS  . Endometriosis   . HA (headache)    otc med prn  . Hyperlipidemia    diet controlled, no meds  . Hypertension   . PONV (postoperative nausea and vomiting)   . Pregnancy induced hypertension 2008    Past Surgical History:  Procedure Laterality Date  . ABDOMINAL HYSTERECTOMY  7/15   for endometriosis  . ABLATION ON ENDOMETRIOSIS N/A 06/04/2014   Procedure: ABLATION ON ENDOMETRIOSIS;  Surgeon: Leslie Andrea, MD;  Location: [facility];  Service: Gynecology;  Laterality: N/A;  . BIOPSY  12/01/2011   Procedure: BIOPSY;  Surgeon: Leslie Andrea, MD;  Location: [facility];  Service: Gynecology;  Laterality: N/A;  . CESAREAN SECTION  2008  . CESAREAN SECTION WITH BILATERAL TUBAL LIGATION N/A 12/02/2012   Procedure: Repeat Cesarean Section Delivery Baby Girl @ 1826, Apgars 8/9, Bilateral Tubal  Ligation;  Surgeon: Leslie AndreaJames E Tomblin II, MD;  Location: [facility];  Service: Obstetrics;  Laterality: N/A;  . COLPOSCOPY     as teenager, results WNL  . ENDOMETRIAL BIOPSY    . LAPAROSCOPIC BILATERAL SALPINGECTOMY Bilateral 02/15/2015   Procedure: LAPAROSCOPIC BILATERAL SALPINGECTOMY;  Surgeon: Hildred LaserAnika Cherry, MD;  Location: [facility];  Service: Gynecology;  Laterality: Bilateral;  . LAPAROSCOPIC LYSIS OF  ADHESIONS N/A 06/04/2014   Procedure: LAPAROSCOPIC LYSIS OF ADHESIONS;  Surgeon: Leslie AndreaJames E Tomblin II, MD;  Location: [facility];  Service: Gynecology;  Laterality: N/A;  . LAPAROSCOPY  12/01/2011   Procedure: LAPAROSCOPY OPERATIVE;  Surgeon: Leslie AndreaJames E Tomblin II, MD;  Location: [facility];  Service: Gynecology;  Laterality: N/A;  . LAPAROSCOPY N/A 06/04/2014   Procedure: LAPAROSCOPY OPERATIVE;  Surgeon: Leslie AndreaJames E Tomblin II, MD;  Location: [facility];  Service: Gynecology;  Laterality: N/A;  . LAPAROSCOPY N/A 02/14/2016   Procedure: LAPAROSCOPY DIAGNOSTIC WITH BIOPSIES POSSIBLE ADHESIOLYSIS;  Surgeon: Hildred LaserAnika Cherry, MD;  Location: [facility];  Service: Gynecology;  Laterality: N/A;  . OOPHORECTOMY Left   . SALPINGECTOMY Bilateral   . TONSILLECTOMY    . TONSILLECTOMY    . TUBAL LIGATION    . TYMPANOSTOMY TUBE PLACEMENT        SOCIAL HISTORY: History  Smoking Status  . Never Smoker  Smokeless Tobacco  . Never Used   History  Alcohol Use  . Yes    Comment: occasional   History  Drug Use No    Family History  Problem Relation Age of Onset  . Hypertension Father   . Coronary artery disease Paternal Uncle   . Cancer Paternal Grandfather   . Diabetes Neg Hx     ALLERGIES:  Sulfamethoxazole-trimethoprim; Amoxil [amoxicillin]; Azithromycin; Cefprozil; Ceftin [cefuroxime axetil]; Other; Penicillins; and Sulfa antibiotics  Current Outpatient Prescriptions on File Prior to Visit  Medication Sig Dispense Refill  . bisoprolol-hydrochlorothiazide (ZIAC) 10-6.25 MG tablet Take 1 tablet by mouth daily. (Patient taking differently: Take 1 tablet by mouth at bedtime. ) 90 tablet 3  . HYDROcodone-homatropine (HYCODAN) 5-1.5 MG/5ML syrup Take 5 mLs by mouth every 8 (eight) hours as needed for cough. 120 mL 0  . losartan-hydrochlorothiazide (HYZAAR) 100-25 MG tablet Take 1 tablet by mouth daily. 90 tablet 3  . norethindrone (AYGESTIN) 5 MG tablet Take 5 mg by mouth daily.     Marland Kitchen. oxyCODONE-acetaminophen  (PERCOCET/ROXICET) 5-325 MG tablet Take 1-2 tablets by mouth every 6 (six) hours as needed. 90 tablet 0  . traMADol (ULTRAM) 50 MG tablet Take 1 tablet (50 mg total) by mouth every 6 (six) hours as needed for moderate pain. 60 tablet 3  . zolpidem (AMBIEN) 5 MG tablet take 5mg s daily at night 30 tablet 2   No current facility-administered medications on file prior to visit.    No orders of the defined types were placed in this encounter.    Physical examination LMP 10/15/2013   Physical examination General NAD, Conversant  HEENT Atraumatic; Op clear with mmm.  Normo-cephalic. Pupils reactive. Anicteric sclerae  Thyroid/Neck Smooth without nodularity or enlargement. Normal ROM.  Neck Supple.  Skin No rashes, lesions or ulceration. Normal palpated skin turgor. No nodularity.  Breasts: No masses or discharge.  Symmetric.  No axillary adenopathy.  Lungs: Clear to auscultation.No rales or wheezes. Normal Respiratory effort, no retractions.  Heart: NSR.  No murmurs or rubs appreciated. No periferal edema  Abdomen: Soft.  Tender in RLQ.  Guarding present.   No masses.  No HSM.  No hernia  Extremities: Moves all appropriately.  Normal ROM for age. No lymphadenopathy.  Neuro: Oriented to PPT.  Normal mood. Normal affect.       Assessment: 1. Pelvic pain in female   2. Torsion of right ovary and ovarian pedicle   3. Personal history of endometriosis   Acute onset of right-sided pelvic pain with absent flow to the right ovary. Possible ovarian torsion or intermittent torsion, possible endometriosis with adhesions contributing to sudden onset pelvic pain. No evidence of ovarian cyst, no evidence of free fluid.   PLAN: 1.  Exploratory Laparoscopy with possible Right oophorectomy, possible LOA.  Pre-op discussions regarding Risks and Benefits of her scheduled surgery.     1.  LPY for Pain We have discussed the procedure of Exploratory Laparoscopy in detail.  Medical management of pelvic  pain has also been discussed and the patient has decided that this option is not satisfactory for her.  She has elected to have a Laparoscopy performed to attempt to diagnose and manage her pain.  I have informed her that Laparoscopy, like other surgical procedures, entails the following risks:  bleeding, infection, damage to bowel, bladder or other internal organ, and the risk or anesthesia.  She is aware that her risks are not limited to these.  We have discussed the possibility of Endometriosis and Pelvic Adhesive Disease.  We have discussed surgical as well as subsequent medical management of these conditions.  The patient is aware that both of these conditions can often be diagnosed and treated via Laparoscopy.  We have discussed the possibility  of recurrence and that despite adequate treatment at this time, pelvic pain may reoccur in the future.  We have discussed the possibility that Laparoscopy may not diagnose or cure her pain.  It is possible that no diagnosis will be made in up to 50% of cases. I have explained to her that the ovary may or may not be salvageable if it has been without blood flow. She understands that removal of this ovary is more than 50% likely. She is aware that intraoperative blood flow to the ovary is a judgment and that I will use my best judgment to save or remove her ovary. I have answered all of her questions and I believe she has been well informed regarding the risks/benefits of Exploratory Laparoscopy for pelvic pain. Oophorectomy The option of Oophorectomy has been discussed with the patient.  Detailed risk/benefits have been reviewed.  The risks discussed include, but are not limited to, hemorrhage, infection, damage to ureter or other internal organ, and Ovarian Remnant Syndrome.  The benefits include a significant decrease in the risk of Ovarian Cancer and in benign Ovarian disease.  The risk of Ovarian CA has been estimated at 1 in 70.  This is a relatively small risk.   However, should Ovarian CA develop, it is often found late in the course of the disease.  We have also discussed the role of inheritance in the development of Ovarian disease.  Some women, who have close relatives with Ovarian CA, have a higher than 1 in 70 risk of Ovarian CA.  The benefits of Estrogen replacement therapy following Oophorectomy has been stressed.  If she is premenopausal, we have discussed the fact that this procedure will make her permanently sterile and that premature menopause will result if no ERT is begun.  I have answered all of her questions, and I believe that she has an adequate and informed understanding of the risks and  benefits of Oophorectomy.   Elonda Husky, M.D. 05/26/2016 5:27 PM

## 2016-05-26 NOTE — H&P (Signed)
PRE-OPERATIVE HISTORY AND PHYSICAL EXAM  PCP:  Emi Belfast, FNP  HPI:  [name] is a 36 y.o. [id number].  Patient's last menstrual period was 10/15/2013.  She presents today for a pre-op discussion and PE.  She has the following symptoms:  She presents today stating that she has had 5 days of right-sided pelvic pain that has been getting worse daily. She has been taking Percocet and Motrin to control her pain. She states that she can barely function and it has doubled her over several times a day while trying to work. Significant history includes a history of previous hysterectomy and left oophorectomy as well as a laparoscopy for endometriosis. The patient says this pain is not like her pain from endometriosis. She has also had a previous ovarian cyst rupture and she says does not like that pain either. Ultrasound today reveals a small ovary without cyst however no blood flow whatsoever could be documented to this ovary.  ROS: Constitutional: Denied constitutional symptoms, night sweats, recent illness, fatigue, fever, insomnia and weight loss.  Eyes: Denied eye symptoms, eye pain, photophobia, vision change and visual disturbance.  Ears/Nose/Throat/Neck: Denied ear, nose, throat or neck symptoms, hearing loss, nasal discharge, sinus congestion and sore throat.  Cardiovascular: Denied cardiovascular symptoms, arrhythmia, chest pain/pressure, edema, exercise intolerance, orthopnea and palpitations.  Respiratory: Denied pulmonary symptoms, asthma, pleuritic pain, productive sputum, cough, dyspnea and wheezing.  Gastrointestinal: Denied, gastro-esophageal reflux, melena, nausea and vomiting.  Genitourinary: See HPI for additional information.  Musculoskeletal: Denied musculoskeletal symptoms, stiffness, swelling, muscle weakness and myalgia.  Dermatologic: Denied dermatology symptoms, rash and scar.  Neurologic: Denied neurology symptoms, dizziness, headache, neck pain and syncope.   Psychiatric: Denied psychiatric symptoms, anxiety and depression.  Endocrine: Denied endocrine symptoms including hot flashes and night sweats.    OB History  Gravida Para Term Preterm AB Living  2 2 1 1   2   SAB TAB Ectopic Multiple Live Births          2    # Outcome Date GA Lbr Len/2nd Weight Sex Delivery Anes PTL Lv  2 Preterm 12/02/12 [redacted]w[redacted]d  6 lb 6.8 oz (2.915 kg) F CS-LTranv Spinal  LIV  1 Term 2008 [redacted]w[redacted]d  6 lb 14 oz (3.118 kg) F CS-LTranv Spinal N LIV     Birth Comments:  pre-e on MAG, IOL. failure to progress      Past Medical History:  Diagnosis Date  . Abnormal Pap smear   . Anxiety   . Anxiety   . Cough    HAS COLD. PRODUCTIVE CLEAR. NO FEVER. NO WHEEZING. TO SEE PCP IF WORSENS  . Endometriosis   . HA (headache)    otc med prn  . Hyperlipidemia    diet controlled, no meds  . Hypertension   . PONV (postoperative nausea and vomiting)   . Pregnancy induced hypertension 2008    Past Surgical History:  Procedure Laterality Date  . ABDOMINAL HYSTERECTOMY  7/15   for endometriosis  . ABLATION ON ENDOMETRIOSIS N/A 06/04/2014   Procedure: ABLATION ON ENDOMETRIOSIS;  Surgeon: Leslie Andrea, MD;  Location: [facility];  Service: Gynecology;  Laterality: N/A;  . BIOPSY  12/01/2011   Procedure: BIOPSY;  Surgeon: Leslie Andrea, MD;  Location: [facility];  Service: Gynecology;  Laterality: N/A;  . CESAREAN SECTION  2008  . CESAREAN SECTION WITH BILATERAL TUBAL LIGATION N/A 12/02/2012   Procedure: Repeat Cesarean Section Delivery Baby Girl @ 1826, Apgars 8/9, Bilateral Tubal  Ligation;  Surgeon: Leslie AndreaJames E Tomblin II, MD;  Location: [facility];  Service: Obstetrics;  Laterality: N/A;  . COLPOSCOPY     as teenager, results WNL  . ENDOMETRIAL BIOPSY    . LAPAROSCOPIC BILATERAL SALPINGECTOMY Bilateral 02/15/2015   Procedure: LAPAROSCOPIC BILATERAL SALPINGECTOMY;  Surgeon: Hildred LaserAnika Cherry, MD;  Location: [facility];  Service: Gynecology;  Laterality: Bilateral;  . LAPAROSCOPIC LYSIS OF  ADHESIONS N/A 06/04/2014   Procedure: LAPAROSCOPIC LYSIS OF ADHESIONS;  Surgeon: Leslie AndreaJames E Tomblin II, MD;  Location: [facility];  Service: Gynecology;  Laterality: N/A;  . LAPAROSCOPY  12/01/2011   Procedure: LAPAROSCOPY OPERATIVE;  Surgeon: Leslie AndreaJames E Tomblin II, MD;  Location: [facility];  Service: Gynecology;  Laterality: N/A;  . LAPAROSCOPY N/A 06/04/2014   Procedure: LAPAROSCOPY OPERATIVE;  Surgeon: Leslie AndreaJames E Tomblin II, MD;  Location: [facility];  Service: Gynecology;  Laterality: N/A;  . LAPAROSCOPY N/A 02/14/2016   Procedure: LAPAROSCOPY DIAGNOSTIC WITH BIOPSIES POSSIBLE ADHESIOLYSIS;  Surgeon: Hildred LaserAnika Cherry, MD;  Location: [facility];  Service: Gynecology;  Laterality: N/A;  . OOPHORECTOMY Left   . SALPINGECTOMY Bilateral   . TONSILLECTOMY    . TONSILLECTOMY    . TUBAL LIGATION    . TYMPANOSTOMY TUBE PLACEMENT        SOCIAL HISTORY: History  Smoking Status  . Never Smoker  Smokeless Tobacco  . Never Used   History  Alcohol Use  . Yes    Comment: occasional   History  Drug Use No    Family History  Problem Relation Age of Onset  . Hypertension Father   . Coronary artery disease Paternal Uncle   . Cancer Paternal Grandfather   . Diabetes Neg Hx     ALLERGIES:  Sulfamethoxazole-trimethoprim; Amoxil [amoxicillin]; Azithromycin; Cefprozil; Ceftin [cefuroxime axetil]; Other; Penicillins; and Sulfa antibiotics  Current Outpatient Prescriptions on File Prior to Visit  Medication Sig Dispense Refill  . bisoprolol-hydrochlorothiazide (ZIAC) 10-6.25 MG tablet Take 1 tablet by mouth daily. (Patient taking differently: Take 1 tablet by mouth at bedtime. ) 90 tablet 3  . HYDROcodone-homatropine (HYCODAN) 5-1.5 MG/5ML syrup Take 5 mLs by mouth every 8 (eight) hours as needed for cough. 120 mL 0  . losartan-hydrochlorothiazide (HYZAAR) 100-25 MG tablet Take 1 tablet by mouth daily. 90 tablet 3  . norethindrone (AYGESTIN) 5 MG tablet Take 5 mg by mouth daily.     Marland Kitchen. oxyCODONE-acetaminophen  (PERCOCET/ROXICET) 5-325 MG tablet Take 1-2 tablets by mouth every 6 (six) hours as needed. 90 tablet 0  . traMADol (ULTRAM) 50 MG tablet Take 1 tablet (50 mg total) by mouth every 6 (six) hours as needed for moderate pain. 60 tablet 3  . zolpidem (AMBIEN) 5 MG tablet take 5mg s daily at night 30 tablet 2   No current facility-administered medications on file prior to visit.    No orders of the defined types were placed in this encounter.    Physical examination LMP 10/15/2013   Physical examination General NAD, Conversant  HEENT Atraumatic; Op clear with mmm.  Normo-cephalic. Pupils reactive. Anicteric sclerae  Thyroid/Neck Smooth without nodularity or enlargement. Normal ROM.  Neck Supple.  Skin No rashes, lesions or ulceration. Normal palpated skin turgor. No nodularity.  Breasts: No masses or discharge.  Symmetric.  No axillary adenopathy.  Lungs: Clear to auscultation.No rales or wheezes. Normal Respiratory effort, no retractions.  Heart: NSR.  No murmurs or rubs appreciated. No periferal edema  Abdomen: Soft.  Tender in RLQ.  Guarding present.   No masses.  No HSM.  No hernia  Extremities: Moves all appropriately.  Normal ROM for age. No lymphadenopathy.  Neuro: Oriented to PPT.  Normal mood. Normal affect.       Assessment: 1. Pelvic pain in female   2. Torsion of right ovary and ovarian pedicle   3. Personal history of endometriosis   Acute onset of right-sided pelvic pain with absent flow to the right ovary. Possible ovarian torsion or intermittent torsion, possible endometriosis with adhesions contributing to sudden onset pelvic pain. No evidence of ovarian cyst, no evidence of free fluid.   PLAN: 1.  Exploratory Laparoscopy with possible Right oophorectomy, possible LOA.  Pre-op discussions regarding Risks and Benefits of her scheduled surgery.     1.  LPY for Pain We have discussed the procedure of Exploratory Laparoscopy in detail.  Medical management of pelvic  pain has also been discussed and the patient has decided that this option is not satisfactory for her.  She has elected to have a Laparoscopy performed to attempt to diagnose and manage her pain.  I have informed her that Laparoscopy, like other surgical procedures, entails the following risks:  bleeding, infection, damage to bowel, bladder or other internal organ, and the risk or anesthesia.  She is aware that her risks are not limited to these.  We have discussed the possibility of Endometriosis and Pelvic Adhesive Disease.  We have discussed surgical as well as subsequent medical management of these conditions.  The patient is aware that both of these conditions can often be diagnosed and treated via Laparoscopy.  We have discussed the possibility  of recurrence and that despite adequate treatment at this time, pelvic pain may reoccur in the future.  We have discussed the possibility that Laparoscopy may not diagnose or cure her pain.  It is possible that no diagnosis will be made in up to 50% of cases. I have explained to her that the ovary may or may not be salvageable if it has been without blood flow. She understands that removal of this ovary is more than 50% likely. She is aware that intraoperative blood flow to the ovary is a judgment and that I will use my best judgment to save or remove her ovary. I have answered all of her questions and I believe she has been well informed regarding the risks/benefits of Exploratory Laparoscopy for pelvic pain. Oophorectomy The option of Oophorectomy has been discussed with the patient.  Detailed risk/benefits have been reviewed.  The risks discussed include, but are not limited to, hemorrhage, infection, damage to ureter or other internal organ, and Ovarian Remnant Syndrome.  The benefits include a significant decrease in the risk of Ovarian Cancer and in benign Ovarian disease.  The risk of Ovarian CA has been estimated at 1 in 70.  This is a relatively small risk.   However, should Ovarian CA develop, it is often found late in the course of the disease.  We have also discussed the role of inheritance in the development of Ovarian disease.  Some women, who have close relatives with Ovarian CA, have a higher than 1 in 70 risk of Ovarian CA.  The benefits of Estrogen replacement therapy following Oophorectomy has been stressed.  If she is premenopausal, we have discussed the fact that this procedure will make her permanently sterile and that premature menopause will result if no ERT is begun.  I have answered all of her questions, and I believe that she has an adequate and informed understanding of the risks and  benefits of Oophorectomy.   Elonda Husky, M.D. 05/26/2016 5:27 PM

## 2016-05-27 NOTE — Anesthesia Postprocedure Evaluation (Signed)
Anesthesia Post Note  Patient: [name]  Procedure(s) Performed: Procedure(s) (LRB): LAPAROSCOPY operative with lysis of adhesions (N/A)  Patient location during evaluation: PACU Anesthesia Type: General Level of consciousness: awake and alert Pain management: pain level controlled Vital Signs Assessment: post-procedure vital signs reviewed and stable Respiratory status: spontaneous breathing, nonlabored ventilation, respiratory function stable and patient connected to nasal cannula oxygen Cardiovascular status: blood pressure returned to baseline and stable Postop Assessment: no signs of nausea or vomiting Anesthetic complications: no     Last Vitals:  Vitals:   05/26/16 2140 05/26/16 2150  BP: (!) 145/90 (!) 165/99  Pulse: 94 90  Resp: 18 20  Temp:  36.8 C    Last Pain:  Vitals:   05/26/16 2150  TempSrc:   PainSc: 2                  Yevette EdwardsJames G Adams

## 2016-05-29 ENCOUNTER — Encounter: Payer: Self-pay | Admitting: Obstetrics and Gynecology

## 2016-05-29 ENCOUNTER — Ambulatory Visit: Payer: BLUE CROSS/BLUE SHIELD | Admitting: Family Medicine

## 2016-06-06 ENCOUNTER — Encounter: Payer: BLUE CROSS/BLUE SHIELD | Admitting: Obstetrics and Gynecology

## 2016-06-07 ENCOUNTER — Ambulatory Visit (INDEPENDENT_AMBULATORY_CARE_PROVIDER_SITE_OTHER): Payer: BLUE CROSS/BLUE SHIELD | Admitting: Obstetrics and Gynecology

## 2016-06-07 ENCOUNTER — Encounter: Payer: Self-pay | Admitting: Obstetrics and Gynecology

## 2016-06-07 ENCOUNTER — Encounter: Payer: Self-pay | Admitting: Family Medicine

## 2016-06-07 ENCOUNTER — Ambulatory Visit (INDEPENDENT_AMBULATORY_CARE_PROVIDER_SITE_OTHER): Payer: BLUE CROSS/BLUE SHIELD | Admitting: Family Medicine

## 2016-06-07 VITALS — HR 87 | Temp 98.2°F | Ht 65.0 in | Wt 212.8 lb

## 2016-06-07 VITALS — BP 151/95 | HR 83 | Ht 65.0 in | Wt 213.1 lb

## 2016-06-07 DIAGNOSIS — R102 Pelvic and perineal pain: Secondary | ICD-10-CM | POA: Diagnosis not present

## 2016-06-07 DIAGNOSIS — Z9889 Other specified postprocedural states: Secondary | ICD-10-CM

## 2016-06-07 DIAGNOSIS — Z8041 Family history of malignant neoplasm of ovary: Secondary | ICD-10-CM | POA: Diagnosis not present

## 2016-06-07 DIAGNOSIS — F419 Anxiety disorder, unspecified: Secondary | ICD-10-CM

## 2016-06-07 DIAGNOSIS — I10 Essential (primary) hypertension: Secondary | ICD-10-CM | POA: Diagnosis not present

## 2016-06-07 DIAGNOSIS — Z8742 Personal history of other diseases of the female genital tract: Secondary | ICD-10-CM

## 2016-06-07 DIAGNOSIS — M25552 Pain in left hip: Secondary | ICD-10-CM

## 2016-06-07 MED ORDER — MELOXICAM 15 MG PO TABS: ORAL_TABLET | ORAL | 0 refills | Status: DC

## 2016-06-07 MED ORDER — LEVONORGEST-ETH ESTRAD 91-DAY 0.15-0.03 &0.01 MG PO TABS: 1.0000 | ORAL_TABLET | Freq: Every day | ORAL | 1 refills | Status: DC

## 2016-06-07 NOTE — Progress Notes (Signed)
HPI:      [name] is a 36 y.o. [id number] who LMP was Patient's last menstrual period was 10/15/2013.  Subjective: She presents today post operative from laparoscopy for acute onset of pelvic pain possible torsion. She is doing well has resumed normal activities. She states that she has had ovarian cysts with cyst rupture in the past. She has a history of endometriosis and is currently taking hormonal medications to prevent recurrence. She has described a family history of a grandmother who died of ovarian cancer.    Hx: The following portions of the patient's history were reviewed and updated as appropriate:            She  has a past medical history of Abnormal Pap smear; Anxiety; Anxiety; Cough; Endometriosis; HA (headache); Hyperlipidemia; Hypertension; PONV (postoperative nausea and vomiting); and Pregnancy induced hypertension (2008). She  does not have any pertinent problems on file. She  has a past surgical history that includes Tonsillectomy; Tympanostomy tube placement; Cesarean section (2008); laparoscopy (12/01/2011); biopsy (12/01/2011); Endometrial biopsy; Colposcopy; Cesarean section with bilateral tubal ligation (N/A, 12/02/2012); Abdominal hysterectomy (7/15); Tubal ligation; Tonsillectomy; laparoscopy (N/A, 06/04/2014); Laparoscopic lysis of adhesions (N/A, 06/04/2014); Ablation on endometriosis (N/A, 06/04/2014); Laparoscopic bilateral salpingectomy (Bilateral, 02/15/2015); Oophorectomy (Left); Salpingectomy (Bilateral); laparoscopy (N/A, 02/14/2016); and laparoscopy (N/A, 05/26/2016). Her family history includes Breast cancer in her paternal aunt; Cancer in her paternal grandfather; Coronary artery disease in her paternal uncle; Hypertension in her father. She  reports that she has never smoked. She has never used smokeless tobacco. She reports that she drinks alcohol. She reports that she does not use drugs. Current Outpatient Prescriptions on File Prior to Visit  Medication  Sig Dispense Refill  . bisoprolol-hydrochlorothiazide (ZIAC) 10-6.25 MG tablet Take 1 tablet by mouth daily. (Patient taking differently: Take 1 tablet by mouth at bedtime. ) 90 tablet 3  . ibuprofen (ADVIL,MOTRIN) 200 MG tablet Take 3 tablets (600 mg total) by mouth every 6 (six) hours as needed for mild pain. 30 tablet 0  . losartan-hydrochlorothiazide (HYZAAR) 100-25 MG tablet Take 1 tablet by mouth daily. 90 tablet 3  . zolpidem (AMBIEN) 5 MG tablet take 5mg s daily at night 30 tablet 2   No current facility-administered medications on file prior to visit.           ROS: Constitutional: Denied constitutional symptoms, night sweats, recent illness, fatigue, fever, insomnia and weight loss.  Eyes: Denied eye symptoms, eye pain, photophobia, vision change and visual disturbance.  Ears/Nose/Throat/Neck: Denied ear, nose, throat or neck symptoms, hearing loss, nasal discharge, sinus congestion and sore throat.  Cardiovascular: Denied cardiovascular symptoms, arrhythmia, chest pain/pressure, edema, exercise intolerance, orthopnea and palpitations.  Respiratory: Denied pulmonary symptoms, asthma, pleuritic pain, productive sputum, cough, dyspnea and wheezing.  Gastrointestinal: Denied, gastro-esophageal reflux, melena, nausea and vomiting.  Genitourinary: Denied genitourinary symptoms including symptomatic vaginal discharge, pelvic relaxation issues, and urinary complaints.  Musculoskeletal: Denied musculoskeletal symptoms, stiffness, swelling, muscle weakness and myalgia.  Dermatologic: Denied dermatology symptoms, rash and scar.  Neurologic: Denied neurology symptoms, dizziness, headache, neck pain and syncope.  Psychiatric: Denied psychiatric symptoms, anxiety and depression.  Endocrine: Denied endocrine symptoms including hot flashes and night sweats.   Meds: She has a current medication list which includes the following prescription(s): bisoprolol-hydrochlorothiazide, ibuprofen,  losartan-hydrochlorothiazide, tramadol, and zolpidem.  Objective: Vitals:   06/07/16 0933  BP: (!) 151/95  Pulse: 83             Abdomen: Soft.  Non-tender.  No  masses.  No HSM.  Incision/s: Intact.  Healing well.  No erythema.  No drainage.   I reviewed the pictures from laparoscopy with her in detail. Adhesions and ovarian viability discussed.  Assessment: 1. Pelvic pain in female   2. Post-operative state   3. Family history of ovarian cancer   4. Personal history of endometriosis     Patient doing very well postop.   Plan:             1.  We have discussed multiple options going forward regarding suppression of endometriosis, prevention of ovarian cyst formation, slight risk of family ovarian cancer. She has decided after long discussion that OCPs sound like a good solution to her. These should prevent the cysts, suppress endometriosis, and decreased risk for ovarian cancer in the long run. OCPs The risks /benefits of OCPs have been explained to the patient in detail.  Product literature has been given to her.  I have instructed her in the use of OCPs and have given her literature reinforcing this information. She has been made aware of  the fact that other medications may affect the efficacy of OCPs.  I have answered all of her questions, and I believe that she has an understanding of the effectiveness and use of OCPs. We have discussed her hypertension and I have recommended that once she starts OCPs she have her blood pressure checked at least weekly for several weeks to determine if a change in blood pressure medication dosing is required.         F/U  Return in about 3 months (around 09/04/2016). I spent 27 minutes with this patient of which greater than 50% was spent discussing her recent surgery, findings at surgery, endometriosis, OCPs, ovarian cysts, ovarian cancer.  Elonda Husky, M.D. 06/07/2016 10:10 AM

## 2016-06-07 NOTE — Progress Notes (Signed)
Pre visit review using our clinic review tool, if applicable. No additional management support is needed unless otherwise documented below in the visit note. 

## 2016-06-07 NOTE — Addendum Note (Signed)
Addended by: Brooke DareSICK, Britlyn Martine L on: 06/07/2016 04:43 PM   Modules accepted: Orders

## 2016-06-07 NOTE — Patient Instructions (Signed)
I have sent in a prescription for a once a day anti-inflammatory Please do exercises daily, if not better in 4 weeks, let's have you see Dr. Berline Choughigby- our sports medicine provider

## 2016-06-07 NOTE — Progress Notes (Signed)
Subjective:    Patient ID: [name], female    DOB: [birth year], 36 y.o.   MRN: [id number]  HPI This is a pleasant 36 yo female who presents today to establish care and for left hip pain x several months. Pain is anterior and radiates toward knee. Pain wakes her up at night. Pain is throbbing, some pain at buttock. No unusual activity. Worse after being on her feet, feels stiff in the mornings. Pain worse after sleeping on left side. Heat provides temporary relief. Has taken ibuprofen 600 mg 1-2 times a day. No relief with ibuprofen or methocarbamol. Occasionally takes tramadol for endometriosis pain with some relief- she tries to take this sparingly. No known injury or overuse, no new activities. Rode horses as child from age [age], no recent exercise, tries to walk some. No bowel or bladder changes.   Recently had surgery for suspected ovarian torsion. Had post op visit today. Doing well. Dr. Logan BoresEvans is changing her hormone combination.   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 She has ongoing marital problems and has noted increased anxiety lately. Has two daughters. Good family support and friends. Has been to counseling through work which was helpful. She has been contemplating separating from her husband. He has been unwilling to go with her to counseling.   Past Medical History:  Diagnosis Date  . Abnormal Pap smear   . Anxiety   . Anxiety   . Cough    HAS COLD. PRODUCTIVE CLEAR. NO FEVER. NO WHEEZING. TO SEE PCP IF WORSENS  . Endometriosis   . HA (headache)    otc med prn  . Hyperlipidemia    diet controlled, no meds  . Hypertension   . PONV (postoperative nausea and vomiting)   . Pregnancy induced hypertension 2008   Past Surgical History:  Procedure Laterality Date  . ABDOMINAL HYSTERECTOMY  7/15   for endometriosis  . ABLATION ON ENDOMETRIOSIS N/A 06/04/2014   Procedure: ABLATION ON ENDOMETRIOSIS;  Surgeon: Leslie AndreaJames E Tomblin II, MD;   Location: [facility];  Service: Gynecology;  Laterality: N/A;  . BIOPSY  12/01/2011   Procedure: BIOPSY;  Surgeon: Leslie AndreaJames E Tomblin II, MD;  Location: [facility];  Service: Gynecology;  Laterality: N/A;  . CESAREAN SECTION  2008  . CESAREAN SECTION WITH BILATERAL TUBAL LIGATION N/A 12/02/2012   Procedure: Repeat Cesarean Section Delivery Baby Girl @ 1826, Apgars 8/9, Bilateral Tubal Ligation;  Surgeon: Leslie AndreaJames E Tomblin II, MD;  Location: [facility];  Service: Obstetrics;  Laterality: N/A;  . COLPOSCOPY     as teenager, results WNL  . ENDOMETRIAL BIOPSY    . LAPAROSCOPIC BILATERAL SALPINGECTOMY Bilateral 02/15/2015   Procedure: LAPAROSCOPIC BILATERAL SALPINGECTOMY;  Surgeon: Hildred LaserAnika Cherry, MD;  Location: [facility];  Service: Gynecology;  Laterality: Bilateral;  . LAPAROSCOPIC LYSIS OF ADHESIONS N/A 06/04/2014   Procedure: LAPAROSCOPIC LYSIS OF ADHESIONS;  Surgeon: Leslie AndreaJames E Tomblin II, MD;  Location: [facility];  Service: Gynecology;  Laterality: N/A;  . LAPAROSCOPY  12/01/2011   Procedure: LAPAROSCOPY OPERATIVE;  Surgeon: Leslie AndreaJames E Tomblin II, MD;  Location: [facility];  Service: Gynecology;  Laterality: N/A;  . LAPAROSCOPY N/A 06/04/2014   Procedure: LAPAROSCOPY OPERATIVE;  Surgeon: Leslie AndreaJames E Tomblin II, MD;  Location: [facility];  Service: Gynecology;  Laterality: N/A;  . LAPAROSCOPY N/A 02/14/2016   Procedure: LAPAROSCOPY DIAGNOSTIC WITH BIOPSIES POSSIBLE ADHESIOLYSIS;  Surgeon: Hildred LaserAnika Cherry, MD;  Location: [facility];  Service: Gynecology;  Laterality: N/A;  .  LAPAROSCOPY N/A 05/26/2016   Procedure: LAPAROSCOPY operative with lysis of adhesions;  Surgeon: Linzie Collin, MD;  Location: [facility];  Service: Gynecology;  Laterality: N/A;  . OOPHORECTOMY Left   . SALPINGECTOMY Bilateral   . TONSILLECTOMY    . TONSILLECTOMY    . TUBAL LIGATION    . TYMPANOSTOMY TUBE PLACEMENT     Family History  Problem Relation Age of Onset  . Hypertension Father   . Coronary artery disease Paternal Uncle   . Cancer Paternal Grandfather     . Breast cancer Paternal Aunt   . Diabetes Neg Hx    Social History  Substance Use Topics  . Smoking status: Never Smoker  . Smokeless tobacco: Never Used  . Alcohol use Yes     Comment: occasional      Review of Systems Per HPI    Objective:   Physical Exam  Constitutional: She is oriented to person, place, and time. She appears well-developed and well-nourished. No distress.  Obese.   HENT:  Head: Normocephalic and atraumatic.  Eyes: Conjunctivae are normal.  Neck: Normal range of motion. Neck supple.  Cardiovascular: Normal rate.   Pulmonary/Chest: Effort normal.  Musculoskeletal:       Right hip: Normal.       Left hip: She exhibits decreased strength and tenderness. She exhibits normal range of motion.  Neurological: She is alert and oriented to person, place, and time.  Skin: Skin is warm and dry.  Psychiatric: She has a normal mood and affect. Her behavior is normal. Judgment and thought content normal.  Vitals reviewed.     Pulse 87   Ht 5\' 5"  (1.651 m)   Wt 212 lb 12.8 oz (96.5 kg)   LMP 10/15/2013   SpO2 99%   BMI 35.41 kg/m  Wt Readings from Last 3 Encounters:  06/07/16 212 lb 12.8 oz (96.5 kg)  06/07/16 213 lb 1 oz (96.6 kg)  05/26/16 218 lb (98.9 kg)   BP Readings from Last 3 Encounters:  06/07/16 (!) 151/95  05/26/16 (!) 165/99  05/05/16 (!) 160/110        Assessment & Plan:  1. Left hip pain - discussed with Dr. Berline Chough who agreed with plan - Provided written and verbal information regarding diagnosis and treatment. - hip exercise instructions provided to patient and she was encouraged to perform daily - meloxicam (MOBIC) 15 MG tablet; Take one tablet every 24 hours for 7-10 days then daily as needed  Dispense: 30 tablet; Refill: 0 - follow up in 1 month  2. Anxiety - discussed therapy and medication. She will reach out to employee resource available to her through her job, she will let me know if she needs any additional  referral/recomendations. She is starting new hormone regimen and will see how her mood does with that prior to starting any additional medication.  3. Essential (primary) hypertension - blood pressure noted to be elevated at several recent visits, will add amlodipine and check in 2 weeks. - amLODipine (NORVASC) 5 MG tablet; Take 1 tablet (5 mg total) by mouth daily.  Dispense: 30 tablet; Refill: 1   Olean Ree, FNP-BC  [facility] [place], [facility]  06/09/2016 4:20 PM

## 2016-06-08 ENCOUNTER — Encounter: Payer: Self-pay | Admitting: Obstetrics and Gynecology

## 2016-06-09 ENCOUNTER — Encounter: Payer: Self-pay | Admitting: Family Medicine

## 2016-06-09 MED ORDER — AMLODIPINE BESYLATE 5 MG PO TABS: 5.0000 mg | ORAL_TABLET | Freq: Every day | ORAL | 1 refills | Status: DC

## 2016-06-12 ENCOUNTER — Other Ambulatory Visit: Payer: Self-pay | Admitting: Obstetrics and Gynecology

## 2016-06-12 ENCOUNTER — Ambulatory Visit: Payer: BLUE CROSS/BLUE SHIELD | Admitting: Family Medicine

## 2016-06-12 MED ORDER — GABAPENTIN 300 MG PO CAPS: 300.0000 mg | ORAL_CAPSULE | Freq: Three times a day (TID) | ORAL | 1 refills | Status: DC

## 2016-06-14 ENCOUNTER — Ambulatory Visit: Payer: BLUE CROSS/BLUE SHIELD | Admitting: Family Medicine

## 2016-06-20 ENCOUNTER — Encounter: Payer: Self-pay | Admitting: Sports Medicine

## 2016-06-20 ENCOUNTER — Ambulatory Visit (INDEPENDENT_AMBULATORY_CARE_PROVIDER_SITE_OTHER): Payer: BLUE CROSS/BLUE SHIELD

## 2016-06-20 ENCOUNTER — Ambulatory Visit (INDEPENDENT_AMBULATORY_CARE_PROVIDER_SITE_OTHER): Payer: BLUE CROSS/BLUE SHIELD | Admitting: Sports Medicine

## 2016-06-20 VITALS — BP 120/90 | HR 98 | Ht 65.0 in | Wt 216.8 lb

## 2016-06-20 DIAGNOSIS — M5442 Lumbago with sciatica, left side: Secondary | ICD-10-CM

## 2016-06-20 DIAGNOSIS — G8929 Other chronic pain: Secondary | ICD-10-CM

## 2016-06-20 DIAGNOSIS — M4726 Other spondylosis with radiculopathy, lumbar region: Secondary | ICD-10-CM

## 2016-06-20 MED ORDER — METHYLPREDNISOLONE 4 MG PO TBPK: ORAL_TABLET | ORAL | 0 refills | Status: DC

## 2016-06-20 MED ORDER — GABAPENTIN 300 MG PO CAPS: 600.0000 mg | ORAL_CAPSULE | Freq: Three times a day (TID) | ORAL | 1 refills | Status: DC

## 2016-06-20 NOTE — Patient Instructions (Signed)
Please perform the exercise program that [name] has prepared for you and gone over in detail on a daily basis.  In addition to the handout you were provided you can access your program through: [email or web address]   Your unique program code is: [email or web address]

## 2016-06-20 NOTE — Assessment & Plan Note (Signed)
Mild degenerative change of lower lumbar segments with likely left-sided L5 radiculitis. Medrol Dosepak and titration of gabapentin. Therapeutic exercises working on core stabilization reviewed in detail with Donzetta KohutJames Jefferson, ATC

## 2016-06-20 NOTE — Progress Notes (Signed)
Terri MaladyJennifer L Munoz - 36 y.o. female MRN [id number]  Date of birth: October 25, 1980  Office Visit Note: Visit Date: 06/20/2016 PCP: Emi Belfasteborah B Gessner, FNP Referred by: Emi BelfastGessner, Deborah B, FNP  Subjective: Chief Complaint  Patient presents with  . Left Hip pain    Pain does radiate to down the front and back of left leg. Denies any injury. Sx have worsened over the past few weeks. Pain is described as a constant/dull pain worsening with sitting and laying down. Relief with walking.    HPI: Patient presents with 2 months of significant worsening left posterior leg hip and buttock pain that is radiating down the posterior aspect of her leg into the great toe.  This is been present once again for several years but is worsened.  This is occasionally waking her at night.  The pain seems to be most focal on the lateral aspect of her leg at night.  She does have symptoms of neurogenic claudication that improved with using a shopping cart while ambulating.  Otherwise generally she feels better with movement.  She has not been performing any specific therapeutic exercises.  No significant improvement with meloxicam.  She has a history significant for endometriosis is undergoing gynecologic evaluation for this but does not feel as if this is related to this time. ROS: Denies fevers, chills, recent weight gain or weight loss.  No night sweats. No significant nighttime awakenings due to this issue.  Otherwise per HPI.  Objective:  VS:  HT:5\' 5"  (165.1 cm)   WT:216 lb 12.8 oz (98.3 kg)  BMI:36.2    BP:120/90  HR:98bpm  TEMP: ( )  RESP:96 % Physical Exam: GENERAL:  WDWN, NAD, Non-toxic appearing PSYCH:  Alert & appropriately interactive  Not depressed or anxious appearing LOWER EXTREMITIES:  No significant rashes/lesions/ulcerations overlying the legs.  No significant pretibial edema.  No clubbing or cyanosis.  DP & PT pulses 2+/4.  Sensation intact to light touch. BACK:   Mildly positive  left straight leg raise.  No significant midline tenderness.  TTP over left paraspinal musculature and left PSIS  Good internal and external rotation of the hips.  Manual muscle testing is 5+/5 in BLE myotomes without focality  Lower extremity DTRs 1+/4 diffusely and symmetric    Imaging & Procedures: Preliminary x-ray read: 3 view lumbar spine: Slight scoliosis with mild degenerative change of lower lumbar segments most focally over L5-S1.   PROCEDURE NOTE: THERAPEUTIC EXERCISES (97110) 15 minutes spent for Therapeutic exercises as stated in above notes.  This included exercises focusing on stretching, strengthening, with significant focus on eccentric aspects.   Proper technique shown and discussed handout in great detail with ATC.  All questions were discussed and answered.    Assessment & Plan: Problem List Items Addressed This Visit    Osteoarthritis of spine with radiculopathy, lumbar region    Mild degenerative change of lower lumbar segments with likely left-sided L5 radiculitis. Medrol Dosepak and titration of gabapentin. Therapeutic exercises working on core stabilization reviewed in detail with Donzetta KohutJames Jefferson, ATC      Relevant Medications   gabapentin (NEURONTIN) 300 MG capsule   methylPREDNISolone (MEDROL DOSEPAK) 4 MG TBPK tablet    Other Visit Diagnoses    Chronic left-sided low back pain with left-sided sciatica    -  Primary   Relevant Medications   methylPREDNISolone (MEDROL DOSEPAK) 4 MG TBPK tablet   Other Relevant Orders   DG Lumbar Spine 2-3 Views      Follow-up: Return  in about 6 weeks (around 08/01/2016).   Past Medical/Family/Surgical/Social History: Medications & Allergies reviewed per EMR Patient Active Problem List   Diagnosis Date Noted  . Osteoarthritis of spine with radiculopathy, lumbar region 06/20/2016  . Strep pharyngitis 03/30/2016  . H/O abdominal surgery 02/09/2016  . Viral upper respiratory infection 08/20/2015  . Chronic  pelvic pain in female 11/30/2014  . Endometriosis 11/30/2014  . Anxiety 09/01/2014  . Essential (primary) hypertension 09/01/2014  . Cyst of ovary 09/01/2014  . Class 2 obesity 09/01/2014  . Headache 07/22/2014  . Abdominal cramping 12/11/2013  . Anxiety state, unspecified 12/11/2013  . Routine general medical examination at a health care facility 04/06/2011  . Metabolic syndrome 04/06/2011  . Insomnia 12/20/2007  . ALLERGIC RHINITIS 07/18/2007  . HYPERLIPIDEMIA 02/19/2007   Past Medical History:  Diagnosis Date  . Abnormal Pap smear   . Anxiety   . Anxiety   . Cough    HAS COLD. PRODUCTIVE CLEAR. NO FEVER. NO WHEEZING. TO SEE PCP IF WORSENS  . Endometriosis   . HA (headache)    otc med prn  . Hyperlipidemia    diet controlled, no meds  . Hypertension   . PONV (postoperative nausea and vomiting)   . Pregnancy induced hypertension 2008   Family History  Problem Relation Age of Onset  . Hypertension Father   . Coronary artery disease Paternal Uncle   . Cancer Paternal Grandfather   . Breast cancer Paternal Aunt   . Diabetes Neg Hx    Past Surgical History:  Procedure Laterality Date  . ABDOMINAL HYSTERECTOMY  7/15   for endometriosis  . ABLATION ON ENDOMETRIOSIS N/A 06/04/2014   Procedure: ABLATION ON ENDOMETRIOSIS;  Surgeon: Leslie Andrea, MD;  Location: [facility];  Service: Gynecology;  Laterality: N/A;  . BIOPSY  12/01/2011   Procedure: BIOPSY;  Surgeon: Leslie Andrea, MD;  Location: [facility];  Service: Gynecology;  Laterality: N/A;  . CESAREAN SECTION  2008  . CESAREAN SECTION WITH BILATERAL TUBAL LIGATION N/A 12/02/2012   Procedure: Repeat Cesarean Section Delivery Baby Girl @ 1826, Apgars 8/9, Bilateral Tubal Ligation;  Surgeon: Leslie Andrea, MD;  Location: [facility];  Service: Obstetrics;  Laterality: N/A;  . COLPOSCOPY     as teenager, results WNL  . ENDOMETRIAL BIOPSY    . LAPAROSCOPIC BILATERAL SALPINGECTOMY Bilateral 02/15/2015   Procedure:  LAPAROSCOPIC BILATERAL SALPINGECTOMY;  Surgeon: Hildred Laser, MD;  Location: [facility];  Service: Gynecology;  Laterality: Bilateral;  . LAPAROSCOPIC LYSIS OF ADHESIONS N/A 06/04/2014   Procedure: LAPAROSCOPIC LYSIS OF ADHESIONS;  Surgeon: Leslie Andrea, MD;  Location: [facility];  Service: Gynecology;  Laterality: N/A;  . LAPAROSCOPY  12/01/2011   Procedure: LAPAROSCOPY OPERATIVE;  Surgeon: Leslie Andrea, MD;  Location: [facility];  Service: Gynecology;  Laterality: N/A;  . LAPAROSCOPY N/A 06/04/2014   Procedure: LAPAROSCOPY OPERATIVE;  Surgeon: Leslie Andrea, MD;  Location: [facility];  Service: Gynecology;  Laterality: N/A;  . LAPAROSCOPY N/A 02/14/2016   Procedure: LAPAROSCOPY DIAGNOSTIC WITH BIOPSIES POSSIBLE ADHESIOLYSIS;  Surgeon: Hildred Laser, MD;  Location: [facility];  Service: Gynecology;  Laterality: N/A;  . LAPAROSCOPY N/A 05/26/2016   Procedure: LAPAROSCOPY operative with lysis of adhesions;  Surgeon: Linzie Collin, MD;  Location: [facility];  Service: Gynecology;  Laterality: N/A;  . OOPHORECTOMY Left   . SALPINGECTOMY Bilateral   . TONSILLECTOMY    . TONSILLECTOMY    . TUBAL LIGATION    .  TYMPANOSTOMY TUBE PLACEMENT     Social History   Occupational History  . 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 Smoking status: Never Smoker  . Smokeless tobacco: Never Used  . Alcohol use Yes     Comment: occasional  . Drug use: No  . Sexual activity: Yes    Birth control/ protection: Surgical

## 2016-06-20 NOTE — Progress Notes (Signed)
Pre visit review using our clinic review tool, if applicable. No additional management support is needed unless otherwise documented below in the visit note. 

## 2016-06-22 ENCOUNTER — Encounter: Payer: Self-pay | Admitting: Obstetrics and Gynecology

## 2016-06-22 ENCOUNTER — Encounter: Payer: Self-pay | Admitting: Sports Medicine

## 2016-06-22 ENCOUNTER — Encounter: Payer: Self-pay | Admitting: Family Medicine

## 2016-06-23 NOTE — Telephone Encounter (Signed)
Called and spoke with patient regarding her symptoms as above.  She reports significant improvement today.  We will have her continue to take the Medrol Dosepak and gabapentin as well as perform therapeutic exercises.  If any lack of improvement by mid week next week with any worsening weakness would consider MRI of the lumbar spine versus reevaluation.  She will call let us know if there are any further issues.

## 2016-06-28 ENCOUNTER — Other Ambulatory Visit: Payer: Self-pay | Admitting: Obstetrics and Gynecology

## 2016-06-30 ENCOUNTER — Other Ambulatory Visit: Payer: Self-pay | Admitting: Obstetrics and Gynecology

## 2016-06-30 ENCOUNTER — Encounter: Payer: Self-pay | Admitting: Obstetrics and Gynecology

## 2016-06-30 MED ORDER — ZOLPIDEM TARTRATE 5 MG PO TABS: 5.0000 mg | ORAL_TABLET | Freq: Every evening | ORAL | 3 refills | Status: DC | PRN

## 2016-06-30 MED ORDER — TRAMADOL HCL 50 MG PO TABS: 50.0000 mg | ORAL_TABLET | Freq: Four times a day (QID) | ORAL | 1 refills | Status: DC | PRN

## 2016-07-05 ENCOUNTER — Ambulatory Visit (INDEPENDENT_AMBULATORY_CARE_PROVIDER_SITE_OTHER): Payer: BLUE CROSS/BLUE SHIELD | Admitting: Obstetrics and Gynecology

## 2016-07-05 ENCOUNTER — Encounter: Payer: Self-pay | Admitting: Obstetrics and Gynecology

## 2016-07-05 VITALS — BP 132/85 | HR 83 | Ht 65.0 in | Wt 219.0 lb

## 2016-07-05 DIAGNOSIS — N930 Postcoital and contact bleeding: Secondary | ICD-10-CM

## 2016-07-05 DIAGNOSIS — Z13220 Encounter for screening for lipoid disorders: Secondary | ICD-10-CM

## 2016-07-05 DIAGNOSIS — N809 Endometriosis, unspecified: Secondary | ICD-10-CM

## 2016-07-05 DIAGNOSIS — R102 Pelvic and perineal pain: Secondary | ICD-10-CM

## 2016-07-05 DIAGNOSIS — M5432 Sciatica, left side: Secondary | ICD-10-CM

## 2016-07-05 DIAGNOSIS — Z131 Encounter for screening for diabetes mellitus: Secondary | ICD-10-CM

## 2016-07-05 DIAGNOSIS — N941 Unspecified dyspareunia: Secondary | ICD-10-CM

## 2016-07-05 DIAGNOSIS — Z01419 Encounter for gynecological examination (general) (routine) without abnormal findings: Secondary | ICD-10-CM

## 2016-07-05 NOTE — Progress Notes (Signed)
GYNECOLOGY ANNUAL PHYSICAL EXAM PROGRESS NOTE  Subjective:    [name] is a 36 y.o. 142P1102 female with h/o endometriosis who presents for an annual exam.  The patient is sexually active. The patient wears seatbelts: yes. The patient participates in regular exercise: no. Has the patient ever been transfused or tattooed?: no. The patient reports that there is not domestic violence in her life.    The patient has complaints today of continued pelvic pain on right side.  Also noting back pain that was radiating down to her left leg.  Has been seen by PCP who recommended referral to sports physical therapist where she had an X-ray of her back, and was diagnosed with sciatic pain. The pain on her right side is in the lower pelvis.  Desires to discuss removal of remaining ovary. Also still noting dyspareunia and occasional postcoital bleeding.   Gynecologic History Menarche age: [age] Patient's last menstrual period was 10/15/2013. Contraception: status post hysterectomy History of STI's: Denies Last Pap: 2012. Results were: normal.  Denies h/o abnormal pap smears.    Obstetric History   G2   P2   T1   P1   A0   L2    SAB0   TAB0   Ectopic0   Multiple0   Live Births2     # Outcome Date GA Lbr Len/2nd Weight Sex Delivery Anes PTL Lv  2 Preterm 12/02/12 5817w5d  6 lb 6.8 oz (2.915 kg) F CS-LTranv Spinal  LIV     Name: Stadel,GIRL Cherrish     Apgar1:  8                Apgar5: 9  1 Term 2008 6357w0d  6 lb 14 oz (3.118 kg) F CS-LTranv Spinal N LIV      Past Medical History:  Diagnosis Date  . Abnormal Pap smear   . Anxiety   . Anxiety   . Cough    HAS COLD. PRODUCTIVE CLEAR. NO FEVER. NO WHEEZING. TO SEE PCP IF WORSENS  . Endometriosis   . HA (headache)    otc med prn  . Hyperlipidemia    diet controlled, no meds  . Hypertension   . PONV (postoperative nausea and vomiting)   . Pregnancy induced hypertension 2008    Past Surgical History:  Procedure Laterality Date  .  ABDOMINAL HYSTERECTOMY  7/15   for endometriosis  . ABLATION ON ENDOMETRIOSIS N/A 06/04/2014   Procedure: ABLATION ON ENDOMETRIOSIS;  Surgeon: Leslie AndreaJames E Tomblin II, MD;  Location: [facility];  Service: Gynecology;  Laterality: N/A;  . BIOPSY  12/01/2011   Procedure: BIOPSY;  Surgeon: Leslie AndreaJames E Tomblin II, MD;  Location: [facility];  Service: Gynecology;  Laterality: N/A;  . CESAREAN SECTION  2008  . CESAREAN SECTION WITH BILATERAL TUBAL LIGATION N/A 12/02/2012   Procedure: Repeat Cesarean Section Delivery Baby Girl @ 1826, Apgars 8/9, Bilateral Tubal Ligation;  Surgeon: Leslie AndreaJames E Tomblin II, MD;  Location: [facility];  Service: Obstetrics;  Laterality: N/A;  . COLPOSCOPY     as teenager, results WNL  . ENDOMETRIAL BIOPSY    . LAPAROSCOPIC BILATERAL SALPINGECTOMY Bilateral 02/15/2015   Procedure: LAPAROSCOPIC BILATERAL SALPINGECTOMY;  Surgeon: Hildred LaserAnika Lamonta Cypress, MD;  Location: [facility];  Service: Gynecology;  Laterality: Bilateral;  . LAPAROSCOPIC LYSIS OF ADHESIONS N/A 06/04/2014   Procedure: LAPAROSCOPIC LYSIS OF ADHESIONS;  Surgeon: Leslie AndreaJames E Tomblin II, MD;  Location: [facility];  Service: Gynecology;  Laterality: N/A;  . LAPAROSCOPY  12/01/2011  Procedure: LAPAROSCOPY OPERATIVE;  Surgeon: Leslie Andrea, MD;  Location: [facility];  Service: Gynecology;  Laterality: N/A;  . LAPAROSCOPY N/A 06/04/2014   Procedure: LAPAROSCOPY OPERATIVE;  Surgeon: Leslie Andrea, MD;  Location: [facility];  Service: Gynecology;  Laterality: N/A;  . LAPAROSCOPY N/A 02/14/2016   Procedure: LAPAROSCOPY DIAGNOSTIC WITH BIOPSIES POSSIBLE ADHESIOLYSIS;  Surgeon: Hildred Laser, MD;  Location: [facility];  Service: Gynecology;  Laterality: N/A;  . LAPAROSCOPY N/A 05/26/2016   Procedure: LAPAROSCOPY operative with lysis of adhesions;  Surgeon: Linzie Collin, MD;  Location: [facility];  Service: Gynecology;  Laterality: N/A;  . OOPHORECTOMY Left   . SALPINGECTOMY Bilateral   . TONSILLECTOMY    . TONSILLECTOMY    . TUBAL LIGATION    . TYMPANOSTOMY  TUBE PLACEMENT      Family History  Problem Relation Age of Onset  . Hypertension Father   . Coronary artery disease Paternal Uncle   . Cancer Paternal Grandfather   . Hypertension Mother   . Breast cancer Paternal Aunt   . Ovarian cancer Maternal Grandmother   . Diabetes Neg Hx     Social History   Social History  . Marital status: Married    Spouse name: N/A  . Number of children: 1  . Years of education: N/A   Occupational History  . 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 Smoking status: Never Smoker  . Smokeless tobacco: Never Used  . Alcohol use Yes     Comment: occasional  . Drug use: No  . Sexual activity: Yes    Birth control/ protection: Surgical, Pill   Other Topics Concern  . Not on file   Social History Narrative  . No narrative on file    Current Outpatient Prescriptions on File Prior to Visit  Medication Sig Dispense Refill  . amLODipine (NORVASC) 5 MG tablet Take 1 tablet (5 mg total) by mouth daily. 30 tablet 1  . gabapentin (NEURONTIN) 300 MG capsule Take 2 capsules (600 mg total) by mouth 3 (three) times daily. 90 capsule 1  . ibuprofen (ADVIL,MOTRIN) 200 MG tablet Take 3 tablets (600 mg total) by mouth every 6 (six) hours as needed for mild pain. 30 tablet 0  . Levonorgestrel-Ethinyl Estradiol (AMETHIA) 0.15-0.03 &0.01 MG tablet Take 1 tablet by mouth daily. 1 Package 1  . losartan-hydrochlorothiazide (HYZAAR) 100-25 MG tablet Take 1 tablet by mouth daily. 90 tablet 3  . traMADol (ULTRAM) 50 MG tablet Take 1 tablet (50 mg total) by mouth every 6 (six) hours as needed. 60 tablet 1  . zolpidem (AMBIEN) 5 MG tablet Take 1 tablet (5 mg total) by mouth at bedtime as needed for sleep. 30 tablet 3   No current facility-administered medications on file prior to visit.     Allergies  Allergen Reactions  . Sulfamethoxazole-Trimethoprim Swelling and Rash    . Amoxil [Amoxicillin] Rash     Has tolerated augmentin!!  ? If had viral resh with amox.  . Azithromycin Rash  . Cefprozil Rash  . Ceftin [Cefuroxime Axetil] Rash  . Other Itching    CHG-wipes she had to use prier to surgery.  Ok to use CHG wash without problems CHG-wipes she had to use prier to surgery.  Ok to use CHG wash without problems  . Penicillins Rash    Has patient  had a PCN reaction causing immediate rash, facial/tongue/throat swelling, SOB or lightheadedness with hypotension: Yes Has patient had a PCN reaction causing severe rash involving mucus membranes or skin necrosis: No Has patient had a PCN reaction that required hospitalization No Has patient had a PCN reaction occurring within the last 10 years: No If all of the above answers are "NO", then may proceed with Cephalosporin use.   . Sulfa Antibiotics Rash      Review of Systems Constitutional: negative for chills, fatigue, fevers and sweats Eyes: negative for irritation, redness and visual disturbance Ears, nose, mouth, throat, and face: negative for hearing loss, nasal congestion, snoring and tinnitus Respiratory: negative for asthma, cough, sputum Cardiovascular: negative for chest pain, dyspnea, exertional chest pressure/discomfort, irregular heart beat, palpitations and syncope Gastrointestinal: negative for abdominal pain, change in bowel habits, nausea and vomiting Genitourinary: Positive for dyspareunia, right sided pelvic pain, and postcoital spotting. Negative for abnormal menstrual periods, genital lesions, vaginal discharge, dysuria and urinary incontinence Integument/breast: negative for breast lump, breast tenderness and nipple discharge Hematologic/lymphatic: negative for bleeding and easy bruising Musculoskeletal:negative for back pain and muscle weakness Neurological: negative for dizziness, headaches, vertigo and weakness Endocrine: negative for diabetic symptoms including polydipsia, polyuria  and skin dryness Allergic/Immunologic: negative for hay fever and urticaria       Objective:  Blood pressure 132/85, pulse 83, height 5\' 5"  (1.651 m), weight 219 lb (99.3 kg), last menstrual period 10/15/2013. Body mass index is 36.44 kg/m.   General Appearance:    Alert, cooperative, no distress, appears stated age  Head:    Normocephalic, without obvious abnormality, atraumatic  Eyes:    PERRL, conjunctiva/corneas clear, EOM's intact, both eyes  Ears:    Normal external ear canals, both ears  Nose:   Nares normal, septum midline, mucosa normal, no drainage or sinus tenderness  Throat:   Lips, mucosa, and tongue normal; teeth and gums normal  Neck:   Supple, symmetrical, trachea midline, no adenopathy; thyroid: no enlargement/tenderness/nodules; no carotid bruit or JVD  Back:     Symmetric, no curvature, ROM normal, no CVA tenderness  Lungs:     Clear to auscultation bilaterally, respirations unlabored  Chest Wall:    No tenderness or deformity   Heart:    Regular rate and rhythm, S1 and S2 normal, no murmur, rub or gallop  Breast Exam:    No tenderness, masses, or nipple abnormality  Abdomen:     Soft, non-tender, bowel sounds active all four quadrants, no masses, no organomegaly.    Genitalia:    Pelvic:external genitalia normal, vagina without lesions, or discharge,.  Tenderness of anterior vaginal wall on palpation, no nodules noted. Rectovaginal septum  normal. Cervix and uterus surgically absent. Left ovary surgically absent. Right adnexa non-tender and nonpalpable.   Rectal:    Normal external sphincter.  No hemorrhoids appreciated. Internal exam not done.   Extremities:   Extremities normal, atraumatic, no cyanosis or edema  Pulses:   2+ and symmetric all extremities  Skin:   Skin color, texture, turgor normal, no rashes or lesions  Lymph nodes:   Cervical, supraclavicular, and axillary nodes normal  Neurologic:   CNII-XII intact, normal strength, sensation and reflexes  throughout   .  Labs:  Lab Results  Component Value Date   WBC 9.6 05/26/2016   HGB 13.7 05/26/2016   HCT 39.0 05/26/2016   MCV 87.2 05/26/2016   PLT 352 05/26/2016    Lab Results  Component Value Date   CREATININE  0.78 08/20/2015   BUN 12 08/20/2015   NA 137 08/20/2015   K 3.4 (L) 02/10/2016   CL 103 08/20/2015   CO2 22 08/20/2015    Lab Results  Component Value Date   ALT 19 08/20/2015   AST 17 08/20/2015   ALKPHOS 42 08/20/2015   BILITOT 0.8 08/20/2015    Lab Results  Component Value Date   TSH 1.71 08/25/2015     Assessment:    Encounter for routine gynecologic exam.   Chronic pelvic pain Endometriosis Sciatica Dyspareunia Post-coital spotting  Plan:    - Blood tests: Labs up to date except patient notes she has not been screened for lipid disorders or diabetes in some time. Will order for patient to return fasting.  - Breast self exam technique reviewed and patient encouraged to perform self-exam monthly. - Contraception: status post hysterectomy. - Discussed healthy lifestyle modifications. - Pap smear up to date.   - Endometriosis -  patient has been on several different treatments including OCPs, Danazole, Lupron, Depo-Provera, with only modest relief. Patient now notes that she is ready to remove her remaining ovary as she is tired of dealing with the endometriosis and the pain. Discussion had on risks of surgery and on ramifications of removal of remaining ovary including surgical menopause and requiring hormonal replacement therapy. Patient notes understanding and would like to proceed. Would like to have surgery as soon as possible, considering April 2 during her children spring break. Will arrange. - Dyspareunia and postcoital spotting likely due to possible endometrial implants along the vaginal cuff. No visible implants seen on pelvic exam today, and no visible implants were noted on recent laparoscopy performed last month. However did explain to  patient that sometimes implants can be noted to be microscopic and not always visible to the naked eye. However if patient is considering removal of remaining ovary her symptoms will most likely resolve after this.  - Sciatica pain - discussed certain exercises with patient, can even consider referral to physical therapy for more longer term management. - Chronic pelvic pain - patient has been on Tramadol and percocet for over 1 year.  Will need to wean after current surgery.   A total of 25 minutes were spent face-to-face with the patient during this encounter and over half of that time involved counseling and coordination of care.  Hildred Laser, MD [facility]

## 2016-07-06 ENCOUNTER — Encounter: Payer: Self-pay | Admitting: Obstetrics and Gynecology

## 2016-07-06 NOTE — H&P (Signed)
GYNECOLOGY PRE-OPERATIVE HISTORY AND PHYSICAL   Subjective:    Patient is a 36 y.o. [id number] female scheduled for laparoscopic right oophorectomy. Indications for procedure are chronic pelvic pain, endometriosis (with dyspareunia and post-coital bleeding).   Pertinent Gynecological History: Menses: none.  Patient is s/p hysterectomy Last pap: normal Date: 2012  Discussed Blood/Blood Products: no   Menstrual History: Menarche age: [age] Patient's last menstrual period was 10/15/2013.     OB History  Gravida Para Term Preterm AB Living  2 2 1 1   2   SAB TAB Ectopic Multiple Live Births          2    # Outcome Date GA Lbr Len/2nd Weight Sex Delivery Anes PTL Lv  2 Preterm 12/02/12 5419w5d  6 lb 6.8 oz (2.915 kg) F CS-LTranv Spinal  LIV  1 Term 2008 6632w0d  6 lb 14 oz (3.118 kg) F CS-LTranv Spinal N LIV     Birth Comments:  pre-e on MAG, IOL. failure to progress      Past Medical History:  Diagnosis Date  . Abnormal Pap smear   . Anxiety   . Anxiety   . Cough    HAS COLD. PRODUCTIVE CLEAR. NO FEVER. NO WHEEZING. TO SEE PCP IF WORSENS  . Endometriosis   . HA (headache)    otc med prn  . Hyperlipidemia    diet controlled, no meds  . Hypertension   . PONV (postoperative nausea and vomiting)   . Pregnancy induced hypertension 2008    Past Surgical History:  Procedure Laterality Date  . ABLATION ON ENDOMETRIOSIS N/A 06/04/2014   Procedure: ABLATION ON ENDOMETRIOSIS;  Surgeon: Leslie AndreaJames E Tomblin II, MD;  Location: [facility];  Service: Gynecology;  Laterality: N/A;  . BIOPSY  12/01/2011   Procedure: BIOPSY;  Surgeon: Leslie AndreaJames E Tomblin II, MD;  Location: [facility];  Service: Gynecology;  Laterality: N/A;  . CESAREAN SECTION  2008  . CESAREAN SECTION WITH BILATERAL TUBAL LIGATION N/A 12/02/2012   Procedure: Repeat Cesarean Section Delivery Baby Girl @ 1826, Apgars 8/9, Bilateral Tubal Ligation;  Surgeon: Leslie AndreaJames E Tomblin II, MD;  Location: [facility];  Service: Obstetrics;  Laterality: N/A;    . COLPOSCOPY     as teenager, results WNL  . ENDOMETRIAL BIOPSY    . LAPAROSCOPIC ASSISTED VAGINAL HYSTERECTOMY  2012   with bilateral salpingectomy  . LAPAROSCOPIC BILATERAL SALPINGECTOMY Bilateral 02/15/2015   Procedure: LAPAROSCOPIC BILATERAL SALPINGECTOMY;  Surgeon: Hildred LaserAnika Deshaun Schou, MD;  Location: [facility];  Service: Gynecology;  Laterality: Bilateral;  . LAPAROSCOPIC LYSIS OF ADHESIONS N/A 06/04/2014   Procedure: LAPAROSCOPIC LYSIS OF ADHESIONS;  Surgeon: Leslie AndreaJames E Tomblin II, MD;  Location: [facility];  Service: Gynecology;  Laterality: N/A;  . LAPAROSCOPY  12/01/2011   Procedure: LAPAROSCOPY OPERATIVE;  Surgeon: Leslie AndreaJames E Tomblin II, MD;  Location: [facility];  Service: Gynecology;  Laterality: N/A;  . LAPAROSCOPY N/A 06/04/2014   Procedure: LAPAROSCOPY OPERATIVE;  Surgeon: Leslie AndreaJames E Tomblin II, MD;  Location: [facility];  Service: Gynecology;  Laterality: N/A;  . LAPAROSCOPY N/A 02/14/2016   Procedure: LAPAROSCOPY DIAGNOSTIC WITH BIOPSIES POSSIBLE ADHESIOLYSIS;  Surgeon: Hildred LaserAnika Deren Degrazia, MD;  Location: [facility];  Service: Gynecology;  Laterality: N/A;  . LAPAROSCOPY N/A 05/26/2016   Procedure: LAPAROSCOPY operative with lysis of adhesions;  Surgeon: Linzie Collinavid James Evans, MD;  Location: [facility];  Service: Gynecology;  Laterality: N/A;  . OOPHORECTOMY Left   . SALPINGECTOMY Bilateral   . TONSILLECTOMY    . TONSILLECTOMY    .  TUBAL LIGATION    . TYMPANOSTOMY TUBE PLACEMENT       Social History   Social History  . Marital status: Married    Spouse name: N/A  . Number of children: 1  . Years of education: N/A   Occupational History  . 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 Smoking status: Never Smoker  . Smokeless tobacco: Never Used  . Alcohol use Yes     Comment: occasional  . Drug use: No  . Sexual activity: Yes    Birth control/ protection: Surgical, Pill   Other Topics Concern  . None    Social History Narrative  . None    Family History  Problem Relation Age of Onset  . Hypertension Father   . Coronary artery disease Paternal Uncle   . Cancer Paternal Grandfather   . Hypertension Mother   . Breast cancer Paternal Aunt   . Ovarian cancer Maternal Grandmother   . Diabetes Neg Hx     Current Outpatient Prescriptions on File Prior to Visit  Medication Sig Dispense Refill  . amLODipine (NORVASC) 5 MG tablet Take 1 tablet (5 mg total) by mouth daily. 30 tablet 1  . gabapentin (NEURONTIN) 300 MG capsule Take 2 capsules (600 mg total) by mouth 3 (three) times daily. 90 capsule 1  . ibuprofen (ADVIL,MOTRIN) 200 MG tablet Take 3 tablets (600 mg total) by mouth every 6 (six) hours as needed for mild pain. 30 tablet 0  . Levonorgestrel-Ethinyl Estradiol (AMETHIA) 0.15-0.03 &0.01 MG tablet Take 1 tablet by mouth daily. 1 Package 1  . losartan-hydrochlorothiazide (HYZAAR) 100-25 MG tablet Take 1 tablet by mouth daily. 90 tablet 3  . traMADol (ULTRAM) 50 MG tablet Take 1 tablet (50 mg total) by mouth every 6 (six) hours as needed. 60 tablet 1  . zolpidem (AMBIEN) 5 MG tablet Take 1 tablet (5 mg total) by mouth at bedtime as needed for sleep. 30 tablet 3   No current facility-administered medications on file prior to visit.      Allergies  Allergen Reactions  . Sulfamethoxazole-Trimethoprim Swelling and Rash  . Amoxil [Amoxicillin] Rash     Has tolerated augmentin!!  ? If had viral resh with amox.  . Azithromycin Rash  . Cefprozil Rash  . Ceftin [Cefuroxime Axetil] Rash  . Other Itching    CHG-wipes she had to use prier to surgery.  Ok to use CHG wash without problems CHG-wipes she had to use prier to surgery.  Ok to use CHG wash without problems  . Penicillins Rash    Has patient had a PCN reaction causing immediate rash, facial/tongue/throat swelling, SOB or lightheadedness with hypotension: Yes Has patient had a PCN reaction causing severe rash involving mucus  membranes or skin necrosis: No Has patient had a PCN reaction that required hospitalization No Has patient had a PCN reaction occurring within the last 10 years: No If all of the above answers are "NO", then may proceed with Cephalosporin use.   . Sulfa Antibiotics Rash    Review of Systems Constitutional: No recent fever/chills/sweats Respiratory: No recent cough/bronchitis Cardiovascular: No chest pain Gastrointestinal: No recent nausea/vomiting/diarrhea Genitourinary: No UTI symptoms Hematologic/lymphatic:No history of coagulopathy or recent blood thinner use    Objective:    BP 132/85 (BP Location: Left Arm, Patient Position: Sitting, Cuff Size: Large)   Pulse 83  Ht 5\' 5"  (1.651 m)   Wt 219 lb (99.3 kg)   LMP 10/15/2013   BMI 36.44 kg/m   General:   Normal  Skin:   normal  HEENT:  Normal  Neck:  Supple without Adenopathy or Thyromegaly  Lungs:   Heart:              Breasts:   Abdomen:  Pelvis:  M/S   Extremeties:  Neuro:    clear to auscultation bilaterally   Normal without murmur   Not Examined   soft, non-tender; bowel sounds normal; no masses,  no organomegaly   Exam deferred to OR  No CVAT  Warm/Dry  Normal       Assessment:   Chronic pelvic pain Endometriosis Dyspareunia Post-coital spotting  Plan:    Counseling: Procedure, risks, reasons, benefits and complications (including injury to bowel, bladder, major blood vessel, ureter, bleeding, possibility of transfusion, infection, or fistula formation) reviewed in detail. Consent signed. Preop testing ordered. Instructions reviewed, including NPO after midnight.   Hildred Laser, MD [facility]

## 2016-07-07 ENCOUNTER — Encounter: Payer: Self-pay | Admitting: Family Medicine

## 2016-07-13 ENCOUNTER — Encounter
Admission: RE | Admit: 2016-07-13 | Discharge: 2016-07-13 | Disposition: A | Discharge status: Home / Self Care | Payer: BLUE CROSS/BLUE SHIELD | Source: Ambulatory Visit | Attending: Obstetrics and Gynecology | Admitting: Obstetrics and Gynecology

## 2016-07-13 DIAGNOSIS — Z01812 Encounter for preprocedural laboratory examination: Secondary | ICD-10-CM | POA: Diagnosis not present

## 2016-07-13 DIAGNOSIS — R102 Pelvic and perineal pain: Secondary | ICD-10-CM | POA: Diagnosis not present

## 2016-07-13 DIAGNOSIS — N941 Unspecified dyspareunia: Secondary | ICD-10-CM | POA: Insufficient documentation

## 2016-07-13 DIAGNOSIS — N930 Postcoital and contact bleeding: Secondary | ICD-10-CM | POA: Diagnosis not present

## 2016-07-13 DIAGNOSIS — G8929 Other chronic pain: Secondary | ICD-10-CM | POA: Diagnosis not present

## 2016-07-13 DIAGNOSIS — N809 Endometriosis, unspecified: Secondary | ICD-10-CM | POA: Insufficient documentation

## 2016-07-13 LAB — SURGICAL PCR SCREEN
MRSA, PCR: NEGATIVE
Staphylococcus aureus: NEGATIVE

## 2016-07-13 LAB — CBC
HCT: 35 % (ref 35.0–47.0)
Hemoglobin: 12.6 g/dL (ref 12.0–16.0)
MCH: 31.5 pg (ref 26.0–34.0)
MCHC: 35.9 g/dL (ref 32.0–36.0)
MCV: 87.8 fL (ref 80.0–100.0)
Platelets: 318 10*3/uL (ref 150–440)
RBC: 3.99 MIL/uL (ref 3.80–5.20)
RDW: 13.5 % (ref 11.5–14.5)
WBC: 6.6 10*3/uL (ref 3.6–11.0)

## 2016-07-13 LAB — POTASSIUM: Potassium: 4 mmol/L (ref 3.5–5.1)

## 2016-07-13 NOTE — Patient Instructions (Signed)
  Your procedure is scheduled on: July 17, 2016 (Monday) Report to Same Day Surgery 2nd floor medical mall [facility](Medical Mall Entrance-take elevator on left to 2nd floor.  Check in with surgery information desk.) To find out your arrival time please call [phone] between 1PM - 3PM on July 14, 2016 (Friday)  Remember: Instructions that are not followed completely may result in serious medical risk, up to and including death, or upon the discretion of your surgeon and anesthesiologist your surgery may need to be rescheduled.    _x___ 1. Do not eat food or drink liquids after midnight. No gum chewing or hard candies                                __x__ 2. No Alcohol for 24 hours before or after surgery.   __x__3. No Smoking for 24 prior to surgery.   ____  4. Bring all medications with you on the day of surgery if instructed.    __x__ 5. Notify your doctor if there is any change in your medical condition     (cold, fever, infections).     Do not wear jewelry, make-up, hairpins, clips or nail polish.  Do not wear lotions, powders, or perfumes. You may wear deodorant.  Do not shave 48 hours prior to surgery. Men may shave face and neck.  Do not bring valuables to the hospital.    [facility] not responsible for any belongings or valuables.               Contacts, dentures or bridgework may not be worn into surgery.  Leave your suitcase in the car. After surgery it may be brought to your room.  For patients admitted to the hospital, discharge time is determined by your treatment team                        Patients discharged the day of surgery will not be allowed to drive home.  You will need someone to drive you home and stay with you the night of your procedure.    Please read over the following fact sheets that you were given:   [facility] Preparing for [facility] and or MRSA Information   _x___ Take anti-hypertensive (unless it includes a diuretic), cardiac, seizure, asthma,      anti-reflux and psychiatric medicines with a sip of water. These include:  1. AMLODIPINE  2.  3.  4.  5.  6.  ____Fleets enema or Magnesium Citrate as directed.   _x___ Use CHG Soap or sage wipes as directed on instruction sheet   ____ Use inhalers on the day of surgery and bring to hospital day of surgery  ____ Stop Metformin and Janumet 2 days prior to surgery.    ____ Take 1/2 of usual insulin dose the night before surgery and none on the morning surgery     _x___ Follow recommendations from Cardiologist, Pulmonologist or PCP regarding          stopping Aspirin, Coumadin, Pllavix ,Eliquis, Effient, or Pradaxa, and Pletal.  X____Stop Anti-inflammatories such as Advil, Aleve, Ibuprofen, Motrin, Naproxen, Naprosyn, Goodies powders or aspirin products. OK to take Tylenol   _x___ Stop supplements until after surgery.  But may continue Vitamin D, Vitamin B,  and multivitamin        ____ Bring C-Pap to the hospital.

## 2016-07-16 MED ORDER — FAMOTIDINE 20 MG PO TABS
20.0000 mg | ORAL_TABLET | Freq: Once | ORAL | Status: AC
  Administered 2016-07-17: 20 mg via ORAL

## 2016-07-16 MED ORDER — SCOPOLAMINE 1 MG/3DAYS TD PT72
1.0000 | MEDICATED_PATCH | Freq: Once | TRANSDERMAL | Status: DC
  Administered 2016-07-17: 1.5 mg via TRANSDERMAL

## 2016-07-17 ENCOUNTER — Ambulatory Visit: Payer: BLUE CROSS/BLUE SHIELD | Admitting: Anesthesiology

## 2016-07-17 ENCOUNTER — Ambulatory Visit
Admission: RE | Admit: 2016-07-17 | Discharge: 2016-07-17 | Disposition: A | Discharge status: Home / Self Care | Payer: BLUE CROSS/BLUE SHIELD | Source: Ambulatory Visit | Attending: Obstetrics and Gynecology | Admitting: Obstetrics and Gynecology

## 2016-07-17 ENCOUNTER — Encounter
Admission: RE | Disposition: A | Discharge status: Home / Self Care | Payer: Self-pay | Source: Ambulatory Visit | Attending: Obstetrics and Gynecology

## 2016-07-17 ENCOUNTER — Encounter: Payer: Self-pay | Admitting: Anesthesiology

## 2016-07-17 DIAGNOSIS — E669 Obesity, unspecified: Secondary | ICD-10-CM | POA: Diagnosis not present

## 2016-07-17 DIAGNOSIS — Z833 Family history of diabetes mellitus: Secondary | ICD-10-CM | POA: Insufficient documentation

## 2016-07-17 DIAGNOSIS — I10 Essential (primary) hypertension: Secondary | ICD-10-CM | POA: Diagnosis not present

## 2016-07-17 DIAGNOSIS — N8311 Corpus luteum cyst of right ovary: Secondary | ICD-10-CM | POA: Diagnosis not present

## 2016-07-17 DIAGNOSIS — Z880 Allergy status to penicillin: Secondary | ICD-10-CM | POA: Diagnosis not present

## 2016-07-17 DIAGNOSIS — Z803 Family history of malignant neoplasm of breast: Secondary | ICD-10-CM | POA: Insufficient documentation

## 2016-07-17 DIAGNOSIS — Z881 Allergy status to other antibiotic agents status: Secondary | ICD-10-CM | POA: Insufficient documentation

## 2016-07-17 DIAGNOSIS — N941 Unspecified dyspareunia: Secondary | ICD-10-CM

## 2016-07-17 DIAGNOSIS — R05 Cough: Secondary | ICD-10-CM | POA: Diagnosis not present

## 2016-07-17 DIAGNOSIS — E785 Hyperlipidemia, unspecified: Secondary | ICD-10-CM | POA: Diagnosis not present

## 2016-07-17 DIAGNOSIS — Z90710 Acquired absence of both cervix and uterus: Secondary | ICD-10-CM | POA: Insufficient documentation

## 2016-07-17 DIAGNOSIS — F419 Anxiety disorder, unspecified: Secondary | ICD-10-CM | POA: Diagnosis not present

## 2016-07-17 DIAGNOSIS — Z888 Allergy status to other drugs, medicaments and biological substances status: Secondary | ICD-10-CM | POA: Insufficient documentation

## 2016-07-17 DIAGNOSIS — Z8249 Family history of ischemic heart disease and other diseases of the circulatory system: Secondary | ICD-10-CM | POA: Insufficient documentation

## 2016-07-17 DIAGNOSIS — G8929 Other chronic pain: Secondary | ICD-10-CM | POA: Diagnosis present

## 2016-07-17 DIAGNOSIS — R102 Pelvic and perineal pain: Secondary | ICD-10-CM | POA: Insufficient documentation

## 2016-07-17 DIAGNOSIS — R51 Headache: Secondary | ICD-10-CM | POA: Diagnosis not present

## 2016-07-17 DIAGNOSIS — Z8041 Family history of malignant neoplasm of ovary: Secondary | ICD-10-CM | POA: Diagnosis not present

## 2016-07-17 DIAGNOSIS — Z882 Allergy status to sulfonamides status: Secondary | ICD-10-CM | POA: Insufficient documentation

## 2016-07-17 DIAGNOSIS — N809 Endometriosis, unspecified: Secondary | ICD-10-CM

## 2016-07-17 DIAGNOSIS — Z79899 Other long term (current) drug therapy: Secondary | ICD-10-CM | POA: Diagnosis not present

## 2016-07-17 DIAGNOSIS — M1990 Unspecified osteoarthritis, unspecified site: Secondary | ICD-10-CM | POA: Insufficient documentation

## 2016-07-17 HISTORY — PX: LAPAROSCOPIC SALPINGO OOPHERECTOMY: SHX5927

## 2016-07-17 LAB — TYPE AND SCREEN
ABO/RH(D): O POS
Antibody Screen: NEGATIVE

## 2016-07-17 SURGERY — SALPINGO-OOPHORECTOMY, LAPAROSCOPIC
Anesthesia: General | Site: Abdomen | Laterality: Right | Wound class: Clean Contaminated

## 2016-07-17 MED ORDER — PROPOFOL 10 MG/ML IV BOLUS
INTRAVENOUS | Status: AC
  Filled 2016-07-17: qty 20

## 2016-07-17 MED ORDER — BUPIVACAINE HCL (PF) 0.5 % IJ SOLN
INTRAMUSCULAR | Status: AC
  Filled 2016-07-17: qty 30

## 2016-07-17 MED ORDER — FAMOTIDINE 20 MG PO TABS
ORAL_TABLET | ORAL | Status: AC
  Administered 2016-07-17: 20 mg via ORAL
  Filled 2016-07-17: qty 1

## 2016-07-17 MED ORDER — LIDOCAINE HCL (CARDIAC) 20 MG/ML IV SOLN
INTRAVENOUS | Status: DC | PRN
  Administered 2016-07-17: 80 mg via INTRAVENOUS

## 2016-07-17 MED ORDER — FENTANYL CITRATE (PF) 100 MCG/2ML IJ SOLN
INTRAMUSCULAR | Status: AC
  Filled 2016-07-17: qty 2

## 2016-07-17 MED ORDER — ACETAMINOPHEN NICU IV SYRINGE 10 MG/ML
INTRAVENOUS | Status: AC
  Filled 2016-07-17: qty 1

## 2016-07-17 MED ORDER — LACTATED RINGERS IV SOLN
INTRAVENOUS | Status: DC
  Administered 2016-07-17: 10:00:00 via INTRAVENOUS

## 2016-07-17 MED ORDER — DEXAMETHASONE SODIUM PHOSPHATE 10 MG/ML IJ SOLN
INTRAMUSCULAR | Status: DC | PRN
  Administered 2016-07-17: 10 mg via INTRAVENOUS

## 2016-07-17 MED ORDER — SUGAMMADEX SODIUM 200 MG/2ML IV SOLN
INTRAVENOUS | Status: DC | PRN
  Administered 2016-07-17: 200 mg via INTRAVENOUS

## 2016-07-17 MED ORDER — SUGAMMADEX SODIUM 200 MG/2ML IV SOLN
INTRAVENOUS | Status: AC
  Filled 2016-07-17: qty 2

## 2016-07-17 MED ORDER — IBUPROFEN 800 MG PO TABS: 800.0000 mg | ORAL_TABLET | Freq: Three times a day (TID) | ORAL | 1 refills | Status: DC | PRN

## 2016-07-17 MED ORDER — MIDAZOLAM HCL 2 MG/2ML IJ SOLN
INTRAMUSCULAR | Status: AC
  Filled 2016-07-17: qty 2

## 2016-07-17 MED ORDER — LIDOCAINE HCL (PF) 2 % IJ SOLN
INTRAMUSCULAR | Status: AC
  Filled 2016-07-17: qty 2

## 2016-07-17 MED ORDER — ACETAMINOPHEN 10 MG/ML IV SOLN
INTRAVENOUS | Status: DC | PRN
  Administered 2016-07-17: 1000 mg via INTRAVENOUS

## 2016-07-17 MED ORDER — PROPOFOL 10 MG/ML IV BOLUS
INTRAVENOUS | Status: DC | PRN
  Administered 2016-07-17: 170 mg via INTRAVENOUS

## 2016-07-17 MED ORDER — ONDANSETRON HCL 4 MG/2ML IJ SOLN
INTRAMUSCULAR | Status: DC | PRN
  Administered 2016-07-17: 4 mg via INTRAVENOUS

## 2016-07-17 MED ORDER — FENTANYL CITRATE (PF) 100 MCG/2ML IJ SOLN
INTRAMUSCULAR | Status: DC | PRN
  Administered 2016-07-17: 100 ug via INTRAVENOUS
  Administered 2016-07-17 (×2): 50 ug via INTRAVENOUS

## 2016-07-17 MED ORDER — ROCURONIUM BROMIDE 50 MG/5ML IV SOLN
INTRAVENOUS | Status: AC
  Filled 2016-07-17: qty 1

## 2016-07-17 MED ORDER — DOCUSATE SODIUM 100 MG PO CAPS: 100.0000 mg | ORAL_CAPSULE | Freq: Two times a day (BID) | ORAL | 2 refills | Status: DC | PRN

## 2016-07-17 MED ORDER — ROCURONIUM BROMIDE 100 MG/10ML IV SOLN
INTRAVENOUS | Status: DC | PRN
  Administered 2016-07-17: 10 mg via INTRAVENOUS
  Administered 2016-07-17: 40 mg via INTRAVENOUS
  Administered 2016-07-17: 10 mg via INTRAVENOUS

## 2016-07-17 MED ORDER — BUPIVACAINE HCL 0.5 % IJ SOLN
INTRAMUSCULAR | Status: DC | PRN
  Administered 2016-07-17: 20 mL

## 2016-07-17 MED ORDER — ESTRADIOL 0.05 MG/24HR TD PTWK
0.0500 mg | MEDICATED_PATCH | TRANSDERMAL | Status: DC
  Administered 2016-07-17: 0.05 mg via TRANSDERMAL
  Filled 2016-07-17: qty 1

## 2016-07-17 MED ORDER — SCOPOLAMINE 1 MG/3DAYS TD PT72
MEDICATED_PATCH | TRANSDERMAL | Status: AC
  Administered 2016-07-17: 1.5 mg via TRANSDERMAL
  Filled 2016-07-17: qty 1

## 2016-07-17 MED ORDER — MIDAZOLAM HCL 2 MG/2ML IJ SOLN
INTRAMUSCULAR | Status: DC | PRN
  Administered 2016-07-17: 2 mg via INTRAVENOUS

## 2016-07-17 MED ORDER — ROCURONIUM BROMIDE 50 MG/5ML IV SOLN
INTRAVENOUS | Status: AC
Start: 2016-07-17 — End: 2016-07-17
  Filled 2016-07-17: qty 1

## 2016-07-17 MED ORDER — OXYCODONE-ACETAMINOPHEN 5-325 MG PO TABS
1.0000 | ORAL_TABLET | Freq: Four times a day (QID) | ORAL | Status: DC | PRN
  Administered 2016-07-17 (×2): 1 via ORAL

## 2016-07-17 MED ORDER — ONDANSETRON HCL 4 MG/2ML IJ SOLN
INTRAMUSCULAR | Status: AC
  Filled 2016-07-17: qty 2

## 2016-07-17 MED ORDER — FENTANYL CITRATE (PF) 100 MCG/2ML IJ SOLN
INTRAMUSCULAR | Status: AC
  Administered 2016-07-17: 25 ug via INTRAVENOUS
  Filled 2016-07-17: qty 2

## 2016-07-17 MED ORDER — ONDANSETRON HCL 4 MG/2ML IJ SOLN: 4.0000 mg | Freq: Once | INTRAMUSCULAR | Status: DC | PRN

## 2016-07-17 MED ORDER — OXYCODONE-ACETAMINOPHEN 5-325 MG PO TABS
ORAL_TABLET | ORAL | Status: AC
  Administered 2016-07-17: 1 via ORAL
  Filled 2016-07-17: qty 1

## 2016-07-17 MED ORDER — OXYCODONE-ACETAMINOPHEN 5-325 MG PO TABS
ORAL_TABLET | ORAL | Status: AC
  Filled 2016-07-17: qty 1

## 2016-07-17 MED ORDER — SIMETHICONE 80 MG PO CHEW: 80.0000 mg | CHEWABLE_TABLET | Freq: Four times a day (QID) | ORAL | 0 refills | Status: DC | PRN

## 2016-07-17 MED ORDER — FENTANYL CITRATE (PF) 100 MCG/2ML IJ SOLN
25.0000 ug | INTRAMUSCULAR | Status: AC | PRN
  Administered 2016-07-17 (×6): 25 ug via INTRAVENOUS

## 2016-07-17 MED ORDER — OXYCODONE-ACETAMINOPHEN 5-325 MG PO TABS: 1.0000 | ORAL_TABLET | Freq: Four times a day (QID) | ORAL | 0 refills | Status: DC | PRN

## 2016-07-17 MED ORDER — DEXAMETHASONE SODIUM PHOSPHATE 10 MG/ML IJ SOLN
INTRAMUSCULAR | Status: AC
  Filled 2016-07-17: qty 1

## 2016-07-17 SURGICAL SUPPLY — 36 items
ANCHOR TIS RET SYS 235ML (MISCELLANEOUS) ×2 IMPLANT
BLADE SURG SZ11 CARB STEEL (BLADE) ×2 IMPLANT
CANISTER SUCT 1200ML W/VALVE (MISCELLANEOUS) ×2 IMPLANT
CATH ROBINSON RED A/P 16FR (CATHETERS) ×2 IMPLANT
CHLORAPREP W/TINT 26ML (MISCELLANEOUS) ×2 IMPLANT
CORD MONOPOLAR M/FML 12FT (MISCELLANEOUS) IMPLANT
DRSG TELFA 3X8 NADH (GAUZE/BANDAGES/DRESSINGS) ×2 IMPLANT
GLOVE BIO SURGEON STRL SZ 6.5 (GLOVE) ×2 IMPLANT
GLOVE BIO SURGEON STRL SZ8 (GLOVE) ×2 IMPLANT
GLOVE INDICATOR 7.0 STRL GRN (GLOVE) ×2 IMPLANT
GLOVE INDICATOR 8.0 STRL GRN (GLOVE) ×2 IMPLANT
GOWN STRL REUS W/ TWL LRG LVL3 (GOWN DISPOSABLE) ×2 IMPLANT
GOWN STRL REUS W/TWL LRG LVL3 (GOWN DISPOSABLE) ×2
GOWN STRL REUS W/TWL XL LVL3 (GOWN DISPOSABLE) ×2 IMPLANT
IRRIGATION STRYKERFLOW (MISCELLANEOUS) ×1 IMPLANT
IRRIGATOR STRYKERFLOW (MISCELLANEOUS) ×2
IV LACTATED RINGERS 1000ML (IV SOLUTION) ×2 IMPLANT
KIT PINK PAD W/HEAD ARE REST (MISCELLANEOUS) ×2
KIT PINK PAD W/HEAD ARM REST (MISCELLANEOUS) ×1 IMPLANT
KIT RM TURNOVER CYSTO AR (KITS) ×2 IMPLANT
LIQUID BAND (GAUZE/BANDAGES/DRESSINGS) ×2 IMPLANT
NS IRRIG 500ML POUR BTL (IV SOLUTION) ×2 IMPLANT
PACK GYN LAPAROSCOPIC (MISCELLANEOUS) ×2 IMPLANT
PAD OB MATERNITY 4.3X12.25 (PERSONAL CARE ITEMS) ×2 IMPLANT
PAD PREP 24X41 OB/GYN DISP (PERSONAL CARE ITEMS) ×2 IMPLANT
SCISSORS METZENBAUM CVD 33 (INSTRUMENTS) ×2 IMPLANT
SHEARS HARMONIC ACE PLUS 36CM (ENDOMECHANICALS) ×2 IMPLANT
SLEEVE ENDOPATH XCEL 5M (ENDOMECHANICALS) ×2 IMPLANT
SUT VIC AB 3-0 SH 27 (SUTURE)
SUT VIC AB 3-0 SH 27X BRD (SUTURE) IMPLANT
SUT VICRYL 0 AB UR-6 (SUTURE) ×2 IMPLANT
SYR 50ML LL SCALE MARK (SYRINGE) ×2 IMPLANT
TROCAR ENDO BLADELESS 11MM (ENDOMECHANICALS) ×2 IMPLANT
TROCAR XCEL NON-BLD 5MMX100MML (ENDOMECHANICALS) ×2 IMPLANT
TROCAR XCEL UNIV SLVE 11M 100M (ENDOMECHANICALS) ×2 IMPLANT
TUBING INSUFFLATOR HI FLOW (MISCELLANEOUS) ×2 IMPLANT

## 2016-07-17 NOTE — Anesthesia Procedure Notes (Signed)
Procedure Name: Intubation Date/Time: 07/17/2016 10:06 AM Performed by: Hedda Slade Pre-anesthesia Checklist: Patient identified, Patient being monitored, Timeout performed, Emergency Drugs available and Suction available Patient Re-evaluated:Patient Re-evaluated prior to inductionOxygen Delivery Method: Circle system utilized Preoxygenation: Pre-oxygenation with 100% oxygen Intubation Type: IV induction Ventilation: Mask ventilation without difficulty Laryngoscope Size: Mac and 3 Grade View: Grade I Tube type: Oral Tube size: 7.0 mm Number of attempts: 1 Airway Equipment and Method: Stylet Placement Confirmation: ETT inserted through vocal cords under direct vision,  positive ETCO2 and breath sounds checked- equal and bilateral Secured at: 21 cm Tube secured with: Tape Dental Injury: Teeth and Oropharynx as per pre-operative assessment

## 2016-07-17 NOTE — Anesthesia Post-op Follow-up Note (Cosign Needed)
Anesthesia QCDR form completed.        

## 2016-07-17 NOTE — Anesthesia Postprocedure Evaluation (Signed)
Anesthesia Post Note  Patient: [name]  Procedure(s) Performed: Procedure(s) (LRB): LAPAROSCOPIC RIGHT SALPINGO OOPHORECTOMY (Right)  Patient location during evaluation: PACU Anesthesia Type: General Level of consciousness: awake and alert Pain management: pain level controlled Vital Signs Assessment: post-procedure vital signs reviewed and stable Respiratory status: spontaneous breathing, nonlabored ventilation, respiratory function stable and patient connected to nasal cannula oxygen Cardiovascular status: blood pressure returned to baseline and stable Postop Assessment: no signs of nausea or vomiting Anesthetic complications: no     Last Vitals:  Vitals:   07/17/16 1242 07/17/16 1300  BP: (!) 148/93 (!) 141/96  Pulse: 84   Resp: 20   Temp: 37.1 C     Last Pain:  Vitals:   07/17/16 1242  PainSc: 8                  Lakea Mittelman S

## 2016-07-17 NOTE — Discharge Instructions (Signed)

## 2016-07-17 NOTE — Transfer of Care (Signed)
Immediate Anesthesia Transfer of Care Note  Patient: [name]  Procedure(s) Performed: Procedure(s): LAPAROSCOPIC RIGHT SALPINGO OOPHORECTOMY (Right)  Patient Location: PACU  Anesthesia Type:General  Level of Consciousness: awake, alert  and oriented  Airway & Oxygen Therapy: Patient Spontanous Breathing and Patient connected to face mask oxygen  Post-op Assessment: Report given to RN and Post -op Vital signs reviewed and stable  Post vital signs: Reviewed and stable  Last Vitals:  Vitals:   07/17/16 1146  BP: 130/72  Pulse: 90  Resp: (!) 25  Temp: 36.9 C    Last Pain: There were no vitals filed for this visit.       Complications: No apparent anesthesia complications

## 2016-07-17 NOTE — H&P (Signed)
UPDATE TO PREVIOUS HISTORY AND PHYSICAL  The patient has been seen and examined.  H&P is up to date, no changes noted.  [name] is a 36 y.o. G46P1102 female scheduled for laparoscopic right oophorectomy. Indications for procedure are chronic pelvic pain, endometriosis (with dyspareunia and post-coital bleeding), and h/o multiple abdominopelvic surgeries.  All questions answered. Patient can proceed to the OR for scheduled procedure.   Hildred Laser, MD 07/17/2016 9:36 AM

## 2016-07-17 NOTE — Anesthesia Preprocedure Evaluation (Signed)
Anesthesia Evaluation  Patient identified by MRN, date of birth, ID band Patient awake    Reviewed: Allergy & Precautions, NPO status , Patient's Chart, lab work & pertinent test results, reviewed documented beta blocker date and time   History of Anesthesia Complications (+) PONV and history of anesthetic complications  Airway Mallampati: III  TM Distance: >3 FB     Dental  (+) Chipped   Pulmonary           Cardiovascular hypertension, Pt. on medications and Pt. on home beta blockers      Neuro/Psych  Headaches, Anxiety  Neuromuscular disease    GI/Hepatic   Endo/Other    Renal/GU      Musculoskeletal  (+) Arthritis ,   Abdominal   Peds  Hematology   Anesthesia Other Findings Obese.  Reproductive/Obstetrics                             Anesthesia Physical Anesthesia Plan  ASA: II  Anesthesia Plan: General   Post-op Pain Management:    Induction: Intravenous  Airway Management Planned: Oral ETT  Additional Equipment:   Intra-op Plan:   Post-operative Plan:   Informed Consent: I have reviewed the patients History and Physical, chart, labs and discussed the procedure including the risks, benefits and alternatives for the proposed anesthesia with the patient or authorized representative who has indicated his/her understanding and acceptance.     Plan Discussed with: CRNA  Anesthesia Plan Comments:         Anesthesia Quick Evaluation

## 2016-07-17 NOTE — Op Note (Addendum)
Procedure(s): LAPAROSCOPIC RIGHT SALPINGO OOPHORECTOMY,EXCISION AND FULGURATION OF ENDOMETRIOSIS, LYSIS OF ADHESIONS Procedure Note  LEXINGTON DEVINE female 36 y.o. 07/17/2016  Indications: The patient is a 36 y.o. G44P1102 female with history of endometriosis, chronic pelvic pain, dyspareunia, and multiple abdomino-pelvic surgeries.  Pre-operative Diagnosis: History of endometriosis, chronic pelvic pain, dyspareunia, multiple abdomino-pelvic surgeries.   Post-operative Diagnosis: Same  Surgeon: Hildred Laser, MD  Assistants: Dr. Daphine Deutscher DeFrancesco  Anesthesia: General endotracheal anesthesia  Findings:  No visible lesions noted of vaginal cuff on speculum exam. No nodules palpable.  Several dark red-black powder burn lesions noted along vaginal cuff.  Filmy adhesions of large bowel to the vaginal cuff.  Cystic lesion (with appearance of endometrioma), ~ 1-2 cm on left vaginal cuff angle with adhesions to rectum. Re-brown lesion on portion of small bowel.  Normal appearing right ovary.  Fibrosis and few powder burn lesions along left pelvic sidewall near remnant of left infundibulopelvic ligament.  Left ovary, uterus, and bilateral fallopian tubes previously surgically removed.   Normal appearing upper abdomen.    Procedure Details: The patient was seen in the Holding Room. The risks, benefits, complications, treatment options, and expected outcomes were discussed with the patient.  The patient concurred with the proposed plan, giving informed consent.  The site of surgery properly noted/marked. The patient was taken to the Operating Room, identified as [name] and the procedure verified as Procedure(s) (LRB): LAPAROSCOPIC RIGHT SALPINGO OOPHORECTOMY (Right) WITH EXCISION AND FULGURATION OF ENDOMETRIOSIS. A Time Out was held and the above information confirmed.  She was then placed under general anesthesia without difficulty. She was placed in the dorsal lithotomy position,  and was prepped and draped in a sterile manner.  A straight catheterization was performed. A sterile speculum was inserted into the vagina and the cervix was grasped at the anterior lip using a single-toothed tenaculum.  A moist sponge stick was placed in the vagina for cuff manipulation.  The speculum and tenaculum were then removed. After an adequate timeout was performed, attention was turned to the abdomen where an umbilical incision was made with the scalpel.  The Optiview 5-mm trocar and sleeve were then advanced without difficulty with the laparoscope under direct visualization into the abdomen.  The abdomen was then insufflated with carbon dioxide gas and adequate pneumoperitoneum was obtained. A 5-mm left lower quadrant port and an 11-mm right lower quadrant port were then placed under direct visualization.  A survey of the patient's pelvis and abdomen revealed the findings as above.  On the right side, the infundibulopelvic ligament was clamped and transected with the Harmonic device.  An Endocatch bag was then inserted into the 11 mm trochar and the right ovary was removed.    Attention was then turned to the sites of visible endometriosis.  Biopsy forceps were used to obtain biopsies of the vaginal cuff, and the left pelvic sidewall.   The lesion on the small bowel was removed using laparoscopic endoshears, with care to remove only the most superficial layer (serosa).  Good hemostasis was noted, no injury occurred.  Next, the adhesions of the large bowel to the vaginal cuff were transected using the Harmonic device. The vaginal cuff cyst was also excised using the Harmonic device.  The Kleppinger monopolar device was used for cautery to obtain hemostasis as well as fulgurate all endometriosis excision sites. The fascia of the 11-mm incision was then closed using 0-Vicryl in a running fashion using the Carter-Thomason. Good hemostasis was continued to be observed.  All trocars were removed under direct  visualization, and the abdomen which was desufflated.  All incisions were approximated using 4-0 Monocryl and covered with Dermabond.  A total of 10 cc of 0.5% Sensorcaine was injected in all 3 incisions.   The patient tolerated the procedures well.  All instruments, needles, and sponge counts were correct x 2. The patient was taken to the recovery room awake, extubated and in stable condition.   Estimated Blood Loss:  minimal      Drains: straight catheterization prior to procedure with  10 ml of clear urine         Total IV Fluids:  500 ml  Specimens: Vaginal cuff biopsies, vaginal cuff mass, left pelvic sidewall biopsy, and right ovary.          Implants: None         Complications:  None; patient tolerated the procedure well.         Disposition: PACU - hemodynamically stable.         Condition: stable   Hildred Laser, MD [facility]

## 2016-07-18 ENCOUNTER — Encounter: Payer: Self-pay | Admitting: Obstetrics and Gynecology

## 2016-07-19 ENCOUNTER — Encounter: Payer: Self-pay | Admitting: Obstetrics and Gynecology

## 2016-07-19 ENCOUNTER — Other Ambulatory Visit: Payer: Self-pay | Admitting: Family Medicine

## 2016-07-19 MED ORDER — ESCITALOPRAM OXALATE 10 MG PO TABS: 10.0000 mg | ORAL_TABLET | Freq: Every day | ORAL | 5 refills | Status: DC

## 2016-07-21 ENCOUNTER — Other Ambulatory Visit: Payer: Self-pay | Admitting: Obstetrics and Gynecology

## 2016-07-21 ENCOUNTER — Telehealth: Payer: Self-pay | Admitting: Obstetrics and Gynecology

## 2016-07-21 MED ORDER — OXYCODONE-ACETAMINOPHEN 5-325 MG PO TABS: 1.0000 | ORAL_TABLET | Freq: Four times a day (QID) | ORAL | 0 refills | Status: DC | PRN

## 2016-07-21 NOTE — Telephone Encounter (Signed)
Patient calling with increased pain this am, she states she can hardly walk and needs to talk with you ASAP  Please call

## 2016-07-27 ENCOUNTER — Encounter: Payer: BLUE CROSS/BLUE SHIELD | Admitting: Obstetrics and Gynecology

## 2016-07-28 ENCOUNTER — Encounter: Payer: Self-pay | Admitting: Obstetrics and Gynecology

## 2016-07-28 LAB — SURGICAL PATHOLOGY

## 2016-07-31 ENCOUNTER — Encounter: Payer: Self-pay | Admitting: Obstetrics and Gynecology

## 2016-07-31 ENCOUNTER — Ambulatory Visit (INDEPENDENT_AMBULATORY_CARE_PROVIDER_SITE_OTHER): Payer: BLUE CROSS/BLUE SHIELD | Admitting: Obstetrics and Gynecology

## 2016-07-31 VITALS — BP 124/79 | HR 83 | Ht 65.0 in | Wt 214.4 lb

## 2016-07-31 DIAGNOSIS — Z9889 Other specified postprocedural states: Secondary | ICD-10-CM

## 2016-07-31 DIAGNOSIS — Z30011 Encounter for initial prescription of contraceptive pills: Secondary | ICD-10-CM

## 2016-07-31 DIAGNOSIS — N809 Endometriosis, unspecified: Secondary | ICD-10-CM

## 2016-07-31 DIAGNOSIS — E8940 Asymptomatic postprocedural ovarian failure: Secondary | ICD-10-CM

## 2016-07-31 DIAGNOSIS — R102 Pelvic and perineal pain: Secondary | ICD-10-CM

## 2016-07-31 NOTE — Progress Notes (Addendum)
HPI:      [name] is a 36 y.o. [id number] who LMP was Patient's last menstrual period was 10/15/2013.  Subjective:   She presents today 2 weeks from laparoscopic lysis of adhesions and treatment of endometriosis. She also underwent oophorectomy. She reports she is doing well at this time. She has just run out of her estrogen patch and does not want to have hot flashes. She reports that the pain has steadily improved since her surgery.    Hx: The following portions of the patient's history were reviewed and updated as appropriate:              She  has a past medical history of Abnormal Pap smear; Anxiety; Anxiety; Cough; Endometriosis; HA (headache); Hyperlipidemia; Hypertension; PONV (postoperative nausea and vomiting); and Pregnancy induced hypertension (2008). She  does not have any pertinent problems on file. She  has a past surgical history that includes Tonsillectomy; Tympanostomy tube placement; Cesarean section (2008); laparoscopy (12/01/2011); biopsy (12/01/2011); Endometrial biopsy; Colposcopy; Cesarean section with bilateral tubal ligation (N/A, 12/02/2012); Tubal ligation; Tonsillectomy; laparoscopy (N/A, 06/04/2014); Laparoscopic lysis of adhesions (N/A, 06/04/2014); Ablation on endometriosis (N/A, 06/04/2014); Laparoscopic bilateral salpingectomy (Bilateral, 02/15/2015); Oophorectomy (Left); Salpingectomy (Bilateral); laparoscopy (N/A, 02/14/2016); laparoscopy (N/A, 05/26/2016); Laparoscopic assisted vaginal hysterectomy (2012); Abdominal hysterectomy; and Laparoscopic salpingo oophorectomy (Right, 07/17/2016). Her family history includes Breast cancer in her paternal aunt; Cancer in her paternal grandfather; Coronary artery disease in her paternal uncle; Hypertension in her father and mother; Ovarian cancer in her maternal grandmother. She  reports that she has never smoked. She has never used smokeless tobacco. She reports that she drinks alcohol. She reports that she does not use  drugs. Current Outpatient Prescriptions on File Prior to Visit  Medication Sig Dispense Refill  . amLODipine (NORVASC) 5 MG tablet Take 1 tablet (5 mg total) by mouth daily. 30 tablet 1  . bisoprolol-hydrochlorothiazide (ZIAC) 10-6.25 MG tablet Take 1 tablet by mouth daily.    Marland Kitchen escitalopram (LEXAPRO) 10 MG tablet Take 1 tablet (10 mg total) by mouth daily. 30 tablet 5  . ibuprofen (ADVIL,MOTRIN) 800 MG tablet Take 1 tablet (800 mg total) by mouth every 8 (eight) hours as needed. 60 tablet 1  . losartan-hydrochlorothiazide (HYZAAR) 100-25 MG tablet Take 1 tablet by mouth daily. 90 tablet 3  . traMADol (ULTRAM) 50 MG tablet Take 1 tablet (50 mg total) by mouth every 6 (six) hours as needed. 60 tablet 1  . zolpidem (AMBIEN) 5 MG tablet Take 1 tablet (5 mg total) by mouth at bedtime as needed for sleep. 30 tablet 3  . gabapentin (NEURONTIN) 300 MG capsule Take 2 capsules (600 mg total) by mouth 3 (three) times daily. (Patient not taking: Reported on 07/31/2016) 90 capsule 1   No current facility-administered medications on file prior to visit.          Review of Systems:  Review of Systems  Constitutional: Denied constitutional symptoms, night sweats, recent illness, fatigue, fever, insomnia and weight loss.  Eyes: Denied eye symptoms, eye pain, photophobia, vision change and visual disturbance.  Ears/Nose/Throat/Neck: Denied ear, nose, throat or neck symptoms, hearing loss, nasal discharge, sinus congestion and sore throat.  Cardiovascular: Denied cardiovascular symptoms, arrhythmia, chest pain/pressure, edema, exercise intolerance, orthopnea and palpitations.  Respiratory: Denied pulmonary symptoms, asthma, pleuritic pain, productive sputum, cough, dyspnea and wheezing.  Gastrointestinal: Denied, gastro-esophageal reflux, melena, nausea and vomiting.  Genitourinary: Denied genitourinary symptoms including symptomatic vaginal discharge, pelvic relaxation issues, and urinary complaints.  Musculoskeletal: Denied musculoskeletal symptoms, stiffness, swelling, muscle weakness and myalgia.  Dermatologic: Denied dermatology symptoms, rash and scar.  Neurologic: Denied neurology symptoms, dizziness, headache, neck pain and syncope.  Psychiatric: Denied psychiatric symptoms, anxiety and depression.  Endocrine: Denied endocrine symptoms including hot flashes and night sweats.   Meds:   Current Outpatient Prescriptions on File Prior to Visit  Medication Sig Dispense Refill  . amLODipine (NORVASC) 5 MG tablet Take 1 tablet (5 mg total) by mouth daily. 30 tablet 1  . bisoprolol-hydrochlorothiazide (ZIAC) 10-6.25 MG tablet Take 1 tablet by mouth daily.    Marland Kitchen escitalopram (LEXAPRO) 10 MG tablet Take 1 tablet (10 mg total) by mouth daily. 30 tablet 5  . ibuprofen (ADVIL,MOTRIN) 800 MG tablet Take 1 tablet (800 mg total) by mouth every 8 (eight) hours as needed. 60 tablet 1  . losartan-hydrochlorothiazide (HYZAAR) 100-25 MG tablet Take 1 tablet by mouth daily. 90 tablet 3  . traMADol (ULTRAM) 50 MG tablet Take 1 tablet (50 mg total) by mouth every 6 (six) hours as needed. 60 tablet 1  . zolpidem (AMBIEN) 5 MG tablet Take 1 tablet (5 mg total) by mouth at bedtime as needed for sleep. 30 tablet 3  . gabapentin (NEURONTIN) 300 MG capsule Take 2 capsules (600 mg total) by mouth 3 (three) times daily. (Patient not taking: Reported on 07/31/2016) 90 capsule 1   No current facility-administered medications on file prior to visit.     Objective:     Vitals:   07/31/16 1356  BP: 124/79  Pulse: 83              Abdomen soft and nontender. Surgical wounds healed well.  We discussed her pathology findings in detail.  Assessment:    [id number] Patient Active Problem List   Diagnosis Date Noted  . Osteoarthritis of spine with radiculopathy, lumbar region 06/20/2016  . Strep pharyngitis 03/30/2016  . H/O abdominal surgery 02/09/2016  . Viral upper respiratory infection 08/20/2015  .  Chronic pelvic pain in female 11/30/2014  . Endometriosis 11/30/2014  . Anxiety 09/01/2014  . Essential (primary) hypertension 09/01/2014  . Cyst of ovary 09/01/2014  . Class 2 obesity 09/01/2014  . Headache 07/22/2014  . Abdominal cramping 12/11/2013  . Anxiety state, unspecified 12/11/2013  . Routine general medical examination at a health care facility 04/06/2011  . Metabolic syndrome 04/06/2011  . Insomnia 12/20/2007  . ALLERGIC RHINITIS 07/18/2007  . HYPERLIPIDEMIA 02/19/2007     1. Pelvic pain in female   2. Post-operative state   3. Endometriosis   4. Surgical menopause   5. Initiation of OCP (BCP)        Plan:            1.  Begin OCPs for endometriosis suppression. We will have her take those in a continuous manner. This will also provide her with ERT to prevent menopausal symptoms. We have discussed starting OCPs and its effect on blood pressure and she will have her antihypertension medication adjusted if necessary.   2.  She will follow up with Dr. Valentino Saxon for her postop check and OCP check in 4 weeks.       F/U  Return in about 4 weeks (around 08/28/2016). I spent 19 minutes with this patient of which greater than 50% was spent discussing endometriosis, surgical findings, use of OCPs for menopause and endometriosis, future follow-up.  Elonda Husky, M.D. 07/31/2016 2:53 PM

## 2016-08-01 ENCOUNTER — Ambulatory Visit: Payer: BLUE CROSS/BLUE SHIELD | Admitting: Sports Medicine

## 2016-08-02 ENCOUNTER — Encounter: Payer: Self-pay | Admitting: Obstetrics and Gynecology

## 2016-08-03 ENCOUNTER — Other Ambulatory Visit: Payer: Self-pay

## 2016-08-03 DIAGNOSIS — G8918 Other acute postprocedural pain: Secondary | ICD-10-CM

## 2016-08-03 MED ORDER — TRAMADOL HCL 50 MG PO TABS: 50.0000 mg | ORAL_TABLET | Freq: Four times a day (QID) | ORAL | 0 refills | Status: DC | PRN

## 2016-08-03 NOTE — Telephone Encounter (Signed)
See my chart message for further documentation.  

## 2016-08-07 ENCOUNTER — Encounter: Payer: Self-pay | Admitting: Obstetrics and Gynecology

## 2016-08-08 ENCOUNTER — Other Ambulatory Visit: Payer: Self-pay | Admitting: Obstetrics and Gynecology

## 2016-08-08 DIAGNOSIS — G8918 Other acute postprocedural pain: Secondary | ICD-10-CM

## 2016-08-08 MED ORDER — TRAMADOL HCL 50 MG PO TABS: 50.0000 mg | ORAL_TABLET | Freq: Four times a day (QID) | ORAL | 0 refills | Status: DC | PRN

## 2016-08-16 ENCOUNTER — Encounter: Payer: Self-pay | Admitting: Obstetrics and Gynecology

## 2016-08-18 ENCOUNTER — Other Ambulatory Visit: Payer: Self-pay | Admitting: Obstetrics and Gynecology

## 2016-08-18 DIAGNOSIS — G8918 Other acute postprocedural pain: Secondary | ICD-10-CM

## 2016-08-18 MED ORDER — TRAMADOL HCL 50 MG PO TABS: 50.0000 mg | ORAL_TABLET | Freq: Four times a day (QID) | ORAL | 0 refills | Status: DC | PRN

## 2016-08-25 ENCOUNTER — Other Ambulatory Visit: Payer: Self-pay | Admitting: Emergency Medicine

## 2016-08-25 ENCOUNTER — Encounter: Payer: Self-pay | Admitting: Sports Medicine

## 2016-08-25 ENCOUNTER — Encounter: Payer: Self-pay | Admitting: Family Medicine

## 2016-08-25 ENCOUNTER — Other Ambulatory Visit: Payer: Self-pay

## 2016-08-25 DIAGNOSIS — I10 Essential (primary) hypertension: Secondary | ICD-10-CM

## 2016-08-25 DIAGNOSIS — G8918 Other acute postprocedural pain: Secondary | ICD-10-CM

## 2016-08-25 MED ORDER — TRAMADOL HCL 50 MG PO TABS: ORAL_TABLET | ORAL | 0 refills | Status: DC

## 2016-08-25 MED ORDER — AMLODIPINE BESYLATE 5 MG PO TABS: 5.0000 mg | ORAL_TABLET | Freq: Every day | ORAL | 1 refills | Status: DC

## 2016-08-28 ENCOUNTER — Telehealth: Payer: Self-pay | Admitting: Sports Medicine

## 2016-08-28 ENCOUNTER — Encounter: Payer: Self-pay | Admitting: Family Medicine

## 2016-08-28 ENCOUNTER — Encounter: Payer: BLUE CROSS/BLUE SHIELD | Admitting: Obstetrics and Gynecology

## 2016-08-28 ENCOUNTER — Ambulatory Visit: Payer: BLUE CROSS/BLUE SHIELD | Admitting: Sports Medicine

## 2016-08-28 ENCOUNTER — Other Ambulatory Visit: Payer: Self-pay | Admitting: Family Medicine

## 2016-08-28 DIAGNOSIS — Z0289 Encounter for other administrative examinations: Secondary | ICD-10-CM

## 2016-08-28 DIAGNOSIS — I10 Essential (primary) hypertension: Secondary | ICD-10-CM

## 2016-08-28 MED ORDER — AMLODIPINE BESYLATE 5 MG PO TABS: 5.0000 mg | ORAL_TABLET | Freq: Every day | ORAL | 5 refills | Status: DC

## 2016-08-28 MED ORDER — HYDROCODONE-HOMATROPINE 5-1.5 MG/5ML PO SYRP: 5.0000 mL | ORAL_SOLUTION | Freq: Three times a day (TID) | ORAL | 0 refills | Status: DC | PRN

## 2016-08-28 NOTE — Telephone Encounter (Signed)
Yes, that will be fine.

## 2016-08-28 NOTE — Telephone Encounter (Signed)
Patient called to cancel appointment for today due to another appointment. OK to reschedule patient for Monday 09/04/16?

## 2016-08-29 ENCOUNTER — Ambulatory Visit (INDEPENDENT_AMBULATORY_CARE_PROVIDER_SITE_OTHER): Payer: BLUE CROSS/BLUE SHIELD | Admitting: Obstetrics and Gynecology

## 2016-08-29 ENCOUNTER — Encounter: Payer: Self-pay | Admitting: Obstetrics and Gynecology

## 2016-08-29 VITALS — BP 103/70 | HR 56 | Ht 65.0 in | Wt 216.4 lb

## 2016-08-29 DIAGNOSIS — Z90721 Acquired absence of ovaries, unilateral: Secondary | ICD-10-CM

## 2016-08-29 DIAGNOSIS — N809 Endometriosis, unspecified: Secondary | ICD-10-CM

## 2016-08-29 DIAGNOSIS — G4700 Insomnia, unspecified: Secondary | ICD-10-CM

## 2016-08-29 DIAGNOSIS — E8940 Asymptomatic postprocedural ovarian failure: Secondary | ICD-10-CM

## 2016-08-29 DIAGNOSIS — Z79890 Hormone replacement therapy (postmenopausal): Secondary | ICD-10-CM

## 2016-08-29 MED ORDER — NORETHIN ACE-ETH ESTRAD-FE 1-20 MG-MCG PO TABS: 1.0000 | ORAL_TABLET | Freq: Every day | ORAL | 11 refills | Status: DC

## 2016-08-29 NOTE — Progress Notes (Signed)
GYNECOLOGY PROGRESS NOTE  Subjective:    Patient ID: [name], female    DOB: [birth year], 36 y.o.   MRN: [id number]  HPI  Patient is a 36 y.o. [id number] female who presents for follow up after surgery.  Patient underwent laparoscopic right oophorectomywith lysis of adhesions and excision/fulguration of endometriosis approximately 4 weeks ago.  Patient notes that since her last visit she was having some moderate incisional pain on her right side near incision site, however that has finally resolved. Patient still notes that she has some occasional twinges of pain that stops her in her tracks and may last for 10-15 minutes however this is usually controlled with tramadol.  Patient notes that during the last visit she was changed from an estrogen patch to a birth control pill. Notes that this is been working well to control her menopausal vasomotor symptoms except for her night sweats.  Patient also reporting worsening insomnia despite use of Ambien prn.   The following portions of the patient's history were reviewed and updated as appropriate: allergies, current medications, past family history, past medical history, past social history, past surgical history and problem list.  Review of Systems Pertinent items noted in HPI and remainder of comprehensive ROS otherwise negative.   Objective:   Blood pressure 103/70, pulse (!) 56, height 5\' 5"  (1.651 m), weight 216 lb 6.4 oz (98.2 kg), last menstrual period 10/15/2013. General appearance: alert and no distress Abdomen: soft, non-tender; bowel sounds normal; no masses,  no organomegaly.  Incision sites well healed, nontender. Pelvic: Deferred   Assessment:   Endometriosis Status post laparoscopic right oophorectomy.  Has previously had a hysterectomy and LSO at different intervals.  Surgical menopausal vasomotor symptoms Insomnia (trouble staying asleep) Incisional pain (resolved)  Plan:   1. Patient to continue on combined  OCPs for management of surgical menopausal symptoms as well as to control any microscopic implants of endometriosis and hopefully prevent any future flares.  Advised that patient will be on this therapy long-term until she is close to the age of menopause. 2. Patient with a history of chronic pain due to endometriosis, also has history of delayed healing time. However patient is noting vast improvement in symptoms, and now no longer requires stronger narcotic pain medication, however is still taking her tramadol as needed. Expressed with patient that the goal was to ultimately be able to manage pain symptoms with or over-the-counter medication such as Tylenol or Advil.  Reviewed patient's pathology and details of surgery, including review of photos taken during the case.  3. Discussion options for management of patient's night sweats, including medications such as Brisdelle or other SSRI, gabapentin, or clonidine.  Also discussed herbal remedies such as evening primrose oil peppermint oil and black cohosh. Patient notes that she would like to try natural remedies as she would like to avoid having to begin another medication.  Discussed that her hot flushes at night may be a major factor in her insomnia.  Patient wonders if she should go up on her Ambien to 10 mg. I discussed with patient that we should try to attempt to get her hot flushes under control first, as this may help to improve her sleeping through the night if she is comfortable. Also discussed other measures such as minimizing clothing and bedding, sleeping with a fan or air conditioner, having cold water beside the bed. She  Advised patient to follow up if symptoms do not improve or worsen, otherwise she may follow up  as needed.  A total of 15  minutes were spent face-to-face with the patient during this encounter and over half of that time involved counseling and coordination of care.  Hildred Laserherry, Nabil Bubolz, MD [facility]

## 2016-09-04 ENCOUNTER — Ambulatory Visit: Payer: BLUE CROSS/BLUE SHIELD | Admitting: Sports Medicine

## 2016-09-04 DIAGNOSIS — Z0289 Encounter for other administrative examinations: Secondary | ICD-10-CM

## 2016-09-05 ENCOUNTER — Encounter: Payer: Self-pay | Admitting: Sports Medicine

## 2016-09-07 ENCOUNTER — Other Ambulatory Visit: Payer: Self-pay

## 2016-09-07 ENCOUNTER — Other Ambulatory Visit: Payer: Self-pay | Admitting: Sports Medicine

## 2016-09-07 DIAGNOSIS — G8918 Other acute postprocedural pain: Secondary | ICD-10-CM

## 2016-09-07 MED ORDER — TRAMADOL HCL 50 MG PO TABS: ORAL_TABLET | ORAL | 0 refills | Status: DC

## 2016-09-08 ENCOUNTER — Encounter: Payer: Self-pay | Admitting: Obstetrics and Gynecology

## 2016-09-08 ENCOUNTER — Ambulatory Visit: Payer: BLUE CROSS/BLUE SHIELD | Admitting: Sports Medicine

## 2016-09-08 ENCOUNTER — Other Ambulatory Visit: Payer: Self-pay

## 2016-09-08 DIAGNOSIS — Z0289 Encounter for other administrative examinations: Secondary | ICD-10-CM

## 2016-09-08 DIAGNOSIS — R110 Nausea: Secondary | ICD-10-CM

## 2016-09-08 MED ORDER — PROMETHAZINE HCL 25 MG PO TABS: 25.0000 mg | ORAL_TABLET | Freq: Four times a day (QID) | ORAL | 1 refills | Status: DC | PRN

## 2016-09-08 MED ORDER — ONDANSETRON HCL 4 MG PO TABS: 4.0000 mg | ORAL_TABLET | Freq: Three times a day (TID) | ORAL | 0 refills | Status: DC | PRN

## 2016-09-08 NOTE — Progress Notes (Deleted)
OFFICE VISIT NOTE [name]. [facility] [phone]  GERICA KOBLE - 36 y.o. female MRN [id number]  Date of birth: [birth year]  Visit Date: 09/08/2016  PCP: Emi Belfast, FNP   Referred by: Emi Belfast, FNP  Orlie Dakin, CMA acting as scribe for Dr. Berline Chough.  SUBJECTIVE:  No chief complaint on file.  HPI: As below and per problem based documentation when appropriate.  Pt presents today for follow-up of left posterior leg, hip, and buttock pain. Pain is chronic but has worsened over the past 4-5 months. The pain seems to be most focal on the lateral aspect of her left leg at night and sometimes wakes her from sleep.  {Mechanism/Context}  The pain is described as {character} and is rated as {severity}.  Worsened with {exacerbating} Improves with movement Therapies tried include : Pt has tried Meloxicam and gotten little relief.   Other associated symptoms include: radiation down the posterior aspect of leg into great toe.    Pt. denies fevers, chills, recent weight gain or weight loss.  No night sweats. No significant nighttime awakenings due to this issue.  Otherwise per HPI.    ROS  Otherwise per HPI.  HISTORY & PERTINENT PRIOR DATA:  No specialty comments available. She reports that she has never smoked. She has never used smokeless tobacco. No results for input(s): HGBA1C, LABURIC in the last 8760 hours. Medications & Allergies reviewed per EMR Patient Active Problem List   Diagnosis Date Noted  . Osteoarthritis of spine with radiculopathy, lumbar region 06/20/2016  . Strep pharyngitis 03/30/2016  . H/O abdominal surgery 02/09/2016  . Viral upper respiratory infection 08/20/2015  . Chronic pelvic pain in female 11/30/2014  . Endometriosis 11/30/2014  . Anxiety 09/01/2014  . Essential (primary) hypertension 09/01/2014  . Cyst of ovary 09/01/2014  . Class 2 obesity 09/01/2014  .  Headache 07/22/2014  . Abdominal cramping 12/11/2013  . Anxiety state, unspecified 12/11/2013  . Routine general medical examination at a health care facility 04/06/2011  . Metabolic syndrome 04/06/2011  . Insomnia 12/20/2007  . ALLERGIC RHINITIS 07/18/2007  . HYPERLIPIDEMIA 02/19/2007   Past Medical History:  Diagnosis Date  . Abnormal Pap smear   . Anxiety   . Anxiety   . Cough    HAS COLD. PRODUCTIVE CLEAR. NO FEVER. NO WHEEZING. TO SEE PCP IF WORSENS  . Endometriosis   . HA (headache)    otc med prn  . Hyperlipidemia    diet controlled, no meds  . Hypertension   . PONV (postoperative nausea and vomiting)   . Pregnancy induced hypertension 2008   Family History  Problem Relation Age of Onset  . Hypertension Father   . Coronary artery disease Paternal Uncle   . Cancer Paternal Grandfather   . Hypertension Mother   . Breast cancer Paternal Aunt   . Ovarian cancer Maternal Grandmother   . Diabetes Neg Hx    Past Surgical History:  Procedure Laterality Date  . ABDOMINAL HYSTERECTOMY    . ABLATION ON ENDOMETRIOSIS N/A 06/04/2014   Procedure: ABLATION ON ENDOMETRIOSIS;  Surgeon: Leslie Andrea, MD;  Location: [facility];  Service: Gynecology;  Laterality: N/A;  . BIOPSY  12/01/2011   Procedure: BIOPSY;  Surgeon: Leslie Andrea, MD;  Location: [facility];  Service: Gynecology;  Laterality: N/A;  . CESAREAN SECTION  2008  . CESAREAN SECTION WITH BILATERAL TUBAL LIGATION N/A 12/02/2012   Procedure:  Repeat Cesarean Section Delivery Baby Girl @ 1826, Apgars 8/9, Bilateral Tubal Ligation;  Surgeon: Leslie AndreaJames E Tomblin II, MD;  Location: [facility];  Service: Obstetrics;  Laterality: N/A;  . COLPOSCOPY     as teenager, results WNL  . ENDOMETRIAL BIOPSY    . LAPAROSCOPIC ASSISTED VAGINAL HYSTERECTOMY  2012   with bilateral salpingectomy  . LAPAROSCOPIC BILATERAL SALPINGECTOMY Bilateral 02/15/2015   Procedure: LAPAROSCOPIC BILATERAL SALPINGECTOMY;  Surgeon: Hildred LaserAnika Cherry, MD;  Location:  [facility];  Service: Gynecology;  Laterality: Bilateral;  . LAPAROSCOPIC LYSIS OF ADHESIONS N/A 06/04/2014   Procedure: LAPAROSCOPIC LYSIS OF ADHESIONS;  Surgeon: Leslie AndreaJames E Tomblin II, MD;  Location: [facility];  Service: Gynecology;  Laterality: N/A;  . LAPAROSCOPIC SALPINGO OOPHERECTOMY Right 07/17/2016   Procedure: LAPAROSCOPIC RIGHT SALPINGO OOPHORECTOMY;  Surgeon: Hildred LaserAnika Cherry, MD;  Location: [facility];  Service: Gynecology;  Laterality: Right;  . LAPAROSCOPY  12/01/2011   Procedure: LAPAROSCOPY OPERATIVE;  Surgeon: Leslie AndreaJames E Tomblin II, MD;  Location: [facility];  Service: Gynecology;  Laterality: N/A;  . LAPAROSCOPY N/A 06/04/2014   Procedure: LAPAROSCOPY OPERATIVE;  Surgeon: Leslie AndreaJames E Tomblin II, MD;  Location: [facility];  Service: Gynecology;  Laterality: N/A;  . LAPAROSCOPY N/A 02/14/2016   Procedure: LAPAROSCOPY DIAGNOSTIC WITH BIOPSIES POSSIBLE ADHESIOLYSIS;  Surgeon: Hildred LaserAnika Cherry, MD;  Location: [facility];  Service: Gynecology;  Laterality: N/A;  . LAPAROSCOPY N/A 05/26/2016   Procedure: LAPAROSCOPY operative with lysis of adhesions;  Surgeon: Linzie Collinavid James Evans, MD;  Location: [facility];  Service: Gynecology;  Laterality: N/A;  . OOPHORECTOMY Left   . SALPINGECTOMY Bilateral   . TONSILLECTOMY    . TONSILLECTOMY    . TUBAL LIGATION    . TYMPANOSTOMY TUBE PLACEMENT     Social History   Occupational History  . [employer] officer [employer]    only once in a while  . 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 Smoking status: Never Smoker  . Smokeless tobacco: Never Used  . Alcohol use Yes     Comment: occasional  . Drug use: No  . Sexual activity: Yes    Birth control/ protection: Surgical, Pill    OBJECTIVE:  VS:  HT:    WT:   BMI:     BP:   HR: bpm  TEMP: ( )  RESP:  EXAM: No additional findings.    No results found. ASSESSMENT & PLAN:  { }

## 2016-09-14 ENCOUNTER — Other Ambulatory Visit: Payer: Self-pay | Admitting: Obstetrics and Gynecology

## 2016-09-14 MED ORDER — ESTRADIOL 0.1 MG/24HR TD PTWK: 0.1000 mg | MEDICATED_PATCH | TRANSDERMAL | 12 refills | Status: DC

## 2016-09-15 ENCOUNTER — Ambulatory Visit: Payer: BLUE CROSS/BLUE SHIELD | Admitting: Sports Medicine

## 2016-09-15 ENCOUNTER — Encounter: Payer: Self-pay | Admitting: Sports Medicine

## 2016-09-15 DIAGNOSIS — Z0289 Encounter for other administrative examinations: Secondary | ICD-10-CM

## 2016-09-18 ENCOUNTER — Other Ambulatory Visit: Payer: Self-pay | Admitting: Sports Medicine

## 2016-09-18 DIAGNOSIS — G8918 Other acute postprocedural pain: Secondary | ICD-10-CM

## 2016-09-18 NOTE — Telephone Encounter (Signed)
Rx called in 

## 2016-09-18 NOTE — Telephone Encounter (Signed)
Last refilled 09/05/16 #30/0 refills. Pt has appointment scheduled for 09/23/15.

## 2016-09-18 NOTE — Telephone Encounter (Signed)
Will call in until next appointment but no further refills until seen

## 2016-09-18 NOTE — Telephone Encounter (Signed)
Actually, looks like it was called in 09/07/16.

## 2016-09-22 ENCOUNTER — Encounter: Payer: Self-pay | Admitting: Sports Medicine

## 2016-09-22 ENCOUNTER — Ambulatory Visit: Payer: BLUE CROSS/BLUE SHIELD | Admitting: Sports Medicine

## 2016-09-25 ENCOUNTER — Telehealth: Payer: Self-pay | Admitting: Sports Medicine

## 2016-09-25 NOTE — Telephone Encounter (Signed)
Left vm asking patient to return call to discuss scheduling any future appointments.

## 2016-09-26 ENCOUNTER — Encounter: Payer: Self-pay | Admitting: Sports Medicine

## 2016-09-26 NOTE — Telephone Encounter (Signed)
Patient returning phone call. There was not much in the note to advise patient per Amalia Haileyustin and Mellody DanceKeith was gone for the day. I asked Amalia HaileyDustin to speak with Dr. Berline Choughigby who advised that he would see her again if she calls the same day she wants to come in to make an appointment.   I advised the patient of this and she verbally expressed understanding. No further action required at this time.

## 2016-09-27 ENCOUNTER — Other Ambulatory Visit: Payer: Self-pay | Admitting: Sports Medicine

## 2016-09-27 ENCOUNTER — Other Ambulatory Visit: Payer: Self-pay | Admitting: Obstetrics and Gynecology

## 2016-09-27 ENCOUNTER — Other Ambulatory Visit: Payer: Self-pay

## 2016-09-27 DIAGNOSIS — I10 Essential (primary) hypertension: Secondary | ICD-10-CM

## 2016-09-27 DIAGNOSIS — G8918 Other acute postprocedural pain: Secondary | ICD-10-CM

## 2016-09-27 MED ORDER — TRAMADOL HCL 50 MG PO TABS: ORAL_TABLET | ORAL | 0 refills | Status: DC

## 2016-10-02 ENCOUNTER — Encounter: Payer: Self-pay | Admitting: Obstetrics and Gynecology

## 2016-10-05 ENCOUNTER — Encounter: Payer: Self-pay | Admitting: Obstetrics and Gynecology

## 2016-10-19 ENCOUNTER — Encounter: Payer: Self-pay | Admitting: Obstetrics and Gynecology

## 2016-10-19 ENCOUNTER — Other Ambulatory Visit: Payer: Self-pay | Admitting: Obstetrics and Gynecology

## 2016-10-19 DIAGNOSIS — G8918 Other acute postprocedural pain: Secondary | ICD-10-CM

## 2016-10-20 ENCOUNTER — Other Ambulatory Visit: Payer: Self-pay | Admitting: Family Medicine

## 2016-10-20 ENCOUNTER — Encounter: Payer: Self-pay | Admitting: Family Medicine

## 2016-10-20 MED ORDER — HYDROCODONE-HOMATROPINE 5-1.5 MG/5ML PO SYRP: 5.0000 mL | ORAL_SOLUTION | Freq: Three times a day (TID) | ORAL | 0 refills | Status: DC | PRN

## 2016-10-23 ENCOUNTER — Other Ambulatory Visit: Payer: Self-pay | Admitting: Obstetrics and Gynecology

## 2016-10-23 DIAGNOSIS — G8918 Other acute postprocedural pain: Secondary | ICD-10-CM

## 2016-10-23 MED ORDER — TRAMADOL HCL 50 MG PO TABS: 50.0000 mg | ORAL_TABLET | Freq: Four times a day (QID) | ORAL | 2 refills | Status: DC | PRN

## 2016-10-27 ENCOUNTER — Other Ambulatory Visit: Payer: Self-pay | Admitting: Obstetrics and Gynecology

## 2016-10-27 ENCOUNTER — Encounter: Payer: Self-pay | Admitting: Obstetrics and Gynecology

## 2016-10-27 MED ORDER — ZOLPIDEM TARTRATE 5 MG PO TABS: 5.0000 mg | ORAL_TABLET | Freq: Every evening | ORAL | 3 refills | Status: DC | PRN

## 2016-11-01 ENCOUNTER — Encounter: Payer: Self-pay | Admitting: Family Medicine

## 2016-11-01 ENCOUNTER — Ambulatory Visit (INDEPENDENT_AMBULATORY_CARE_PROVIDER_SITE_OTHER): Payer: BLUE CROSS/BLUE SHIELD | Admitting: Family Medicine

## 2016-11-01 VITALS — BP 118/82 | HR 69 | Temp 98.7°F | Ht 65.0 in | Wt 220.0 lb

## 2016-11-01 DIAGNOSIS — J0111 Acute recurrent frontal sinusitis: Secondary | ICD-10-CM

## 2016-11-01 DIAGNOSIS — J029 Acute pharyngitis, unspecified: Secondary | ICD-10-CM

## 2016-11-01 DIAGNOSIS — R05 Cough: Secondary | ICD-10-CM | POA: Diagnosis not present

## 2016-11-01 DIAGNOSIS — L301 Dyshidrosis [pompholyx]: Secondary | ICD-10-CM | POA: Diagnosis not present

## 2016-11-01 DIAGNOSIS — R059 Cough, unspecified: Secondary | ICD-10-CM

## 2016-11-01 LAB — POCT RAPID STREP A (OFFICE): Rapid Strep A Screen: NEGATIVE

## 2016-11-01 MED ORDER — TRIAMCINOLONE ACETONIDE 0.1 % EX CREA: 1.0000 "application " | TOPICAL_CREAM | Freq: Two times a day (BID) | CUTANEOUS | 0 refills | Status: DC

## 2016-11-01 MED ORDER — HYDROCODONE-HOMATROPINE 5-1.5 MG/5ML PO SYRP: 5.0000 mL | ORAL_SOLUTION | Freq: Three times a day (TID) | ORAL | 0 refills | Status: DC | PRN

## 2016-11-01 MED ORDER — DOXYCYCLINE HYCLATE 100 MG PO CAPS: 100.0000 mg | ORAL_CAPSULE | Freq: Two times a day (BID) | ORAL | 0 refills | Status: DC

## 2016-11-01 NOTE — Progress Notes (Signed)
Pre visit review using our clinic review tool, if applicable. No additional management support is needed unless otherwise documented below in the visit note. 

## 2016-11-01 NOTE — Patient Instructions (Addendum)
For nasal congestion you can use Afrin nasal spray for 3 days max, Sudafed, saline nasal spray (generic is fine for all). For cough you can try Delsym. Drink enough fluids to make your urine light yellow. For fever/chill/muscle aches you can take over the counter acetaminophen or ibuprofen.  Please come back in if you are not better in 5-7 days or if you develop wheezing, shortness of breath or persistent vomiting.  If not better in 5-7 days, can start antibiotic   Hand Dermatitis Hand dermatitis is a skin condition that causes small, itchy, raised dots or fluid-filled blisters to form over the palms of the hands. This condition may also be called hand eczema. What are the causes? The cause of this condition is not known. What increases the risk? This condition is more likely to develop in people who have a history of allergies, such as:  Hay fever.  Allergic asthma.  An allergy to latex.  Chemical exposure, injuries, and environmental irritants can make hand dermatitis worse. Washing your hands too often can remove natural oils, which can dry out the skin and contribute to outbreaks of this condition. What are the signs or symptoms? The most common symptom of this condition is intense itchiness. Cracks or grooves (fissures) on the fingers can also develop. Affected areas can be painful, especially areas where large blisters have formed. How is this diagnosed? This condition is diagnosed with a medical history and physical exam. How is this treated? This condition is treated with medicines, including:  Steroid creams and ointments.  Oral steroid medicines.  Antibiotic medicines. These are prescribed if you have an infection.  Antihistamine medicines. These help to reduce itchiness.  Follow these instructions at home:  Take or apply over-the-counter and prescription medicines only as told by your health care provider.  If you were prescribed an antibiotic medicine, use it as  told by your health care provider. Do not stop using the antibiotic even if you start to feel better.  Avoid washing your hands more often than necessary.  Avoid using harsh chemicals on your hands.  Wear protective gloves when you handle products that can irritate your skin.  Keep all follow-up visits as told by your health care provider. This is important. Contact a health care provider if:  Your rash does not improve during the first week of treatment.  Your rash is red or tender.  Your rash has pus coming from it.  Your rash spreads. This information is not intended to replace advice given to you by your health care provider. Make sure you discuss any questions you have with your health care provider. Document Released: 04/03/2005 Document Revised: 09/09/2015 Document Reviewed: 10/16/2014 Elsevier Interactive Patient Education  [employer]2018 [employer]

## 2016-11-01 NOTE — Progress Notes (Signed)
Subjective:    Patient ID: [name], female    DOB: [birth year], 36 y.o.   MRN: [id number]  HPI This is a 36 yo female who presents today with sore throat, nasal drainage/congestion and cough x about 2 weeks. Has been around multiple people with strep throat.  Having cough with slight green sputum. Has been taking daily Claritin and ibuprofen. No wheeze or SOB. No fever. More cough at night. Has been using hycodan cough syrup with improvement. Left ear pain started today.   Has noticed blisters and peeling of hands. Has been going on off and on for years. Worse in the summer. Has tried lotions without improvement. Will have cracking and pain.    Past Medical History:  Diagnosis Date  . Abnormal Pap smear   . Anxiety   . Anxiety   . Cough    HAS COLD. PRODUCTIVE CLEAR. NO FEVER. NO WHEEZING. TO SEE PCP IF WORSENS  . Endometriosis   . HA (headache)    otc med prn  . Hyperlipidemia    diet controlled, no meds  . Hypertension   . PONV (postoperative nausea and vomiting)   . Pregnancy induced hypertension 2008   Past Surgical History:  Procedure Laterality Date  . ABDOMINAL HYSTERECTOMY    . ABLATION ON ENDOMETRIOSIS N/A 06/04/2014   Procedure: ABLATION ON ENDOMETRIOSIS;  Surgeon: Leslie Andrea, MD;  Location: [facility];  Service: Gynecology;  Laterality: N/A;  . BIOPSY  12/01/2011   Procedure: BIOPSY;  Surgeon: Leslie Andrea, MD;  Location: [facility];  Service: Gynecology;  Laterality: N/A;  . CESAREAN SECTION  2008  . CESAREAN SECTION WITH BILATERAL TUBAL LIGATION N/A 12/02/2012   Procedure: Repeat Cesarean Section Delivery Baby Girl @ 1826, Apgars 8/9, Bilateral Tubal Ligation;  Surgeon: Leslie Andrea, MD;  Location: [facility];  Service: Obstetrics;  Laterality: N/A;  . COLPOSCOPY     as teenager, results WNL  . ENDOMETRIAL BIOPSY    . LAPAROSCOPIC ASSISTED VAGINAL HYSTERECTOMY  2012   with bilateral salpingectomy  . LAPAROSCOPIC BILATERAL SALPINGECTOMY Bilateral  02/15/2015   Procedure: LAPAROSCOPIC BILATERAL SALPINGECTOMY;  Surgeon: Hildred Laser, MD;  Location: [facility];  Service: Gynecology;  Laterality: Bilateral;  . LAPAROSCOPIC LYSIS OF ADHESIONS N/A 06/04/2014   Procedure: LAPAROSCOPIC LYSIS OF ADHESIONS;  Surgeon: Leslie Andrea, MD;  Location: [facility];  Service: Gynecology;  Laterality: N/A;  . LAPAROSCOPIC SALPINGO OOPHERECTOMY Right 07/17/2016   Procedure: LAPAROSCOPIC RIGHT SALPINGO OOPHORECTOMY;  Surgeon: Hildred Laser, MD;  Location: [facility];  Service: Gynecology;  Laterality: Right;  . LAPAROSCOPY  12/01/2011   Procedure: LAPAROSCOPY OPERATIVE;  Surgeon: Leslie Andrea, MD;  Location: [facility];  Service: Gynecology;  Laterality: N/A;  . LAPAROSCOPY N/A 06/04/2014   Procedure: LAPAROSCOPY OPERATIVE;  Surgeon: Leslie Andrea, MD;  Location: [facility];  Service: Gynecology;  Laterality: N/A;  . LAPAROSCOPY N/A 02/14/2016   Procedure: LAPAROSCOPY DIAGNOSTIC WITH BIOPSIES POSSIBLE ADHESIOLYSIS;  Surgeon: Hildred Laser, MD;  Location: [facility];  Service: Gynecology;  Laterality: N/A;  . LAPAROSCOPY N/A 05/26/2016   Procedure: LAPAROSCOPY operative with lysis of adhesions;  Surgeon: Linzie Collin, MD;  Location: [facility];  Service: Gynecology;  Laterality: N/A;  . OOPHORECTOMY Left   . SALPINGECTOMY Bilateral   . TONSILLECTOMY    . TONSILLECTOMY    . TUBAL LIGATION    . TYMPANOSTOMY TUBE PLACEMENT     Family History  Problem Relation Age of  Onset  . Hypertension Father   . Coronary artery disease Paternal Uncle   . Cancer Paternal Grandfather   . Hypertension Mother   . Breast cancer Paternal Aunt   . Ovarian cancer Maternal Grandmother   . Diabetes Neg Hx       Review of Systems Per HPI    Objective:   Physical Exam  Constitutional: She is oriented to person, place, and time. She appears well-developed and well-nourished. No distress.  Obese.   HENT:  Head: Normocephalic and atraumatic.  Right Ear: Tympanic membrane,  external ear and ear canal normal.  Left Ear: External ear and ear canal normal.  Nose: Mucosal edema present.  Mouth/Throat: Oropharynx is clear and moist.  Left TM dull.   Eyes: Conjunctivae are normal.  Neck: Normal range of motion. Neck supple.  Cardiovascular: Normal rate, regular rhythm and normal heart sounds.   Pulmonary/Chest: Effort normal and breath sounds normal.  Neurological: She is alert and oriented to person, place, and time.  Skin: Skin is warm and dry. She is not diaphoretic.  Areas around fingernails with peeling. No erythema or drainage.   Psychiatric: She has a normal mood and affect. Her behavior is normal. Judgment and thought content normal.  Vitals reviewed.     BP 118/82 (BP Location: Left Arm, Patient Position: Sitting, Cuff Size: Normal)   Pulse 69   Temp 98.7 F (37.1 C) (Oral)   Ht 5\' 5"  (1.651 m)   Wt 220 lb (99.8 kg)   LMP 10/15/2013   SpO2 100%   BMI 36.61 kg/m  Wt Readings from Last 3 Encounters:  11/01/16 220 lb (99.8 kg)  08/29/16 216 lb 6.4 oz (98.2 kg)  07/31/16 214 lb 7 oz (97.3 kg)   Results for orders placed or performed in visit on 11/01/16  POCT rapid strep A  Result Value Ref Range   Rapid Strep A Screen Negative Negative       Assessment & Plan:  1. Acute recurrent frontal sinusitis - likely viral, provided written and verbal information regarding symptomatic treatment and provided wait and see antibiotic if not better in 5-7 days - doxycycline (VIBRAMYCIN) 100 MG capsule; Take 1 capsule (100 mg total) by mouth 2 (two) times daily.  Dispense: 14 capsule; Refill: 0  2. Sore throat - negative raid strep - POCT rapid strep A  3. Cough - HYDROcodone-homatropine (HYCODAN) 5-1.5 MG/5ML syrup; Take 5 mLs by mouth every 8 (eight) hours as needed for cough.  Dispense: 60 mL; Refill: 0  4. Eczema, dyshidrotic - Provided written and verbal information regarding diagnosis and treatment. - triamcinolone cream (KENALOG) 0.1 %;  Apply 1 application topically 2 (two) times daily. For maximum 10 days  Dispense: 45 g; Refill: 0   Olean Reeeborah Kyia Rhude, FNP-BC  [facility] [place], [facility]  11/02/2016 7:09 AM

## 2016-11-10 ENCOUNTER — Other Ambulatory Visit: Payer: Self-pay | Admitting: Sports Medicine

## 2016-11-15 ENCOUNTER — Other Ambulatory Visit: Payer: Self-pay | Admitting: Obstetrics and Gynecology

## 2016-11-15 ENCOUNTER — Telehealth: Payer: Self-pay | Admitting: Obstetrics and Gynecology

## 2016-11-15 DIAGNOSIS — G8918 Other acute postprocedural pain: Secondary | ICD-10-CM

## 2016-11-15 NOTE — Telephone Encounter (Signed)
Pt calls back and states that she wants to clarify her pervious messages. Pt states that Dr.Cherry had discussed possibly resuming Danazol pt states that she already has a full bottle of this at home and wanted to know if it was ok to begin taking it. Advised pt to check date on bottle at which time she noticed that the prescription was expired. Pt states that if you want her to begin this she will need a new script. Pt also states that you discussed getting a new script for tramadol that will allow her to take 2 pill if needed so that she does not always run out earlier. Please advise.

## 2016-11-15 NOTE — Telephone Encounter (Signed)
Called pt no answer. LM for pt to call back.  

## 2016-11-15 NOTE — Telephone Encounter (Signed)
Dr Valentino Saxonherry mentioned sending in a script for her and she wants to confirm that she has an entire bottle - full script - please call and confirm dosage - she doesn't need another script sent in   Please call

## 2016-11-16 ENCOUNTER — Encounter: Payer: Self-pay | Admitting: Obstetrics and Gynecology

## 2016-11-16 ENCOUNTER — Other Ambulatory Visit: Payer: Self-pay

## 2016-11-16 ENCOUNTER — Other Ambulatory Visit: Payer: Self-pay | Admitting: Obstetrics and Gynecology

## 2016-11-16 ENCOUNTER — Encounter: Payer: Self-pay | Admitting: Family Medicine

## 2016-11-16 DIAGNOSIS — N719 Inflammatory disease of uterus, unspecified: Secondary | ICD-10-CM

## 2016-11-16 DIAGNOSIS — G8918 Other acute postprocedural pain: Secondary | ICD-10-CM

## 2016-11-16 MED ORDER — DANAZOL 200 MG PO CAPS: 400.0000 mg | ORAL_CAPSULE | Freq: Two times a day (BID) | ORAL | 3 refills | Status: DC

## 2016-11-16 MED ORDER — TRAMADOL HCL 50 MG PO TABS: 100.0000 mg | ORAL_TABLET | Freq: Four times a day (QID) | ORAL | 0 refills | Status: DC | PRN

## 2016-11-17 ENCOUNTER — Other Ambulatory Visit: Payer: Self-pay | Admitting: Obstetrics and Gynecology

## 2016-11-29 ENCOUNTER — Encounter: Payer: Self-pay | Admitting: Family Medicine

## 2016-12-01 ENCOUNTER — Ambulatory Visit (INDEPENDENT_AMBULATORY_CARE_PROVIDER_SITE_OTHER): Payer: BLUE CROSS/BLUE SHIELD | Admitting: Family Medicine

## 2016-12-01 ENCOUNTER — Telehealth: Payer: Self-pay | Admitting: Family Medicine

## 2016-12-01 ENCOUNTER — Encounter: Payer: Self-pay | Admitting: Family Medicine

## 2016-12-01 VITALS — BP 150/102 | HR 81 | Temp 98.3°F | Wt 217.0 lb

## 2016-12-01 DIAGNOSIS — M6740 Ganglion, unspecified site: Secondary | ICD-10-CM | POA: Diagnosis not present

## 2016-12-01 DIAGNOSIS — B9789 Other viral agents as the cause of diseases classified elsewhere: Secondary | ICD-10-CM | POA: Diagnosis not present

## 2016-12-01 DIAGNOSIS — J069 Acute upper respiratory infection, unspecified: Secondary | ICD-10-CM

## 2016-12-01 MED ORDER — HYDROCODONE-HOMATROPINE 5-1.5 MG/5ML PO SYRP: 5.0000 mL | ORAL_SOLUTION | Freq: Three times a day (TID) | ORAL | 0 refills | Status: DC | PRN

## 2016-12-01 NOTE — Patient Instructions (Addendum)
Take cetirizine daily Use afrin type nasal spray twice a day for 4 days max Use flonase twice a day for a week (after using AFrin) then at bedtime for 1-2 weeks.   Ganglion Cyst A ganglion cyst is a noncancerous, fluid-filled lump that occurs near joints or tendons. The ganglion cyst grows out of a joint or the lining of a tendon. It most often develops in the hand or wrist, but it can also develop in the shoulder, elbow, hip, knee, ankle, or foot. The round or oval ganglion cyst can be the size of a pea or larger than a grape. Increased activity may enlarge the size of the cyst because more fluid starts to build up. What are the causes? It is not known what causes a ganglion cyst to grow. However, it may be related to:  Inflammation or irritation around the joint.  An injury.  Repetitive movements or overuse.  Arthritis.  What increases the risk? Risk factors include:  Being a woman.  Being age 31-50.  What are the signs or symptoms? Symptoms may include:  A lump. This most often appears on the hand or wrist, but it can occur in other areas of the body.  Tingling.  Pain.  Numbness.  Muscle weakness.  Weak grip.  Less movement in a joint.  How is this diagnosed? Ganglion cysts are most often diagnosed based on a physical exam. Your health care provider will feel the lump and may shine a light alongside it. If it is a ganglion cyst, a light often shines through it. Your health care provider may order an X-ray, ultrasound, or MRI to rule out other conditions. How is this treated? Ganglion cysts usually go away on their own without treatment. If pain or other symptoms are involved, treatment may be needed. Treatment is also needed if the ganglion cyst limits your movement or if it gets infected. Treatment may include:  Wearing a brace or splint on your wrist or finger.  Taking anti-inflammatory medicine.  Draining fluid from the lump with a needle  (aspiration).  Injecting a steroid into the joint.  Surgery to remove the ganglion cyst.  Follow these instructions at home:  Do not press on the ganglion cyst, poke it with a needle, or hit it.  Take medicines only as directed by your health care provider.  Wear your brace or splint as directed by your health care provider.  Watch your ganglion cyst for any changes.  Keep all follow-up visits as directed by your health care provider. This is important. Contact a health care provider if:  Your ganglion cyst becomes larger or more painful.  You have increased redness, red streaks, or swelling.  You have pus coming from the lump.  You have weakness or numbness in the affected area.  You have a fever or chills. This information is not intended to replace advice given to you by your health care provider. Make sure you discuss any questions you have with your health care provider. Document Released: 03/31/2000 Document Revised: 09/09/2015 Document Reviewed: 09/16/2013 Elsevier Interactive Patient Education  2018 [employer].

## 2016-12-01 NOTE — Telephone Encounter (Signed)
Patient called and scheduled for appointment.

## 2016-12-01 NOTE — Progress Notes (Signed)
Subjective:    Patient ID: [name], female    DOB: [birth year], 36 y.o.   MRN: [id number]  HPI This is a 36 yo female who presents today with 3 days scratchy, sore throat. More nasal drainage over last 24 hours. Feels upper airway irritation. No fever. Tomorrow is her daughter's birthday. Has taken some Delsym.  Has some flonase at home and cetirizine but hasn't taken. Daughter has recently had stye and eye drainage. She is getting no relief from otc Delsym.    Past Medical History:  Diagnosis Date  . Abnormal Pap smear   . Anxiety   . Anxiety   . Cough    HAS COLD. PRODUCTIVE CLEAR. NO FEVER. NO WHEEZING. TO SEE PCP IF WORSENS  . Endometriosis   . HA (headache)    otc med prn  . Hyperlipidemia    diet controlled, no meds  . Hypertension   . PONV (postoperative nausea and vomiting)   . Pregnancy induced hypertension 2008   Past Surgical History:  Procedure Laterality Date  . ABDOMINAL HYSTERECTOMY    . ABLATION ON ENDOMETRIOSIS N/A 06/04/2014   Procedure: ABLATION ON ENDOMETRIOSIS;  Surgeon: Leslie Andrea, MD;  Location: [facility];  Service: Gynecology;  Laterality: N/A;  . BIOPSY  12/01/2011   Procedure: BIOPSY;  Surgeon: Leslie Andrea, MD;  Location: [facility];  Service: Gynecology;  Laterality: N/A;  . CESAREAN SECTION  2008  . CESAREAN SECTION WITH BILATERAL TUBAL LIGATION N/A 12/02/2012   Procedure: Repeat Cesarean Section Delivery Baby Girl @ 1826, Apgars 8/9, Bilateral Tubal Ligation;  Surgeon: Leslie Andrea, MD;  Location: [facility];  Service: Obstetrics;  Laterality: N/A;  . COLPOSCOPY     as teenager, results WNL  . ENDOMETRIAL BIOPSY    . LAPAROSCOPIC ASSISTED VAGINAL HYSTERECTOMY  2012   with bilateral salpingectomy  . LAPAROSCOPIC BILATERAL SALPINGECTOMY Bilateral 02/15/2015   Procedure: LAPAROSCOPIC BILATERAL SALPINGECTOMY;  Surgeon: Hildred Laser, MD;  Location: [facility];  Service: Gynecology;  Laterality: Bilateral;  . LAPAROSCOPIC LYSIS OF  ADHESIONS N/A 06/04/2014   Procedure: LAPAROSCOPIC LYSIS OF ADHESIONS;  Surgeon: Leslie Andrea, MD;  Location: [facility];  Service: Gynecology;  Laterality: N/A;  . LAPAROSCOPIC SALPINGO OOPHERECTOMY Right 07/17/2016   Procedure: LAPAROSCOPIC RIGHT SALPINGO OOPHORECTOMY;  Surgeon: Hildred Laser, MD;  Location: [facility];  Service: Gynecology;  Laterality: Right;  . LAPAROSCOPY  12/01/2011   Procedure: LAPAROSCOPY OPERATIVE;  Surgeon: Leslie Andrea, MD;  Location: [facility];  Service: Gynecology;  Laterality: N/A;  . LAPAROSCOPY N/A 06/04/2014   Procedure: LAPAROSCOPY OPERATIVE;  Surgeon: Leslie Andrea, MD;  Location: [facility];  Service: Gynecology;  Laterality: N/A;  . LAPAROSCOPY N/A 02/14/2016   Procedure: LAPAROSCOPY DIAGNOSTIC WITH BIOPSIES POSSIBLE ADHESIOLYSIS;  Surgeon: Hildred Laser, MD;  Location: [facility];  Service: Gynecology;  Laterality: N/A;  . LAPAROSCOPY N/A 05/26/2016   Procedure: LAPAROSCOPY operative with lysis of adhesions;  Surgeon: Linzie Collin, MD;  Location: [facility];  Service: Gynecology;  Laterality: N/A;  . OOPHORECTOMY Left   . SALPINGECTOMY Bilateral   . TONSILLECTOMY    . TONSILLECTOMY    . TUBAL LIGATION    . TYMPANOSTOMY TUBE PLACEMENT     Family History  Problem Relation Age of Onset  . Hypertension Father   . Coronary artery disease Paternal Uncle   . Cancer Paternal Grandfather   . Hypertension Mother   . Breast cancer Paternal Aunt   .  Ovarian cancer Maternal Grandmother   . Diabetes Neg Hx    Social History  Substance Use Topics  . Smoking status: Never Smoker  . Smokeless tobacco: Never Used  . Alcohol use Yes     Comment: occasional      Review of Systems Per HPI    Objective:   Physical Exam  Constitutional: She is oriented to person, place, and time. She appears well-developed and well-nourished. No distress.  HENT:  Head: Normocephalic and atraumatic.  Right Ear: External ear and ear canal normal.  Left Ear: External ear and  ear canal normal.  Mouth/Throat: Oropharynx is clear and moist.  Sounds congested. Bilateral TMs dull.   Eyes: Conjunctivae are normal.  Neck: Normal range of motion. Neck supple.  Cardiovascular: Normal rate, regular rhythm and normal heart sounds.   Pulmonary/Chest: Effort normal and breath sounds normal.  Musculoskeletal:       Hands: Lymphadenopathy:    She has no cervical adenopathy.  Neurological: She is alert and oriented to person, place, and time.  Skin: Skin is warm and dry. She is not diaphoretic.  Psychiatric: She has a normal mood and affect. Her behavior is normal. Judgment and thought content normal.  Vitals reviewed.   BP (!) 150/102 (BP Location: Right Arm, Patient Position: Sitting, Cuff Size: Normal)   Pulse 81   Temp 98.3 F (36.8 C) (Oral)   Wt 217 lb (98.4 kg)   LMP 10/15/2013   SpO2 98%   BMI 36.11 kg/m  Wt Readings from Last 3 Encounters:  12/01/16 217 lb (98.4 kg)  11/01/16 220 lb (99.8 kg)  08/29/16 216 lb 6.4 oz (98.2 kg)   Recheck blood pressure 146/98    Assessment & Plan:  1. Viral URI with cough - discussed symptomatic relief measures, encouraged adequate fluids and rest.  - RTC precautions reviewed - HYDROcodone-homatropine (HYCODAN) 5-1.5 MG/5ML syrup; Take 5 mLs by mouth every 8 (eight) hours as needed for cough.  Dispense: 60 mL; Refill: 0  2. Ganglion cyst - she will hold off on referral now   Olean Ree, FNP-BC  [facility] [place], [facility]  12/01/2016 5:02 PM

## 2016-12-12 ENCOUNTER — Other Ambulatory Visit: Payer: Self-pay | Admitting: Sports Medicine

## 2016-12-13 ENCOUNTER — Ambulatory Visit (INDEPENDENT_AMBULATORY_CARE_PROVIDER_SITE_OTHER): Payer: BLUE CROSS/BLUE SHIELD | Admitting: Sports Medicine

## 2016-12-13 ENCOUNTER — Encounter: Payer: Self-pay | Admitting: Sports Medicine

## 2016-12-13 VITALS — BP 112/82 | HR 70 | Ht 65.0 in | Wt 221.0 lb

## 2016-12-13 DIAGNOSIS — M4726 Other spondylosis with radiculopathy, lumbar region: Secondary | ICD-10-CM | POA: Diagnosis not present

## 2016-12-13 DIAGNOSIS — M5442 Lumbago with sciatica, left side: Secondary | ICD-10-CM | POA: Diagnosis not present

## 2016-12-13 DIAGNOSIS — G8929 Other chronic pain: Secondary | ICD-10-CM | POA: Diagnosis not present

## 2016-12-13 NOTE — Patient Instructions (Addendum)
We are ordering an MRI for you today.  The imaging office will be calling you to schedule your appointment after we obtain authorization from your insurance company.   Please be sure you have signed up for MyChart so that we can get your results to you.  We will be in touch with you as soon as we can.  Please know, it can take up to 3-4 business days for the radiologist and Dr. Berline Choughigby to have time to review the results and determine the best appropriate action.  If there is something that appears to be surgical or needs a referral to other specialists we will let you know through MyChart or telephone.  Otherwise we will plan to schedule a follow up appointment with Dr. Berline Choughigby once we have the results.   Also check out [employer]"Foundation Training" which is a program developed by Dr. Myles LippsEric Goodman.   There are links to a couple of his YouTube Videos below and I would like to see performing one of his videos 5-6 days per week.    A good intro video is: "Independence from Pain 7-minute Video" - [email or web address]   His more advanced video is: "Powerful Posture and Pain Relief: 12 minutes of Foundation Training" - [email or web address].[email or web address]  Do not try to attempt this entire video when first beginning.    Try breaking of each exercise that he goes into shorter segments.  Otherwise if they perform an exercise for 45 seconds, start with 15 seconds and rest and then resume when they begin the new activity.    If you work your way up to doing this 12 minute video, I expect you will see significant improvements in your pain.  If you enjoy his videos and would like to find out more you can look on his website: [email or web address].  He has a workout streaming option as well as a DVD set available for purchase.  Amazon has the best price for his DVDs.

## 2016-12-13 NOTE — Progress Notes (Signed)
OFFICE VISIT NOTE [name]. [facility] at [facility] [phone]  [name] - 36 y.o. female MRN [id number]  Date of birth: 15-Sep-1980  Visit Date: 12/13/2016  PCP: Emi Belfast, FNP   Referred by: Emi Belfast, FNP  Orlie Dakin, CMA acting as scribe for Dr. Berline Chough.  SUBJECTIVE:   Chief Complaint  Patient presents with  . Follow-up    low back pain   HPI: As below and per problem based documentation when appropriate.  [name] is an established patient presenting today in follow-up of low back pain. She was last seen 06/20/2016 and had xray of the lumbar spine, was prescribed a medrol dose pack, and given home therapeutic exercises.   Pt reports minimal change after taking steroid dose pack. She reports that back pain has gotten worse over the past week and a half. She is currently taking Gabapentin, Ibuprofen, and Tramadol (for endometriosis) with little relief. She has tried using heating pad and got temporary relief with that. Pain is described as a constant aching pain that radiates into the left glute. At times the pain is bilateral and radiates into both legs. She does have sharp pains occasionally when she is moving around. She also has pain when laying down, reports that it is very hard for her to get comfortable. Pain is at its worst at night and first thing in the morning. Today pain is rated as 6/10. Pt also reports pain in her mid-back, hips and groin.     Review of Systems  Constitutional: Negative for chills and fever.  Respiratory: Negative for shortness of breath and wheezing.   Cardiovascular: Negative for chest pain, palpitations and leg swelling.  Musculoskeletal: Positive for back pain. Negative for falls.  Neurological: Negative for dizziness, tingling and headaches.  Endo/Heme/Allergies: Does not bruise/bleed easily.    Otherwise per HPI.  HISTORY & PERTINENT PRIOR DATA:  No specialty  comments available. She reports that she has never smoked. She has never used smokeless tobacco. No results for input(s): HGBA1C, LABURIC in the last 8760 hours. Medications & Allergies reviewed per EMR Patient Active Problem List   Diagnosis Date Noted  . Osteoarthritis of spine with radiculopathy, lumbar region 06/20/2016  . Strep pharyngitis 03/30/2016  . H/O abdominal surgery 02/09/2016  . Viral upper respiratory infection 08/20/2015  . Chronic pelvic pain in female 11/30/2014  . Endometriosis 11/30/2014  . Anxiety 09/01/2014  . Essential (primary) hypertension 09/01/2014  . Cyst of ovary 09/01/2014  . Class 2 obesity 09/01/2014  . Headache 07/22/2014  . Abdominal cramping 12/11/2013  . Anxiety state, unspecified 12/11/2013  . Routine general medical examination at a health care facility 04/06/2011  . Metabolic syndrome 04/06/2011  . Insomnia 12/20/2007  . ALLERGIC RHINITIS 07/18/2007  . HYPERLIPIDEMIA 02/19/2007   Past Medical History:  Diagnosis Date  . Abnormal Pap smear   . Anxiety   . Anxiety   . Cough    HAS COLD. PRODUCTIVE CLEAR. NO FEVER. NO WHEEZING. TO SEE PCP IF WORSENS  . Endometriosis   . HA (headache)    otc med prn  . Hyperlipidemia    diet controlled, no meds  . Hypertension   . PONV (postoperative nausea and vomiting)   . Pregnancy induced hypertension 2008   Family History  Problem Relation Age of Onset  . Hypertension Father   . Coronary artery disease Paternal Uncle   . Cancer Paternal Grandfather   . Hypertension  Mother   . Breast cancer Paternal Aunt   . Ovarian cancer Maternal Grandmother   . Diabetes Neg Hx    Past Surgical History:  Procedure Laterality Date  . ABDOMINAL HYSTERECTOMY    . ABLATION ON ENDOMETRIOSIS N/A 06/04/2014   Procedure: ABLATION ON ENDOMETRIOSIS;  Surgeon: Leslie Andrea, MD;  Location: [facility];  Service: Gynecology;  Laterality: N/A;  . BIOPSY  12/01/2011   Procedure: BIOPSY;  Surgeon: Leslie Andrea, MD;   Location: [facility];  Service: Gynecology;  Laterality: N/A;  . CESAREAN SECTION  2008  . CESAREAN SECTION WITH BILATERAL TUBAL LIGATION N/A 12/02/2012   Procedure: Repeat Cesarean Section Delivery Baby Girl @ 1826, Apgars 8/9, Bilateral Tubal Ligation;  Surgeon: Leslie Andrea, MD;  Location: [facility];  Service: Obstetrics;  Laterality: N/A;  . COLPOSCOPY     as teenager, results WNL  . ENDOMETRIAL BIOPSY    . LAPAROSCOPIC ASSISTED VAGINAL HYSTERECTOMY  2012   with bilateral salpingectomy  . LAPAROSCOPIC BILATERAL SALPINGECTOMY Bilateral 02/15/2015   Procedure: LAPAROSCOPIC BILATERAL SALPINGECTOMY;  Surgeon: Hildred Laser, MD;  Location: [facility];  Service: Gynecology;  Laterality: Bilateral;  . LAPAROSCOPIC LYSIS OF ADHESIONS N/A 06/04/2014   Procedure: LAPAROSCOPIC LYSIS OF ADHESIONS;  Surgeon: Leslie Andrea, MD;  Location: [facility];  Service: Gynecology;  Laterality: N/A;  . LAPAROSCOPIC SALPINGO OOPHERECTOMY Right 07/17/2016   Procedure: LAPAROSCOPIC RIGHT SALPINGO OOPHORECTOMY;  Surgeon: Hildred Laser, MD;  Location: [facility];  Service: Gynecology;  Laterality: Right;  . LAPAROSCOPY  12/01/2011   Procedure: LAPAROSCOPY OPERATIVE;  Surgeon: Leslie Andrea, MD;  Location: [facility];  Service: Gynecology;  Laterality: N/A;  . LAPAROSCOPY N/A 06/04/2014   Procedure: LAPAROSCOPY OPERATIVE;  Surgeon: Leslie Andrea, MD;  Location: [facility];  Service: Gynecology;  Laterality: N/A;  . LAPAROSCOPY N/A 02/14/2016   Procedure: LAPAROSCOPY DIAGNOSTIC WITH BIOPSIES POSSIBLE ADHESIOLYSIS;  Surgeon: Hildred Laser, MD;  Location: [facility];  Service: Gynecology;  Laterality: N/A;  . LAPAROSCOPY N/A 05/26/2016   Procedure: LAPAROSCOPY operative with lysis of adhesions;  Surgeon: Linzie Collin, MD;  Location: [facility];  Service: Gynecology;  Laterality: N/A;  . OOPHORECTOMY Left   . SALPINGECTOMY Bilateral   . TONSILLECTOMY    . TONSILLECTOMY    . TUBAL LIGATION    . TYMPANOSTOMY TUBE PLACEMENT      Social History   Occupational History  . 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 Smoking status: Never Smoker  . Smokeless tobacco: Never Used  . Alcohol use Yes     Comment: occasional  . Drug use: No  . Sexual activity: Yes    Birth control/ protection: Surgical, Pill    OBJECTIVE:  VS:  HT:5\' 5"  (165.1 cm)   WT:221 lb (100.2 kg)  BMI:36.78    BP:112/82  HR:70bpm  TEMP: ( )  RESP:96 % EXAM: Findings:  Adult female.  No acute distress.  She is alert and appropriate.  Her low back is overall well aligned but she does have bilateral paraspinal muscle spasms.  She has discomfort with straight leg raise on the left greater than right.  Pain does seem to be worse with palpation of the facets on the left greater than right low lumbar segments.  She has only a small amount of discomfort with extension.  She is able to heel and toe walk without difficulty.  Good internal/external rotation of bilateral hips.     No results found. ASSESSMENT & PLAN:     ICD-10-CM   1. Chronic left-sided low back pain with left-sided sciatica M54.42 MR Lumbar Spine Wo Contrast   G89.29   2. Osteoarthritis of spine with radiculopathy, lumbar region M47.26 MR Lumbar Spine Wo Contrast  ================================================================= Osteoarthritis of spine with radiculopathy, lumbar region Given this is been going on for over 6 months, worsened over the past several weeks and has not been responsive to home exercises, gabapentinoids, corticosteroids, and chiropractic treatment further evaluation of the underlying and had a indicated with MRI at this time.  We will plan to refer her over to Dr. Alvester Morin for lumbar epidural steroid injection as long as the MRI supports this.  We will plan to call her with the results.  We will have her continue with foundations  training ================================================================= Patient Instructions  We are ordering an MRI for you today.  The imaging office will be calling you to schedule your appointment after we obtain authorization from your insurance company.   Please be sure you have signed up for MyChart so that we can get your results to you.  We will be in touch with you as soon as we can.  Please know, it can take up to 3-4 business days for the radiologist and Dr. Berline Chough to have time to review the results and determine the best appropriate action.  If there is something that appears to be surgical or needs a referral to other specialists we will let you know through MyChart or telephone.  Otherwise we will plan to schedule a follow up appointment with Dr. Berline Chough once we have the results.   Also check out [employer]" which is a program developed by Dr. Myles Lipps.   There are links to a couple of his YouTube Videos below and I would like to see performing one of his videos 5-6 days per week.    A good intro video is: "Independence from Pain 7-minute Video" - [email or web address]   His more advanced video is: "Powerful Posture and Pain Relief: 12 minutes of Foundation Training" - [email or web address]  Do not try to attempt this entire video when first beginning.    Try breaking of each exercise that he goes into shorter segments.  Otherwise if they perform an exercise for 45 seconds, start with 15 seconds and rest and then resume when they begin the new activity.    If you work your way up to doing this 12 minute video, I expect you will see significant improvements in your pain.  If you enjoy his videos and would like to find out more you can look on his website: [email or web address].  He has a workout streaming option as well as a DVD set available for purchase.  Amazon has the best price for his DVDs.      =================================================================   Follow-up: Return for MRI review.   CMA/ATC served as Neurosurgeon during this visit. History, Physical, and Plan performed by medical provider. Documentation and orders reviewed and attested to.      Gaspar Bidding, DO    [facility] Phys[facility]

## 2016-12-13 NOTE — Assessment & Plan Note (Signed)
Given this is been going on for over 6 months, worsened over the past several weeks and has not been responsive to home exercises, gabapentinoids, corticosteroids, and chiropractic treatment further evaluation of the underlying and had a indicated with MRI at this time.  We will plan to refer her over to Dr. Alvester MorinNewton for lumbar epidural steroid injection as long as the MRI supports this.  We will plan to call her with the results.  We will have her continue with foundations training

## 2016-12-15 ENCOUNTER — Encounter: Payer: Self-pay | Admitting: Family Medicine

## 2016-12-19 ENCOUNTER — Encounter: Payer: Self-pay | Admitting: Obstetrics and Gynecology

## 2016-12-19 ENCOUNTER — Other Ambulatory Visit: Payer: Self-pay | Admitting: Obstetrics and Gynecology

## 2016-12-20 ENCOUNTER — Other Ambulatory Visit: Payer: Self-pay | Admitting: Obstetrics and Gynecology

## 2016-12-20 MED ORDER — TRAMADOL HCL 50 MG PO TABS: 100.0000 mg | ORAL_TABLET | Freq: Four times a day (QID) | ORAL | 3 refills | Status: DC | PRN

## 2016-12-25 ENCOUNTER — Ambulatory Visit
Admission: RE | Admit: 2016-12-25 | Discharge: 2016-12-25 | Disposition: A | Discharge status: Home / Self Care | Payer: BLUE CROSS/BLUE SHIELD | Source: Ambulatory Visit | Attending: Sports Medicine | Admitting: Sports Medicine

## 2016-12-25 DIAGNOSIS — G8929 Other chronic pain: Secondary | ICD-10-CM

## 2016-12-25 DIAGNOSIS — M4726 Other spondylosis with radiculopathy, lumbar region: Secondary | ICD-10-CM

## 2016-12-25 DIAGNOSIS — M5442 Lumbago with sciatica, left side: Principal | ICD-10-CM

## 2016-12-26 ENCOUNTER — Other Ambulatory Visit: Payer: Self-pay

## 2016-12-26 ENCOUNTER — Telehealth: Payer: Self-pay | Admitting: Sports Medicine

## 2016-12-26 DIAGNOSIS — M4726 Other spondylosis with radiculopathy, lumbar region: Secondary | ICD-10-CM

## 2016-12-26 NOTE — Telephone Encounter (Signed)
See MRI report

## 2016-12-26 NOTE — Telephone Encounter (Signed)
Returning missed call. MRI?  Ty,  -LL

## 2017-01-01 ENCOUNTER — Encounter: Payer: Self-pay | Admitting: Sports Medicine

## 2017-01-02 ENCOUNTER — Other Ambulatory Visit: Payer: Self-pay

## 2017-01-02 MED ORDER — GABAPENTIN 300 MG PO CAPS: ORAL_CAPSULE | ORAL | 1 refills | Status: DC

## 2017-01-08 ENCOUNTER — Other Ambulatory Visit: Payer: Self-pay | Admitting: Internal Medicine

## 2017-01-10 ENCOUNTER — Other Ambulatory Visit: Payer: Self-pay | Admitting: Family Medicine

## 2017-01-10 ENCOUNTER — Encounter: Payer: Self-pay | Admitting: Family Medicine

## 2017-01-10 MED ORDER — LOSARTAN POTASSIUM-HCTZ 100-25 MG PO TABS: 1.0000 | ORAL_TABLET | Freq: Every day | ORAL | 1 refills | Status: DC

## 2017-01-10 NOTE — Telephone Encounter (Signed)
Spoke to pt, clarified what blood pressure medication she needed and pharmacy. Rx sen to pharmacy. Pt verbalized understanding.

## 2017-01-15 ENCOUNTER — Encounter (INDEPENDENT_AMBULATORY_CARE_PROVIDER_SITE_OTHER): Payer: BLUE CROSS/BLUE SHIELD | Admitting: Physical Medicine and Rehabilitation

## 2017-01-23 ENCOUNTER — Other Ambulatory Visit: Payer: Self-pay | Admitting: Obstetrics and Gynecology

## 2017-01-23 ENCOUNTER — Encounter: Payer: Self-pay | Admitting: Obstetrics and Gynecology

## 2017-01-23 MED ORDER — PAROXETINE HCL 10 MG PO TABS: 10.0000 mg | ORAL_TABLET | Freq: Every day | ORAL | 3 refills | Status: DC

## 2017-01-24 ENCOUNTER — Ambulatory Visit (INDEPENDENT_AMBULATORY_CARE_PROVIDER_SITE_OTHER): Payer: Self-pay

## 2017-01-24 ENCOUNTER — Ambulatory Visit (INDEPENDENT_AMBULATORY_CARE_PROVIDER_SITE_OTHER): Payer: BLUE CROSS/BLUE SHIELD | Admitting: Physical Medicine and Rehabilitation

## 2017-01-24 VITALS — BP 140/101 | HR 96 | Ht 65.0 in | Wt 210.0 lb

## 2017-01-24 DIAGNOSIS — M5116 Intervertebral disc disorders with radiculopathy, lumbar region: Secondary | ICD-10-CM | POA: Diagnosis not present

## 2017-01-24 DIAGNOSIS — M5416 Radiculopathy, lumbar region: Secondary | ICD-10-CM

## 2017-01-24 MED ORDER — BETAMETHASONE SOD PHOS & ACET 6 (3-3) MG/ML IJ SUSP
12.0000 mg | Freq: Once | INTRAMUSCULAR | Status: AC
  Administered 2017-01-24: 12 mg

## 2017-01-24 MED ORDER — LIDOCAINE HCL (PF) 1 % IJ SOLN
2.0000 mL | Freq: Once | INTRAMUSCULAR | Status: AC
  Administered 2017-01-24: 2 mL

## 2017-01-24 NOTE — Progress Notes (Deleted)
Intense LBP, into back of legs, and right leg goes numb. Heat wraps, NO bt's or dye allergy

## 2017-01-24 NOTE — Patient Instructions (Signed)

## 2017-01-25 NOTE — Procedures (Signed)
[name] is a very pleasant 36 year old female who is followed by Dr. Berline Chough who [facility] today for epidural injection. Her history is that she's been having chronic contents low back pain really right more than left but bilateral. She reports pain and some numbness and tingling into the right leg. The right leg distribution is somewhat nondermatomal in the fact that it is posterior as well as anterior lateral. She has not had relief with medications and therapy to this point. She has not noted focal weakness but does feel weak. She's been using medications and he wraps. She does have an MRI showing foraminal protrusion on the right at L4 without much in the way of other abnormalities. We are going to complete a diagnostic and hopefully therapeutic right L4 transforaminal epidural steroid injection. If that relieves her right-sided complaints that she still having back pain that I do think if she has not had workup for myofascial pain and trigger points that would be the next step for her back.  Lumbosacral Transforaminal Epidural Steroid Injection - Sub-Pedicular Approach with Fluoroscopic Guidance  Patient: [name]      Date of Birth: April 07, 1981 MRN: [id number] PCP: Emi Belfast, FNP      Visit Date: 01/24/2017   Universal Protocol:    Date/Time: 01/24/2017  Consent Given By: the patient  Position: PRONE  Additional Comments: Vital signs were monitored before and after the procedure. Patient was prepped and draped in the usual sterile fashion. The correct patient, procedure, and site was verified.   Injection Procedure Details:  Procedure Site One Meds Administered:  Meds ordered this encounter  Medications  . lidocaine (PF) (XYLOCAINE) 1 % injection 2 mL  . betamethasone acetate-betamethasone sodium phosphate (CELESTONE) injection 12 mg    Laterality: Right  Location/Site:  L4-L5  Needle size: 22 G  Needle type: Spinal  Needle Placement:  Transforaminal  Findings:  -Contrast Used: 1 mL iohexol 180 mg iodine/mL   -Comments: Excellent flow of contrast along the nerve and into the epidural space.  Procedure Details: After squaring off the end-plates to get a true AP view, the C-arm was positioned so that an oblique view of the foramen as noted above was visualized. The target area is just inferior to the "nose of the scotty dog" or sub pedicular. The soft tissues overlying this structure were infiltrated with 2-3 ml. of 1% Lidocaine without Epinephrine.  The spinal needle was inserted toward the target using a "trajectory" view along the fluoroscope beam.  Under AP and lateral visualization, the needle was advanced so it did not puncture dura and was located close the 6 O'Clock position of the pedical in AP tracterory. Biplanar projections were used to confirm position. Aspiration was confirmed to be negative for CSF and/or blood. A 1-2 ml. volume of Isovue-250 was injected and flow of contrast was noted at each level. Radiographs were obtained for documentation purposes.   After attaining the desired flow of contrast documented above, a 0.5 to 1.0 ml test dose of 0.25% Marcaine was injected into each respective transforaminal space.  The patient was observed for 90 seconds post injection.  After no sensory deficits were reported, and normal lower extremity motor function was noted,   the above injectate was administered so that equal amounts of the injectate were placed at each foramen (level) into the transforaminal epidural space.   Additional Comments:  The patient tolerated the procedure well Dressing: Band-Aid    Post-procedure details: Patient was observed during  the procedure. Post-procedure instructions were reviewed.  Patient left the clinic in stable condition.

## 2017-01-30 ENCOUNTER — Ambulatory Visit: Payer: BLUE CROSS/BLUE SHIELD | Attending: Obstetrics and Gynecology | Admitting: Physical Therapy

## 2017-02-12 ENCOUNTER — Encounter: Payer: Self-pay | Admitting: Family Medicine

## 2017-02-12 NOTE — Telephone Encounter (Signed)
Pt called - she is asking for Eunice BlaseDebbie to read her mychart message at her earliest availability.

## 2017-02-15 ENCOUNTER — Encounter: Payer: Self-pay | Admitting: Obstetrics and Gynecology

## 2017-02-15 ENCOUNTER — Other Ambulatory Visit: Payer: Self-pay | Admitting: Obstetrics and Gynecology

## 2017-02-16 ENCOUNTER — Encounter: Payer: Self-pay | Admitting: Obstetrics and Gynecology

## 2017-04-06 ENCOUNTER — Other Ambulatory Visit: Payer: Self-pay | Admitting: Obstetrics and Gynecology

## 2017-04-06 ENCOUNTER — Telehealth: Payer: Self-pay | Admitting: *Deleted

## 2017-04-06 NOTE — Telephone Encounter (Signed)
Patient called and states she needs a refill of her Ambien . Please advise. Thank you

## 2017-04-06 NOTE — Telephone Encounter (Signed)
Will send to ac.

## 2017-04-09 NOTE — Telephone Encounter (Signed)
Please inform patient that prescription will be available at the front desk.

## 2017-04-11 ENCOUNTER — Encounter: Payer: Self-pay | Admitting: Obstetrics and Gynecology

## 2017-04-12 ENCOUNTER — Encounter: Payer: Self-pay | Admitting: Family Medicine

## 2017-04-12 ENCOUNTER — Other Ambulatory Visit: Payer: Self-pay | Admitting: Obstetrics and Gynecology

## 2017-04-12 MED ORDER — TRAMADOL HCL 50 MG PO TABS: ORAL_TABLET | ORAL | 3 refills | Status: DC

## 2017-04-12 NOTE — Telephone Encounter (Signed)
Pt aware per my chart message.

## 2017-04-12 NOTE — Telephone Encounter (Signed)
I only got the refill request from the pharmacy for the Ambien.  I was unaware of the need for prescription refill for Tramadol.  But I will have it up front for her today.

## 2017-04-25 ENCOUNTER — Encounter: Payer: Self-pay | Admitting: Obstetrics and Gynecology

## 2017-04-25 ENCOUNTER — Encounter (INDEPENDENT_AMBULATORY_CARE_PROVIDER_SITE_OTHER): Payer: Self-pay | Admitting: Physical Medicine and Rehabilitation

## 2017-04-25 NOTE — Telephone Encounter (Signed)
Please have patient schedule an appointment with me to discuss this further.

## 2017-05-01 IMAGING — CT CT ABD-PELV W/ CM
1 of 3 series · 13 of 32 positions shown, 19 images · IV contrast (omnipaque)
Comparison: 04/28/2014 CT abdomen/ pelvis. 08/21/2014 pelvic
sonogram

CLINICAL DATA: Left lower quadrant pain. Reported history of left
salpingo-oophorectomy on 02/15/2015. Prior hysterectomy.

EXAM:
CT ABDOMEN AND PELVIS WITH CONTRAST
TECHNIQUE: Multidetector CT imaging of the abdomen and pelvis was performed
using the standard protocol following bolus administration of
intravenous contrast.
CONTRAST:  100mL OMNIPAQUE IOHEXOL 300 MG/ML  SOLN

[Series 2: routine abd pel with · axial · 0.85mm/px · z∈[-956,-536]mm · 13 of 98 slices shown, 19 images]
[im 7/98  soft-tissue]
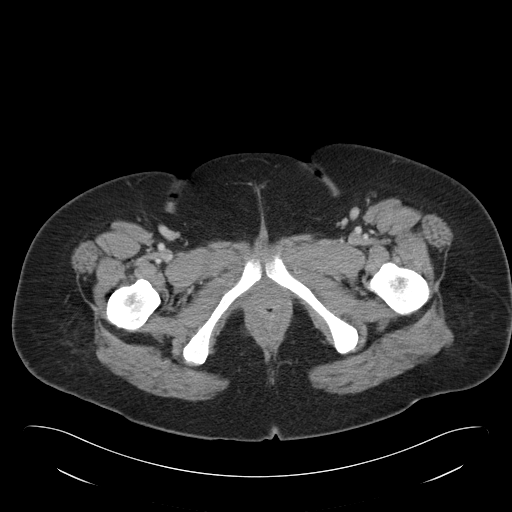
[im 7/98  bone]
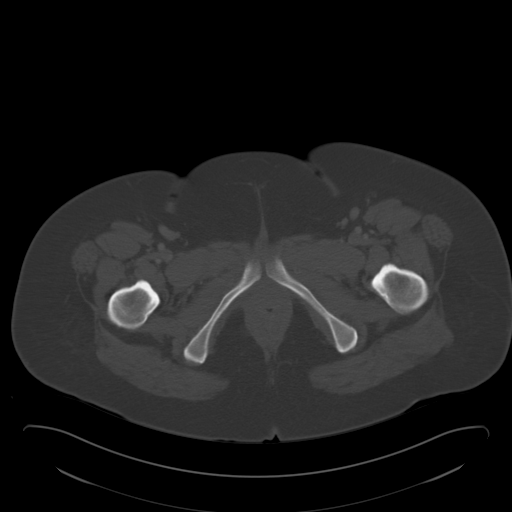
[im 13/98  soft-tissue]
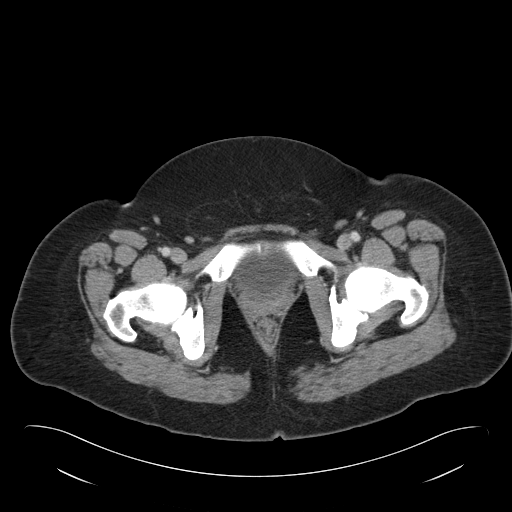
[im 20/98  soft-tissue]
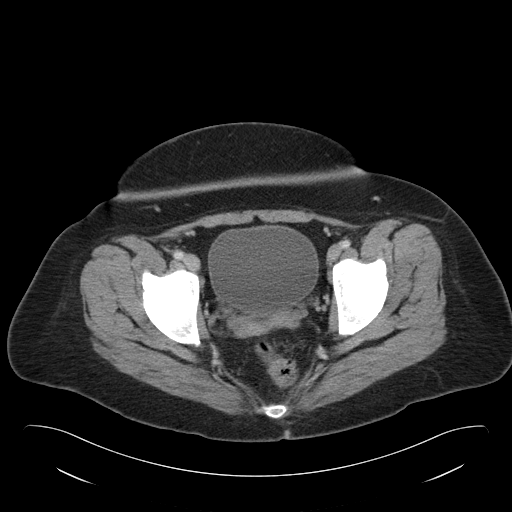
[im 26/98  soft-tissue]
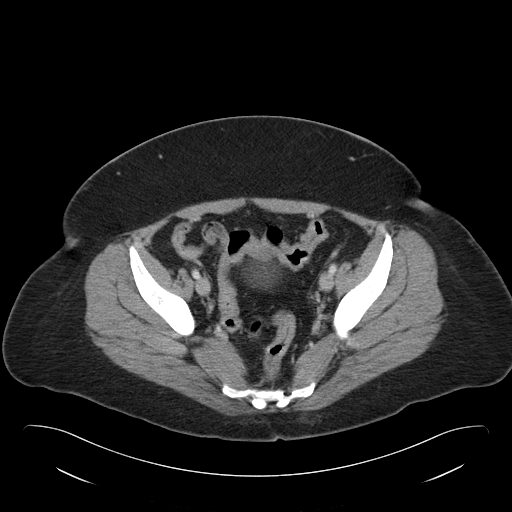
[im 33/98  soft-tissue]
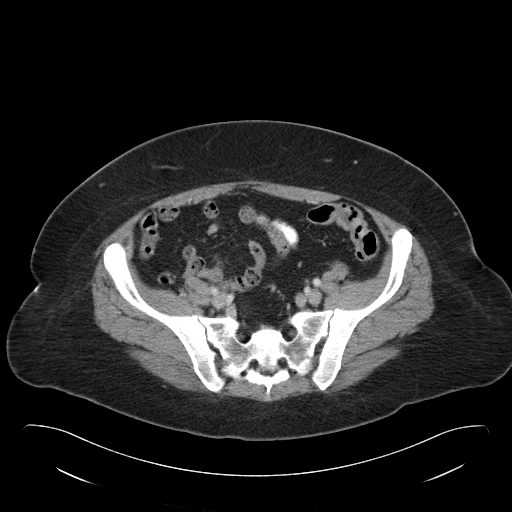
[im 39/98  soft-tissue]
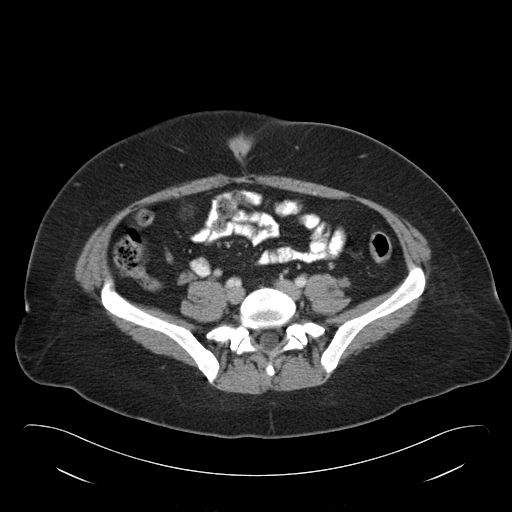
[im 52/98  soft-tissue]
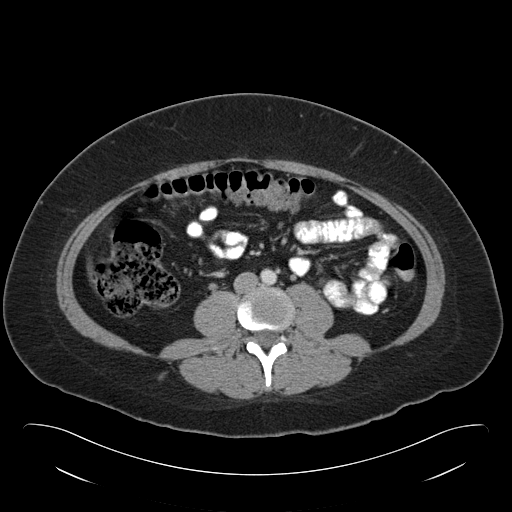
[im 59/98  soft-tissue]
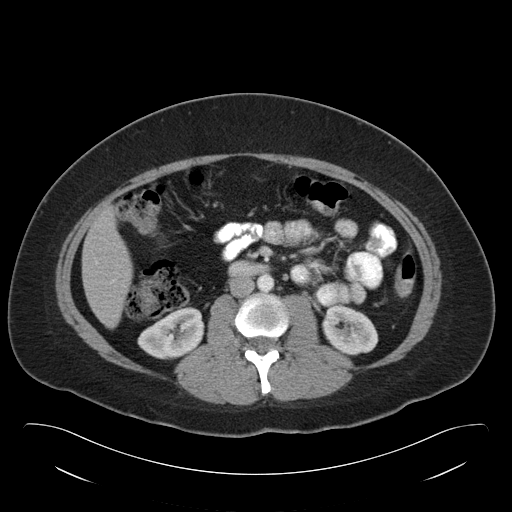
[im 65/98  soft-tissue]
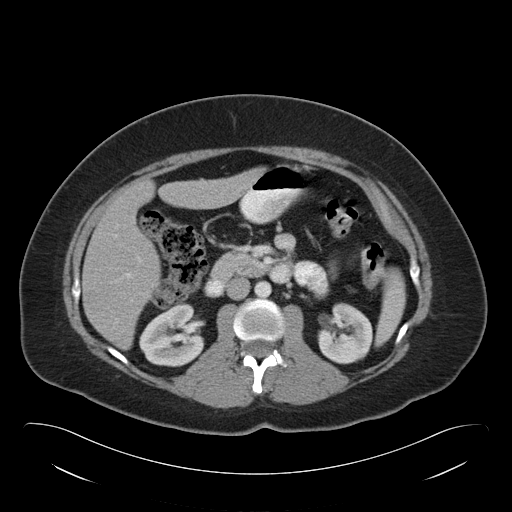
[im 65/98  bone]
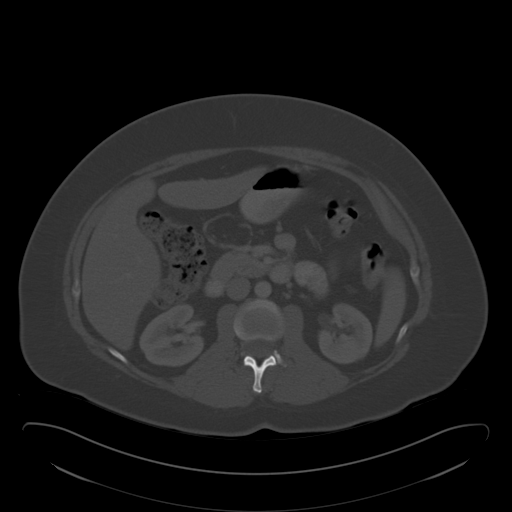
[im 72/98  soft-tissue]
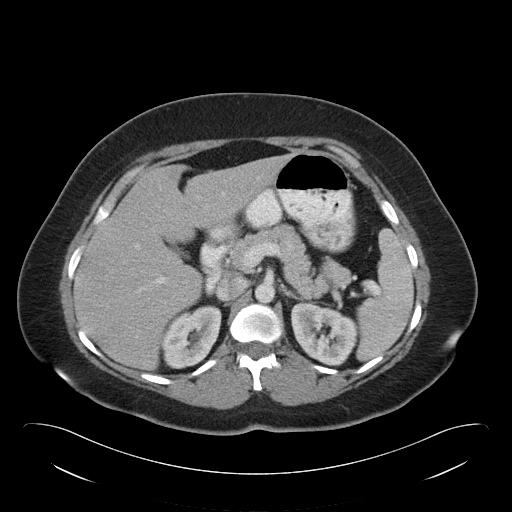
[im 72/98  lung]
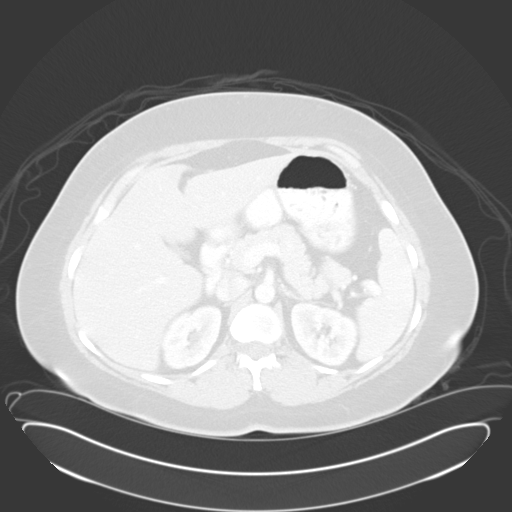
[im 78/98  soft-tissue]
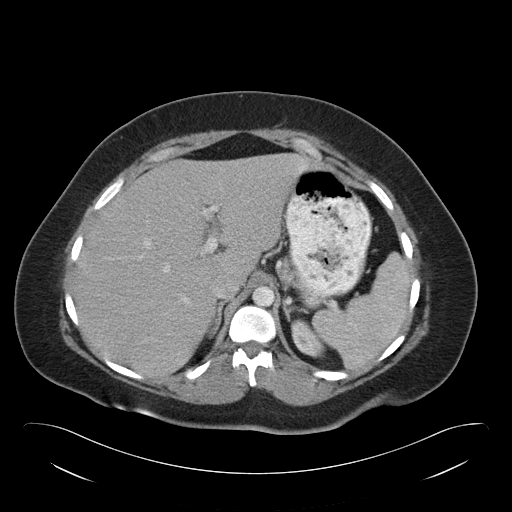
[im 78/98  lung]
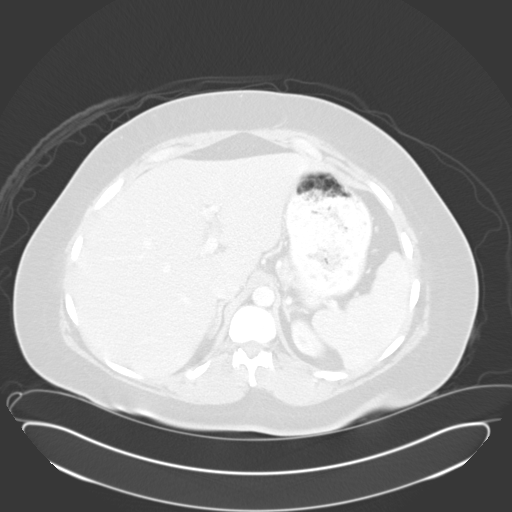
[im 85/98  soft-tissue]
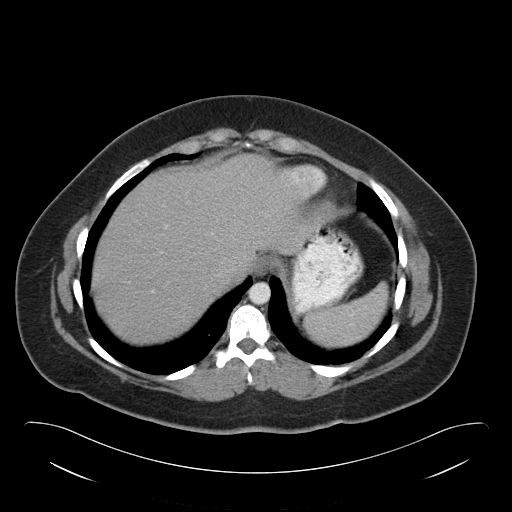
[im 85/98  lung]
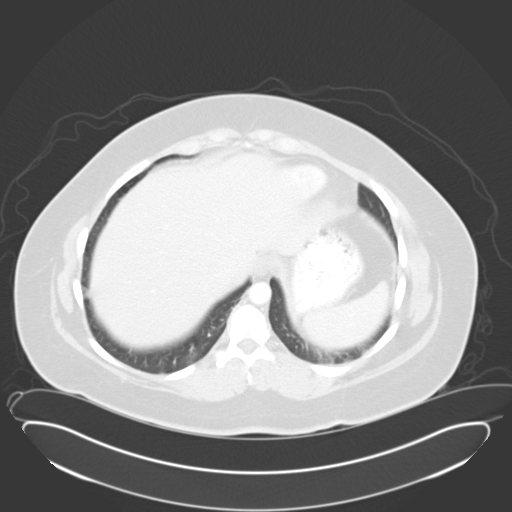
[im 91/98  soft-tissue]
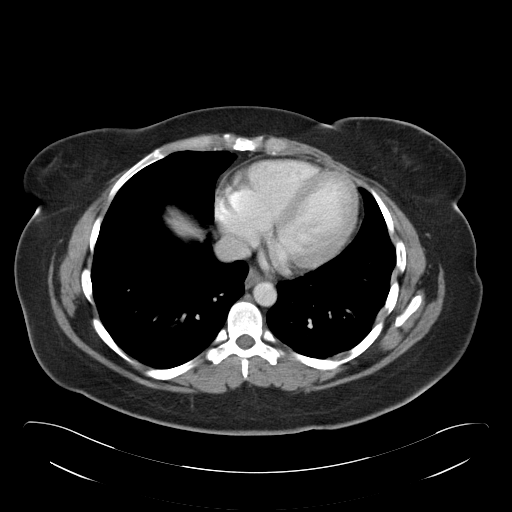
[im 91/98  lung]
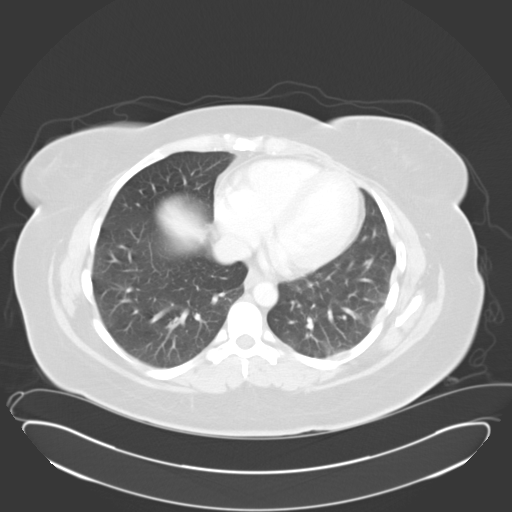

[13 of 32 positions shown; findings below may reference images not displayed]

FINDINGS: Lower chest: No significant pulmonary nodules or acute consolidative
airspace disease.

Hepatobiliary: Normal liver with no liver mass. Normal gallbladder
with no radiopaque cholelithiasis. No biliary ductal dilatation.

Pancreas: Normal, with no mass or duct dilation.

Spleen: Normal size. No mass.

Adrenals/Urinary Tract: Normal adrenals. Normal kidneys with no
hydronephrosis and no renal mass. Normal bladder.

Stomach/Bowel: Grossly normal stomach. Normal caliber small bowel
with no small bowel wall thickening. Normal appendix. Normal large
bowel with no diverticulosis, large bowel wall thickening or
pericolonic fat stranding.

Vascular/Lymphatic: Normal caliber abdominal aorta. Patent portal,
splenic, hepatic and renal veins. No pathologically enlarged lymph
nodes in the abdomen or pelvis.

Reproductive: Status post hysterectomy, with no abnormal findings at
the vaginal cuff. Right ovary appears normal. No adnexal mass or
fluid collection.

Other: No pneumoperitoneum, ascites or focal fluid collection.

Musculoskeletal: No aggressive appearing focal osseous lesions. Mild
subcutaneous fat stranding in the ventral left lower abdominal wall,
with no subcutaneous gas or superficial fluid collection.
IMPRESSION: 1. Mild subcutaneous fat stranding in the ventral left lower
abdominal wall, suggesting mild cellulitis. No subcutaneous gas or
superficial fluid collection.
2. Status post hysterectomy, with no abnormal findings of the
vaginal cuff. No adnexal mass or fluid collection. No pelvic
ascites.

## 2017-05-07 ENCOUNTER — Encounter (INDEPENDENT_AMBULATORY_CARE_PROVIDER_SITE_OTHER): Payer: BLUE CROSS/BLUE SHIELD | Admitting: Physical Medicine and Rehabilitation

## 2017-05-31 ENCOUNTER — Encounter: Payer: Self-pay | Admitting: Obstetrics and Gynecology

## 2017-06-04 ENCOUNTER — Other Ambulatory Visit: Payer: Self-pay | Admitting: Obstetrics and Gynecology

## 2017-06-04 MED ORDER — TRAMADOL HCL 50 MG PO TABS: ORAL_TABLET | ORAL | 3 refills | Status: DC

## 2017-06-06 ENCOUNTER — Ambulatory Visit (INDEPENDENT_AMBULATORY_CARE_PROVIDER_SITE_OTHER): Payer: BLUE CROSS/BLUE SHIELD | Admitting: Obstetrics and Gynecology

## 2017-06-06 ENCOUNTER — Encounter: Payer: Self-pay | Admitting: Obstetrics and Gynecology

## 2017-06-06 VITALS — BP 127/88 | HR 76 | Wt 215.2 lb

## 2017-06-06 DIAGNOSIS — G8929 Other chronic pain: Secondary | ICD-10-CM

## 2017-06-06 DIAGNOSIS — N809 Endometriosis, unspecified: Secondary | ICD-10-CM | POA: Diagnosis not present

## 2017-06-06 DIAGNOSIS — N941 Unspecified dyspareunia: Secondary | ICD-10-CM | POA: Diagnosis not present

## 2017-06-06 DIAGNOSIS — R102 Pelvic and perineal pain: Secondary | ICD-10-CM | POA: Diagnosis not present

## 2017-06-06 DIAGNOSIS — Z23 Encounter for immunization: Secondary | ICD-10-CM | POA: Diagnosis not present

## 2017-06-06 NOTE — Progress Notes (Signed)
Pt is doing well. Pt his here to discuss treatment plans with doctor.

## 2017-06-06 NOTE — Progress Notes (Signed)
    GYNECOLOGY PROGRESS NOTE  Subjective:    Patient ID: [name], female    DOB: 1980-12-27, 37 y.o.   MRN: [id number]  HPI  Patient is a 37 y.o. [id number] female who presents for discussion of further management of chronic pelvic pain and intermittetn dyspareunia secondary to h/o endometriosis and pelvic adhesions.  Patient is s/p supracervical hysterectomy with BSO.  She is currently on Danazol for management of residual symptoms, and takes Tramadol for pain (however occasionally needs to take a Percocet when pain is stronger).  She has tried other treatments in the past, including Lupron, Depot Provera, Aygestin, etc, which offer some relief for several months, and then pain returns. She has also tried non-pharmacologic methods in the past including pelvic floor physical therapy which she notes has helped in the past.   Patient also inquires if she can receive her flu vaccine today as well.   The following portions of the patient's history were reviewed and updated as appropriate: allergies, current medications, past family history, past medical history, past social history, past surgical history and problem list.  Review of Systems Pertinent items noted in HPI and remainder of comprehensive ROS otherwise negative.   Objective:   Blood pressure 127/88, pulse 76, weight 215 lb 3.2 oz (97.6 kg), last menstrual period 10/15/2013. General appearance: alert and no distress  Remainder of exam deferred.    Assessment:   Endometriosis, s/p hysterectomy with BSO Dyspareunia Pelvic pain Flu vaccine need  Plan:   - Discussed new treatment option Or[place]issa with patient.  Discussed benefits vs risks.  She has almost come to the completion of use of Danazol course and could be initiated shortly after.  Patient notes that she is interested and would like to read more about it.  She will contact MD once she is ready to initiate.  - Discussed also resuming pelvic floor physical therapy as  an integrative approach to managing her pain and dyspareunia.  - She has recently received a new prescription for Tramadol.  Discussed that if she is going to continue needing long-term pain management, she may be better suited to be managed by a pain specialist.  The goal is to reduce her use of prescription pain medications if she cannot completely come off the meds. Patient notes understanding. WIll continue to reassess if new treatment is initiated.  - Flu vaccine given.   A total of 15 minutes were spent face-to-face with the patient during this encounter and over half of that time dealt with counseling and coordination of care.   Hildred Laserherry, Merrill Villarruel, MD [facility] 06/06/2017 10:13 PM

## 2017-06-06 NOTE — Patient Instructions (Signed)

## 2017-07-24 ENCOUNTER — Ambulatory Visit: Payer: BLUE CROSS/BLUE SHIELD | Admitting: Family Medicine

## 2017-07-30 ENCOUNTER — Emergency Department
Admission: EM | Admit: 2017-07-30 | Discharge: 2017-07-30 | Disposition: A | Discharge status: Home / Self Care | Payer: BLUE CROSS/BLUE SHIELD | Attending: Emergency Medicine | Admitting: Emergency Medicine

## 2017-07-30 ENCOUNTER — Encounter: Payer: Self-pay | Admitting: Emergency Medicine

## 2017-07-30 ENCOUNTER — Encounter: Payer: Self-pay | Admitting: Family Medicine

## 2017-07-30 ENCOUNTER — Ambulatory Visit: Payer: BLUE CROSS/BLUE SHIELD | Admitting: Family Medicine

## 2017-07-30 ENCOUNTER — Other Ambulatory Visit: Payer: Self-pay

## 2017-07-30 ENCOUNTER — Ambulatory Visit (INDEPENDENT_AMBULATORY_CARE_PROVIDER_SITE_OTHER): Payer: BLUE CROSS/BLUE SHIELD | Admitting: Family Medicine

## 2017-07-30 VITALS — BP 160/118 | HR 96 | Temp 98.2°F | Wt 211.5 lb

## 2017-07-30 DIAGNOSIS — I10 Essential (primary) hypertension: Secondary | ICD-10-CM | POA: Insufficient documentation

## 2017-07-30 DIAGNOSIS — Z79899 Other long term (current) drug therapy: Secondary | ICD-10-CM | POA: Insufficient documentation

## 2017-07-30 DIAGNOSIS — M25551 Pain in right hip: Secondary | ICD-10-CM | POA: Diagnosis not present

## 2017-07-30 DIAGNOSIS — G44209 Tension-type headache, unspecified, not intractable: Secondary | ICD-10-CM

## 2017-07-30 DIAGNOSIS — R51 Headache: Secondary | ICD-10-CM | POA: Insufficient documentation

## 2017-07-30 DIAGNOSIS — R519 Headache, unspecified: Secondary | ICD-10-CM

## 2017-07-30 MED ORDER — KETOROLAC TROMETHAMINE 30 MG/ML IJ SOLN
30.0000 mg | Freq: Once | INTRAMUSCULAR | Status: AC
  Administered 2017-07-30: 30 mg via INTRAVENOUS
  Filled 2017-07-30: qty 1

## 2017-07-30 MED ORDER — AMLODIPINE BESYLATE 5 MG PO TABS
10.0000 mg | ORAL_TABLET | Freq: Once | ORAL | Status: AC
  Administered 2017-07-30: 10 mg via ORAL
  Filled 2017-07-30: qty 2

## 2017-07-30 MED ORDER — SODIUM CHLORIDE 0.9 % IV BOLUS
1000.0000 mL | Freq: Once | INTRAVENOUS | Status: AC
Start: 2017-07-30 — End: 2017-07-30
  Administered 2017-07-30: 1000 mL via INTRAVENOUS

## 2017-07-30 MED ORDER — AMLODIPINE BESYLATE 10 MG PO TABS: 10.0000 mg | ORAL_TABLET | Freq: Every day | ORAL | 3 refills | Status: DC

## 2017-07-30 MED ORDER — METOCLOPRAMIDE HCL 5 MG/ML IJ SOLN
10.0000 mg | Freq: Once | INTRAMUSCULAR | Status: AC
  Administered 2017-07-30: 10 mg via INTRAVENOUS
  Filled 2017-07-30: qty 2

## 2017-07-30 MED ORDER — DIPHENHYDRAMINE HCL 50 MG/ML IJ SOLN
25.0000 mg | Freq: Once | INTRAMUSCULAR | Status: AC
  Administered 2017-07-30: 25 mg via INTRAVENOUS
  Filled 2017-07-30: qty 1

## 2017-07-30 NOTE — ED Notes (Signed)
Pt given snack at this time  

## 2017-07-30 NOTE — ED Triage Notes (Signed)
C/O headache, onset last night. Headache continued today. Checked blood pressure, and blood pressure was elevated.  Seen by PCP this morning and made some changes to BP meds.  Told to go home and rest and see if headache improved.  Headache has not improved.

## 2017-07-30 NOTE — Progress Notes (Signed)
Subjective:    Patient ID: [name], female    DOB: Jul 22, 1980, 37 y.o.   MRN: [id number]  HPI This is a 37 yo female who presents today with elevated blood pressure.  Home readings were 174/129 and 188/129.  She had headache and had slept poorly night before. Woke up this morning with headache at top of head. Has noticed some blurred vision over weekend, not sure if glasses need to be updated.  No cold medication/anithistamines. Has some nasal congestion.  She was confused about her blood pressure medication and has not been taking her amlodipine, she has only been taking her losartan-hydrochlorothiazide 100-25 mg.  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   Right hip pain, notices at end of work day and work week. Has been taking ibuprofen 400 mg 2-3 times a day with some relief. Took 2 last night.  She had an appointment with Dr. Patsy Lageropland for later today but will reschedule this.  Past Medical History:  Diagnosis Date  . Abnormal Pap smear   . Anxiety   . Anxiety   . Cough    HAS COLD. PRODUCTIVE CLEAR. NO FEVER. NO WHEEZING. TO SEE PCP IF WORSENS  . Endometriosis   . HA (headache)    otc med prn  . Hyperlipidemia    diet controlled, no meds  . Hypertension   . PONV (postoperative nausea and vomiting)   . Pregnancy induced hypertension 2008   Past Surgical History:  Procedure Laterality Date  . ABDOMINAL HYSTERECTOMY    . ABLATION ON ENDOMETRIOSIS N/A 06/04/2014   Procedure: ABLATION ON ENDOMETRIOSIS;  Surgeon: Leslie AndreaJames E Tomblin II, MD;  Location: [facility];  Service: Gynecology;  Laterality: N/A;  . BIOPSY  12/01/2011   Procedure: BIOPSY;  Surgeon: Leslie AndreaJames E Tomblin II, MD;  Location: [facility];  Service: Gynecology;  Laterality: N/A;  . CESAREAN SECTION  2008  . CESAREAN SECTION WITH BILATERAL TUBAL LIGATION N/A 12/02/2012   Procedure: Repeat Cesarean Section Delivery Baby Girl @ 1826, Apgars 8/9, Bilateral Tubal  Ligation;  Surgeon: Leslie AndreaJames E Tomblin II, MD;  Location: [facility];  Service: Obstetrics;  Laterality: N/A;  . COLPOSCOPY     as teenager, results WNL  . ENDOMETRIAL BIOPSY    . LAPAROSCOPIC ASSISTED VAGINAL HYSTERECTOMY  2012   with bilateral salpingectomy  . LAPAROSCOPIC BILATERAL SALPINGECTOMY Bilateral 02/15/2015   Procedure: LAPAROSCOPIC BILATERAL SALPINGECTOMY;  Surgeon: Hildred LaserAnika Cherry, MD;  Location: [facility];  Service: Gynecology;  Laterality: Bilateral;  . LAPAROSCOPIC LYSIS OF ADHESIONS N/A 06/04/2014   Procedure: LAPAROSCOPIC LYSIS OF ADHESIONS;  Surgeon: Leslie AndreaJames E Tomblin II, MD;  Location: [facility];  Service: Gynecology;  Laterality: N/A;  . LAPAROSCOPIC SALPINGO OOPHERECTOMY Right 07/17/2016   Procedure: LAPAROSCOPIC RIGHT SALPINGO OOPHORECTOMY;  Surgeon: Hildred LaserAnika Cherry, MD;  Location: [facility];  Service: Gynecology;  Laterality: Right;  . LAPAROSCOPY  12/01/2011   Procedure: LAPAROSCOPY OPERATIVE;  Surgeon: Leslie AndreaJames E Tomblin II, MD;  Location: [facility];  Service: Gynecology;  Laterality: N/A;  . LAPAROSCOPY N/A 06/04/2014   Procedure: LAPAROSCOPY OPERATIVE;  Surgeon: Leslie AndreaJames E Tomblin II, MD;  Location: [facility];  Service: Gynecology;  Laterality: N/A;  . LAPAROSCOPY N/A 02/14/2016   Procedure: LAPAROSCOPY DIAGNOSTIC WITH BIOPSIES POSSIBLE ADHESIOLYSIS;  Surgeon: Hildred LaserAnika Cherry, MD;  Location: [facility];  Service: Gynecology;  Laterality: N/A;  . LAPAROSCOPY N/A 05/26/2016   Procedure: LAPAROSCOPY operative with lysis of adhesions;  Surgeon: Onalee Huaavid  Charlton Haws, MD;  Location: [facility];  Service: Gynecology;  Laterality: N/A;  . OOPHORECTOMY Left   . SALPINGECTOMY Bilateral   . TONSILLECTOMY    . TONSILLECTOMY    . TUBAL LIGATION    . TYMPANOSTOMY TUBE PLACEMENT     Family History  Problem Relation Age of Onset  . Hypertension Father   . Coronary artery disease Paternal Uncle   . Cancer Paternal Grandfather   . Hypertension Mother   . Breast cancer Paternal Aunt   . Ovarian cancer Maternal  Grandmother   . Diabetes Neg Hx    Social History   Tobacco Use  . Smoking status: Never Smoker  . Smokeless tobacco: Never Used  Substance Use Topics  . Alcohol use: No    Frequency: Never    Comment: occasional  . Drug use: No      Review of Systems    Per HPI Objective:   Physical Exam  Constitutional: She is oriented to person, place, and time. She appears well-developed and well-nourished. No distress.  HENT:  Head: Normocephalic and atraumatic.  Sounds congested.   Eyes: Conjunctivae are normal.  Neck: Normal range of motion. Neck supple.  Cardiovascular: Normal rate.  Pulmonary/Chest: Effort normal.  Musculoskeletal: Normal range of motion.  Neurological: She is alert and oriented to person, place, and time.  Skin: Skin is warm and dry. She is not diaphoretic.  Psychiatric: She has a normal mood and affect. Her behavior is normal. Judgment and thought content normal.  Vitals reviewed.     BP (!) 186/124 (BP Location: Right Arm, Patient Position: Sitting, Cuff Size: Normal)   Pulse 96   Temp 98.2 F (36.8 C) (Oral)   Wt 211 lb 8 oz (95.9 kg)   LMP 10/15/2013   SpO2 98%   BMI 35.20 kg/m  Wt Readings from Last 3 Encounters:  07/30/17 211 lb 8 oz (95.9 kg)  06/06/17 215 lb 3.2 oz (97.6 kg)  01/24/17 210 lb (95.3 kg)   BP Readings from Last 3 Encounters:  07/30/17 (!) 186/124  06/06/17 127/88  01/24/17 (!) 140/101   Recheck blood pressure- 160/118 BP: (!) 160/118        Assessment & Plan:  1. Essential (primary) hypertension - discussed her medications for HTN and encouraged her to get her prescription filled today to resume amlodipine - amLODipine (NORVASC) 10 MG tablet; Take 1 tablet (10 mg total) by mouth daily.  Dispense: 90 tablet; Refill: 3 -she was instructed to check her blood pressure at home and send me readings -Follow-up in 2 weeks, sooner if worsening symptoms  2. Acute non intractable tension-type headache - Acetaminophen, avoid  NSAIDs currently, rest, hydrate  3. Right hip pain - discussed importance of supportive footwear, she will reschedule with Dr. Hollace Kinnier, FNP-BC  [facility], [facility]  07/31/2017 8:27 AM

## 2017-07-30 NOTE — ED Provider Notes (Signed)
[facility] Emergency Department Provider Note ____________________________________________   First MD Initiated Contact with Patient 07/30/17 1754     (approximate)  I have reviewed the triage vital signs and the nursing notes.   HISTORY  Chief Complaint Headache    HPI [name] is a 37 y.o. female with past medical history as noted below who presents with headache for the last day, gradual onset last night, bitemporal, and described as throbbing.  She states it has persisted throughout the day today.  She states she has had a headache similar to this multiple times previously.  She went to her doctor earlier today, was evaluated, was noted to be significantly hypertensive, took her blood pressure medication, and was told to go home and try to rest.  The patient states that after sleeping for a while she woke up and did not feel any better.  She states that she did not take her amlodipine today.   Past Medical History:  Diagnosis Date  . Abnormal Pap smear   . Anxiety   . Anxiety   . Cough    HAS COLD. PRODUCTIVE CLEAR. NO FEVER. NO WHEEZING. TO SEE PCP IF WORSENS  . Endometriosis   . HA (headache)    otc med prn  . Hyperlipidemia    diet controlled, no meds  . Hypertension   . PONV (postoperative nausea and vomiting)   . Pregnancy induced hypertension 2008    Patient Active Problem List   Diagnosis Date Noted  . Osteoarthritis of spine with radiculopathy, lumbar region 06/20/2016  . Strep pharyngitis 03/30/2016  . H/O abdominal surgery 02/09/2016  . Viral upper respiratory infection 08/20/2015  . Chronic pelvic pain in female 11/30/2014  . Endometriosis 11/30/2014  . Anxiety 09/01/2014  . Essential (primary) hypertension 09/01/2014  . Cyst of ovary 09/01/2014  . Class 2 obesity 09/01/2014  . Headache 07/22/2014  . Abdominal cramping 12/11/2013  . Anxiety state, unspecified 12/11/2013  . Routine general medical examination at a  health care facility 04/06/2011  . Metabolic syndrome 04/06/2011  . Insomnia 12/20/2007  . ALLERGIC RHINITIS 07/18/2007  . HYPERLIPIDEMIA 02/19/2007    Past Surgical History:  Procedure Laterality Date  . ABDOMINAL HYSTERECTOMY    . ABLATION ON ENDOMETRIOSIS N/A 06/04/2014   Procedure: ABLATION ON ENDOMETRIOSIS;  Surgeon: Leslie AndreaJames E Tomblin II, MD;  Location: [facility];  Service: Gynecology;  Laterality: N/A;  . BIOPSY  12/01/2011   Procedure: BIOPSY;  Surgeon: Leslie AndreaJames E Tomblin II, MD;  Location: [facility];  Service: Gynecology;  Laterality: N/A;  . CESAREAN SECTION  2008  . CESAREAN SECTION WITH BILATERAL TUBAL LIGATION N/A 12/02/2012   Procedure: Repeat Cesarean Section Delivery Baby Girl @ 1826, Apgars 8/9, Bilateral Tubal Ligation;  Surgeon: Leslie AndreaJames E Tomblin II, MD;  Location: [facility];  Service: Obstetrics;  Laterality: N/A;  . COLPOSCOPY     as teenager, results WNL  . ENDOMETRIAL BIOPSY    . LAPAROSCOPIC ASSISTED VAGINAL HYSTERECTOMY  2012   with bilateral salpingectomy  . LAPAROSCOPIC BILATERAL SALPINGECTOMY Bilateral 02/15/2015   Procedure: LAPAROSCOPIC BILATERAL SALPINGECTOMY;  Surgeon: Hildred LaserAnika Cherry, MD;  Location: [facility];  Service: Gynecology;  Laterality: Bilateral;  . LAPAROSCOPIC LYSIS OF ADHESIONS N/A 06/04/2014   Procedure: LAPAROSCOPIC LYSIS OF ADHESIONS;  Surgeon: Leslie AndreaJames E Tomblin II, MD;  Location: [facility];  Service: Gynecology;  Laterality: N/A;  . LAPAROSCOPIC SALPINGO OOPHERECTOMY Right 07/17/2016   Procedure: LAPAROSCOPIC RIGHT SALPINGO OOPHORECTOMY;  Surgeon: Hildred LaserAnika Cherry, MD;  Location:  [facility];  Service: Gynecology;  Laterality: Right;  . LAPAROSCOPY  12/01/2011   Procedure: LAPAROSCOPY OPERATIVE;  Surgeon: Leslie Andrea, MD;  Location: [facility];  Service: Gynecology;  Laterality: N/A;  . LAPAROSCOPY N/A 06/04/2014   Procedure: LAPAROSCOPY OPERATIVE;  Surgeon: Leslie Andrea, MD;  Location: [facility];  Service: Gynecology;  Laterality: N/A;  . LAPAROSCOPY N/A 02/14/2016    Procedure: LAPAROSCOPY DIAGNOSTIC WITH BIOPSIES POSSIBLE ADHESIOLYSIS;  Surgeon: Hildred Laser, MD;  Location: [facility];  Service: Gynecology;  Laterality: N/A;  . LAPAROSCOPY N/A 05/26/2016   Procedure: LAPAROSCOPY operative with lysis of adhesions;  Surgeon: Linzie Collin, MD;  Location: [facility];  Service: Gynecology;  Laterality: N/A;  . OOPHORECTOMY Left   . SALPINGECTOMY Bilateral   . TONSILLECTOMY    . TONSILLECTOMY    . TUBAL LIGATION    . TYMPANOSTOMY TUBE PLACEMENT      Prior to Admission medications   Medication Sig Start Date End Date Taking? Authorizing Provider  amLODipine (NORVASC) 10 MG tablet Take 1 tablet (10 mg total) by mouth daily. 07/30/17   Emi Belfast, FNP  danazol (DANOCRINE) 200 MG capsule Take 2 capsules (400 mg total) by mouth 2 (two) times daily. Take for 2 weeks each month. 11/16/16   Hildred Laser, MD  losartan-hydrochlorothiazide (HYZAAR) 100-25 MG tablet Take 1 tablet by mouth daily. 01/10/17   Emi Belfast, FNP  PARoxetine (PAXIL) 10 MG tablet Take 1 tablet (10 mg total) by mouth daily. 01/23/17   Hildred Laser, MD  zolpidem (AMBIEN) 5 MG tablet take 1 tablet by mouth at bedtime if needed for sleep 04/09/17   Hildred Laser, MD    Allergies Sulfamethoxazole-trimethoprim; Amoxil [amoxicillin]; Azithromycin; Cefprozil; Ceftin [cefuroxime axetil]; Other; Penicillins; and Sulfa antibiotics  Family History  Problem Relation Age of Onset  . Hypertension Father   . Coronary artery disease Paternal Uncle   . Cancer Paternal Grandfather   . Hypertension Mother   . Breast cancer Paternal Aunt   . Ovarian cancer Maternal Grandmother   . Diabetes Neg Hx     Social History Social History   Tobacco Use  . Smoking status: Never Smoker  . Smokeless tobacco: Never Used  Substance Use Topics  . Alcohol use: No    Frequency: Never    Comment: occasional  . Drug use: No    Review of Systems  Constitutional: No fever/chills. Eyes: No visual  changes. ENT: No neck pain. Cardiovascular: Denies chest pain. Respiratory: Denies shortness of breath. Gastrointestinal: No vomiting.  Genitourinary: Negative for flank pain.  Musculoskeletal: Negative for back pain. Skin: Negative for rash. Neurological: Positive for headache.   ____________________________________________   PHYSICAL EXAM:  VITAL SIGNS: ED Triage Vitals  Enc Vitals Group     BP 07/30/17 1735 (!) 208/137     Pulse Rate 07/30/17 1735 (!) 102     Resp 07/30/17 1735 16     Temp 07/30/17 1735 98.5 F (36.9 C)     Temp Source 07/30/17 1735 Oral     SpO2 07/30/17 1735 98 %     Weight 07/30/17 1736 211 lb (95.7 kg)     Height --      Head Circumference --      Peak Flow --      Pain Score 07/30/17 1735 8     Pain Loc --      Pain Edu? --      Excl. in GC? --  Constitutional: Alert and oriented.  Relatively well appearing and in no acute distress. Eyes: Conjunctivae are normal.  EOMI.  PERRLA. Head: Atraumatic. Nose: No congestion/rhinnorhea. Mouth/Throat: Mucous membranes are moist.   Neck: Normal range of motion.  Cardiovascular: Normal rate, regular rhythm. Grossly normal heart sounds.  Good peripheral circulation. Respiratory: Normal respiratory effort.  No retractions. Lungs CTAB. Gastrointestinal: No distention.  Musculoskeletal:  Extremities warm and well perfused.  Neurologic: Cranial nerves III through XII intact.  Normal coordination and finger to nose.  Motor intact in all extremities.  Normal speech and language. No gross focal neurologic deficits are appreciated.  Skin:  Skin is warm and dry. No rash noted. Psychiatric: Mood and affect are normal. Speech and behavior are normal.  ____________________________________________   LABS (all labs ordered are listed, but only abnormal results are displayed)  Labs Reviewed - No data to  display ____________________________________________  EKG   ____________________________________________  RADIOLOGY    ____________________________________________   PROCEDURES  Procedure(s) performed: No  Procedures  Critical Care performed: No ____________________________________________   INITIAL IMPRESSION / ASSESSMENT AND PLAN / ED COURSE  Pertinent labs & imaging results that were available during my care of the patient were reviewed by me and considered in my medical decision making (see chart for details).  37 year old female with PMH as noted above presents with headache since last night which is similar to headaches she has had previously, as well as elevated blood pressure.  Patient states that a few months ago her doctor changed her blood pressure regimen and his not been as well-controlled since then.  She has not yet taken her amlodipine for the day.  On exam, vital signs are normal except for the hypertension, the patient is relatively well-appearing, neuro exam is normal, and the remainder the exam is unremarkable.  Overall headache is consistent with tension versus migraine or other benign cause.  There are no neurologic deficits, any evidence of subarachnoid hemorrhage, or hypertensive encephalopathy.  No clinical evidence for any other endorgan dysfunction.  I suspect the elevated blood pressure may in fact be secondary to the headache.  No indication for lab workup or imaging at this time.  Plan: Symptomatic treatment for headache with Reglan and Benadryl, p.o. amlodipine for the hypertension, and reassess.    ----------------------------------------- 8:45 PM on 07/30/2017 -----------------------------------------  Patient had minimal relief after the initial Reglan, however after second dose of Reglan and Toradol, she states that the headache is significantly improved and she would like to go home.  Blood pressure is also significantly improved.  The  patient has plans to follow-up with her primary care doctor.  I instructed her to continue her usual medications.  Return precautions given, and she expresses understanding.  ____________________________________________   FINAL CLINICAL IMPRESSION(S) / ED DIAGNOSES  Final diagnoses:  Hypertension, unspecified type  Acute nonintractable headache, unspecified headache type      NEW MEDICATIONS STARTED DURING THIS VISIT:  New Prescriptions   No medications on file     Note:  This document was prepared using Dragon voice recognition software and may include unintentional dictation errors.    Dionne Bucy, MD 07/30/17 2046

## 2017-07-30 NOTE — Discharge Instructions (Addendum)
Take your regular blood pressure medications as prescribed.  Return to the ER for new, worsening, or persistent severe headache, persistent severely elevated blood pressures, chest pain, difficulty breathing, weakness, or any other new or worsening symptoms that concern you.  Follow-up with your regular doctor.

## 2017-07-30 NOTE — Patient Instructions (Addendum)
Can take over the counter antihistamines for allergy- cetirizine, loratadine (generic)  Please take your blood pressure a couple of times a week and send me your readings via mychart  Please hydrate today until your urine is light yellow   Follow up in 2 weeks

## 2017-07-31 ENCOUNTER — Telehealth: Payer: Self-pay | Admitting: Family Medicine

## 2017-07-31 ENCOUNTER — Encounter: Payer: Self-pay | Admitting: Family Medicine

## 2017-07-31 NOTE — Telephone Encounter (Signed)
Patient seen in ER last night, please call her and see how she is feeling today and if she was able to start her amlodipine?

## 2017-08-01 NOTE — Telephone Encounter (Signed)
Called and spoke with patient she states that when got home her head was pounding. She rechecked her BP and it was still elevated they gave her a dose of amlodipine at the hospital. She is now taking both BP medications first thing in the morning. She states that today she is feeling much better and will call and make an appointment if anything changes. Nothing further needed at this time.

## 2017-08-06 ENCOUNTER — Encounter: Payer: Self-pay | Admitting: Obstetrics and Gynecology

## 2017-08-06 ENCOUNTER — Other Ambulatory Visit: Payer: Self-pay | Admitting: Obstetrics and Gynecology

## 2017-08-06 NOTE — Telephone Encounter (Signed)
We can prescribe 200 mg daily of the Orlissa. She can take this for up to 6 months. We can also refill her Tramadol (see prior prescriptions).

## 2017-08-08 ENCOUNTER — Other Ambulatory Visit: Payer: Self-pay

## 2017-08-08 ENCOUNTER — Encounter: Payer: Self-pay | Admitting: Family Medicine

## 2017-08-08 MED ORDER — TRAMADOL HCL 50 MG PO TABS: 100.0000 mg | ORAL_TABLET | Freq: Four times a day (QID) | ORAL | 0 refills | Status: DC | PRN

## 2017-08-08 MED ORDER — ELAGOLIX SODIUM 200 MG PO TABS: 200.0000 mg | ORAL_TABLET | Freq: Every day | ORAL | 2 refills | Status: DC | PRN

## 2017-08-14 ENCOUNTER — Other Ambulatory Visit: Payer: Self-pay

## 2017-08-30 ENCOUNTER — Other Ambulatory Visit: Payer: Self-pay | Admitting: Obstetrics and Gynecology

## 2017-08-30 ENCOUNTER — Encounter: Payer: Self-pay | Admitting: Obstetrics and Gynecology

## 2017-08-30 MED ORDER — TRAMADOL HCL 50 MG PO TABS: 100.0000 mg | ORAL_TABLET | Freq: Four times a day (QID) | ORAL | 0 refills | Status: DC | PRN

## 2017-08-31 ENCOUNTER — Other Ambulatory Visit: Payer: Self-pay

## 2017-08-31 MED ORDER — TRAMADOL HCL 50 MG PO TABS: 100.0000 mg | ORAL_TABLET | Freq: Four times a day (QID) | ORAL | 0 refills | Status: DC | PRN

## 2017-08-31 NOTE — Telephone Encounter (Signed)
Pt was called and informed that her scribe was available and will be at the front desk for pick up.

## 2017-09-18 ENCOUNTER — Encounter: Payer: Self-pay | Admitting: Sports Medicine

## 2017-09-18 ENCOUNTER — Other Ambulatory Visit: Payer: Self-pay | Admitting: Sports Medicine

## 2017-09-24 ENCOUNTER — Encounter: Payer: Self-pay | Admitting: Family Medicine

## 2017-10-08 ENCOUNTER — Other Ambulatory Visit: Payer: Self-pay | Admitting: Obstetrics and Gynecology

## 2017-10-09 ENCOUNTER — Other Ambulatory Visit: Payer: Self-pay | Admitting: Obstetrics and Gynecology

## 2017-10-09 ENCOUNTER — Encounter: Payer: Self-pay | Admitting: Obstetrics and Gynecology

## 2017-10-09 MED ORDER — TRAMADOL HCL 50 MG PO TABS: 100.0000 mg | ORAL_TABLET | Freq: Four times a day (QID) | ORAL | 0 refills | Status: DC | PRN

## 2017-10-10 ENCOUNTER — Ambulatory Visit: Payer: BLUE CROSS/BLUE SHIELD | Attending: Otolaryngology

## 2017-10-29 ENCOUNTER — Encounter: Payer: Self-pay | Admitting: Obstetrics and Gynecology

## 2017-10-31 ENCOUNTER — Other Ambulatory Visit: Payer: Self-pay | Admitting: Obstetrics and Gynecology

## 2017-10-31 MED ORDER — OXYCODONE-ACETAMINOPHEN 5-325 MG PO TABS: 1.0000 | ORAL_TABLET | Freq: Four times a day (QID) | ORAL | 0 refills | Status: DC | PRN

## 2017-11-05 ENCOUNTER — Other Ambulatory Visit: Payer: Self-pay | Admitting: Obstetrics and Gynecology

## 2017-11-12 ENCOUNTER — Encounter: Payer: Self-pay | Admitting: Obstetrics and Gynecology

## 2017-11-12 ENCOUNTER — Ambulatory Visit (INDEPENDENT_AMBULATORY_CARE_PROVIDER_SITE_OTHER): Payer: BLUE CROSS/BLUE SHIELD | Admitting: Obstetrics and Gynecology

## 2017-11-12 VITALS — BP 137/83 | HR 77 | Ht 65.0 in | Wt 223.5 lb

## 2017-11-12 DIAGNOSIS — G8929 Other chronic pain: Secondary | ICD-10-CM | POA: Diagnosis not present

## 2017-11-12 DIAGNOSIS — R102 Pelvic and perineal pain: Secondary | ICD-10-CM | POA: Diagnosis not present

## 2017-11-12 DIAGNOSIS — Z8742 Personal history of other diseases of the female genital tract: Secondary | ICD-10-CM

## 2017-11-12 MED ORDER — TRAMADOL HCL 50 MG PO TABS: 100.0000 mg | ORAL_TABLET | Freq: Four times a day (QID) | ORAL | 0 refills | Status: DC | PRN

## 2017-11-12 MED ORDER — NORETHINDRONE ACETATE 5 MG PO TABS: 5.0000 mg | ORAL_TABLET | Freq: Every day | ORAL | 6 refills | Status: DC

## 2017-11-12 NOTE — Progress Notes (Signed)
    GYNECOLOGY PROGRESS NOTE  Subjective:    Patient ID: [name], female    DOB: 02-12-1981, 37 y.o.   MRN: [id number]  HPI  Patient is a 37 y.o. [id number] female who presents for f/u of chronic pelvic pain. Patient has a h/o endometriosis, is s/p hysterectomy with BSO.  Currently uses Ultram for pain management however recently had an episode of significant intermittetn pelvic pain lasting for several weeks that was not controlled with Ultram and had to be prescribed Percocet.  Patient states that was taking Orlissa for her endometriosis, however only took x 1 month as she felt like it was making her pelvic pain worse, as well as she began having more severe hot flushes that could not be managed with her Paxil.   The following portions of the patient's history were reviewed and updated as appropriate: allergies, current medications, past family history, past medical history, past social history, past surgical history and problem list.  Review of Systems Pertinent items noted in HPI and remainder of comprehensive ROS otherwise negative.   Objective:   Blood pressure 137/83, pulse 77, height 5\' 5"  (1.651 m), weight 223 lb 8 oz (101.4 kg), last menstrual period 10/15/2013. General appearance: alert and no distress Exam deferred today.    Assessment:   Chronic pelvic pain H/o endometriosis  Plan:   - Patient has been on multiple regimens for management of her endometriosis and pain in the past including OCPs, progesterone-only methods (Depo Provera, Aygestin), Lupron, Danazol, and now Orlissa. Now that patient has had both ovaries removed, discussed that management options were limited as they often act on the HPA axis. Discussed that at this time, the only 2 medications that could offer some relief would be another trial of the Danazol or progesterone (Aygestin). Also recommend referral to pelvic floor physical therapy again to assist in management of pain.  Discussed again with  patient that if long-term pain management was going to be required that it may be best to seek care from pain management clinic.  Patient desires to try another trial of Aygestin. Notes that she had to discontinue in the past due to dyspareunia and vaginal dryness, however this has improved.  - Will reassess symptoms after patient has initiated pelvic floor physical therapy.  Refill for Tramadol given.  - To f/u in 3 months for annual exam and reassess pelvic pain after initiation of physical therapy and Aygestin.     A total of 15 minutes were spent face-to-face with the patient during this encounter and over half of that time dealt with counseling and coordination of care.   Hildred Laserherry, Kista Robb, MD [facility]

## 2017-11-12 NOTE — Progress Notes (Signed)
Pt stated that she is still having pain from endometriosis issues.

## 2017-11-27 ENCOUNTER — Other Ambulatory Visit: Payer: Self-pay

## 2017-11-30 ENCOUNTER — Other Ambulatory Visit: Payer: Self-pay | Admitting: Obstetrics and Gynecology

## 2017-11-30 ENCOUNTER — Ambulatory Visit: Payer: BLUE CROSS/BLUE SHIELD

## 2017-11-30 MED ORDER — TRAMADOL HCL 50 MG PO TABS: 100.0000 mg | ORAL_TABLET | Freq: Four times a day (QID) | ORAL | 1 refills | Status: DC | PRN

## 2017-12-10 ENCOUNTER — Ambulatory Visit: Payer: BLUE CROSS/BLUE SHIELD | Attending: Obstetrics and Gynecology

## 2017-12-10 ENCOUNTER — Encounter: Payer: Self-pay | Admitting: Family Medicine

## 2017-12-11 ENCOUNTER — Ambulatory Visit (INDEPENDENT_AMBULATORY_CARE_PROVIDER_SITE_OTHER): Payer: BLUE CROSS/BLUE SHIELD | Admitting: Internal Medicine

## 2017-12-11 ENCOUNTER — Encounter: Payer: Self-pay | Admitting: Internal Medicine

## 2017-12-11 VITALS — BP 128/86 | HR 76 | Temp 98.1°F | Wt 217.0 lb

## 2017-12-11 DIAGNOSIS — B9789 Other viral agents as the cause of diseases classified elsewhere: Secondary | ICD-10-CM

## 2017-12-11 DIAGNOSIS — J069 Acute upper respiratory infection, unspecified: Secondary | ICD-10-CM | POA: Diagnosis not present

## 2017-12-11 MED ORDER — HYDROCODONE-HOMATROPINE 5-1.5 MG/5ML PO SYRP: 5.0000 mL | ORAL_SOLUTION | Freq: Three times a day (TID) | ORAL | 0 refills | Status: DC | PRN

## 2017-12-11 MED ORDER — METHYLPREDNISOLONE ACETATE 80 MG/ML IJ SUSP
80.0000 mg | Freq: Once | INTRAMUSCULAR | Status: AC
  Administered 2017-12-11: 80 mg via INTRAMUSCULAR

## 2017-12-11 NOTE — Patient Instructions (Signed)
Upper Respiratory Infection, Adult Most upper respiratory infections (URIs) are caused by a virus. A URI affects the nose, throat, and upper air passages. The most common type of URI is often called "the common cold." Follow these instructions at home:  Take medicines only as told by your doctor.  Gargle warm saltwater or take cough drops to comfort your throat as told by your doctor.  Use a warm mist humidifier or inhale steam from a shower to increase air moisture. This may make it easier to breathe.  Drink enough fluid to keep your pee (urine) clear or pale yellow.  Eat soups and other clear broths.  Have a healthy diet.  Rest as needed.  Go back to work when your fever is gone or your doctor says it is okay. ? You may need to stay home longer to avoid giving your URI to others. ? You can also wear a face mask and wash your hands often to prevent spread of the virus.  Use your inhaler more if you have asthma.  Do not use any tobacco products, including cigarettes, chewing tobacco, or electronic cigarettes. If you need help quitting, ask your doctor. Contact a doctor if:  You are getting worse, not better.  Your symptoms are not helped by medicine.  You have chills.  You are getting more short of breath.  You have brown or red mucus.  You have yellow or brown discharge from your nose.  You have pain in your face, especially when you bend forward.  You have a fever.  You have puffy (swollen) neck glands.  You have pain while swallowing.  You have white areas in the back of your throat. Get help right away if:  You have very bad or constant: ? Headache. ? Ear pain. ? Pain in your forehead, behind your eyes, and over your cheekbones (sinus pain). ? Chest pain.  You have long-lasting (chronic) lung disease and any of the following: ? Wheezing. ? Long-lasting cough. ? Coughing up blood. ? A change in your usual mucus.  You have a stiff neck.  You have  changes in your: ? Vision. ? Hearing. ? Thinking. ? Mood. This information is not intended to replace advice given to you by your health care provider. Make sure you discuss any questions you have with your health care provider. Document Released: 09/20/2007 Document Revised: 12/05/2015 Document Reviewed: 07/09/2013 Elsevier Interactive Patient Education  2018 [employer]  

## 2017-12-11 NOTE — Progress Notes (Signed)
HPI  Pt presents to the clinic today with c/o nasal congestion, sore throat and cough. This started 1 week ago. She is blowing yellow/clear mucous out of her nose. He denies difficulty swallowing. The cough is productive of clear/yellow mucous. The cough is worse at night. She denies fever, chills or body aches. She has tried Delsym, Claritin and Afrin with minimal relief. She has no history of breathing problems. She has not had sick contacts.  Review of Systems      Past Medical History:  Diagnosis Date  . Abnormal Pap smear   . Anxiety   . Anxiety   . Cough    HAS COLD. PRODUCTIVE CLEAR. NO FEVER. NO WHEEZING. TO SEE PCP IF WORSENS  . Endometriosis   . HA (headache)    otc med prn  . Hyperlipidemia    diet controlled, no meds  . Hypertension   . PONV (postoperative nausea and vomiting)   . Pregnancy induced hypertension 2008    Family History  Problem Relation Age of Onset  . Hypertension Father   . Coronary artery disease Paternal Uncle   . Cancer Paternal Grandfather   . Hypertension Mother   . Breast cancer Paternal Aunt   . Ovarian cancer Maternal Grandmother   . Diabetes Neg Hx     Social History   Socioeconomic History  . Marital status: Married    Spouse name: Not on file  . Number of children: 1  . Years of education: Not on file  . Highest education level: Not on file  Occupational History  . Occupation: Civil engineer, contractingolice officer    Employer: [employer]    Comment: only once in a while  . Occupation: Engineer, drillingDispatcher    Employer: [employer]    Comment: full time  Social Needs  . Financial resource strain: Not on file  . Food insecurity:    Worry: Not on file    Inability: Not on file  . Transportation needs:    Medical: Not on file    Non-medical: Not on file  Tobacco Use  . Smoking status: Never Smoker  . Smokeless tobacco: Never Used  Substance and Sexual Activity  . Alcohol use: No    Frequency: Never    Comment: occasional  . Drug use:  No  . Sexual activity: Yes    Birth control/protection: Surgical  Lifestyle  . Physical activity:    Days per week: Not on file    Minutes per session: Not on file  . Stress: Not on file  Relationships  . Social connections:    Talks on phone: Not on file    Gets together: Not on file    Attends religious service: Not on file    Active member of club or organization: Not on file    Attends meetings of clubs or organizations: Not on file    Relationship status: Not on file  . Intimate partner violence:    Fear of current or ex partner: Not on file    Emotionally abused: Not on file    Physically abused: Not on file    Forced sexual activity: Not on file  Other Topics Concern  . Not on file  Social History Narrative  . Not on file    Allergies  Allergen Reactions  . Sulfamethoxazole-Trimethoprim Swelling and Rash  . Amoxil [Amoxicillin] Rash     Has tolerated augmentin!!  ? If had viral resh with amox.  . Azithromycin Rash  . Cefprozil  Rash  . Ceftin [Cefuroxime Axetil] Rash  . Other Itching    CHG-wipes she had to use prier to surgery.  Ok to use CHG wash without problems CHG-wipes she had to use prier to surgery.  Ok to use CHG wash without problems  . Penicillins Rash    Has patient had a PCN reaction causing immediate rash, facial/tongue/throat swelling, SOB or lightheadedness with hypotension: Yes Has patient had a PCN reaction causing severe rash involving mucus membranes or skin necrosis: No Has patient had a PCN reaction that required hospitalization No Has patient had a PCN reaction occurring within the last 10 years: No If all of the above answers are "NO", then may proceed with Cephalosporin use.   . Sulfa Antibiotics Rash     Constitutional: Denies headache, fatigue, fever or  abrupt weight changes.  HEENT:  Positive nasal congestion, sore throat. Denies eye redness, eye pain, pressure behind the eyes, facial pain, ear pain, ringing in the ears, wax  buildup, runny nose or bloody nose. Respiratory: Positive cough. Denies difficulty breathing or shortness of breath.  Cardiovascular: Denies chest pain, chest tightness, palpitations or swelling in the hands or feet.   No other specific complaints in a complete review of systems (except as listed in HPI above).  Objective:   BP 128/86   Pulse 76   Temp 98.1 F (36.7 C) (Oral)   Wt 217 lb (98.4 kg)   LMP 10/15/2013   SpO2 98%   BMI 36.11 kg/m  Wt Readings from Last 3 Encounters:  12/11/17 217 lb (98.4 kg)  11/12/17 223 lb 8 oz (101.4 kg)  07/30/17 211 lb (95.7 kg)     General: Appears herstated age, well developed, well nourished in NAD. HEENT: Head: normal shape and size; Ears: Tm's gray and intact, normal light reflex; Nose: mucosa pink and moist, turbinates swollen; Throat/Mouth: + PND. Teeth present, mucosa pink and moist, no exudate noted, no lesions or ulcerations noted.  Neck: No cervical lymphadenopathy.  Cardiovascular: Normal rate and rhythm. S1,S2 noted.  No murmur, rubs or gallops noted.  Pulmonary/Chest: Normal effort and positive vesicular breath sounds. No respiratory distress. No wheezes, rales or ronchi noted.       Assessment & Plan:   Viral Upper Respiratory Infection with Cough:  Get some rest and drink plenty of water Do salt water gargles for the sore throat 80 mg Depo IM today eRx for Hycodan cough syrup Take Claritin 10 mg BID x 3 days Use Afrin as directed for no more than 3 days, then use Flonase/Nasocort OTC  RTC as needed or if symptoms persist.   Nicki Reaper, NP

## 2017-12-11 NOTE — Addendum Note (Signed)
Addended by: Roena MaladyEVONTENNO, Orva Gwaltney Y on: 12/11/2017 12:33 PM   Modules accepted: Orders

## 2017-12-24 ENCOUNTER — Encounter: Payer: Self-pay | Admitting: Internal Medicine

## 2017-12-25 ENCOUNTER — Other Ambulatory Visit: Payer: Self-pay

## 2017-12-25 ENCOUNTER — Ambulatory Visit (INDEPENDENT_AMBULATORY_CARE_PROVIDER_SITE_OTHER): Payer: BLUE CROSS/BLUE SHIELD | Admitting: Family Medicine

## 2017-12-25 ENCOUNTER — Other Ambulatory Visit: Payer: Self-pay | Admitting: Internal Medicine

## 2017-12-25 VITALS — BP 128/84 | HR 103 | Temp 99.9°F | Ht 65.0 in | Wt 217.6 lb

## 2017-12-25 DIAGNOSIS — H6983 Other specified disorders of Eustachian tube, bilateral: Secondary | ICD-10-CM | POA: Diagnosis not present

## 2017-12-25 DIAGNOSIS — B9789 Other viral agents as the cause of diseases classified elsewhere: Secondary | ICD-10-CM

## 2017-12-25 DIAGNOSIS — J069 Acute upper respiratory infection, unspecified: Secondary | ICD-10-CM

## 2017-12-25 DIAGNOSIS — J40 Bronchitis, not specified as acute or chronic: Secondary | ICD-10-CM | POA: Diagnosis not present

## 2017-12-25 LAB — POCT RAPID STREP A (OFFICE): Rapid Strep A Screen: NEGATIVE

## 2017-12-25 MED ORDER — PREDNISONE 10 MG PO TABS: 10.0000 mg | ORAL_TABLET | Freq: Two times a day (BID) | ORAL | 0 refills | Status: AC

## 2017-12-25 MED ORDER — AMOXICILLIN-POT CLAVULANATE 875-125 MG PO TABS: 1.0000 | ORAL_TABLET | Freq: Two times a day (BID) | ORAL | 0 refills | Status: DC

## 2017-12-25 MED ORDER — HYDROCODONE-HOMATROPINE 5-1.5 MG/5ML PO SYRP: 5.0000 mL | ORAL_SOLUTION | Freq: Three times a day (TID) | ORAL | 0 refills | Status: DC | PRN

## 2017-12-25 NOTE — Progress Notes (Addendum)
Subjective:  Patient ID: [name], female    DOB: 1980-12-11  Age: 37 y.o. MRN: [id number]  CC: Sore Throat (2-3 weeks sore throat and congestion started. Was beginning to feel better but now sore throat and fatigue are back. Daughter has strep.)   HPI [name] presents for evaluation of a 3 to 4-week history of an upper respiratory tract infection that is not seeming to pass on its own.  It has left her with frontal pressure, postnasal drip and clear rhinorrhea.  She also has a productive cough with milky to yellow phlegm.  She is currently suffering from malaise and fatigue.  She denies reactive airway disease or asthma issues.  Her ears have been congested.  She dealt with chronic ear infections and is status post BTL as a child.  She does not suffer from allergies.  She is otherwise healthy.  Her blood pressure is been well controlled with the Norvasc.  She has multiple drug allergies including a rash with amoxicillin but has been able to tolerate Augmentin.  Outpatient Medications Prior to Visit  Medication Sig Dispense Refill  . amLODipine (NORVASC) 10 MG tablet Take 1 tablet (10 mg total) by mouth daily. 90 tablet 3  . losartan-hydrochlorothiazide (HYZAAR) 100-25 MG tablet Take 1 tablet by mouth daily. 90 tablet 1  . methocarbamol (ROBAXIN) 750 MG tablet take 1 tablet by mouth three times a day if needed    . metoprolol succinate (TOPROL-XL) 50 MG 24 hr tablet Take 50 mg by mouth daily.  3  . norethindrone (AYGESTIN) 5 MG tablet Take 1 tablet (5 mg total) by mouth daily. 30 tablet 6  . PARoxetine (PAXIL) 10 MG tablet Take 1 tablet (10 mg total) by mouth daily. 30 tablet 3  . traMADol (ULTRAM) 50 MG tablet   1  . HYDROcodone-homatropine (HYCODAN) 5-1.5 MG/5ML syrup Take 5 mLs by mouth every 8 (eight) hours as needed. 120 mL 0  . zolpidem (AMBIEN) 5 MG tablet TAKE 1 TABLET BY MOUTH AT BEDTIME IF NEEDED FOR SLEEP 30 tablet 0   No facility-administered medications prior  to visit.     ROS Review of Systems  Constitutional: Positive for fatigue and fever. Negative for chills, diaphoresis and unexpected weight change.  HENT: Positive for congestion, hearing loss, postnasal drip, sinus pressure and sinus pain. Negative for ear pain and trouble swallowing.   Eyes: Negative for photophobia and visual disturbance.  Respiratory: Positive for cough. Negative for shortness of breath and wheezing.   Cardiovascular: Negative.   Gastrointestinal: Negative.   Genitourinary: Negative.   Musculoskeletal: Negative for arthralgias and myalgias.  Skin: Negative for pallor and rash.  Allergic/Immunologic: Negative for immunocompromised state.  Neurological: Positive for headaches. Negative for weakness.  Hematological: Does not bruise/bleed easily.  Psychiatric/Behavioral: Negative.     Objective:  BP 128/84 (BP Location: Left Arm, Patient Position: Sitting, Cuff Size: Normal)   Pulse (!) 103   Temp 99.9 F (37.7 C) (Oral)   Ht 5\' 5"  (1.651 m)   Wt 217 lb 9.6 oz (98.7 kg)   LMP 10/15/2013   SpO2 99%   BMI 36.21 kg/m   BP Readings from Last 3 Encounters:  12/25/17 128/84  12/11/17 128/86  11/12/17 137/83    Wt Readings from Last 3 Encounters:  12/25/17 217 lb 9.6 oz (98.7 kg)  12/11/17 217 lb (98.4 kg)  11/12/17 223 lb 8 oz (101.4 kg)    Physical Exam  Constitutional: She is oriented to person,  place, and time. She appears well-developed and well-nourished.  Non-toxic appearance. She does not appear ill. No distress.  HENT:  Head: Normocephalic and atraumatic.  Right Ear: Ear canal normal. Tympanic membrane is retracted. No middle ear effusion.  Left Ear: Ear canal normal. Tympanic membrane is retracted.  No middle ear effusion.  Mouth/Throat: Uvula is midline, oropharynx is clear and moist and mucous membranes are normal. No oropharyngeal exudate, posterior oropharyngeal edema, posterior oropharyngeal erythema or tonsillar abscesses.  Eyes: Pupils are  equal, round, and reactive to light. EOM are normal.  Neck: Normal range of motion. Neck supple. No thyromegaly present.  Cardiovascular: Normal rate, regular rhythm and normal heart sounds.  Pulmonary/Chest: Effort normal and breath sounds normal. No stridor. No respiratory distress. She has no wheezes. She has no rhonchi. She has no rales.  Lymphadenopathy:    She has no cervical adenopathy.  Neurological: She is alert and oriented to person, place, and time.  Skin: Skin is warm and dry.    Lab Results  Component Value Date   WBC 10.6 (H) 12/25/2017   HGB 14.2 12/25/2017   HCT 41.1 12/25/2017   PLT 385.0 12/25/2017   GLUCOSE 117 (H) 08/20/2015   CHOL 253 (H) 08/20/2015   TRIG 270.0 (H) 08/20/2015   HDL 47.10 08/20/2015   LDLDIRECT 162.0 08/20/2015   ALT 19 08/20/2015   AST 17 08/20/2015   NA 137 08/20/2015   K 4.0 07/13/2016   CL 103 08/20/2015   CREATININE 0.78 08/20/2015   BUN 12 08/20/2015   CO2 22 08/20/2015   TSH 1.71 08/25/2015   MICROALBUR 1.9 08/20/2015    No results found.  Assessment & Plan:   [name] was seen today for sore throat.  Diagnoses and all orders for this visit:  Viral upper respiratory tract infection with cough -     POCT rapid strep A  Bronchitis -     amoxicillin-clavulanate (AUGMENTIN) 875-125 MG tablet; Take 1 tablet by mouth 2 (two) times daily. -     predniSONE (DELTASONE) 10 MG tablet; Take 1 tablet (10 mg total) by mouth 2 (two) times daily with a meal for 5 days. -     Discontinue: HYDROcodone-homatropine (HYCODAN) 5-1.5 MG/5ML syrup; Take 5 mLs by mouth every 8 (eight) hours as needed for cough. -     CBC -     promethazine-dextromethorphan (PROMETHAZINE-DM) 6.25-15 MG/5ML syrup; Take 5 mLs by mouth 4 (four) times daily as needed for cough.  Dysfunction of both eustachian tubes -     predniSONE (DELTASONE) 10 MG tablet; Take 1 tablet (10 mg total) by mouth 2 (two) times daily with a meal for 5 days.   I have discontinued  [name]'s HYDROcodone-homatropine and HYDROcodone-homatropine. I am also having her start on amoxicillin-clavulanate, predniSONE, and promethazine-dextromethorphan. Additionally, I am having her maintain her losartan-hydrochlorothiazide, PARoxetine, amLODipine, norethindrone, traMADol, metoprolol succinate, and methocarbamol.  Meds ordered this encounter  Medications  . amoxicillin-clavulanate (AUGMENTIN) 875-125 MG tablet    Sig: Take 1 tablet by mouth 2 (two) times daily.    Dispense:  20 tablet    Refill:  0  . predniSONE (DELTASONE) 10 MG tablet    Sig: Take 1 tablet (10 mg total) by mouth 2 (two) times daily with a meal for 5 days.    Dispense:  10 tablet    Refill:  0  . DISCONTD: HYDROcodone-homatropine (HYCODAN) 5-1.5 MG/5ML syrup    Sig: Take 5 mLs by mouth every 8 (eight) hours  as needed for cough.    Dispense:  120 mL    Refill:  0  . promethazine-dextromethorphan (PROMETHAZINE-DM) 6.25-15 MG/5ML syrup    Sig: Take 5 mLs by mouth 4 (four) times daily as needed for cough.    Dispense:  118 mL    Refill:  0     Follow-up: Return in about 1 week (around 01/01/2018), or if symptoms worsen or fail to improve.  Mliss Sax, MD

## 2017-12-26 ENCOUNTER — Encounter: Payer: Self-pay | Admitting: Family Medicine

## 2017-12-26 LAB — CBC
HCT: 41.1 % (ref 36.0–46.0)
Hemoglobin: 14.2 g/dL (ref 12.0–15.0)
MCHC: 34.5 g/dL (ref 30.0–36.0)
MCV: 89.1 fl (ref 78.0–100.0)
Platelets: 385 10*3/uL (ref 150.0–400.0)
RBC: 4.61 Mil/uL (ref 3.87–5.11)
RDW: 12.5 % (ref 11.5–15.5)
WBC: 10.6 10*3/uL — ABNORMAL HIGH (ref 4.0–10.5)

## 2017-12-26 MED ORDER — ZOLPIDEM TARTRATE 5 MG PO TABS: 5.0000 mg | ORAL_TABLET | Freq: Once | ORAL | 0 refills | Status: DC

## 2017-12-28 ENCOUNTER — Encounter: Payer: Self-pay | Admitting: Family Medicine

## 2017-12-28 MED ORDER — PROMETHAZINE-DM 6.25-15 MG/5ML PO SYRP: 5.0000 mL | ORAL_SOLUTION | Freq: Four times a day (QID) | ORAL | 0 refills | Status: DC | PRN

## 2017-12-28 NOTE — Telephone Encounter (Unsigned)
Copied from CRM [phone]#[id number]. Topic: General - Other >> Dec 28, 2017  1:15 PM Jaquita Rectoravis, Karen A wrote: Reason for CRM:  Patient called to say that cough medication prescribed is not helping at this time and would like Dr to send Rx for Tussinex to pharmacy for her. Ph. # [phone]  Request a call back from nurse today if possible. Please advise

## 2017-12-28 NOTE — Addendum Note (Signed)
Addended by: Andrez GrimeKREMER, WILLIAM A on: 12/28/2017 04:10 PM   Modules accepted: Orders

## 2017-12-31 ENCOUNTER — Encounter: Payer: Self-pay | Admitting: Sports Medicine

## 2018-01-01 ENCOUNTER — Encounter: Payer: Self-pay | Admitting: Sports Medicine

## 2018-01-01 MED ORDER — GABAPENTIN 300 MG PO CAPS: ORAL_CAPSULE | ORAL | 0 refills | Status: DC

## 2018-01-01 NOTE — Telephone Encounter (Signed)
Per encounter 09/18/17 -  Dr. Berline Choughigby has approved your refill request for Gabapentin 300 mg, take 3 capsules by mouth three times daily, #270 with 1 additional refill. Please contact the office to schedule a follow-up visit prior to your next refill request.   OV scheduled for 01/08/18. 30 day supply sent to pharmacy.

## 2018-01-02 NOTE — Telephone Encounter (Signed)
Needs to keep appointment for any further refills. Its been over a year since being seen

## 2018-01-08 ENCOUNTER — Ambulatory Visit: Payer: BLUE CROSS/BLUE SHIELD | Admitting: Sports Medicine

## 2018-01-09 ENCOUNTER — Telehealth: Payer: Self-pay | Admitting: Sports Medicine

## 2018-01-09 NOTE — Telephone Encounter (Signed)
New Message  Called and left message for patient to CB and schedule appt with Dr. Berline Chough for tomorrow.

## 2018-01-11 ENCOUNTER — Other Ambulatory Visit: Payer: Self-pay | Admitting: Obstetrics and Gynecology

## 2018-01-11 MED ORDER — TRAMADOL HCL 50 MG PO TABS: 100.0000 mg | ORAL_TABLET | Freq: Four times a day (QID) | ORAL | 2 refills | Status: DC | PRN

## 2018-01-23 ENCOUNTER — Other Ambulatory Visit: Payer: Self-pay | Admitting: Obstetrics and Gynecology

## 2018-01-24 ENCOUNTER — Ambulatory Visit: Payer: BLUE CROSS/BLUE SHIELD | Attending: Obstetrics and Gynecology

## 2018-02-05 ENCOUNTER — Other Ambulatory Visit: Payer: Self-pay | Admitting: Physical Therapy

## 2018-02-05 ENCOUNTER — Encounter: Payer: Self-pay | Admitting: Sports Medicine

## 2018-02-05 ENCOUNTER — Other Ambulatory Visit: Payer: Self-pay | Admitting: Obstetrics and Gynecology

## 2018-02-05 MED ORDER — GABAPENTIN 300 MG PO CAPS: ORAL_CAPSULE | ORAL | 0 refills | Status: DC

## 2018-02-06 ENCOUNTER — Other Ambulatory Visit: Payer: Self-pay | Admitting: Obstetrics and Gynecology

## 2018-02-06 ENCOUNTER — Other Ambulatory Visit: Payer: Self-pay

## 2018-02-06 MED ORDER — TRAMADOL HCL 50 MG PO TABS: 100.0000 mg | ORAL_TABLET | Freq: Four times a day (QID) | ORAL | 0 refills | Status: DC | PRN

## 2018-02-06 MED ORDER — TRAMADOL HCL 50 MG PO TABS: 100.0000 mg | ORAL_TABLET | Freq: Four times a day (QID) | ORAL | 0 refills | Status: AC | PRN

## 2018-02-06 NOTE — Progress Notes (Signed)
Chief complaint: 1.  Medication refill 2.  Endometriosis  Patient called for prescription refill.  Dr. Valentino Saxon is out of town for the next 5 days.  She has follow-up with Dr. Valentino Saxon this month for annual exam. Patient has history of endometriosis, status post BSO.  She has failed multiple medical regimens to control pain.  Prescription will be given for 5 days duration for tramadol; 50 mg; 2 tablets every 6 hours as needed for 5 days; 40 tablets given; no refills.  ASSESSMENT: 1.  Chronic pelvic pain, possibly related to endometriosis, refractory to multiple drug regimens 2.  Prescription refill requested  PLAN: 1.  Single refill of tramadol 50 mg; 40 tablets; 2 tablets every 6 hours as needed; no refills 2.  Consider chronic pain management with nonnarcotic such as gabapentin and Celebrex in the future follow-up 3.  Follow-up with Dr. Valentino Saxon as scheduled.  Herold Harms, MD  Note: This dictation was prepared with Dragon dictation along with smaller phrase technology. Any transcriptional errors that result from this process are unintentional.

## 2018-02-11 ENCOUNTER — Encounter: Payer: Self-pay | Admitting: Family Medicine

## 2018-02-11 ENCOUNTER — Encounter: Payer: Self-pay | Admitting: Emergency Medicine

## 2018-02-11 ENCOUNTER — Ambulatory Visit: Payer: BLUE CROSS/BLUE SHIELD | Admitting: Family Medicine

## 2018-02-11 VITALS — BP 140/110 | HR 110 | Temp 98.2°F | Ht 65.0 in | Wt 223.0 lb

## 2018-02-11 DIAGNOSIS — I10 Essential (primary) hypertension: Secondary | ICD-10-CM | POA: Diagnosis not present

## 2018-02-11 DIAGNOSIS — R300 Dysuria: Secondary | ICD-10-CM

## 2018-02-11 DIAGNOSIS — N3090 Cystitis, unspecified without hematuria: Secondary | ICD-10-CM

## 2018-02-11 LAB — POC URINALSYSI DIPSTICK (AUTOMATED)
Bilirubin, UA: NEGATIVE
Blood, UA: POSITIVE
Glucose, UA: NEGATIVE
Ketones, UA: NEGATIVE
Nitrite, UA: NEGATIVE
Protein, UA: POSITIVE — AB
Spec Grav, UA: 1.025 (ref 1.010–1.025)
Urobilinogen, UA: 0.2 E.U./dL
pH, UA: 6 (ref 5.0–8.0)

## 2018-02-11 MED ORDER — NITROFURANTOIN MONOHYD MACRO 100 MG PO CAPS: 100.0000 mg | ORAL_CAPSULE | Freq: Two times a day (BID) | ORAL | 0 refills | Status: DC

## 2018-02-11 NOTE — Progress Notes (Signed)
Subjective:    Patient ID: [name], female    DOB: 10-16-1980, 37 y.o.   MRN: [id number]  HPI This is a 37 yo female who presents with dysuria x 3 days. Lower abdominal pain and pressure. No fever, no chills, no nausea, some low back pain- bilateral.  Has taken some AZO.  Has not had blood pressure meds this am.   Past Medical History:  Diagnosis Date  . Abnormal Pap smear   . Anxiety   . Anxiety   . Cough    HAS COLD. PRODUCTIVE CLEAR. NO FEVER. NO WHEEZING. TO SEE PCP IF WORSENS  . Endometriosis   . HA (headache)    otc med prn  . Hyperlipidemia    diet controlled, no meds  . Hypertension   . PONV (postoperative nausea and vomiting)   . Pregnancy induced hypertension 2008   Past Surgical History:  Procedure Laterality Date  . ABDOMINAL HYSTERECTOMY    . ABLATION ON ENDOMETRIOSIS N/A 06/04/2014   Procedure: ABLATION ON ENDOMETRIOSIS;  Surgeon: Leslie Andrea, MD;  Location: [facility];  Service: Gynecology;  Laterality: N/A;  . BIOPSY  12/01/2011   Procedure: BIOPSY;  Surgeon: Leslie Andrea, MD;  Location: [facility];  Service: Gynecology;  Laterality: N/A;  . CESAREAN SECTION  2008  . CESAREAN SECTION WITH BILATERAL TUBAL LIGATION N/A 12/02/2012   Procedure: Repeat Cesarean Section Delivery Baby Girl @ 1826, Apgars 8/9, Bilateral Tubal Ligation;  Surgeon: Leslie Andrea, MD;  Location: [facility];  Service: Obstetrics;  Laterality: N/A;  . COLPOSCOPY     as teenager, results WNL  . ENDOMETRIAL BIOPSY    . LAPAROSCOPIC ASSISTED VAGINAL HYSTERECTOMY  2012   with bilateral salpingectomy  . LAPAROSCOPIC BILATERAL SALPINGECTOMY Bilateral 02/15/2015   Procedure: LAPAROSCOPIC BILATERAL SALPINGECTOMY;  Surgeon: Hildred Laser, MD;  Location: [facility];  Service: Gynecology;  Laterality: Bilateral;  . LAPAROSCOPIC LYSIS OF ADHESIONS N/A 06/04/2014   Procedure: LAPAROSCOPIC LYSIS OF ADHESIONS;  Surgeon: Leslie Andrea, MD;  Location: [facility];  Service: Gynecology;   Laterality: N/A;  . LAPAROSCOPIC SALPINGO OOPHERECTOMY Right 07/17/2016   Procedure: LAPAROSCOPIC RIGHT SALPINGO OOPHORECTOMY;  Surgeon: Hildred Laser, MD;  Location: [facility];  Service: Gynecology;  Laterality: Right;  . LAPAROSCOPY  12/01/2011   Procedure: LAPAROSCOPY OPERATIVE;  Surgeon: Leslie Andrea, MD;  Location: [facility];  Service: Gynecology;  Laterality: N/A;  . LAPAROSCOPY N/A 06/04/2014   Procedure: LAPAROSCOPY OPERATIVE;  Surgeon: Leslie Andrea, MD;  Location: [facility];  Service: Gynecology;  Laterality: N/A;  . LAPAROSCOPY N/A 02/14/2016   Procedure: LAPAROSCOPY DIAGNOSTIC WITH BIOPSIES POSSIBLE ADHESIOLYSIS;  Surgeon: Hildred Laser, MD;  Location: [facility];  Service: Gynecology;  Laterality: N/A;  . LAPAROSCOPY N/A 05/26/2016   Procedure: LAPAROSCOPY operative with lysis of adhesions;  Surgeon: Linzie Collin, MD;  Location: [facility];  Service: Gynecology;  Laterality: N/A;  . OOPHORECTOMY Left   . SALPINGECTOMY Bilateral   . TONSILLECTOMY    . TONSILLECTOMY    . TUBAL LIGATION    . TYMPANOSTOMY TUBE PLACEMENT     Family History  Problem Relation Age of Onset  . Hypertension Father   . Coronary artery disease Paternal Uncle   . Cancer Paternal Grandfather   . Hypertension Mother   . Breast cancer Paternal Aunt   . Ovarian cancer Maternal Grandmother   . Diabetes Neg Hx    Social History   Tobacco Use  .  Smoking status: Never Smoker  . Smokeless tobacco: Never Used  Substance Use Topics  . Alcohol use: No    Frequency: Never    Comment: occasional  . Drug use: No      Review of Systems Per HPI    Objective:   Physical Exam Physical Exam  Constitutional: She is oriented to person, place, and time. She appears well-developed and well-nourished. No distress.  HENT:  Head: Normocephalic and atraumatic.  Cardiovascular: Normal rate, regular rhythm and normal heart sounds.   Pulmonary/Chest: Effort normal and breath sounds normal.  Abdominal: Soft. She  exhibits no distension. There is mild suprapubic tenderness. There is no rebound, no guarding and no CVA tenderness.  Neurological: She is alert and oriented to person, place, and time.  Skin: Skin is warm and dry. She is not diaphoretic.  Psychiatric: She has a normal mood and affect. Her behavior is normal. Judgment and thought content normal.  Vitals reviewed.     BP (!) 140/110   Pulse (!) 110   Temp 98.2 F (36.8 C)   Ht 5\' 5"  (1.651 m)   Wt 223 lb (101.2 kg)   LMP 10/15/2013   SpO2 98%   BMI 37.11 kg/m  Wt Readings from Last 3 Encounters:  02/11/18 223 lb (101.2 kg)  12/25/17 217 lb 9.6 oz (98.7 kg)  12/11/17 217 lb (98.4 kg)   BP Readings from Last 3 Encounters:  02/11/18 (!) 140/110  12/25/17 128/84  12/11/17 128/86   Results for orders placed or performed in visit on 02/11/18  Urine Culture  Result Value Ref Range   MICRO NUMBER: [id number]    SPECIMEN QUALITY: ADEQUATE    Sample Source URINE    STATUS: FINAL    ISOLATE 1: Streptococcus agalactiae (A)   POCT Urinalysis Dipstick (Automated)  Result Value Ref Range   Color, UA yellow    Clarity, UA clear    Glucose, UA Negative Negative   Bilirubin, UA neg    Ketones, UA neg    Spec Grav, UA 1.025 1.010 - 1.025   Blood, UA positive    pH, UA 6.0 5.0 - 8.0   Protein, UA Positive (A) Negative   Urobilinogen, UA 0.2 0.2 or 1.0 E.U./dL   Nitrite, UA neg    Leukocytes, UA Large (3+) (A) Negative       Assessment & Plan:  1. Dysuria - POCT Urinalysis Dipstick (Automated) - Urine Culture  2. Cystitis - Provided written and verbal information regarding diagnosis and treatment. - RTC precautions reviewed  3. Essential (primary) hypertension - blood pressure up today, likely due to discomfort. Last several readings have been normal - she has an appointment this week with Dr. Berline Chough, I will check Epic and see if BP improved   Olean Ree, FNP-BC  [facility], [facility]  02/13/2018 8:00 AM

## 2018-02-11 NOTE — Patient Instructions (Signed)
Good to see you today, you have a bladder infection  I have sent an antibiotic to your pharmacy  If you get worsening symptoms, please let me know

## 2018-02-13 ENCOUNTER — Other Ambulatory Visit: Payer: Self-pay | Admitting: Obstetrics and Gynecology

## 2018-02-13 ENCOUNTER — Encounter: Payer: BLUE CROSS/BLUE SHIELD | Admitting: Obstetrics and Gynecology

## 2018-02-13 ENCOUNTER — Telehealth: Payer: Self-pay

## 2018-02-13 ENCOUNTER — Encounter: Payer: Self-pay | Admitting: Family Medicine

## 2018-02-13 LAB — URINE CULTURE
MICRO NUMBER:: 91293721
SPECIMEN QUALITY:: ADEQUATE

## 2018-02-13 MED ORDER — TRAMADOL HCL 50 MG PO TABS: 100.0000 mg | ORAL_TABLET | Freq: Four times a day (QID) | ORAL | 2 refills | Status: DC | PRN

## 2018-02-13 NOTE — Telephone Encounter (Signed)
Pt called back she stated that the medication was wearing off before 6 hours. Pt stated that she is taking 6 pills daily.

## 2018-02-14 ENCOUNTER — Ambulatory Visit: Payer: BLUE CROSS/BLUE SHIELD | Admitting: Sports Medicine

## 2018-02-14 DIAGNOSIS — Z0289 Encounter for other administrative examinations: Secondary | ICD-10-CM

## 2018-02-22 ENCOUNTER — Encounter: Payer: Self-pay | Admitting: Sports Medicine

## 2018-02-27 ENCOUNTER — Encounter: Payer: BLUE CROSS/BLUE SHIELD | Admitting: Obstetrics and Gynecology

## 2018-02-27 ENCOUNTER — Other Ambulatory Visit: Payer: BLUE CROSS/BLUE SHIELD

## 2018-02-27 ENCOUNTER — Other Ambulatory Visit (INDEPENDENT_AMBULATORY_CARE_PROVIDER_SITE_OTHER): Payer: BLUE CROSS/BLUE SHIELD

## 2018-02-27 ENCOUNTER — Other Ambulatory Visit: Payer: Self-pay | Admitting: Family Medicine

## 2018-02-27 ENCOUNTER — Encounter: Payer: Self-pay | Admitting: Family Medicine

## 2018-02-27 DIAGNOSIS — R300 Dysuria: Secondary | ICD-10-CM

## 2018-02-27 LAB — URINALYSIS, ROUTINE W REFLEX MICROSCOPIC

## 2018-02-28 LAB — URINE CULTURE
MICRO NUMBER:: 91367392
SPECIMEN QUALITY:: ADEQUATE

## 2018-03-01 ENCOUNTER — Encounter: Payer: Self-pay | Admitting: Family Medicine

## 2018-03-03 ENCOUNTER — Encounter: Payer: Self-pay | Admitting: Emergency Medicine

## 2018-03-03 ENCOUNTER — Emergency Department: Payer: BLUE CROSS/BLUE SHIELD

## 2018-03-03 ENCOUNTER — Telehealth: Payer: Self-pay | Admitting: Family Medicine

## 2018-03-03 ENCOUNTER — Other Ambulatory Visit: Payer: Self-pay

## 2018-03-03 ENCOUNTER — Emergency Department
Admission: EM | Admit: 2018-03-03 | Discharge: 2018-03-03 | Disposition: A | Discharge status: Home / Self Care | Payer: BLUE CROSS/BLUE SHIELD | Attending: Emergency Medicine | Admitting: Emergency Medicine

## 2018-03-03 DIAGNOSIS — Z79899 Other long term (current) drug therapy: Secondary | ICD-10-CM | POA: Diagnosis not present

## 2018-03-03 DIAGNOSIS — R109 Unspecified abdominal pain: Secondary | ICD-10-CM | POA: Diagnosis present

## 2018-03-03 DIAGNOSIS — I10 Essential (primary) hypertension: Secondary | ICD-10-CM | POA: Diagnosis not present

## 2018-03-03 DIAGNOSIS — N12 Tubulo-interstitial nephritis, not specified as acute or chronic: Secondary | ICD-10-CM

## 2018-03-03 DIAGNOSIS — R309 Painful micturition, unspecified: Secondary | ICD-10-CM | POA: Insufficient documentation

## 2018-03-03 LAB — CHLAMYDIA/NGC RT PCR (ARMC ONLY)
Chlamydia Tr: NOT DETECTED
N gonorrhoeae: NOT DETECTED

## 2018-03-03 LAB — CBC
HCT: 39.1 % (ref 36.0–46.0)
Hemoglobin: 13.1 g/dL (ref 12.0–15.0)
MCH: 29.9 pg (ref 26.0–34.0)
MCHC: 33.5 g/dL (ref 30.0–36.0)
MCV: 89.3 fL (ref 80.0–100.0)
Platelets: 353 10*3/uL (ref 150–400)
RBC: 4.38 MIL/uL (ref 3.87–5.11)
RDW: 12.2 % (ref 11.5–15.5)
WBC: 4.7 10*3/uL (ref 4.0–10.5)
nRBC: 0 % (ref 0.0–0.2)

## 2018-03-03 LAB — WET PREP, GENITAL
Clue Cells Wet Prep HPF POC: NONE SEEN
Sperm: NONE SEEN
Trich, Wet Prep: NONE SEEN
Yeast Wet Prep HPF POC: NONE SEEN

## 2018-03-03 LAB — URINALYSIS, COMPLETE (UACMP) WITH MICROSCOPIC
Bacteria, UA: NONE SEEN
Bilirubin Urine: NEGATIVE
Glucose, UA: NEGATIVE mg/dL
Hgb urine dipstick: NEGATIVE
Ketones, ur: NEGATIVE mg/dL
Nitrite: NEGATIVE
Protein, ur: NEGATIVE mg/dL
Specific Gravity, Urine: 1.015 (ref 1.005–1.030)
WBC, UA: >50 WBC/hpf — ABNORMAL HIGH (ref 0–5)
pH: 6 (ref 5.0–8.0)

## 2018-03-03 LAB — BASIC METABOLIC PANEL
Anion gap: 6 (ref 5–15)
BUN: 12 mg/dL (ref 6–20)
CO2: 29 mmol/L (ref 22–32)
Calcium: 9.2 mg/dL (ref 8.9–10.3)
Chloride: 102 mmol/L (ref 98–111)
Creatinine, Ser: 0.62 mg/dL (ref 0.44–1.00)
GFR calc Af Amer: >60 mL/min (ref >60)
GFR calc non Af Amer: >60 mL/min (ref >60)
Glucose, Bld: 136 mg/dL — ABNORMAL HIGH (ref 70–99)
Potassium: 3.2 mmol/L — ABNORMAL LOW (ref 3.5–5.1)
Sodium: 137 mmol/L (ref 135–145)

## 2018-03-03 MED ORDER — LEVOFLOXACIN 750 MG PO TABS: 750.0000 mg | ORAL_TABLET | Freq: Every day | ORAL | 0 refills | Status: AC

## 2018-03-03 MED ORDER — LEVOFLOXACIN 750 MG PO TABS
750.0000 mg | ORAL_TABLET | Freq: Once | ORAL | Status: AC
  Administered 2018-03-03: 750 mg via ORAL
  Filled 2018-03-03: qty 1

## 2018-03-03 MED ORDER — OXYCODONE-ACETAMINOPHEN 5-325 MG PO TABS
1.0000 | ORAL_TABLET | Freq: Once | ORAL | Status: AC
  Administered 2018-03-03: 1 via ORAL
  Filled 2018-03-03: qty 1

## 2018-03-03 MED ORDER — LEVOFLOXACIN IN D5W 750 MG/150ML IV SOLN
750.0000 mg | Freq: Once | INTRAVENOUS | Status: DC
  Filled 2018-03-03: qty 150

## 2018-03-03 NOTE — Telephone Encounter (Signed)
Please call patient and see how she is doing since ER visit. Ask is she is still taking her metoprolol, amlodipine and losartan-HCTZ for her blood pressure?

## 2018-03-03 NOTE — ED Triage Notes (Signed)
Pt c/o left flank pain, pt recently treated for UTI for 6-7 days, but states she continues to have urinary sxs, urgency frequency and suprapubic discomfort.  Pt has been trying to push fluids and was taking AZO, but recently stopped due to ineffectiveness.  Pt had recent UA and culture on 11/13 that did not grow anything from PCP's office.

## 2018-03-03 NOTE — ED Provider Notes (Signed)
[facility] Emergency Department Provider Note  ___________________________________________   First MD Initiated Contact with Patient 03/03/18 1014     (approximate)  I have reviewed the triage vital signs and the nursing notes.   HISTORY  Chief Complaint Flank Pain   HPI [name] is a 37 y.o. female with a history of 2 weeks of burning and pressure with urination and now left flank pain over the past several days.  She says the pain is a 6 out of 10 and cramping.  Does not report fever.  Says that she has had a course of Macrobid as well as Azo without resolution of the symptoms.  Says that her primary care doctor also states that she had a negative urine culture.  Patient denies any vaginal discharge or bleeding.  Says that she has had yeast infections but denies any history of STDs.  Has an aunt with kidney stones but no other close relatives.  Patient with history of hysterectomy with bilateral salpingo-oophorectomy.   Past Medical History:  Diagnosis Date  . Abnormal Pap smear   . Anxiety   . Anxiety   . Cough    HAS COLD. PRODUCTIVE CLEAR. NO FEVER. NO WHEEZING. TO SEE PCP IF WORSENS  . Endometriosis   . HA (headache)    otc med prn  . Hyperlipidemia    diet controlled, no meds  . Hypertension   . PONV (postoperative nausea and vomiting)   . Pregnancy induced hypertension 2008    Patient Active Problem List   Diagnosis Date Noted  . Dysfunction of both eustachian tubes 12/25/2017  . Osteoarthritis of spine with radiculopathy, lumbar region 06/20/2016  . Strep pharyngitis 03/30/2016  . H/O abdominal surgery 02/09/2016  . Viral upper respiratory infection 08/20/2015  . Chronic pelvic pain in female 11/30/2014  . Endometriosis 11/30/2014  . Anxiety 09/01/2014  . Essential (primary) hypertension 09/01/2014  . Cyst of ovary 09/01/2014  . Class 2 obesity 09/01/2014  . Headache 07/22/2014  . Abdominal cramping 12/11/2013  .  Anxiety state, unspecified 12/11/2013  . Bronchitis 05/27/2013  . Routine general medical examination at a health care facility 04/06/2011  . Metabolic syndrome 04/06/2011  . Insomnia 12/20/2007  . ALLERGIC RHINITIS 07/18/2007  . HYPERLIPIDEMIA 02/19/2007    Past Surgical History:  Procedure Laterality Date  . ABDOMINAL HYSTERECTOMY    . ABLATION ON ENDOMETRIOSIS N/A 06/04/2014   Procedure: ABLATION ON ENDOMETRIOSIS;  Surgeon: Leslie Andrea, MD;  Location: [facility];  Service: Gynecology;  Laterality: N/A;  . BIOPSY  12/01/2011   Procedure: BIOPSY;  Surgeon: Leslie Andrea, MD;  Location: [facility];  Service: Gynecology;  Laterality: N/A;  . CESAREAN SECTION  2008  . CESAREAN SECTION WITH BILATERAL TUBAL LIGATION N/A 12/02/2012   Procedure: Repeat Cesarean Section Delivery Baby Girl @ 1826, Apgars 8/9, Bilateral Tubal Ligation;  Surgeon: Leslie Andrea, MD;  Location: [facility];  Service: Obstetrics;  Laterality: N/A;  . COLPOSCOPY     as teenager, results WNL  . ENDOMETRIAL BIOPSY    . LAPAROSCOPIC ASSISTED VAGINAL HYSTERECTOMY  2012   with bilateral salpingectomy  . LAPAROSCOPIC BILATERAL SALPINGECTOMY Bilateral 02/15/2015   Procedure: LAPAROSCOPIC BILATERAL SALPINGECTOMY;  Surgeon: Hildred Laser, MD;  Location: [facility];  Service: Gynecology;  Laterality: Bilateral;  . LAPAROSCOPIC LYSIS OF ADHESIONS N/A 06/04/2014   Procedure: LAPAROSCOPIC LYSIS OF ADHESIONS;  Surgeon: Leslie Andrea, MD;  Location: [facility];  Service: Gynecology;  Laterality:  N/A;  . LAPAROSCOPIC SALPINGO OOPHERECTOMY Right 07/17/2016   Procedure: LAPAROSCOPIC RIGHT SALPINGO OOPHORECTOMY;  Surgeon: Hildred Laser, MD;  Location: [facility];  Service: Gynecology;  Laterality: Right;  . LAPAROSCOPY  12/01/2011   Procedure: LAPAROSCOPY OPERATIVE;  Surgeon: Leslie Andrea, MD;  Location: [facility];  Service: Gynecology;  Laterality: N/A;  . LAPAROSCOPY N/A 06/04/2014   Procedure: LAPAROSCOPY OPERATIVE;  Surgeon: Leslie Andrea, MD;  Location: [facility];  Service: Gynecology;  Laterality: N/A;  . LAPAROSCOPY N/A 02/14/2016   Procedure: LAPAROSCOPY DIAGNOSTIC WITH BIOPSIES POSSIBLE ADHESIOLYSIS;  Surgeon: Hildred Laser, MD;  Location: [facility];  Service: Gynecology;  Laterality: N/A;  . LAPAROSCOPY N/A 05/26/2016   Procedure: LAPAROSCOPY operative with lysis of adhesions;  Surgeon: Linzie Collin, MD;  Location: [facility];  Service: Gynecology;  Laterality: N/A;  . OOPHORECTOMY Left   . SALPINGECTOMY Bilateral   . TONSILLECTOMY    . TONSILLECTOMY    . TUBAL LIGATION    . TYMPANOSTOMY TUBE PLACEMENT      Prior to Admission medications   Medication Sig Start Date End Date Taking? Authorizing Provider  amLODipine (NORVASC) 10 MG tablet Take 1 tablet (10 mg total) by mouth daily. 07/30/17  Yes Emi Belfast, FNP  gabapentin (NEURONTIN) 300 MG capsule take 3 capsules by mouth 3x daily or as directed 02/05/18  Yes Andrena Mews, DO  losartan-hydrochlorothiazide (HYZAAR) 100-25 MG tablet Take 1 tablet by mouth daily. 01/10/17  Yes Emi Belfast, FNP  metoprolol succinate (TOPROL-XL) 50 MG 24 hr tablet Take 50 mg by mouth daily. 10/04/17  Yes [provider]  norethindrone (AYGESTIN) 5 MG tablet Take 1 tablet (5 mg total) by mouth daily. 11/12/17  Yes Hildred Laser, MD  traMADol (ULTRAM) 50 MG tablet Take 2 tablets (100 mg total) by mouth every 6 (six) hours as needed. 02/13/18  Yes Hildred Laser, MD  nitrofurantoin, macrocrystal-monohydrate, (MACROBID) 100 MG capsule Take 1 capsule (100 mg total) by mouth 2 (two) times daily. Patient not taking: Reported on 03/03/2018 02/11/18   Emi Belfast, FNP  PARoxetine (PAXIL) 10 MG tablet Take 1 tablet (10 mg total) by mouth daily. Patient not taking: Reported on 03/03/2018 01/23/17   Hildred Laser, MD  zolpidem (AMBIEN) 5 MG tablet Take 1 tablet (5 mg total) by mouth once for 1 dose. 12/26/17 12/26/17  Hildred Laser, MD     Allergies Sulfamethoxazole-trimethoprim; Amoxil [amoxicillin]; Azithromycin; Cefprozil; Ceftin [cefuroxime axetil]; Other; Penicillins; and Sulfa antibiotics  Family History  Problem Relation Age of Onset  . Hypertension Father   . Coronary artery disease Paternal Uncle   . Cancer Paternal Grandfather   . Hypertension Mother   . Breast cancer Paternal Aunt   . Ovarian cancer Maternal Grandmother   . Diabetes Neg Hx     Social History Social History   Tobacco Use  . Smoking status: Never Smoker  . Smokeless tobacco: Never Used  Substance Use Topics  . Alcohol use: No    Frequency: Never    Comment: occasional  . Drug use: No    Review of Systems  Constitutional: No fever/chills Eyes: No visual changes. ENT: No sore throat. Cardiovascular: Denies chest pain. Respiratory: Denies shortness of breath. Gastrointestinal:   No nausea, no vomiting.  No diarrhea.  No constipation. Genitourinary: As above Musculoskeletal: As above Skin: Negative for rash. Neurological: Negative for headaches, focal weakness or numbness.   ____________________________________________   PHYSICAL EXAM:  VITAL SIGNS: ED Triage Vitals  Enc Vitals Group     BP 03/03/18 0952 (!) 182/121     Pulse Rate 03/03/18 0952 93     Resp 03/03/18 0952 18     Temp 03/03/18 0952 98.4 F (36.9 C)     Temp Source 03/03/18 0952 Oral     SpO2 03/03/18 0952 97 %     Weight 03/03/18 0951 223 lb (101.2 kg)     Height 03/03/18 0951 5\' 5"  (1.651 m)     Head Circumference --      Peak Flow --      Pain Score 03/03/18 0951 7     Pain Loc --      Pain Edu? --      Excl. in GC? --     Constitutional: Alert and oriented. Well appearing and in no acute distress. Eyes: Conjunctivae are normal.  Head: Atraumatic. Nose: No congestion/rhinnorhea. Mouth/Throat: Mucous membranes are moist.  Neck: No stridor.   Cardiovascular: Normal rate, regular rhythm. Grossly normal heart sounds.  Respiratory: Normal  respiratory effort.  No retractions. Lungs CTAB. Gastrointestinal: Soft with trace tenderness to palpation to the suprapubic region.  No rebound or guarding.. No distention.  Mild left-sided CVA. Genitourinary: External exam without any lesions.  Speculum exam with small amount of cervical mucus.  No bleeding noted.  Cervix is missing. Musculoskeletal: No lower extremity tenderness nor edema.  No joint effusions. Neurologic:  Normal speech and language. No gross focal neurologic deficits are appreciated. Skin:  Skin is warm, dry and intact. No rash noted. Psychiatric: Mood and affect are normal. Speech and behavior are normal.  ____________________________________________   LABS (all labs ordered are listed, but only abnormal results are displayed)  Labs Reviewed  WET PREP, GENITAL - Abnormal; Notable for the following components:      Result Value   WBC, Wet Prep HPF POC FEW (*)    All other components within normal limits  URINALYSIS, COMPLETE (UACMP) WITH MICROSCOPIC - Abnormal; Notable for the following components:   Color, Urine YELLOW (*)    APPearance HAZY (*)    Leukocytes, UA SMALL (*)    WBC, UA >50 (*)    Non Squamous Epithelial PRESENT (*)    All other components within normal limits  BASIC METABOLIC PANEL - Abnormal; Notable for the following components:   Potassium 3.2 (*)    Glucose, Bld 136 (*)    All other components within normal limits  CHLAMYDIA/NGC RT PCR ([facility] ONLY)  CBC   ____________________________________________  EKG   ____________________________________________  RADIOLOGY  CT renal without renal or bladder calculi.  Mildly thick-walled bladder.  Possibly related to the clinical history of recent treated UTI. ____________________________________________   PROCEDURES  Procedure(s) performed:   Procedures  Critical Care performed:   ____________________________________________   INITIAL IMPRESSION / ASSESSMENT AND PLAN / ED  COURSE  Pertinent labs & imaging results that were available during my care of the patient were reviewed by me and considered in my medical decision making (see chart for details).  Differential diagnosis includes, but is not limited to, ovarian cyst, ovarian torsion, acute appendicitis, diverticulitis, urinary tract infection/pyelonephritis, endometriosis, bowel obstruction, colitis, renal colic, gastroenteritis, hernia, fibroids, endometriosis, pregnancy related pain including ectopic pregnancy, etc. As part of my medical decision making, I reviewed the following data within the electronic MEDICAL RECORD NUMBER Notes from prior ED visits  ----------------------------------------- 12:01 PM on 03/03/2018 -----------------------------------------  Patient at this time with reassuring CAT scan.  Says that she had a  clean-catch urine although there are epithelial cells present.  Greater than 50 white blood cells.  Patient to be treated for pyelonephritis as this is the clinical symptomatology that she is displaying.  Says that she has an allergy to cephalosporins.  Will be treated with Levaquin.  Patient initially with group B strep positive urinalysis with greater than 100,000 colonies.  Second urinalysis on 14 November which was negative except for less than 10,000 colonies of what appeared to be contaminant.  However, the patient's symptoms as well as CT results suggest infection/inflammation.  Patient referred to urology.  She knows to return to the emergency department medially for any worsening or concerning symptoms.  He is understanding the diagnosis as well as treatment and willing to comply. ____________________________________________   FINAL CLINICAL IMPRESSION(S) / ED DIAGNOSES  Pyelonephritis.  NEW MEDICATIONS STARTED DURING THIS VISIT:  New Prescriptions   No medications on file     Note:  This document was prepared using Dragon voice recognition software and may include  unintentional dictation errors.     Myrna BlazerSchaevitz, David Matthew, MD 03/03/18 [phone]

## 2018-03-03 NOTE — ED Notes (Signed)
Patient transported to CT 

## 2018-03-04 ENCOUNTER — Telehealth: Payer: Self-pay | Admitting: Family Medicine

## 2018-03-04 LAB — URINE CULTURE

## 2018-03-04 NOTE — Telephone Encounter (Signed)
Noted. Patient seen in ER.

## 2018-03-04 NOTE — Telephone Encounter (Signed)
Pt called [facility] on 03/01/18 at 7:04pm and reported that she recently completed antibiotics for a UTI.  Four days after completing the antibiotics she bagan again having frequency/urgency again.  Urine sample taken to MD office - no infection present per provider office.  Today patient began having left sided low back pain and is concerned that she may have kidney stones.  Mild nausea present.  No other symptoms. No fever.  TH advised her to see her PCP within  24 hours and to call and make an appt.  For pain relief take acetaminophen, ibuprofen or naproxen.  TH also documented that pain is 6/10 and constant pain since the AM at 5:30.  Caller described pain as throbbing. Tylenol and heating pad used with only slight effect.

## 2018-03-05 ENCOUNTER — Encounter: Payer: BLUE CROSS/BLUE SHIELD | Admitting: Obstetrics and Gynecology

## 2018-03-05 NOTE — Telephone Encounter (Signed)
Called and left VM for patient return call to office

## 2018-03-05 NOTE — Telephone Encounter (Signed)
Pt returning your call. Please call pt. °

## 2018-03-05 NOTE — Telephone Encounter (Signed)
Called and spoke with patient. She states that the Dr. Prentiss BellsPut her on another antibiotic. Patient is currently taking all Bp medications.

## 2018-03-06 NOTE — Telephone Encounter (Signed)
Please call and schedule her a BP follow up appointment for 2-4 weeks.

## 2018-03-06 NOTE — Telephone Encounter (Signed)
Called and left detailed message for pt schedule a follow up appointment.

## 2018-03-13 NOTE — Telephone Encounter (Signed)
Discussed with patient in office

## 2018-03-18 ENCOUNTER — Ambulatory Visit: Payer: BLUE CROSS/BLUE SHIELD | Attending: Obstetrics and Gynecology

## 2018-03-18 ENCOUNTER — Encounter: Payer: Self-pay | Admitting: Family Medicine

## 2018-03-18 ENCOUNTER — Ambulatory Visit (INDEPENDENT_AMBULATORY_CARE_PROVIDER_SITE_OTHER): Payer: BLUE CROSS/BLUE SHIELD | Admitting: Family Medicine

## 2018-03-18 VITALS — BP 126/80 | HR 108 | Temp 98.4°F | Ht 65.0 in | Wt 219.2 lb

## 2018-03-18 DIAGNOSIS — H6982 Other specified disorders of Eustachian tube, left ear: Secondary | ICD-10-CM

## 2018-03-18 DIAGNOSIS — B349 Viral infection, unspecified: Secondary | ICD-10-CM | POA: Diagnosis not present

## 2018-03-18 MED ORDER — HYDROCODONE-HOMATROPINE 5-1.5 MG/5ML PO SYRP: 5.0000 mL | ORAL_SOLUTION | Freq: Four times a day (QID) | ORAL | 0 refills | Status: DC | PRN

## 2018-03-18 MED ORDER — PREDNISONE 10 MG PO TABS: 10.0000 mg | ORAL_TABLET | Freq: Two times a day (BID) | ORAL | 0 refills | Status: AC

## 2018-03-18 NOTE — Addendum Note (Signed)
Addended by: Marcell AngerSELF, Avey Mcmanamon E on: 03/18/2018 11:40 AM   Modules accepted: Orders

## 2018-03-18 NOTE — Patient Instructions (Signed)
Eustachian Tube Dysfunction The eustachian tube connects the middle ear to the back of the nose. It regulates air pressure in the middle ear by allowing air to move between the ear and nose. It also helps to drain fluid from the middle ear space. When the eustachian tube does not function properly, air pressure, fluid, or both can build up in the middle ear. Eustachian tube dysfunction can affect one or both ears. What are the causes? This condition happens when the eustachian tube becomes blocked or cannot open normally. This may result from:  Ear infections.  Colds and other upper respiratory infections.  Allergies.  Irritation, such as from cigarette smoke or acid from the stomach coming up into the esophagus (gastroesophageal reflux).  Sudden changes in air pressure, such as from descending in an airplane.  Abnormal growths in the nose or throat, such as nasal polyps, tumors, or enlarged tissue at the back of the throat (adenoids).  What increases the risk? This condition may be more likely to develop in people who smoke and people who are overweight. Eustachian tube dysfunction may also be more likely to develop in children, especially children who have:  Certain birth defects of the mouth, such as cleft palate.  Large tonsils and adenoids.  What are the signs or symptoms? Symptoms of this condition may include:  A feeling of fullness in the ear.  Ear pain.  Clicking or popping noises in the ear.  Ringing in the ear.  Hearing loss.  Loss of balance.  Symptoms may get worse when the air pressure around you changes, such as when you travel to an area of high elevation or fly on an airplane. How is this diagnosed? This condition may be diagnosed based on:  Your symptoms.  A physical exam of your ear, nose, and throat.  Tests, such as those that measure: ? The movement of your eardrum (tympanogram). ? Your hearing (audiometry).  How is this treated? Treatment  depends on the cause and severity of your condition. If your symptoms are mild, you may be able to relieve your symptoms by moving air into ("popping") your ears. If you have symptoms of fluid in your ears, treatment may include:  Decongestants.  Antihistamines.  Nasal sprays or ear drops that contain medicines that reduce swelling (steroids).  In some cases, you may need to have a procedure to drain the fluid in your eardrum (myringotomy). In this procedure, a small tube is placed in the eardrum to:  Drain the fluid.  Restore the air in the middle ear space.  Follow these instructions at home:  Take over-the-counter and prescription medicines only as told by your health care provider.  Use techniques to help pop your ears as recommended by your health care provider. These may include: ? Chewing gum. ? Yawning. ? Frequent, forceful swallowing. ? Closing your mouth, holding your nose closed, and gently blowing as if you are trying to blow air out of your nose.  Do not do any of the following until your health care provider approves: ? Travel to high altitudes. ? Fly in airplanes. ? Work in a pressurized cabin or room. ? Scuba dive.  Keep your ears dry. Dry your ears completely after showering or bathing.  Do not smoke.  Keep all follow-up visits as told by your health care provider. This is important. Contact a health care provider if:  Your symptoms do not go away after treatment.  Your symptoms come back after treatment.  You are   unable to pop your ears.  You have: ? A fever. ? Pain in your ear. ? Pain in your head or neck. ? Fluid draining from your ear.  Your hearing suddenly changes.  You become very dizzy.  You lose your balance. This information is not intended to replace advice given to you by your health care provider. Make sure you discuss any questions you have with your health care provider. Document Released: 04/30/2015 Document Revised: 09/09/2015  Document Reviewed: 04/22/2014 Elsevier Interactive Patient Education  2018 [employer]  Viral Respiratory Infection A respiratory infection is an illness that affects part of the respiratory system, such as the lungs, nose, or throat. Most respiratory infections are caused by either viruses or bacteria. A respiratory infection that is caused by a virus is called a viral respiratory infection. Common types of viral respiratory infections include:  A cold.  The flu (influenza).  A respiratory syncytial virus (RSV) infection.  How do I know if I have a viral respiratory infection? Most viral respiratory infections cause:  A stuffy or runny nose.  Yellow or green nasal discharge.  A cough.  Sneezing.  Fatigue.  Achy muscles.  A sore throat.  Sweating or chills.  A fever.  A headache.  How are viral respiratory infections treated? If influenza is diagnosed early, it may be treated with an antiviral medicine that shortens the length of time a person has symptoms. Symptoms of viral respiratory infections may be treated with over-the-counter and prescription medicines, such as:  Expectorants. These make it easier to cough up mucus.  Decongestant nasal sprays.  Health care providers do not prescribe antibiotic medicines for viral infections. This is because antibiotics are designed to kill bacteria. They have no effect on viruses. How do I know if I should stay home from work or school? To avoid exposing others to your respiratory infection, stay home if you have:  A fever.  A persistent cough.  A sore throat.  A runny nose.  Sneezing.  Muscles aches.  Headaches.  Fatigue.  Weakness.  Chills.  Sweating.  Nausea.  Follow these instructions at home:  Rest as much as possible.  Take over-the-counter and prescription medicines only as told by your health care provider.  Drink enough fluid to keep your urine clear or pale yellow. This helps prevent  dehydration and helps loosen up mucus.  Gargle with a salt-water mixture 3-4 times per day or as needed. To make a salt-water mixture, completely dissolve -1 tsp of salt in 1 cup of warm water.  Use nose drops made from salt water to ease congestion and soften raw skin around your nose.  Do not drink alcohol.  Do not use tobacco products, including cigarettes, chewing tobacco, and e-cigarettes. If you need help quitting, ask your health care provider. Contact a health care provider if:  Your symptoms last for 10 days or longer.  Your symptoms get worse over time.  You have a fever.  You have severe sinus pain in your face or forehead.  The glands in your jaw or neck become very swollen. Get help right away if:  You feel pain or pressure in your chest.  You have shortness of breath.  You faint or feel like you will faint.  You have severe and persistent vomiting.  You feel confused or disoriented. This information is not intended to replace advice given to you by your health care provider. Make sure you discuss any questions you have with your health care  provider. Document Released: 01/11/2005 Document Revised: 09/09/2015 Document Reviewed: 09/09/2014 Elsevier Interactive Patient Education  [employer].

## 2018-03-18 NOTE — Progress Notes (Addendum)
Established Patient Office Visit  Subjective:  Patient ID: [name], female    DOB: 03-Sep-1980  Age: 37 y.o. MRN: [id number]  CC:  Chief Complaint  Patient presents with  . cough/congestion    HPI [name] presents for evaluation of cough and congestion that has been ongoing for 2 days. It first started on Saturday. She did have a fever yesterday, but it never went above 100. She does not have a fever currently and has not tried any otc medications. She has been taking Tylenol as needed.  Patient is had nasal congestion ear congestion.  She denies wheezing or reactive airway disease.  There is been no facial pressure teeth pain.  Cough is been nonproductive.  Past Medical History:  Diagnosis Date  . Abnormal Pap smear   . Anxiety   . Anxiety   . Cough    HAS COLD. PRODUCTIVE CLEAR. NO FEVER. NO WHEEZING. TO SEE PCP IF WORSENS  . Endometriosis   . HA (headache)    otc med prn  . Hyperlipidemia    diet controlled, no meds  . Hypertension   . PONV (postoperative nausea and vomiting)   . Pregnancy induced hypertension 2008    Past Surgical History:  Procedure Laterality Date  . ABDOMINAL HYSTERECTOMY    . ABLATION ON ENDOMETRIOSIS N/A 06/04/2014   Procedure: ABLATION ON ENDOMETRIOSIS;  Surgeon: Leslie Andrea, MD;  Location: [facility];  Service: Gynecology;  Laterality: N/A;  . BIOPSY  12/01/2011   Procedure: BIOPSY;  Surgeon: Leslie Andrea, MD;  Location: [facility];  Service: Gynecology;  Laterality: N/A;  . CESAREAN SECTION  2008  . CESAREAN SECTION WITH BILATERAL TUBAL LIGATION N/A 12/02/2012   Procedure: Repeat Cesarean Section Delivery Baby Girl @ 1826, Apgars 8/9, Bilateral Tubal Ligation;  Surgeon: Leslie Andrea, MD;  Location: [facility];  Service: Obstetrics;  Laterality: N/A;  . COLPOSCOPY     as teenager, results WNL  . ENDOMETRIAL BIOPSY    . LAPAROSCOPIC ASSISTED VAGINAL HYSTERECTOMY  2012   with bilateral salpingectomy  . LAPAROSCOPIC  BILATERAL SALPINGECTOMY Bilateral 02/15/2015   Procedure: LAPAROSCOPIC BILATERAL SALPINGECTOMY;  Surgeon: Hildred Laser, MD;  Location: [facility];  Service: Gynecology;  Laterality: Bilateral;  . LAPAROSCOPIC LYSIS OF ADHESIONS N/A 06/04/2014   Procedure: LAPAROSCOPIC LYSIS OF ADHESIONS;  Surgeon: Leslie Andrea, MD;  Location: [facility];  Service: Gynecology;  Laterality: N/A;  . LAPAROSCOPIC SALPINGO OOPHERECTOMY Right 07/17/2016   Procedure: LAPAROSCOPIC RIGHT SALPINGO OOPHORECTOMY;  Surgeon: Hildred Laser, MD;  Location: [facility];  Service: Gynecology;  Laterality: Right;  . LAPAROSCOPY  12/01/2011   Procedure: LAPAROSCOPY OPERATIVE;  Surgeon: Leslie Andrea, MD;  Location: [facility];  Service: Gynecology;  Laterality: N/A;  . LAPAROSCOPY N/A 06/04/2014   Procedure: LAPAROSCOPY OPERATIVE;  Surgeon: Leslie Andrea, MD;  Location: [facility];  Service: Gynecology;  Laterality: N/A;  . LAPAROSCOPY N/A 02/14/2016   Procedure: LAPAROSCOPY DIAGNOSTIC WITH BIOPSIES POSSIBLE ADHESIOLYSIS;  Surgeon: Hildred Laser, MD;  Location: [facility];  Service: Gynecology;  Laterality: N/A;  . LAPAROSCOPY N/A 05/26/2016   Procedure: LAPAROSCOPY operative with lysis of adhesions;  Surgeon: Linzie Collin, MD;  Location: [facility];  Service: Gynecology;  Laterality: N/A;  . OOPHORECTOMY Left   . SALPINGECTOMY Bilateral   . TONSILLECTOMY    . TONSILLECTOMY    . TUBAL LIGATION    . TYMPANOSTOMY TUBE PLACEMENT      Family History  Problem Relation Age of Onset  . Hypertension Father   . Coronary artery disease Paternal Uncle   . Cancer Paternal Grandfather   . Hypertension Mother   . Breast cancer Paternal Aunt   . Ovarian cancer Maternal Grandmother   . Diabetes Neg Hx     Social History   Socioeconomic History  . Marital status: Married    Spouse name: Not on file  . Number of children: 1  . Years of education: Not on file  . Highest education level: Not on file  Occupational History  . 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 Occupation: Engineer, drilling: [employer]     Comment: full time  Social Needs  . Financial resource strain: Not on file  . Food insecurity:    Worry: Not on file    Inability: Not on file  . Transportation needs:    Medical: Not on file    Non-medical: Not on file  Tobacco Use  . Smoking status: Never Smoker  . Smokeless tobacco: Never Used  Substance and Sexual Activity  . Alcohol use: No    Frequency: Never    Comment: occasional  . Drug use: No  . Sexual activity: Yes    Birth control/protection: Surgical  Lifestyle  . Physical activity:    Days per week: Not on file    Minutes per session: Not on file  . Stress: Not on file  Relationships  . Social connections:    Talks on phone: Not on file    Gets together: Not on file    Attends religious service: Not on file    Active member of club or organization: Not on file    Attends meetings of clubs or organizations: Not on file    Relationship status: Not on file  . Intimate partner violence:    Fear of current or ex partner: Not on file    Emotionally abused: Not on file    Physically abused: Not on file    Forced sexual activity: Not on file  Other Topics Concern  . Not on file  Social History Narrative  . Not on file    Outpatient Medications Prior to Visit  Medication Sig Dispense Refill  . amLODipine (NORVASC) 10 MG tablet Take 1 tablet (10 mg total) by mouth daily. 90 tablet 3  . gabapentin (NEURONTIN) 300 MG capsule take 3 capsules by mouth 3x daily or as directed 180 capsule 0  . losartan-hydrochlorothiazide (HYZAAR) 100-25 MG tablet Take 1 tablet by mouth daily. 90 tablet 1  . metoprolol succinate (TOPROL-XL) 50 MG 24 hr tablet Take 50 mg by mouth daily.  3  . norethindrone (AYGESTIN) 5 MG tablet Take 1 tablet (5 mg total) by mouth daily. 30 tablet 6  . PARoxetine (PAXIL) 10 MG tablet Take 1 tablet (10 mg  total) by mouth daily. 30 tablet 3  . zolpidem (AMBIEN) 5 MG tablet Take 1 tablet (5 mg total) by mouth once for 1 dose. 30 tablet 0  . nitrofurantoin, macrocrystal-monohydrate, (MACROBID) 100 MG capsule Take 1 capsule (100 mg total) by mouth 2 (two) times daily. (Patient not taking: Reported on 03/03/2018) 14 capsule 0  . traMADol (ULTRAM) 50 MG tablet Take 2 tablets (100 mg total) by mouth every 6 (six) hours as needed. 90 tablet 2   No facility-administered medications prior to visit.     Allergies  Allergen Reactions  . Sulfamethoxazole-Trimethoprim Swelling and Rash  . Amoxil [Amoxicillin] Rash     Has tolerated augmentin!!  ? If had viral resh with amox.  . Azithromycin Rash  . Cefprozil Rash  . Ceftin [Cefuroxime Axetil] Rash  . Other Itching    CHG-wipes she had to use prier to surgery.  Ok to use CHG wash without problems CHG-wipes she had to use prier to surgery.  Ok to use CHG wash without problems  . Penicillins Rash    Has patient had a PCN reaction causing immediate rash, facial/tongue/throat swelling, SOB or lightheadedness with hypotension: Yes Has patient had a PCN reaction causing severe rash involving mucus membranes or skin necrosis: No Has patient had a PCN reaction that required hospitalization No Has patient had a PCN reaction occurring within the last 10 years: No If all of the above answers are "NO", then may proceed with Cephalosporin use.   . Sulfa Antibiotics Rash    ROS Review of Systems  Constitutional: Negative for diaphoresis, fatigue, fever and unexpected weight change.  HENT: Positive for hearing loss. Negative for ear discharge, ear pain, sinus pressure, sinus pain, sneezing, sore throat and trouble swallowing.   Eyes: Negative for photophobia and visual disturbance.  Respiratory: Positive for cough. Negative for shortness of breath and wheezing.   Cardiovascular: Negative.   Gastrointestinal: Negative.   Endocrine: Negative for polyphagia and  polyuria.  Genitourinary: Negative.   Musculoskeletal: Negative for arthralgias and myalgias.  Skin: Negative for pallor.  Allergic/Immunologic: Negative for immunocompromised state.  Neurological: Positive for headaches. Negative for light-headedness and numbness.  Hematological: Does not bruise/bleed easily.  Psychiatric/Behavioral: Negative.       Objective:    Physical Exam  Constitutional: She is oriented to person, place, and time. She appears well-developed and well-nourished. No distress.  HENT:  Head: Normocephalic and atraumatic.  Right Ear: External ear normal. Tympanic membrane is retracted. Tympanic membrane is not injected, not perforated and not erythematous.  Left Ear: External ear normal. Tympanic membrane is retracted. Tympanic membrane is not injected, not perforated and not erythematous.  Nose: Nose normal.  Mouth/Throat: Oropharynx is clear and moist. No oropharyngeal exudate.  Eyes: Pupils are equal, round, and reactive to light. Conjunctivae are normal. Right eye exhibits no discharge. Left eye exhibits no discharge. No scleral icterus.  Neck: Neck supple. No JVD present. No tracheal deviation present. No thyromegaly present.  Cardiovascular: Normal rate, regular rhythm and normal heart sounds.  Pulmonary/Chest: Effort normal and breath sounds normal. No stridor.  Abdominal: Bowel sounds are normal.  Lymphadenopathy:    She has no cervical adenopathy.  Neurological: She is alert and oriented to person, place, and time.  Skin: Skin is warm and dry. She is not diaphoretic.  Psychiatric: She has a normal mood and affect. Her behavior is normal.    BP 126/80   Pulse (!) 108   Temp 98.4 F (36.9 C) (Oral)   Ht 5\' 5"  (1.651 m)   Wt 219 lb 4 oz (99.5 kg)   LMP 10/15/2013   SpO2 97%   BMI 36.49 kg/m  Wt Readings from Last 3 Encounters:  03/18/18 219 lb 4 oz (99.5 kg)  03/03/18 223 lb (101.2 kg)  02/11/18 223 lb (101.2 kg)   BP Readings from Last 3  Encounters:  03/18/18 126/80  03/03/18 (!) 146/90  02/11/18 (!) 140/110   Health Maintenance Due  Topic Date Due  . TETANUS/TDAP  04/18/2011  . PAP SMEAR  05/25/2017  There are no preventive care reminders to display for this patient.  Lab Results  Component Value Date   TSH 1.71 08/25/2015   Lab Results  Component Value Date   WBC 4.7 03/03/2018   HGB 13.1 03/03/2018   HCT 39.1 03/03/2018   MCV 89.3 03/03/2018   PLT 353 03/03/2018   Lab Results  Component Value Date   NA 137 03/03/2018   K 3.2 (L) 03/03/2018   CO2 29 03/03/2018   GLUCOSE 136 (H) 03/03/2018   BUN 12 03/03/2018   CREATININE 0.62 03/03/2018   BILITOT 0.8 08/20/2015   ALKPHOS 42 08/20/2015   AST 17 08/20/2015   ALT 19 08/20/2015   PROT 8.6 (H) 08/20/2015   ALBUMIN 5.0 08/20/2015   CALCIUM 9.2 03/03/2018   ANIONGAP 6 03/03/2018   GFR 90.09 08/20/2015   Lab Results  Component Value Date   CHOL 253 (H) 08/20/2015   Lab Results  Component Value Date   HDL 47.10 08/20/2015   No results found for: [facility] Lab Results  Component Value Date   TRIG 270.0 (H) 08/20/2015   Lab Results  Component Value Date   CHOLHDL 5 08/20/2015   No results found for: HGBA1C    Assessment & Plan:   Problem List Items Addressed This Visit    None    Visit Diagnoses    Viral syndrome    -  Primary   Relevant Medications   HYDROcodone-homatropine (HYCODAN) 5-1.5 MG/5ML syrup   Dysfunction of left eustachian tube       Relevant Medications   predniSONE (DELTASONE) 10 MG tablet      Meds ordered this encounter  Medications  . predniSONE (DELTASONE) 10 MG tablet    Sig: Take 1 tablet (10 mg total) by mouth 2 (two) times daily with a meal for 5 days.    Dispense:  10 tablet    Refill:  0  . DISCONTD: HYDROcodone-homatropine (HYCODAN) 5-1.5 MG/5ML syrup    Sig: Take 5 mLs by mouth every 6 (six) hours as needed for cough. For night use only.    Dispense:  120 mL    Refill:  0  .  HYDROcodone-homatropine (HYCODAN) 5-1.5 MG/5ML syrup    Sig: Take 5 mLs by mouth every 6 (six) hours as needed for cough. For night use only.    Dispense:  120 mL    Refill:  0    Follow-up: Return in about 1 week (around 03/25/2018), or if symptoms worsen or fail to improve.   Patient was given anticipatory guidance on a viral respiratory tract infection and eustachian tube dysfunction.  She is willing to try low-dose prednisone to help open up her ears.  She will use saltwater nasal spray for nasal congestion.  She will follow-up in 1 week if not improving by that time.

## 2018-03-18 NOTE — Addendum Note (Signed)
Addended by: Nadene RubinsKREMER, Kynan Peasley A on: 03/18/2018 11:09 AM   Modules accepted: Orders

## 2018-03-22 ENCOUNTER — Ambulatory Visit: Payer: BLUE CROSS/BLUE SHIELD | Admitting: Family Medicine

## 2018-03-26 ENCOUNTER — Other Ambulatory Visit: Payer: Self-pay | Admitting: Obstetrics and Gynecology

## 2018-03-28 ENCOUNTER — Other Ambulatory Visit: Payer: Self-pay | Admitting: Obstetrics and Gynecology

## 2018-03-28 MED ORDER — TRAMADOL HCL 50 MG PO TABS: 100.0000 mg | ORAL_TABLET | Freq: Four times a day (QID) | ORAL | 3 refills | Status: DC | PRN

## 2018-04-12 ENCOUNTER — Encounter: Payer: BLUE CROSS/BLUE SHIELD | Admitting: Obstetrics and Gynecology

## 2018-05-07 ENCOUNTER — Other Ambulatory Visit: Payer: Self-pay | Admitting: Obstetrics and Gynecology

## 2018-05-08 ENCOUNTER — Other Ambulatory Visit: Payer: Self-pay | Admitting: Obstetrics and Gynecology

## 2018-05-08 MED ORDER — NORETHINDRONE ACETATE 5 MG PO TABS: 5.0000 mg | ORAL_TABLET | Freq: Every day | ORAL | 6 refills | Status: DC

## 2018-05-09 MED ORDER — DANAZOL 200 MG PO CAPS: 200.0000 mg | ORAL_CAPSULE | Freq: Two times a day (BID) | ORAL | 6 refills | Status: DC

## 2018-05-23 ENCOUNTER — Ambulatory Visit: Payer: BLUE CROSS/BLUE SHIELD | Admitting: Family Medicine

## 2018-05-23 ENCOUNTER — Encounter: Payer: Self-pay | Admitting: Family Medicine

## 2018-05-23 ENCOUNTER — Ambulatory Visit (INDEPENDENT_AMBULATORY_CARE_PROVIDER_SITE_OTHER): Payer: BLUE CROSS/BLUE SHIELD | Admitting: Family Medicine

## 2018-05-23 VITALS — BP 140/80 | HR 84 | Temp 99.0°F | Ht 65.0 in | Wt 217.5 lb

## 2018-05-23 DIAGNOSIS — J00 Acute nasopharyngitis [common cold]: Secondary | ICD-10-CM

## 2018-05-23 MED ORDER — DOXYCYCLINE HYCLATE 100 MG PO TABS: 100.0000 mg | ORAL_TABLET | Freq: Two times a day (BID) | ORAL | 0 refills | Status: AC

## 2018-05-23 MED ORDER — HYDROCODONE-HOMATROPINE 5-1.5 MG/5ML PO SYRP: 5.0000 mL | ORAL_SOLUTION | Freq: Three times a day (TID) | ORAL | 0 refills | Status: DC | PRN

## 2018-05-23 NOTE — Patient Instructions (Signed)
Based on your symptoms, it looks like you have a virus.   Antibiotics are not need for a viral infection but the following will help:   1. Drink plenty of fluids 2. Get lots of rest  Sinus Congestion 1) Neti Pot (Saline rinse) -- 2 times day -- if tolerated 2) Flonase (Store Brand ok) - once daily 3) Over the counter congestion medications  Cough 1) Cough drops can be helpful 2) Nyquil (or nighttime cough medication) 3) Honey is proven to be one of the best cough medications   Sore Throat 1) Honey as above, cough drops 2) Ibuprofen or Aleve can be helpful 3) Salt water Gargles  If you develop fevers (Temperature >100.4), chills, worsening symptoms or symptoms lasting longer than 10 days return to clinic.     If not improved in 2-3 days - start the antibiotic

## 2018-05-23 NOTE — Progress Notes (Signed)
Subjective:     [name] is a 38 y.o. female presenting for Cough (x 3 to 4 days. Headaches, head congestion, cough mainly dry and worse at night, post nasal drainage, runny nose, sneezing, scratchy throat, ear pain, diarrhea. Has taking Tessallon Perles and taking DayQuil. No fever, no body aches.)      URI   This is a new problem. The current episode started in the past 7 days. There has been no fever. Associated symptoms include congestion, coughing (worse at night), diarrhea, ear pain, headaches, rhinorrhea and a sore throat. Pertinent negatives include no abdominal pain, nausea, sinus pain or vomiting. Treatments tried: Tessalon pearls, dayquil.   Diarrhea - anything she eats seems to trigger diarrhea A lot people sick at work Someone last week with similar symptoms on abx   Review of Systems  HENT: Positive for congestion, ear pain, postnasal drip, rhinorrhea, sinus pressure and sore throat. Negative for sinus pain.   Respiratory: Positive for cough (worse at night).   Gastrointestinal: Positive for diarrhea. Negative for abdominal pain, nausea and vomiting.  Musculoskeletal: Negative for arthralgias and myalgias.  Neurological: Positive for headaches.     03/18/2018: Viral syndrome- cough medication and prednisone  Social History   Tobacco Use  Smoking Status Never Smoker  Smokeless Tobacco Never Used        Objective:    BP Readings from Last 3 Encounters:  05/23/18 140/80  03/18/18 126/80  03/03/18 (!) 146/90   Wt Readings from Last 3 Encounters:  05/23/18 217 lb 8 oz (98.7 kg)  03/18/18 219 lb 4 oz (99.5 kg)  03/03/18 223 lb (101.2 kg)    BP 140/80   Pulse 84   Temp 99 F (37.2 C)   Ht 5\' 5"  (1.651 m)   Wt 217 lb 8 oz (98.7 kg)   LMP 10/15/2013   SpO2 99%   BMI 36.19 kg/m    Physical Exam Constitutional:      General: She is not in acute distress.    Appearance: She is well-developed. She is not diaphoretic.  HENT:     Head:  Normocephalic and atraumatic.     Right Ear: Tympanic membrane and ear canal normal.     Left Ear: Tympanic membrane and ear canal normal.     Nose: Mucosal edema and rhinorrhea present.     Right Sinus: Frontal sinus tenderness present. No maxillary sinus tenderness.     Left Sinus: Maxillary sinus tenderness and frontal sinus tenderness present.     Mouth/Throat:     Pharynx: Uvula midline. Posterior oropharyngeal erythema present. No oropharyngeal exudate.     Tonsils: Swelling: 0 on the right. 0 on the left.  Eyes:     General: No scleral icterus.    Conjunctiva/sclera: Conjunctivae normal.  Neck:     Musculoskeletal: Neck supple.  Cardiovascular:     Rate and Rhythm: Normal rate and regular rhythm.     Heart sounds: Normal heart sounds. No murmur.  Pulmonary:     Effort: Pulmonary effort is normal. No respiratory distress.     Breath sounds: Normal breath sounds.  Lymphadenopathy:     Cervical: No cervical adenopathy.  Skin:    General: Skin is warm and dry.     Capillary Refill: Capillary refill takes less than 2 seconds.  Neurological:     Mental Status: She is alert.           Assessment & Plan:   Problem List Items Addressed  This Visit    None    Visit Diagnoses    Acute nasopharyngitis    -  Primary   Relevant Medications   doxycycline (VIBRA-TABS) 100 MG tablet   HYDROcodone-homatropine (HYCODAN) 5-1.5 MG/5ML syrup     Given duration of symptoms suspect viral. However, given Abx to take if not improving after 2-3 days   Return if symptoms worsen or fail to improve.  Lynnda ChildJessica R , MD

## 2018-05-27 ENCOUNTER — Encounter: Payer: BLUE CROSS/BLUE SHIELD | Admitting: Family Medicine

## 2018-05-27 ENCOUNTER — Encounter: Payer: Self-pay | Admitting: Sports Medicine

## 2018-05-28 ENCOUNTER — Telehealth: Payer: Self-pay | Admitting: Family Medicine

## 2018-05-28 NOTE — Telephone Encounter (Signed)
Best number [phone] Pt would like to transfer care from dr rigby to dr copland for sport med Is it ok to schedule??  Closer to home

## 2018-05-28 NOTE — Telephone Encounter (Signed)
Appointment 2/12 Pt aware 

## 2018-05-28 NOTE — Telephone Encounter (Signed)
Looks like I can see her tomorrow

## 2018-05-28 NOTE — Telephone Encounter (Signed)
Generally, you all don't need to ask. Marja Kays, and I have had patients move around based on where they live, work, etc.  Not the same thing as a PCP change.  From my standpoint, I am always taking sports consults from anyone who asks.

## 2018-05-28 NOTE — Telephone Encounter (Signed)
Last OV 12/13/2016 Next OV 06/11/2018 Last refill of Gabapentin 02/05/18 #180/0 Last inj with Dr. Alvester Morin 01/24/2017.

## 2018-05-29 ENCOUNTER — Ambulatory Visit (INDEPENDENT_AMBULATORY_CARE_PROVIDER_SITE_OTHER): Payer: BLUE CROSS/BLUE SHIELD | Admitting: Family Medicine

## 2018-05-29 ENCOUNTER — Encounter: Payer: BLUE CROSS/BLUE SHIELD | Admitting: Family Medicine

## 2018-05-29 ENCOUNTER — Encounter: Payer: Self-pay | Admitting: Family Medicine

## 2018-05-29 VITALS — BP 140/90 | HR 85 | Temp 98.4°F | Ht 65.0 in | Wt 220.5 lb

## 2018-05-29 DIAGNOSIS — E669 Obesity, unspecified: Secondary | ICD-10-CM

## 2018-05-29 DIAGNOSIS — G8929 Other chronic pain: Secondary | ICD-10-CM | POA: Diagnosis not present

## 2018-05-29 DIAGNOSIS — M533 Sacrococcygeal disorders, not elsewhere classified: Secondary | ICD-10-CM | POA: Diagnosis not present

## 2018-05-29 DIAGNOSIS — M5442 Lumbago with sciatica, left side: Secondary | ICD-10-CM

## 2018-05-29 MED ORDER — GABAPENTIN 300 MG PO CAPS: 300.0000 mg | ORAL_CAPSULE | Freq: Three times a day (TID) | ORAL | 1 refills | Status: DC

## 2018-05-29 MED ORDER — CYCLOBENZAPRINE HCL 10 MG PO TABS: 10.0000 mg | ORAL_TABLET | Freq: Every evening | ORAL | 3 refills | Status: DC | PRN

## 2018-05-29 MED ORDER — PREDNISONE 20 MG PO TABS: ORAL_TABLET | ORAL | 0 refills | Status: DC

## 2018-05-29 NOTE — Progress Notes (Signed)
Dr. Karleen HampshireSpencer T. Gary Gabrielsen, MD, CAQ [facility] [address] [place], [id number] Phone: [phone] Fax: [phone]  05/29/2018  Patient: Terri MaladyJennifer L Cobarrubias, MRN: [id number], DOB: Mar 30, 1981, 38 y.o.  Primary Physician:  Emi BelfastGessner, Deborah B, FNP   Chief Complaint  Patient presents with  . Follow-up    Back Pain-Transferred from Dr. Berline Choughigby   Subjective:   Terri MaladyJennifer L Pritts is a 38 y.o. very pleasant female patient who presents with the following:  She is a pleasant patient who previously was seeing Dr. Berline Choughigby who lives near our office and has also seen Dr. Alvester MorinNewton in the past.  Prior MRI of the lumbar spine from 12/2016 reviewed again today.  Most notable for L 4-5 disc bulging with L4 foraminal stenosis.   She previously was on Gabapentin, and she also had some prior transforaminal injections from Dr. Alvester MorinNewton. Also various muscle relaxants. The injections lasted more than a year.   Had some lower BP worsening over the last couple of weeks.  No radicular pain, no numbness.  She has some fairly diffuse pain that localizes to the lumbar spine as well as in the upper buttocks region.  Currently she is not having any radicular pain, and she is not having any numbness or tingling.  She denies any saddle anesthesia or bowel or bladder incontinence.  Never done PT for back.  She recently got a gym membership Berline ChoughRigby did give some rehab to do.  She was doing that originally, but she has not done any recently.  Primary activity outside of work is some Western pleasure horseback riding.  She is never had any spine surgery.  Past Medical History, Surgical History, Social History, Family History, Problem List, Medications, and Allergies have been reviewed and updated if relevant.  Patient Active Problem List   Diagnosis Date Noted  . Osteoarthritis of spine with radiculopathy, lumbar region 06/20/2016  . H/O abdominal surgery 02/09/2016  . Chronic  pelvic pain in female 11/30/2014  . Endometriosis 11/30/2014  . Essential (primary) hypertension 09/01/2014  . Cyst of ovary 09/01/2014  . Class 2 obesity 09/01/2014  . Anxiety state, unspecified 12/11/2013  . Routine general medical examination at a health care facility 04/06/2011  . Metabolic syndrome 04/06/2011  . Insomnia 12/20/2007  . ALLERGIC RHINITIS 07/18/2007  . HYPERLIPIDEMIA 02/19/2007    Past Medical History:  Diagnosis Date  . Abnormal Pap smear   . Anxiety   . Anxiety   . Cough    HAS COLD. PRODUCTIVE CLEAR. NO FEVER. NO WHEEZING. TO SEE PCP IF WORSENS  . Endometriosis   . HA (headache)    otc med prn  . Hyperlipidemia    diet controlled, no meds  . Hypertension   . PONV (postoperative nausea and vomiting)   . Pregnancy induced hypertension 2008    Past Surgical History:  Procedure Laterality Date  . ABDOMINAL HYSTERECTOMY    . ABLATION ON ENDOMETRIOSIS N/A 06/04/2014   Procedure: ABLATION ON ENDOMETRIOSIS;  Surgeon: Leslie AndreaJames E Tomblin II, MD;  Location: [facility];  Service: Gynecology;  Laterality: N/A;  . BIOPSY  12/01/2011   Procedure: BIOPSY;  Surgeon: Leslie AndreaJames E Tomblin II, MD;  Location: [facility];  Service: Gynecology;  Laterality: N/A;  . CESAREAN SECTION  2008  . CESAREAN SECTION WITH BILATERAL TUBAL LIGATION N/A 12/02/2012   Procedure: Repeat Cesarean Section Delivery Baby Girl @ 1826, Apgars 8/9, Bilateral Tubal Ligation;  Surgeon: Leslie AndreaJames E Tomblin II, MD;  Location:  [facility];  Service: Obstetrics;  Laterality: N/A;  . COLPOSCOPY     as teenager, results WNL  . ENDOMETRIAL BIOPSY    . LAPAROSCOPIC ASSISTED VAGINAL HYSTERECTOMY  2012   with bilateral salpingectomy  . LAPAROSCOPIC BILATERAL SALPINGECTOMY Bilateral 02/15/2015   Procedure: LAPAROSCOPIC BILATERAL SALPINGECTOMY;  Surgeon: Hildred Laser, MD;  Location: [facility];  Service: Gynecology;  Laterality: Bilateral;  . LAPAROSCOPIC LYSIS OF ADHESIONS N/A 06/04/2014   Procedure: LAPAROSCOPIC LYSIS OF ADHESIONS;   Surgeon: Leslie Andrea, MD;  Location: [facility];  Service: Gynecology;  Laterality: N/A;  . LAPAROSCOPIC SALPINGO OOPHERECTOMY Right 07/17/2016   Procedure: LAPAROSCOPIC RIGHT SALPINGO OOPHORECTOMY;  Surgeon: Hildred Laser, MD;  Location: [facility];  Service: Gynecology;  Laterality: Right;  . LAPAROSCOPY  12/01/2011   Procedure: LAPAROSCOPY OPERATIVE;  Surgeon: Leslie Andrea, MD;  Location: [facility];  Service: Gynecology;  Laterality: N/A;  . LAPAROSCOPY N/A 06/04/2014   Procedure: LAPAROSCOPY OPERATIVE;  Surgeon: Leslie Andrea, MD;  Location: [facility];  Service: Gynecology;  Laterality: N/A;  . LAPAROSCOPY N/A 02/14/2016   Procedure: LAPAROSCOPY DIAGNOSTIC WITH BIOPSIES POSSIBLE ADHESIOLYSIS;  Surgeon: Hildred Laser, MD;  Location: [facility];  Service: Gynecology;  Laterality: N/A;  . LAPAROSCOPY N/A 05/26/2016   Procedure: LAPAROSCOPY operative with lysis of adhesions;  Surgeon: Linzie Collin, MD;  Location: [facility];  Service: Gynecology;  Laterality: N/A;  . OOPHORECTOMY Left   . SALPINGECTOMY Bilateral   . TONSILLECTOMY    . TONSILLECTOMY    . TUBAL LIGATION    . TYMPANOSTOMY TUBE PLACEMENT      Social History   Socioeconomic History  . Marital status: Married    Spouse name: Not on file  . Number of children: 1  . Years of education: Not on file  . Highest education level: Not on file  Occupational History  . Occupation: Civil engineer, contracting: [employer]    Comment: only once in a while  . 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 Financial resource strain: Not on file  . Food insecurity:    Worry: Not on file    Inability: Not on file  . Transportation needs:    Medical: Not on file    Non-medical: Not on file  Tobacco Use  . Smoking status: Never Smoker  . Smokeless tobacco: Never Used  Substance and Sexual Activity  . Alcohol use: No    Frequency: Never    Comment: occasional  . Drug use: No   . Sexual activity: Yes    Birth control/protection: Surgical  Lifestyle  . Physical activity:    Days per week: Not on file    Minutes per session: Not on file  . Stress: Not on file  Relationships  . Social connections:    Talks on phone: Not on file    Gets together: Not on file    Attends religious service: Not on file    Active member of club or organization: Not on file    Attends meetings of clubs or organizations: Not on file    Relationship status: Not on file  . Intimate partner violence:    Fear of current or ex partner: Not on file    Emotionally abused: Not on file    Physically abused: Not on file    Forced sexual activity: Not on file  Other Topics Concern  .  Not on file  Social History Narrative  . Not on file    Family History  Problem Relation Age of Onset  . Hypertension Father   . Coronary artery disease Paternal Uncle   . Cancer Paternal Grandfather   . Hypertension Mother   . Breast cancer Paternal Aunt   . Ovarian cancer Maternal Grandmother   . Diabetes Neg Hx     Allergies  Allergen Reactions  . Sulfamethoxazole-Trimethoprim Swelling and Rash  . Amoxil [Amoxicillin] Rash     Has tolerated augmentin!!  ? If had viral resh with amox.  . Azithromycin Rash  . Cefprozil Rash  . Ceftin [Cefuroxime Axetil] Rash  . Other Itching    CHG-wipes she had to use prier to surgery.  Ok to use CHG wash without problems CHG-wipes she had to use prier to surgery.  Ok to use CHG wash without problems  . Penicillins Rash    Has patient had a PCN reaction causing immediate rash, facial/tongue/throat swelling, SOB or lightheadedness with hypotension: Yes Has patient had a PCN reaction causing severe rash involving mucus membranes or skin necrosis: No Has patient had a PCN reaction that required hospitalization No Has patient had a PCN reaction occurring within the last 10 years: No If all of the above answers are "NO", then may proceed with Cephalosporin  use.   . Sulfa Antibiotics Rash    Medication list reviewed and updated in full in [facility].  GEN: no acute illness or fever CV: No chest pain or shortness of breath MSK: detailed above Neuro: neurological signs are described above ROS O/w per HPI  Objective:   BP 140/90   Pulse 85   Temp 98.4 F (36.9 C) (Oral)   Ht 5\' 5"  (1.651 m)   Wt 220 lb 8 oz (100 kg)   LMP 10/15/2013   BMI 36.69 kg/m    GEN: Well-developed,well-nourished,in no acute distress; alert,appropriate and cooperative throughout examination HEENT: Normocephalic and atraumatic without obvious abnormalities. Ears, externally no deformities PULM: Breathing comfortably in no respiratory distress EXT: No clubbing, cyanosis, or edema PSYCH: Normally interactive. Cooperative during the interview. Pleasant. Friendly and conversant. Not anxious or depressed appearing. Normal, full affect.  Range of motion at  the waist: Flexion: normal Extension: normal Lateral bending: normal Rotation: all normal  No echymosis or edema Rises to examination table with no difficulty Gait: non antalgic  Inspection/Deformity: N Paraspinus Tenderness: l2-s1 mild ttp  B Ankle Dorsiflexion (L5,4): 5/5 B Great Toe Dorsiflexion (L5,4): 5/5 Heel Walk (L5): WNL Toe Walk (S1): WNL Rise/Squat (L4): WNL  SENSORY B Medial Foot (L4): WNL B Dorsum (L5): WNL B Lateral (S1): WNL Light Touch: WNL Pinprick: WNL  REFLEXES Knee (L4): 2+ Ankle (S1): 2+  B SLR, seated: neg B SLR, supine: neg B FABER: neg B Reverse FABER: mildly tender b B Greater Troch: mildly tender B Log Roll: neg B Stork: NT B Sciatic Notch: ttp bilaterally  Radiology: CLINICAL DATA:  Initial evaluation for low back pain radiating into legs bilaterally, left greater than right for 6-8 months.  EXAM: MRI LUMBAR SPINE WITHOUT CONTRAST  TECHNIQUE: Multiplanar, multisequence MR imaging of the lumbar spine was performed. No intravenous contrast  was administered.  COMPARISON:  Prior radiograph from 06/20/2016.  FINDINGS: Segmentation: Normal segmentation. Lowest well-formed disc labeled the L5-S1 level.  Alignment: Vertebral bodies normally aligned with preservation of the normal lumbar lordosis. No listhesis.  Vertebrae: Vertebral body heights well maintained. No evidence for acute or chronic fracture.  Signal intensity within the visualized bone marrow is normal. No worrisome osseous lesions. No abnormal marrow edema.  Conus medullaris: Extends to the L1-2 level and appears normal.  Paraspinal and other soft tissues: Paraspinous soft tissues within normal limits. Visualized visceral structures are normal.  Disc levels:  No significant degenerative changes are seen through the L3-4 level.  L4-5: Mild diffuse disc bulge with disc desiccation. Superimposed shallow right foraminal disc protrusion with associated annular fissure (series 7, image 23). Protruding disc closely approximates the exiting right L4 nerve root without neural impingement. No canal or subarticular stenosis. Mild bilateral L4 foraminal narrowing related to disc bulge.  L5-S1: Shallow posterior disc bulge with disc desiccation. No appreciable focal disc protrusion. No canal or neural foraminal stenosis. No evidence for neural impingement.  IMPRESSION: 1. Shallow right foraminal disc protrusion at L4-5, closely approximating the exiting right L4 nerve root without frank neural impingement. 2. Mild bilateral L4 foraminal stenosis related to disc bulge. 3. Mild disc bulging at L5-S1 without stenosis or neural impingement.   Electronically Signed   By: Rise MuBenjamin  McClintock M.D.   On: 12/25/2016 14:13   Assessment and Plan:   Chronic left-sided low back pain with left-sided sciatica - Plan: Ambulatory referral to Physical Therapy  SI (sacroiliac) joint dysfunction  Obesity (BMI 35.0-39.9 without comorbidity)  Chronic low  back pain with acute flareup and without radicular symptoms currently.  Ongoing SI joint pain as well as some mild trochanteric bursitis.  Pulse with steroids, begin gabapentin titration which she tolerated well in the past with good relief of symptoms.  Flexeril as needed.  No prior physical therapy, I think a good formal PT program with likely help the patient quite a bit and act as a segway for her to get into a long-term back maintenance program.  She is more motivated now and just got a gym membership.  Also think the general fitness and weight loss would help quite a bit.  If symptoms persist greater than 3 to 4 weeks, I certainly think it is reasonable to have Dr. Alvester MorinNewton see her again for consideration of repeat injection.  I appreciate the opportunity to evaluate this very friendly patient. If you have any question regarding her care or prognosis, do not hesitate to ask.   Meds ordered this encounter  Medications  . predniSONE (DELTASONE) 20 MG tablet    Sig: 2 tabs po daily for 5 days, then 1 tab po daily for 5 days    Dispense:  15 tablet    Refill:  0  . gabapentin (NEURONTIN) 300 MG capsule    Sig: Take 1 capsule (300 mg total) by mouth 3 (three) times daily.    Dispense:  270 capsule    Refill:  1  . cyclobenzaprine (FLEXERIL) 10 MG tablet    Sig: Take 1 tablet (10 mg total) by mouth at bedtime as needed for muscle spasms.    Dispense:  30 tablet    Refill:  3   Orders Placed This Encounter  Procedures  . Ambulatory referral to Physical Therapy    Signed,  Karleen HampshireSpencer T. Yara Tomkinson, MD   Outpatient Encounter Medications as of 05/29/2018  Medication Sig  . amLODipine (NORVASC) 10 MG tablet Take 1 tablet (10 mg total) by mouth daily.  . danazol (DANOCRINE) 200 MG capsule Take 1 capsule (200 mg total) by mouth 2 (two) times daily.  Marland Kitchen. doxycycline (VIBRA-TABS) 100 MG tablet Take 1 tablet (100 mg total) by mouth 2 (two) times  daily for 7 days.  Marland Kitchen losartan-hydrochlorothiazide  (HYZAAR) 100-25 MG tablet Take 1 tablet by mouth daily.  . metoprolol succinate (TOPROL-XL) 50 MG 24 hr tablet Take 50 mg by mouth daily.  . cyclobenzaprine (FLEXERIL) 10 MG tablet Take 1 tablet (10 mg total) by mouth at bedtime as needed for muscle spasms.  Marland Kitchen gabapentin (NEURONTIN) 300 MG capsule Take 1 capsule (300 mg total) by mouth 3 (three) times daily.  . predniSONE (DELTASONE) 20 MG tablet 2 tabs po daily for 5 days, then 1 tab po daily for 5 days  . [DISCONTINUED] HYDROcodone-homatropine (HYCODAN) 5-1.5 MG/5ML syrup Take 5 mLs by mouth every 8 (eight) hours as needed for cough.   No facility-administered encounter medications on file as of 05/29/2018.

## 2018-05-31 ENCOUNTER — Encounter: Payer: Self-pay | Admitting: Family Medicine

## 2018-05-31 ENCOUNTER — Encounter: Payer: BLUE CROSS/BLUE SHIELD | Admitting: Family Medicine

## 2018-05-31 DIAGNOSIS — Z0289 Encounter for other administrative examinations: Secondary | ICD-10-CM

## 2018-06-03 ENCOUNTER — Telehealth: Payer: Self-pay | Admitting: Family Medicine

## 2018-06-03 ENCOUNTER — Encounter: Payer: Self-pay | Admitting: Family Medicine

## 2018-06-03 MED ORDER — HYDROCODONE-ACETAMINOPHEN 5-325 MG PO TABS: 1.0000 | ORAL_TABLET | Freq: Four times a day (QID) | ORAL | 0 refills | Status: AC | PRN

## 2018-06-03 NOTE — Telephone Encounter (Signed)
I need someone to check about her PT referral that I did last week  thanks

## 2018-06-03 NOTE — Telephone Encounter (Signed)
Appt made and patient is aware. °

## 2018-06-03 NOTE — Telephone Encounter (Signed)
Sent patient a Mychart message because she didn't answer her phone to get scheduled.

## 2018-06-10 ENCOUNTER — Encounter: Payer: Self-pay | Admitting: Family Medicine

## 2018-06-10 MED ORDER — TRAMADOL HCL 50 MG PO TABS: 50.0000 mg | ORAL_TABLET | Freq: Four times a day (QID) | ORAL | 2 refills | Status: DC | PRN

## 2018-06-11 ENCOUNTER — Ambulatory Visit: Payer: BLUE CROSS/BLUE SHIELD | Admitting: Sports Medicine

## 2018-07-02 ENCOUNTER — Encounter: Payer: Self-pay | Admitting: Family Medicine

## 2018-07-02 NOTE — Telephone Encounter (Signed)
Last refilled 06/10/2018 for #40 with 2 refills.

## 2018-07-03 MED ORDER — TRAMADOL HCL 50 MG PO TABS: 50.0000 mg | ORAL_TABLET | Freq: Four times a day (QID) | ORAL | 1 refills | Status: AC | PRN

## 2018-07-12 ENCOUNTER — Other Ambulatory Visit: Payer: Self-pay | Admitting: Family Medicine

## 2018-07-27 ENCOUNTER — Other Ambulatory Visit: Payer: Self-pay | Admitting: Family Medicine

## 2018-07-29 ENCOUNTER — Encounter: Payer: Self-pay | Admitting: Family Medicine

## 2018-07-29 NOTE — Telephone Encounter (Signed)
Last office visit 05/29/2018 with Dr. Patsy Lager for back pain.  Last refilled 07/03/2018 for #80 with 1 refill.  No future appointments

## 2018-08-08 ENCOUNTER — Other Ambulatory Visit: Payer: Self-pay | Admitting: Obstetrics and Gynecology

## 2018-08-08 NOTE — Telephone Encounter (Signed)
Please advise that I am unable to fill her refill request at this time as her pharmacy has noted that she has had 3 refills within the past month by a different prescriber (a Dr. Karleen Hampshire to be exact).

## 2018-08-08 NOTE — Telephone Encounter (Signed)
Please advise. Thanks Nicholette Dolson 

## 2018-08-09 NOTE — Telephone Encounter (Signed)
I have called patient and gave her Dr. Oretha Milch message. Patient verbalized understanding about not being able to fill at this time.

## 2018-08-12 NOTE — Telephone Encounter (Signed)
Pt has a televisit appointment with [facility] on 08/14/18.

## 2018-08-13 NOTE — Telephone Encounter (Signed)
I sent a message to [place] regarding this patient having recently had 2-3 prescriptions in the past month by another provider for the same medication (tramadol) per her pharmacy.  It is recommended that she have only 1 provider prescribing pain medications. If she would like for Dr. Karleen Hampshire to continue then I'm fine with that.  If she would like for pain management to manage her meds I can refer her as well.

## 2018-08-14 ENCOUNTER — Ambulatory Visit (INDEPENDENT_AMBULATORY_CARE_PROVIDER_SITE_OTHER): Payer: BLUE CROSS/BLUE SHIELD | Admitting: Obstetrics and Gynecology

## 2018-08-14 ENCOUNTER — Encounter: Payer: Self-pay | Admitting: Obstetrics and Gynecology

## 2018-08-14 ENCOUNTER — Telehealth: Payer: Self-pay | Admitting: Obstetrics and Gynecology

## 2018-08-14 ENCOUNTER — Other Ambulatory Visit: Payer: Self-pay

## 2018-08-14 VITALS — Ht 65.0 in | Wt 228.0 lb

## 2018-08-14 DIAGNOSIS — N809 Endometriosis, unspecified: Secondary | ICD-10-CM

## 2018-08-14 DIAGNOSIS — N343 Urethral syndrome, unspecified: Secondary | ICD-10-CM

## 2018-08-14 DIAGNOSIS — N941 Unspecified dyspareunia: Secondary | ICD-10-CM

## 2018-08-14 DIAGNOSIS — M6289 Other specified disorders of muscle: Secondary | ICD-10-CM

## 2018-08-14 DIAGNOSIS — M545 Low back pain, unspecified: Secondary | ICD-10-CM

## 2018-08-14 DIAGNOSIS — R102 Pelvic and perineal pain: Secondary | ICD-10-CM

## 2018-08-14 MED ORDER — DIAZEPAM 10 MG PO TABS: ORAL_TABLET | ORAL | 1 refills | Status: DC

## 2018-08-14 MED ORDER — TRAMADOL HCL 50 MG PO TABS: 100.0000 mg | ORAL_TABLET | Freq: Four times a day (QID) | ORAL | 0 refills | Status: DC | PRN

## 2018-08-14 NOTE — Telephone Encounter (Signed)
The patient is calling to check on the status of the medications that are to be sent in today by Dr. Valentino Saxon, since the patient is already out. Please advise.

## 2018-08-14 NOTE — Telephone Encounter (Signed)
Pt was called and informed that her medication had been sent in to the pharmacy [facility].

## 2018-08-14 NOTE — Progress Notes (Signed)
Telephone visit. Pt was routed from the front desk. Pt is having a telephone visit today due to wanting to discuss other options concerning her pain and to do medication management.

## 2018-08-14 NOTE — Progress Notes (Signed)
Virtual Visit via Telephone Note  I connected with [name] on 08/14/18 at  2:40 PM EDT by telephone and verified that I am speaking with the correct person using two identifiers.   I discussed the limitations, risks, security and privacy concerns of performing an evaluation and management service by telephone and the availability of in person appointments. I also discussed with the patient that there may be a patient responsible charge related to this service. The patient expressed understanding and agreed to proceed.   History of Present Illness: [name] is a 38 y.o. [id number] female with a PMH of endometriosis and chronic pelvic pain with previous surgical history of hysterectomy with BSO who is currently on danazol therapy.  Patient is presenting today for follow-up of pelvic pain symptoms to being started on the danazol in January.  Of note patient has been on several rounds of medication to manage her endometriosis and pain including danazol, Lupron, oral progesterone, and Depo-Provera in the past which all tend to offer some temporary relief for several months before requiring altering to a different medication.  She also currently desires a refill on her tramadol.  Patient additionally is continuing to complain of pain with intercourse, and will vaginal bleeding.  She is noting that her pain in the pelvis feels like sharp twisting pain near her ovaries (right > left) although she knows that they have both been surgically removed.  Prior to be restarted on methimazole, patient was tried on Orilissa for possible ovarian remnant syndrome however patient noted side effects and could not continue the medication.  Additionally, patient is beginning to note back pain again.  She states that she is not sure if the pain begins in the back and radiates to the front or vice versa.  She reports that within the past 1 to 2 months she was seen by her PCP who ruled out a UTI.  And was  recommended to follow-up with an orthopedic specialist as she had seen one in the past and required an epidural injection in her back approximately 2 years ago.  Patient notes that she was seen by the orthopedist who recommended a trial of medications for her back pain including prednisone, gabapentin, Flexeril, and tramadol.   Observations/Objective: Vitals:   08/14/18 1444  Weight: 228 lb (103.4 kg)  Height: 5\' 5"  (1.651 m)    Assessment and Plan:  1.  Endometriosis -patiently currently taking danazol.  Notes that her symptoms are okay most days but still continues to have moments of sharp pain in the lower pelvis.  Is wondering if she has developed more scar tissue and if another surgery would be indicated.  I discussed with patient that the goal is to try to minimize surgical encounters due to the risk of creating more scar tissue and worsening the pain.  To continue danazol at this time.  She will need to continue for a total of approximately 6 months. 2.  Pelvic pain -currently managed with tramadol, however patient was noted to have a prescription given by a different physician over the past month.  Patient has explained that she was seen by her orthopedist who gave her a separate prescription in order that it did not affect the prescriptions that she was given by me.  I discussed with patient that she should notify me if at any point she received a prescription for pain medication from another physician to decrease concern for overuse and abuse.  Will give new prescription. 3.  Dyspareunia and possible pelvic floor tightening-discussed other options for management of dyspareunia as the danazol is not seem to be helping with possible suppression of endometriosis and pelvic pain.  Discussed option of beginning vaginal Valium as patient notes that she often tenses up at sexual encounters due to association of pain with intercourse and notes that it is beginning to affect her relationship.  Will  prescribe. Advised that she can try the Valium for 6 weeks. I also recommended that she follow-up with a pelvic floor physical therapist.  Patient notes that she contacted the hospital to arrange for physical therapy however it would be cost prohibitive.  Notes that the physical therapist at the hospital recommended a new therapist in [place] which may be more cost effective for her.  Can refer to new physical therapist.  Also discussed the use of vaginal dilators to help with potential vaginal spasms.   She may also have some issues with dyspareunia due to having surgical menopause.  I also discussed the option of use of local hormone replacement therapy.  Patient has tried local estrogen in the past however this caused her endometriosis pain to flare.  Would consider use of Intrarosa if no significant improvement with the vaginal Valium. 4.  Back pain -patient notes that she has a follow-up next month with the orthopedist.  May require another epidural injection in her back as it helped her in the past.  Follow Up Instructions: I will follow-up with the patient in another 4 weeks to reassess her pelvic pain and dyspareunia symptoms via tele-visit.   I discussed the assessment and treatment plan with the patient. The patient was provided an opportunity to ask questions and all were answered. The patient agreed with the plan and demonstrated an understanding of the instructions.   The patient was advised to call back or seek an in-person evaluation if the symptoms worsen or if the condition fails to improve as anticipated.  I provided 15 minutes of non-face-to-face time during this encounter.  Medical decision level making was moderate for this encounter .   Hildred LaserAnika Kylle Lall, MD [facility]

## 2018-08-26 ENCOUNTER — Other Ambulatory Visit: Payer: Self-pay | Admitting: Family Medicine

## 2018-08-26 NOTE — Telephone Encounter (Signed)
Last office visit 05/29/2018 for back pain with sciatica.  Last refilled 05/29/2018 for #270 with 1 refill.  Pharmacy is asking for next refill to place on file.  No future appointments.

## 2018-09-02 ENCOUNTER — Other Ambulatory Visit: Payer: Self-pay

## 2018-09-02 ENCOUNTER — Other Ambulatory Visit: Payer: Self-pay | Admitting: Obstetrics and Gynecology

## 2018-09-02 NOTE — Telephone Encounter (Signed)
Please advise on refill.

## 2018-09-02 NOTE — Telephone Encounter (Signed)
I tried to call the patient and did not get an answer. I called the pharmacy and they said she picked the prescription up the day that it was sent in on 08/14/18.

## 2018-09-03 NOTE — Telephone Encounter (Signed)
Pt called no answer LM to call the office to speak about the medication refill sent by the pharmacy.

## 2018-09-11 ENCOUNTER — Ambulatory Visit (INDEPENDENT_AMBULATORY_CARE_PROVIDER_SITE_OTHER): Payer: BLUE CROSS/BLUE SHIELD | Admitting: Obstetrics and Gynecology

## 2018-09-11 ENCOUNTER — Other Ambulatory Visit: Payer: Self-pay

## 2018-09-11 ENCOUNTER — Encounter: Payer: Self-pay | Admitting: Obstetrics and Gynecology

## 2018-09-11 VITALS — Ht 65.0 in | Wt 220.0 lb

## 2018-09-11 DIAGNOSIS — Z8742 Personal history of other diseases of the female genital tract: Secondary | ICD-10-CM

## 2018-09-11 DIAGNOSIS — M6289 Other specified disorders of muscle: Secondary | ICD-10-CM

## 2018-09-11 DIAGNOSIS — G8929 Other chronic pain: Secondary | ICD-10-CM

## 2018-09-11 DIAGNOSIS — N941 Unspecified dyspareunia: Secondary | ICD-10-CM

## 2018-09-11 DIAGNOSIS — R102 Pelvic and perineal pain: Secondary | ICD-10-CM

## 2018-09-11 NOTE — Progress Notes (Signed)
Virtual Visit via Telephone Note  I connected with [name] on 09/11/18 at  3:40 PM EDT by telephone and verified that I am speaking with the correct person using two identifiers.   I discussed the limitations, risks, security and privacy concerns of performing an evaluation and management service by telephone and the availability of in person appointments. I also discussed with the patient that there may be a patient responsible charge related to this service. The patient expressed understanding and agreed to proceed.   History of Present Illness: [name] is a 38 y.o. G76P1102 female with a PMH of endometriosis and chronic pelvic pain with previous surgical history of hysterectomy with BSO who is currently on danazol therapy.  She was started on vaginal Valium for her dyspareunia and suspected pelvic floor dysfunction (ncreased pelvic tone).  She has been on the medication for almost 1 month.  She notes that she does feel like it may be helping, feels a decrease in tone, however has not had intercourse recently to assess for improvement in dyspareunia.    Observations/Objective:  Height 5\' 5"  (1.651 m), weight 220 lb (99.8 kg), last menstrual period 10/15/2013.  Assessment and Plan: 1.  Endometriosis - patiently currently taking danazol.  Notes that her symptoms are okay most days but still continues to have moments of sharp pain in the lower pelvis. Will continue Danazol at this time, due to discontinue in July (total of 6 months of therapy). Will f/u in 2 months to discuss future treatment options (patient has been on several rounds of medication to manage her endometriosis and pain including danazol, Lupron, oral progesterone, and Depo-Provera in the past which all tend to offer some temporary relief for several months before requiring altering to a different medication).   2. Chronic pelvic pain - She has recently had a refill on her Tramadol. Is using heating pads as needed.   3. Dyspareunia - possibly secondary to some pelvic floor dysfunction. Is beginning to note some change with the vaginal valium. Also discussed that many practices and services are beginning to reopen during the COVID pandemic, and that she should be able to reach out to physical therapy in order to get established as this may likely help her pain.   Follow Up Instructions:  I discussed the assessment and treatment plan with the patient. The patient was provided an opportunity to ask questions and all were answered. The patient agreed with the plan and demonstrated an understanding of the instructions.   The patient was advised to call back or seek an in-person evaluation if the symptoms worsen or if the condition fails to improve as anticipated.  I provided 11 minutes of non-face-to-face time during this encounter.   Hildred Laser, MD

## 2018-09-11 NOTE — Progress Notes (Signed)
Telephone visit. Pt was transfered from the front. Pt is having a telephone due to a follow up from pain management using prescription pain medications.  Reviewed medication and made updates. Pt stated that the medication she is currently taking for pain is working.

## 2018-10-11 ENCOUNTER — Ambulatory Visit: Payer: Self-pay

## 2018-10-11 ENCOUNTER — Other Ambulatory Visit: Payer: Self-pay

## 2018-10-11 VITALS — BP 160/116 | HR 79 | Temp 98.1°F | Resp 16 | Ht 65.0 in | Wt 223.0 lb

## 2018-10-11 DIAGNOSIS — S93401A Sprain of unspecified ligament of right ankle, initial encounter: Secondary | ICD-10-CM

## 2018-10-11 NOTE — Patient Instructions (Signed)
Presents with c/o right ankle pain.  No known injury.  States on Wednesday she was sitting with her legs under her & when she got up & started walking, her ankle started hurting.  Today while she was working, she was going to elevate foot on foot stool & it was hurting at rest.  Assessed foot - visible swelling at ankle joint.  No redness or warmth to the touch.  + pedal pulse.  No discoloration to foot.  FormFit Ankle Brace applied.  Hasn't taken any anti-inflammatories.  Advised to take ibuprofen 400 mg tid with food for the next 3-4 days.  Ice 15-20 minutes prn.  Elevate prn.

## 2018-10-16 ENCOUNTER — Encounter: Payer: Self-pay | Admitting: Family Medicine

## 2018-10-16 MED ORDER — METOPROLOL SUCCINATE ER 50 MG PO TB24: 50.0000 mg | ORAL_TABLET | Freq: Every day | ORAL | 0 refills | Status: DC

## 2018-10-21 ENCOUNTER — Ambulatory Visit: Payer: BLUE CROSS/BLUE SHIELD | Admitting: Family Medicine

## 2018-10-21 ENCOUNTER — Telehealth: Payer: Self-pay

## 2018-10-21 ENCOUNTER — Other Ambulatory Visit: Payer: Self-pay | Admitting: Obstetrics and Gynecology

## 2018-10-21 NOTE — Telephone Encounter (Signed)
  1. Do you have a fever? No, but hasn't checked it. 2. Are you having chills?  This morning around 4:30-5:00am was alternating between hot & cold & sweating. 3. Do you have a sore throat? "scratchy throat, almost like allergies" 4. Are you experiencing resp S/Sx -cough, SOB? "little cough - not constant".  No SOB 5. Do you have muscle aches? No 6. Are you experiencing N/V/D? Diarrhea 6x times yesterday & 3x today 7. Experiencing loss of sense of taste and/or smell? No 8. Do you have a headache? No 9. Have you had contact with a person confirmed Positive for Covid-19? No 10. Have you traveled outside of [place]? No   Spoke with Anderson Malta.  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

## 2018-10-21 NOTE — Telephone Encounter (Signed)
Spoke with [employer].  Advised her to call the [facility] & gave her the number [phone] to call.  Informed her they will more than likely send her for testing.

## 2018-10-21 NOTE — Telephone Encounter (Signed)
Please have patient call the Covid nurse hotline and I do anticipate they will recommend her being tested. Await their input and rec's.

## 2018-10-22 ENCOUNTER — Ambulatory Visit (INDEPENDENT_AMBULATORY_CARE_PROVIDER_SITE_OTHER): Payer: 59 | Admitting: Family Medicine

## 2018-10-22 ENCOUNTER — Encounter: Payer: Self-pay | Admitting: Family Medicine

## 2018-10-22 ENCOUNTER — Telehealth: Payer: Self-pay | Admitting: *Deleted

## 2018-10-22 ENCOUNTER — Other Ambulatory Visit: Payer: Self-pay

## 2018-10-22 VITALS — HR 92 | Temp 98.0°F

## 2018-10-22 DIAGNOSIS — Z20822 Contact with and (suspected) exposure to covid-19: Secondary | ICD-10-CM

## 2018-10-22 DIAGNOSIS — R6889 Other general symptoms and signs: Secondary | ICD-10-CM

## 2018-10-22 DIAGNOSIS — R197 Diarrhea, unspecified: Secondary | ICD-10-CM | POA: Diagnosis not present

## 2018-10-22 DIAGNOSIS — Z20828 Contact with and (suspected) exposure to other viral communicable diseases: Secondary | ICD-10-CM

## 2018-10-22 NOTE — Telephone Encounter (Signed)
Scheduled patient for COVID 19 test tomorrow morning at [facility] at 8:45 am.  Testing protocol reviewed with patient.

## 2018-10-22 NOTE — Telephone Encounter (Signed)
Please advise. Thanks Chantilly Linskey 

## 2018-10-22 NOTE — Progress Notes (Signed)
I connected with Solomiya Pascale Domangue on 10/22/18 at  8:00 AM EDT by video and verified that I am speaking with the correct person using two identifiers.   I discussed the limitations, risks, security and privacy concerns of performing an evaluation and management service by video and the availability of in person appointments. I also discussed with the patient that there may be a patient responsible charge related to this service. The patient expressed understanding and agreed to proceed.  Patient location: Home Provider Location: [place] Participants: Lesleigh Noe and [employer] [place]   Subjective:     [name] is a 38 y.o. female presenting for Cough     Cough This is a new problem. The current episode started in the past 7 days. The problem has been unchanged. Episode frequency: cough worse night. The cough is non-productive. Associated symptoms include chills, headaches (gets HA with BP) and sweats. Pertinent negatives include no fever, heartburn, myalgias, nasal congestion, postnasal drip, rhinorrhea, sore throat or shortness of breath. The symptoms are aggravated by lying down. Treatments tried: claritin. The treatment provided no relief. There is no history of asthma or environmental allergies.   Work advised that she be tested for Covid-19. 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   Has had some people at work sick - but no clear contact  Diarrhea - 7 times per day, very loose stools Husband with some GI symptoms as well  Review of Systems  Constitutional: Positive for chills. Negative for fever.  HENT: Negative for postnasal drip, rhinorrhea and sore throat.   Respiratory: Positive for cough. Negative for shortness of breath.   Gastrointestinal: Positive for abdominal pain (crampy before BM) and diarrhea. Negative for blood in stool, heartburn, nausea and vomiting.  Musculoskeletal: Negative for myalgias.   Allergic/Immunologic: Negative for environmental allergies.  Neurological: Positive for headaches (gets HA with BP).     Social History   Tobacco Use  Smoking Status Never Smoker  Smokeless Tobacco Never Used        Objective:   BP Readings from Last 3 Encounters:  10/11/18 (!) 160/116  05/29/18 140/90  05/23/18 140/80   Wt Readings from Last 3 Encounters:  10/11/18 223 lb (101.2 kg)  09/11/18 220 lb (99.8 kg)  08/14/18 228 lb (103.4 kg)    Pulse 92   Temp 98 F (36.7 C) (Oral)   LMP 10/15/2013    Physical Exam Constitutional:      Appearance: Normal appearance. She is not ill-appearing.  HENT:     Head: Normocephalic and atraumatic.     Right Ear: External ear normal.     Left Ear: External ear normal.  Eyes:     Conjunctiva/sclera: Conjunctivae normal.  Pulmonary:     Effort: Pulmonary effort is normal. No respiratory distress.  Neurological:     Mental Status: She is alert. Mental status is at baseline.  Psychiatric:        Mood and Affect: Mood normal.        Behavior: Behavior normal.        Thought Content: Thought content normal.        Judgment: Judgment normal.            Assessment & Plan:   Problem List Items Addressed This Visit    None    Visit Diagnoses    Suspected Covid-19 Virus Infection    -  Primary   Acute diarrhea  Discussed OTC treatment for viral illness and diarrhea - fluids Given symptoms possible covid-19 and would recommend being tested ER precautions given Advised staying out of work and isolating until results have come back [facility]    Return if symptoms worsen or fail to improve.  Lynnda ChildJessica R Gredmarie Delange, MD

## 2018-10-22 NOTE — Telephone Encounter (Signed)
-----   Message from Lesleigh Noe, MD sent at 10/22/2018  8:22 AM EDT ----- Cough + Diarrhea Works for [employer] department Please test for [employer]

## 2018-10-22 NOTE — Patient Instructions (Signed)
Based on your symptoms, it looks like you have a virus.   Antibiotics are not need for a viral infection but the following will help:   1. Drink plenty of fluids 2. Get lots of rest  Cough 1) Cough drops can be helpful 2) Nyquil (or nighttime cough medication) 3) Honey is proven to be one of the best cough medications  4) Cough medicine with Dextromethorphan can also be helpful   Diarrhea - drink plenty of fluids - if you notice you have not urinated in 8-12 hours consider calling back or going to the ER as this may be as sign of dehydration

## 2018-10-23 ENCOUNTER — Other Ambulatory Visit: Payer: 59

## 2018-10-23 DIAGNOSIS — R6889 Other general symptoms and signs: Secondary | ICD-10-CM | POA: Diagnosis not present

## 2018-10-28 LAB — NOVEL CORONAVIRUS, NAA: SARS-CoV-2, NAA: NOT DETECTED

## 2018-11-11 NOTE — Progress Notes (Deleted)
Pt is present toay for a follow up for pelvic and endometriosis pain. Pt stated

## 2018-11-12 ENCOUNTER — Encounter: Payer: BLUE CROSS/BLUE SHIELD | Admitting: Obstetrics and Gynecology

## 2018-11-12 ENCOUNTER — Other Ambulatory Visit: Payer: Self-pay | Admitting: Obstetrics and Gynecology

## 2018-11-13 NOTE — Telephone Encounter (Signed)
Please advise. Thanks Marquise Wicke 

## 2018-11-21 ENCOUNTER — Telehealth: Payer: Self-pay

## 2018-11-21 NOTE — Telephone Encounter (Signed)
Pt called no answer LM via VM that her insurance asked for PA for Tramadol and she missed her last appointment and AC needed for her to make an appointment to be seen.

## 2018-11-22 NOTE — Telephone Encounter (Signed)
I just sent  a refill request for her on 7/28.  It is not time to refill.  I had already declined the new refill request. Can we make sure that she picked it up from the pharmacy. Also, has she been rescheduled for the  appointment that she missed?

## 2018-11-25 ENCOUNTER — Encounter: Payer: Self-pay | Admitting: Family Medicine

## 2018-11-25 ENCOUNTER — Other Ambulatory Visit: Payer: Self-pay | Admitting: Obstetrics and Gynecology

## 2018-11-26 ENCOUNTER — Other Ambulatory Visit: Payer: Self-pay | Admitting: Family Medicine

## 2018-11-26 ENCOUNTER — Telehealth: Payer: Self-pay | Admitting: Obstetrics and Gynecology

## 2018-11-26 NOTE — Telephone Encounter (Signed)
Pt called and stated that she sent a mychart message about refilling her prescription and did not get a response. Pt is checking the status of that prescription refill. Please advise.

## 2018-11-27 ENCOUNTER — Other Ambulatory Visit: Payer: Self-pay

## 2018-11-27 ENCOUNTER — Encounter: Payer: Self-pay | Admitting: Internal Medicine

## 2018-11-27 ENCOUNTER — Ambulatory Visit: Payer: 59 | Admitting: Internal Medicine

## 2018-11-27 VITALS — BP 167/107 | HR 74 | Temp 98.2°F | Resp 14 | Ht 65.0 in | Wt 223.0 lb

## 2018-11-27 DIAGNOSIS — E7849 Other hyperlipidemia: Secondary | ICD-10-CM

## 2018-11-27 DIAGNOSIS — Z8349 Family history of other endocrine, nutritional and metabolic diseases: Secondary | ICD-10-CM

## 2018-11-27 DIAGNOSIS — Z889 Allergy status to unspecified drugs, medicaments and biological substances status: Secondary | ICD-10-CM | POA: Insufficient documentation

## 2018-11-27 DIAGNOSIS — I10 Essential (primary) hypertension: Secondary | ICD-10-CM

## 2018-11-27 DIAGNOSIS — Z6837 Body mass index (BMI) 37.0-37.9, adult: Secondary | ICD-10-CM | POA: Insufficient documentation

## 2018-11-27 DIAGNOSIS — E8881 Metabolic syndrome: Secondary | ICD-10-CM

## 2018-11-27 MED ORDER — LOSARTAN POTASSIUM-HCTZ 100-25 MG PO TABS: 1.0000 | ORAL_TABLET | Freq: Every day | ORAL | 3 refills | Status: DC

## 2018-11-27 MED ORDER — AMLODIPINE BESYLATE 10 MG PO TABS: 10.0000 mg | ORAL_TABLET | Freq: Every day | ORAL | 3 refills | Status: DC

## 2018-11-27 MED ORDER — LOSARTAN POTASSIUM-HCTZ 100-25 MG PO TABS: 1.0000 | ORAL_TABLET | Freq: Every day | ORAL | 0 refills | Status: DC

## 2018-11-27 NOTE — Telephone Encounter (Signed)
Refilled x 1 month  Advised patient of follow up needed for further refills.   Nothing further needed.

## 2018-11-27 NOTE — Progress Notes (Signed)
[name]  - 38 y.o white female who presents for BP med refill.  She [facility] and Dr. Valentino Saxonherry from gyn for years and [facility], and now is full time and noted plans to start following up here.  She was at the beach last week, home this past Sunday and noted she was out of her losartan/HCTZ med the entire time at the beach and since home (not taken in >10 days now). She was on the other two BP meds then. She noted no real HA'[name] at the beach, but this morning had a bad HA and took ibuprofen to help. Was more left sided. She denied any CP, palp'[name] SOB, LE swelling, vision changes in her recent past. No fevers and no concerning sx'[name] for Covid.   She has a h/o severe endometriosis/chronic pelvic pain with many surgeries in the past to manage, also HTN and on 4 meds presently to manage and denied any work-up in her past for secondary HTN, hyperlipidemia and obesity with metabolic syndrome concern noted, and has not ever been on medicines to manage her lipids.   She was in the ER in November with pains after a UTI and her potassium then was low (3.2) and she noted this was not addressed with any f/u and has not had labs to check since, and her glc was 136 and kidney function normal then. Her potassium was also noted to be low 2 years ago.  Diet - notes has tried to watch and eat healthier to lose weight in the past, but not successful and often then gives up trying  Meds reviewed Current Outpatient Medications on File Prior to Visit  Medication Sig Dispense Refill  . amLODipine (NORVASC) 10 MG tablet Take 1 tablet (10 mg total) by mouth daily. 90 tablet 3  . danazol (DANOCRINE) 200 MG capsule Take 1 capsule (200 mg total) by mouth 2 (two) times daily. 60 capsule 6  . diazepam (VALIUM) 10 MG tablet Place 1 tablet vaginally each night x 2 weeks, then decrease to three nights per week. 30 tablet 1  . metoprolol succinate (TOPROL-XL) 50 MG 24 hr tablet Take 1 tablet (50 mg total) by  mouth daily. 30 tablet 0  . traMADol (ULTRAM) 50 MG tablet TAKE TWO TABLETS BY MOUTH EVERY 6 HOURS AS NEEDED 90 tablet 0  . losartan-hydrochlorothiazide (HYZAAR) 100-25 MG tablet Take 1 tablet by mouth daily. (Patient not taking: Reported on 11/27/2018) 90 tablet 1   No current facility-administered medications on file prior to visit.      Allergies  Allergen Reactions  . Sulfamethoxazole-Trimethoprim Swelling and Rash  . Amoxil [Amoxicillin] Rash     Has tolerated augmentin!!  ? If had viral resh with amox.  . Azithromycin Rash  . Cefprozil Rash  . Ceftin [Cefuroxime Axetil] Rash  . Other Itching    CHG-wipes she had to use prier to surgery.  Ok to use CHG wash without problems CHG-wipes she had to use prier to surgery.  Ok to use CHG wash without problems  . Penicillins Rash    Has patient had a PCN reaction causing immediate rash, facial/tongue/throat swelling, SOB or lightheadedness with hypotension: Yes Has patient had a PCN reaction causing severe rash involving mucus membranes or skin necrosis: No Has patient had a PCN reaction that required hospitalization No Has patient had a PCN reaction occurring within the last 10 years: No If all of the above answers are "NO", then may proceed  with Cephalosporin use.   . Sulfa Antibiotics Rash   Noted the multiple allergies and she noted may not all be accurate as she has tolerated Augmentin in the past as one example  Social History   Tobacco Use  Smoking Status Never Smoker  Smokeless Tobacco Never Used     FH - + HTN in D and M, Hemochromatosis in M, CA'[name] on M'[name] side with breast and ovarian noted  O - NAD, masked, obese  BP (!) 167/107 (BP Location: Left Arm, Patient Position: Sitting, Cuff Size: Large)   Pulse 74   Temp 98.2 F (36.8 C) (Oral)   Resp 14   Ht 5\' 5"  (1.651 m)   Wt 223 lb (101.2 kg)   LMP 10/15/2013   SpO2 99%   BMI 37.11 kg/m    Recheck 178/108 by me with machine  HEENT - sclera anicteric,  Car -  RRR without m/g/r Pulm- CTA  Abd - soft, NT, ND no obvious HSM, no masses Back - no CVA tenderness Ext - no LE edema, no active joints Neuro - affect was not flat, appropriate with conversation  Grossly non-focal   Labs reviewed in past as above noted  Ass/Plan:  1. HTN - concern not well controlled, also off medicine for 10+ days a factor, concern for possible secondary HTN as on 4 meds and relatively young. Primary aldosteronism in differentuial with low potassium noted on prior labs, renal artery stenosis/sleep apnea other causes to consider amongst others less common. Does have strong FH of HTN noted  Noted possible secondary HTN concern to her today Continue current BP medications to manage, and resume the losartan/HCTZ and I refilled the norvasc for her today as well (noted had 3 pills left)  Discussed goals for good control of BP, and if confirm metabolic syndrome, BP goal is better than <140/90 and likely <130/85.  Aggressive lifestyle changes likely addressed when labs back and metabolic syndrome confirmed  2. Metabolic syndrome - (3 of the following - obesity, TG > 150, HDL <50, BP > 130/85, and FBS >100)  Likely has and await fasting labs ordered Management further discussed on f/u (and surprised when she noted no real management discussed with her in the past for this)  3. Increased BMI/obesity  Has tried to lose weight in past without much success T/C nutrition input to help, with a lifestyle center referral an option to discuss on f/u   4. Hyperlipidemia  Await recheck of labs ordered for details and further rec'[name]. Never on meds to manage in her past  5. FH hemochromatosis - M  Will check her iron and ferritin as part of labs checked  6. Chronic pelvic pain/endometriosis   Cont to follow with Dr. Marcelline Mates from gyn, has a f/u appt in one week noted  7. Non-compliance concern - helped encourage to not let medicines run out and stop taking like happened here in  recent past.  8. Multiple drug allergies - discussed with patient possible referral to allergist and get tested for drug allergies as very limited with options in the future of medicines that may be needed for management of infectious conditions. Not push today to proceed, but do feel is an option to consider in advance rather than waiting until may be needed more acutely.

## 2018-11-28 ENCOUNTER — Telehealth: Payer: Self-pay | Admitting: Internal Medicine

## 2018-11-28 NOTE — Telephone Encounter (Signed)
Called yesterday during power outage and they said they thought they got them but this morning they didn't [facility], pt aware

## 2018-11-28 NOTE — Telephone Encounter (Signed)
[facility] didn't get all of her rx's. She said that they only got one of them. She isnt sure which one she is at work and cant check.

## 2018-11-29 ENCOUNTER — Other Ambulatory Visit: Payer: Self-pay

## 2018-11-29 ENCOUNTER — Other Ambulatory Visit: Payer: 59

## 2018-11-29 NOTE — Addendum Note (Signed)
Addended by: Virgilio Frees on: 11/29/2018 08:51 AM   Modules accepted: Orders

## 2018-11-30 LAB — CMP12+LP+TP+TSH+6AC+CBC/D/PLT
ALT: 26 IU/L (ref 0–32)
AST: 22 IU/L (ref 0–40)
Albumin/Globulin Ratio: 1.6 (ref 1.2–2.2)
Albumin: 4.4 g/dL (ref 3.8–4.8)
Alkaline Phosphatase: 60 IU/L (ref 39–117)
BUN/Creatinine Ratio: 19 (ref 9–23)
BUN: 15 mg/dL (ref 6–20)
Basophils Absolute: 0 10*3/uL (ref 0.0–0.2)
Basos: 1 %
Bilirubin Total: 0.3 mg/dL (ref 0.0–1.2)
Calcium: 9.6 mg/dL (ref 8.7–10.2)
Chloride: 100 mmol/L (ref 96–106)
Chol/HDL Ratio: 4.7 ratio — ABNORMAL HIGH (ref 0.0–4.4)
Cholesterol, Total: 227 mg/dL — ABNORMAL HIGH (ref 100–199)
Creatinine, Ser: 0.77 mg/dL (ref 0.57–1.00)
EOS (ABSOLUTE): 0.1 10*3/uL (ref 0.0–0.4)
Eos: 2 %
Estimated CHD Risk: 1.2 times avg. — ABNORMAL HIGH (ref 0.0–1.0)
Free Thyroxine Index: 1.2 (ref 1.2–4.9)
GFR calc Af Amer: 115 mL/min/{1.73_m2} (ref >59)
GFR calc non Af Amer: 100 mL/min/{1.73_m2} (ref >59)
GGT: 53 IU/L (ref 0–60)
Globulin, Total: 2.8 g/dL (ref 1.5–4.5)
Glucose: 81 mg/dL (ref 65–99)
HDL: 48 mg/dL (ref >39)
Hematocrit: 40.3 % (ref 34.0–46.6)
Hemoglobin: 13.7 g/dL (ref 11.1–15.9)
Immature Grans (Abs): 0 10*3/uL (ref 0.0–0.1)
Immature Granulocytes: 0 %
Iron: 124 ug/dL (ref 27–159)
LDH: 182 IU/L (ref 119–226)
Lymphocytes Absolute: 2.7 10*3/uL (ref 0.7–3.1)
Lymphs: 51 %
MCH: 30 pg (ref 26.6–33.0)
MCHC: 34 g/dL (ref 31.5–35.7)
MCV: 88 fL (ref 79–97)
Monocytes Absolute: 0.4 10*3/uL (ref 0.1–0.9)
Monocytes: 8 %
Neutrophils Absolute: 2 10*3/uL (ref 1.4–7.0)
Neutrophils: 38 %
Phosphorus: 3.9 mg/dL (ref 3.0–4.3)
Platelets: 336 10*3/uL (ref 150–450)
Potassium: 4.5 mmol/L (ref 3.5–5.2)
RBC: 4.56 x10E6/uL (ref 3.77–5.28)
RDW: 12.7 % (ref 11.7–15.4)
Sodium: 139 mmol/L (ref 134–144)
T3 Uptake Ratio: 23 % — ABNORMAL LOW (ref 24–39)
T4, Total: 5.4 ug/dL (ref 4.5–12.0)
TSH: 6.04 u[IU]/mL — ABNORMAL HIGH (ref 0.450–4.500)
Total Protein: 7.2 g/dL (ref 6.0–8.5)
Triglycerides: 416 mg/dL — ABNORMAL HIGH (ref 0–149)
Uric Acid: 5.5 mg/dL (ref 2.5–7.1)
WBC: 5.3 10*3/uL (ref 3.4–10.8)

## 2018-11-30 LAB — URINALYSIS, ROUTINE W REFLEX MICROSCOPIC
Bilirubin, UA: NEGATIVE
Glucose, UA: NEGATIVE
Ketones, UA: NEGATIVE
Leukocytes,UA: NEGATIVE
Nitrite, UA: NEGATIVE
Protein,UA: NEGATIVE
RBC, UA: NEGATIVE
Specific Gravity, UA: 1.017 (ref 1.005–1.030)
Urobilinogen, Ur: 0.2 mg/dL (ref 0.2–1.0)
pH, UA: 5.5 (ref 5.0–7.5)

## 2018-11-30 LAB — HGB A1C W/O EAG: Hgb A1c MFr Bld: 5.4 % (ref 4.8–5.6)

## 2018-11-30 LAB — FERRITIN: Ferritin: 121 ng/mL (ref 15–150)

## 2018-12-03 ENCOUNTER — Encounter: Payer: Self-pay | Admitting: Obstetrics and Gynecology

## 2018-12-03 NOTE — Telephone Encounter (Signed)
Pt already picked up her medication last week.

## 2018-12-04 ENCOUNTER — Encounter: Payer: Self-pay | Admitting: Obstetrics and Gynecology

## 2018-12-04 ENCOUNTER — Telehealth: Payer: Self-pay | Admitting: Internal Medicine

## 2018-12-04 ENCOUNTER — Ambulatory Visit (INDEPENDENT_AMBULATORY_CARE_PROVIDER_SITE_OTHER): Payer: 59 | Admitting: Obstetrics and Gynecology

## 2018-12-04 ENCOUNTER — Other Ambulatory Visit: Payer: Self-pay

## 2018-12-04 VITALS — BP 152/111 | HR 85 | Ht 65.0 in | Wt 223.1 lb

## 2018-12-04 DIAGNOSIS — Z90721 Acquired absence of ovaries, unilateral: Secondary | ICD-10-CM

## 2018-12-04 DIAGNOSIS — G8929 Other chronic pain: Secondary | ICD-10-CM

## 2018-12-04 DIAGNOSIS — Z90710 Acquired absence of both cervix and uterus: Secondary | ICD-10-CM | POA: Diagnosis not present

## 2018-12-04 DIAGNOSIS — Z8742 Personal history of other diseases of the female genital tract: Secondary | ICD-10-CM | POA: Diagnosis not present

## 2018-12-04 DIAGNOSIS — M6289 Other specified disorders of muscle: Secondary | ICD-10-CM

## 2018-12-04 DIAGNOSIS — N941 Unspecified dyspareunia: Secondary | ICD-10-CM

## 2018-12-04 DIAGNOSIS — R102 Pelvic and perineal pain: Secondary | ICD-10-CM

## 2018-12-04 MED ORDER — ORILISSA 150 MG PO TABS: 1.0000 | ORAL_TABLET | Freq: Every day | ORAL | 2 refills | Status: DC

## 2018-12-04 MED ORDER — TRAMADOL HCL 50 MG PO TABS: ORAL_TABLET | ORAL | 3 refills | Status: DC

## 2018-12-04 NOTE — Progress Notes (Signed)
GYNECOLOGY PROGRESS NOTE  Subjective:    Patient ID: [name], female    DOB: April 19, 1980, 38 y.o.   MRN: [id number]  HPI  Patient is a 38 y.o. G28P1102 female who presents for f/u of chronic pelvic pain. Patient has a h/o endometriosis, is s/p hysterectomy with BSO.  Currently uses Ultram for pain management. Is currently on Danazol therapy for residual endometriosis suppression to assist with pain control (although no evidence of disease present on last laparoscopy). Patient notes that she has been having more persistent pain over the past 3 months. Thinks it may be time to come off Danazol. She does note that the vaginal diazepam has been helping her dyspareunia (not completely resolved, however is better).   The following portions of the patient's history were reviewed and updated as appropriate: allergies, current medications, past family history, past medical history, past social history, past surgical history and problem list.  Review of Systems Pertinent items noted in HPI and remainder of comprehensive ROS otherwise negative.   Objective:   Blood pressure (!) 152/111, pulse 85, height 5\' 5"  (1.651 m), weight 223 lb 1.6 oz (101.2 kg), last menstrual period 10/15/2013. General appearance: alert and no distress Exam deferred today.    Assessment:   Chronic pelvic pain H/o endometriosis Dyspareunia   . Plan:   - Patient has been on multiple regimens for management of her endometriosis and pain in the past including OCPs, progesterone-only methods (Depo Provera, Aygestin), Lupron, Danazol, Orlissa (had side effects with this medication). She has had both ovaries removed, in attempts to manage symptoms. Discussed that management options were limited as they often act on the HPA axis. Discussed that at this time, we have essentially exhausted treatment options.  Was unable to initiate pelvic floor physical therapy in the past due to not being covered by her insurance, but  now notes that she has a new insurance, and would like to look into this again. Can place referral. Discussed that she may also need to f/u with a Urologist to assess if pain may be more bladder related (such as bladder pain syndrome, endometriosis of the bladder).  Patient reports that she was also supposed to f/u with a Urologist in early March after she had prolonged UTI symptoms despite treatment, but due to [place] never had the opportunity.  Has previously been worked up in 2015 after she was diagnosed with endometriosis.   Discussed again with patient that if long-term pain management was going to be required that it may be best to seek care from pain management clinic.  Has been using her Ultram more frequently over the past 3-4 months.  Needs new prescription. Patient desires to also try another trial of Orilissa. Discussed that this again was a long-shot treatment as it works on the HPA axis and she no longer has her ovaries. Also she noted side effects from her last trial. Discussed that we can start at the lower dose and titrate up if tolerated. May also need referral to an endometriosis specialist.  - Will reassess symptoms after patient has initiated pelvic floor physical therapy.  Refill for Tramadol given.  - To f/u in 1 month to reassess pelvic pain after initiation of physical therapy and Orilissa. Patient also due for annual exam, to schedule appointment.     A total of 25 minutes were spent face-to-face with the patient during this encounter and over half of that time dealt with counseling and coordination of care.  [name], Terri Blades, MD [facility]

## 2018-12-04 NOTE — Telephone Encounter (Signed)
Wont be able to make it to appt tomorrow due to child care. Should she reschedule for the week he is back? Should she come next week with temps? She hasn't been checking bp but will start keeping log. She went to a ob appt today and they checked it and it was 152/111.

## 2018-12-04 NOTE — Telephone Encounter (Signed)
[name] needs to schedule the appointment with Dr. Roxan Hockey.  She should continue to monitor her BP next week & if she's having any issues, can contact us & see NP/PA.  AMD

## 2018-12-04 NOTE — Telephone Encounter (Signed)
Pt made appt. For 12/17/2018 with Dr Roxan Hockey

## 2018-12-04 NOTE — Progress Notes (Signed)
Pt is present for a follow up for pelvic pain. Pt stated that she is still having some pelvic pain.

## 2018-12-05 ENCOUNTER — Ambulatory Visit: Payer: Self-pay | Admitting: Internal Medicine

## 2018-12-05 ENCOUNTER — Encounter: Payer: Self-pay | Admitting: Obstetrics and Gynecology

## 2018-12-17 ENCOUNTER — Encounter: Payer: Self-pay | Admitting: Internal Medicine

## 2018-12-17 ENCOUNTER — Other Ambulatory Visit: Payer: Self-pay

## 2018-12-17 ENCOUNTER — Ambulatory Visit: Payer: 59 | Admitting: Internal Medicine

## 2018-12-17 VITALS — BP 135/84 | HR 67 | Temp 98.6°F | Resp 14 | Ht 65.0 in | Wt 221.0 lb

## 2018-12-17 DIAGNOSIS — L301 Dyshidrosis [pompholyx]: Secondary | ICD-10-CM | POA: Insufficient documentation

## 2018-12-17 DIAGNOSIS — I10 Essential (primary) hypertension: Secondary | ICD-10-CM

## 2018-12-17 DIAGNOSIS — E038 Other specified hypothyroidism: Secondary | ICD-10-CM | POA: Insufficient documentation

## 2018-12-17 DIAGNOSIS — E039 Hypothyroidism, unspecified: Secondary | ICD-10-CM | POA: Insufficient documentation

## 2018-12-17 DIAGNOSIS — G8929 Other chronic pain: Secondary | ICD-10-CM

## 2018-12-17 DIAGNOSIS — E7849 Other hyperlipidemia: Secondary | ICD-10-CM

## 2018-12-17 DIAGNOSIS — S90229A Contusion of unspecified lesser toe(s) with damage to nail, initial encounter: Secondary | ICD-10-CM | POA: Insufficient documentation

## 2018-12-17 DIAGNOSIS — E8881 Metabolic syndrome: Secondary | ICD-10-CM

## 2018-12-17 DIAGNOSIS — Z8349 Family history of other endocrine, nutritional and metabolic diseases: Secondary | ICD-10-CM

## 2018-12-17 NOTE — Progress Notes (Signed)
[name]  - 38 y.o white female who presents for f/u after our first visit last month. She was followed by [facility] and Dr. Valentino Saxon from gyn for years and part time with the [facility], and now is full time and noted plans to start following up here at that visit.  She saw gyn after our last visit and started back on Orlissa for chronic pelvic pain and that note was reviewed. Concerns noted by the doctor about increased Ultram use and the patient noted that was discussed today as well when I noted the importance of that as well (concerns with increasing use of opioids to manage). On that visit her BP was very high, and she noted she did not take her BP meds that morning before her visit.  At home checks in recent past, her BP'[name] have been in the 130'[name]/80'[name]. She takes her amlodipine at night now and the other two BP meds in the morning.   She denied any CP, palp'[name] SOB, ankle swelling (noted her feet sometimes swell). No fevers and no concerning sx'[name] for Covid.   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   Also noted a rash on her hands that is peeling, little bubbles at times and then open and improves, she does wash her hands a lot. Was told some type of eczema in her past.  She has a h/o severe endometriosis/chronic pelvic pain with many surgeries in the past to manage, also HTN and on 4 meds presently to manage (one combination med) and noted metabolic syndrome diagnosis last visit. Never was on meds for her lipids.  She has not been great with watching diet nor with exercise in her past, as often tries diet and when not successful, stops trying.  Meds reviewed Current Outpatient Medications on File Prior to Visit  Medication Sig Dispense Refill  . amLODipine (NORVASC) 10 MG tablet Take 1 tablet (10 mg total) by mouth daily. 90 tablet 3  . diazepam (VALIUM) 10 MG tablet Place 1  tablet vaginally each night x 2 weeks, then decrease to three nights per week. 30 tablet 1  . Elagolix Sodium (ORILISSA) 150 MG TABS Take 1 tablet by mouth daily. 30 tablet 2  . losartan-hydrochlorothiazide (HYZAAR) 100-25 MG tablet Take 1 tablet by mouth daily. 90 tablet 3  . metoprolol succinate (TOPROL-XL) 50 MG 24 hr tablet TAKE ONE TABLET EVERY DAY 90 tablet 3  . traMADol (ULTRAM) 50 MG tablet TAKE TWO TABLETS BY MOUTH EVERY 6 HOURS AS NEEDED 90 tablet 3   No current facility-administered medications on file prior to visit.    Allergies  Allergen Reactions  . Sulfamethoxazole-Trimethoprim Swelling and Rash  . Amoxil [Amoxicillin] Rash     Has tolerated augmentin!!  ? If had viral resh with amox.  . Azithromycin Rash  . Cefprozil Rash  . Ceftin [Cefuroxime Axetil] Rash  . Other Itching    CHG-wipes she had to use prier to surgery.  Ok to use CHG wash without problems CHG-wipes she had to use prier to surgery.  Ok to use CHG wash without problems  . Penicillins Rash    Has patient had a PCN reaction causing immediate rash, facial/tongue/throat swelling, SOB or lightheadedness with hypotension: Yes Has patient had a PCN reaction causing severe rash involving mucus membranes or skin necrosis: No Has patient had a PCN reaction that  required hospitalization No Has patient had a PCN reaction occurring within the last 10 years: No If all of the above answers are "NO", then may proceed with Cephalosporin use.   . Sulfa Antibiotics Rash     Noted the multiple allergies and she noted may not all be accurate as she has tolerated Augmentin in the past as one example  Social History      Tobacco Use  Smoking Status Never Smoker  Smokeless Tobacco Never Used     FH - + HTN in D and M, Hemochromatosis in M, CA'[name] on M'[name] side with breast and ovarian noted  O - NAD, masked, obese  BP 135/84 (BP Location: Left Arm, Patient Position: Sitting, Cuff Size: Large)   Pulse 67   Temp  98.6 F (37 C) (Oral)   Resp 14   Ht 5\' 5"  (1.651 m)   Wt 221 lb (100.2 kg)   LMP 10/15/2013   SpO2 99%   BMI 36.78 kg/m    BP recheck by me was 148/93 with machine  HEENT - sclera anicteric,  Car - RRR without m/g/r Pulm- CTA  Ext - no LE edema, Great toe had darkness beneath the proximal 1/3 of the nail diffusely, like a bruise under the nail, NT to palpate, no erythema around the nail Hands had minimal remnants of peeling on a few of the fingers distal, no other lesions of concern noted (she noted was better today) Neuro - affect was not flat, appropriate with conversation             Grossly non-focal   Labs reviewed from 8/14 - Urine neg, feritin normal, iron normal - 124, A1C - 5.4 with FBS - 81, K+ - 4.5, TC - 227, TG - 416, HDL - 48, LDL - not calc'ed, TSH - 6.040, T4 normal, CBC normal  Ass/Plan:  1. HTN - concern not well controlled noted last visit but also was off medicine for 10+ days a likely factor, as was missing her med when checked at gyn office. BP readings at home have been good as was the initial one at today'[name] visit. Potassium on recheck was normal. + FH noted.   Noted possible secondary HTN concern last visit and with BP'[name] better today, f/u potassium normal, will not rush to further w/u for secondary causes presently. Continue current BP medications to manage, Noted goals for good control of BP, and with metabolic syndrome, BP goal is better than <140/90 and more likely <130/85.  lifestyle changes addressed, and discussed a referral to nutrition and she was good with this, and one sent requesting diet recommendations to help with weight loss, increased TG'[name], low HDL, and BP (on 4 BP meds to control presently)  2. Metabolic syndrome - (defined as 3 of the following - obesity, TG > 150, HDL <50, BP > 130/85, and FBS >100), which she has  Educated Referral to nutrition as above Exercise importance noted and increasing her physical activity level  3.  Increased BMI/obesity  Has tried to lose weight in past without much success nutrition input to help as above  4. Hyperlipidemia  Discussed meds that sometimes are used to help manage, ones that lower TG'[name] and ones like a crestor product (a statin) that can help as well.  Will hold on adding presently as await nutrition input  5. FH hemochromatosis - M   iron and ferritin as part of labs were both normal   6. Chronic pelvic pain/endometriosis  Cont to follow with Dr. Valentino Saxonherry from gyn, and agree with minimizing opioid use for pain management is important  7. Dyshidrotic eczema likely - hands  Educated on dx Moisturizers daily rec'ed Not apply steroids with any frequency as they can dry the skin  8. Subungual hematoma - great toenail   Educated Will take a long time (months) for the blood under the nail to resorb and resolve. Can f/u if painful or increasing or not resolving   9. Borderline subclinical hypothyroidism  Educated Monitor at present and may recheck a TSH sooner than a year planned pending her clinical status.  F/u end of the year or very early in new year at the latest, sooner prn

## 2019-01-01 ENCOUNTER — Encounter: Payer: Self-pay | Admitting: Obstetrics and Gynecology

## 2019-01-01 ENCOUNTER — Encounter: Payer: 59 | Admitting: Obstetrics and Gynecology

## 2019-01-01 MED ORDER — OXYCODONE-ACETAMINOPHEN 5-325 MG PO TABS: 1.0000 | ORAL_TABLET | Freq: Four times a day (QID) | ORAL | 0 refills | Status: DC | PRN

## 2019-01-03 ENCOUNTER — Telehealth: Payer: Self-pay | Admitting: Internal Medicine

## 2019-01-03 MED ORDER — THERMACARE ARTHRITIS NECK MISC: 0 refills | Status: DC

## 2019-01-03 NOTE — Telephone Encounter (Signed)
Is requesting a heat pad for neck/shoulder pain.

## 2019-01-07 ENCOUNTER — Other Ambulatory Visit: Payer: Self-pay

## 2019-01-07 ENCOUNTER — Encounter: Payer: Self-pay | Admitting: Obstetrics and Gynecology

## 2019-01-07 ENCOUNTER — Ambulatory Visit (INDEPENDENT_AMBULATORY_CARE_PROVIDER_SITE_OTHER): Payer: 59 | Admitting: Obstetrics and Gynecology

## 2019-01-07 VITALS — BP 154/98 | HR 77 | Ht 65.0 in | Wt 216.9 lb

## 2019-01-07 DIAGNOSIS — Z8742 Personal history of other diseases of the female genital tract: Secondary | ICD-10-CM | POA: Diagnosis not present

## 2019-01-07 DIAGNOSIS — R102 Pelvic and perineal pain: Secondary | ICD-10-CM

## 2019-01-07 DIAGNOSIS — Z90710 Acquired absence of both cervix and uterus: Secondary | ICD-10-CM | POA: Diagnosis not present

## 2019-01-07 DIAGNOSIS — Z90721 Acquired absence of ovaries, unilateral: Secondary | ICD-10-CM | POA: Diagnosis not present

## 2019-01-07 DIAGNOSIS — Z23 Encounter for immunization: Secondary | ICD-10-CM | POA: Diagnosis not present

## 2019-01-07 DIAGNOSIS — N941 Unspecified dyspareunia: Secondary | ICD-10-CM | POA: Diagnosis not present

## 2019-01-07 DIAGNOSIS — G8929 Other chronic pain: Secondary | ICD-10-CM

## 2019-01-07 NOTE — Progress Notes (Signed)
Pt present for 1 month follow up from starting [place]. Pt stated that she started noticing improvement in her symptoms. Pt stated that she has limited the amount of pain medication. Pt received her flu vaccine today.

## 2019-01-07 NOTE — Progress Notes (Signed)
    GYNECOLOGY PROGRESS NOTE  Subjective:    Patient ID: [name], female    DOB: 28-Feb-1981, 38 y.o.   MRN: [id number]  HPI  Patient is a 38 y.o. G66P1102 female who presents for 1 month follow up of Orilissa therapy. Patient with a history of endometriosis, s/p hysterectomy and BSO, with residual pelvic pain and dyspareunia. Pain currently managed by Tramadol, however she notes an episode of severe pain last week for which she required stronger pain medication (Percocet, x 4 days). She notes that over this week she has begun to notice a slight improvement with her pain while using the [place]. Denies any side effects from the medication. She also reports that she is scheduled to begin pelvic floor physical therapy next week.   Patient also enquires as to whether or not she can receive a flu vaccine today.    The following portions of the patient's history were reviewed and updated as appropriate: allergies, current medications, past family history, past medical history, past social history, past surgical history and problem list.  Review of Systems Pertinent items noted in HPI and remainder of comprehensive ROS otherwise negative.   Objective:   Blood pressure (!) 154/98, pulse 77, height 5\' 5"  (1.651 m), weight 216 lb 14.4 oz (98.4 kg), last menstrual period 10/15/2013. General appearance: alert and no distress Abdomen: soft, non-tender; bowel sounds normal; no masses,  no organomegaly Pelvic: deferred  Assessment:   H/o endometriosis Pelvic pain Dyspareunia Need for flu vaccine S/p hysterectomy with BSO  Plan:   - Patient can continue on Orilissa for now.  Beginning to note mild improvement in symptoms.  Will reassess symptoms in 2 months.  - Plans to begin physical therapy next week. Should also help with pain symptoms.  - Patient desires flu vaccine, given today.  - To f/u in 2 months for annual exam.    Rubie Maid, MD [facility]

## 2019-01-13 ENCOUNTER — Other Ambulatory Visit: Payer: Self-pay | Admitting: Obstetrics and Gynecology

## 2019-01-16 ENCOUNTER — Ambulatory Visit: Payer: 59

## 2019-01-20 ENCOUNTER — Ambulatory Visit: Payer: 59 | Attending: Obstetrics and Gynecology

## 2019-02-04 ENCOUNTER — Ambulatory Visit: Payer: 59

## 2019-02-13 ENCOUNTER — Other Ambulatory Visit: Payer: Self-pay | Admitting: Obstetrics and Gynecology

## 2019-02-17 NOTE — Addendum Note (Signed)
Addended by: Edwyna Shell on: 02/17/2019 04:54 PM   Modules accepted: Orders

## 2019-03-03 DIAGNOSIS — G8929 Other chronic pain: Secondary | ICD-10-CM

## 2019-03-04 ENCOUNTER — Other Ambulatory Visit: Payer: Self-pay

## 2019-03-04 MED ORDER — OXYCODONE-ACETAMINOPHEN 5-325 MG PO TABS: 1.0000 | ORAL_TABLET | Freq: Four times a day (QID) | ORAL | 0 refills | Status: DC | PRN

## 2019-03-10 ENCOUNTER — Other Ambulatory Visit: Payer: Self-pay | Admitting: Obstetrics and Gynecology

## 2019-03-11 NOTE — Telephone Encounter (Signed)
Please advise. Thanks Minh Jasper 

## 2019-03-17 NOTE — Telephone Encounter (Signed)
Patient was supposed to f/u for an annual exam in November.  I will refill for now but she needs to be seen for an annual (which I'm sure is still out until February) and also prior to this for a pain management follow up.   Dr. Marcelline Mates

## 2019-03-19 NOTE — Telephone Encounter (Signed)
Pt has an appointment scheduled for May 01, 2019 for an annual exam.

## 2019-03-24 ENCOUNTER — Telehealth: Payer: Self-pay | Admitting: Obstetrics and Gynecology

## 2019-03-24 NOTE — Telephone Encounter (Signed)
error 

## 2019-03-27 NOTE — Telephone Encounter (Signed)
na

## 2019-04-07 ENCOUNTER — Other Ambulatory Visit: Payer: Self-pay | Admitting: Obstetrics and Gynecology

## 2019-04-18 LAB — HM HEPATITIS C SCREENING LAB: HM Hepatitis Screen: NEGATIVE

## 2019-04-18 LAB — HM HIV SCREENING LAB: HM HIV Screening: NEGATIVE

## 2019-04-22 ENCOUNTER — Ambulatory Visit: Payer: Self-pay

## 2019-05-01 ENCOUNTER — Encounter: Payer: 59 | Admitting: Obstetrics and Gynecology

## 2019-05-07 ENCOUNTER — Other Ambulatory Visit: Payer: Self-pay | Admitting: Obstetrics and Gynecology

## 2019-05-09 NOTE — Telephone Encounter (Signed)
I have refilled it, but it is time to schedule patient for a follow up appointment (she is supposed to be taking [place]) .

## 2019-05-09 NOTE — Telephone Encounter (Signed)
Dr. Cherry, Please advise. Thanks Lorinda Copland 

## 2019-05-12 NOTE — Telephone Encounter (Signed)
Send pt a mychart message to call the office to schedule an appointment with [facility] for medication review.

## 2019-05-21 ENCOUNTER — Other Ambulatory Visit: Payer: Self-pay | Admitting: Obstetrics and Gynecology

## 2019-05-21 ENCOUNTER — Other Ambulatory Visit: Payer: Self-pay

## 2019-05-22 ENCOUNTER — Other Ambulatory Visit: Payer: Self-pay

## 2019-05-24 ENCOUNTER — Other Ambulatory Visit: Payer: Self-pay | Admitting: Obstetrics and Gynecology

## 2019-05-24 MED ORDER — TRAMADOL HCL 50 MG PO TABS: 100.0000 mg | ORAL_TABLET | Freq: Four times a day (QID) | ORAL | 3 refills | Status: DC | PRN

## 2019-06-04 ENCOUNTER — Encounter: Payer: 59 | Admitting: Obstetrics and Gynecology

## 2019-06-18 ENCOUNTER — Encounter: Payer: Managed Care, Other (non HMO) | Admitting: Obstetrics and Gynecology

## 2019-06-24 ENCOUNTER — Other Ambulatory Visit: Payer: Self-pay | Admitting: Obstetrics and Gynecology

## 2019-06-24 ENCOUNTER — Ambulatory Visit: Payer: 59

## 2019-06-24 ENCOUNTER — Other Ambulatory Visit: Payer: Self-pay | Admitting: Podiatry

## 2019-06-24 ENCOUNTER — Ambulatory Visit (INDEPENDENT_AMBULATORY_CARE_PROVIDER_SITE_OTHER): Payer: 59 | Admitting: Podiatry

## 2019-06-24 ENCOUNTER — Ambulatory Visit (INDEPENDENT_AMBULATORY_CARE_PROVIDER_SITE_OTHER): Payer: 59

## 2019-06-24 ENCOUNTER — Other Ambulatory Visit: Payer: Self-pay

## 2019-06-24 DIAGNOSIS — M7671 Peroneal tendinitis, right leg: Secondary | ICD-10-CM

## 2019-06-24 DIAGNOSIS — M25571 Pain in right ankle and joints of right foot: Secondary | ICD-10-CM | POA: Diagnosis not present

## 2019-06-24 MED ORDER — MELOXICAM 15 MG PO TABS: 15.0000 mg | ORAL_TABLET | Freq: Every day | ORAL | 1 refills | Status: DC

## 2019-06-24 MED ORDER — METHYLPREDNISOLONE 4 MG PO TBPK: ORAL_TABLET | ORAL | 0 refills | Status: DC

## 2019-06-24 NOTE — Patient Instructions (Signed)
Peroneal tendinitis

## 2019-06-27 NOTE — Progress Notes (Signed)
   HPI: 39 y.o. female presenting today as a new patient with a chief complaint of throbbing pain noted to the lateral right ankle that began 2-3 months ago. She reports associated tightness and swelling and states it is difficult to move the ankle joint. Wearing certain shoes increases the pain. She has been elevating the ankle and taking Tylenol and Ibuprofen for treatment. Patient is here for further evaluation and treatment.   Past Medical History:  Diagnosis Date  . Abnormal Pap smear   . Anxiety   . Endometriosis   . Family history of hemochromatosis    mother   . HA (headache)    otc med prn  . Hyperlipidemia    diet controlled, no meds  . Hypertension   . PONV (postoperative nausea and vomiting)   . Pregnancy induced hypertension 2008     Physical Exam: General: The patient is alert and oriented x3 in no acute distress.  Dermatology: Skin is warm, dry and supple bilateral lower extremities. Negative for open lesions or macerations.  Vascular: Palpable pedal pulses bilaterally. No edema or erythema noted. Capillary refill within normal limits.  Neurological: Epicritic and protective threshold grossly intact bilaterally.   Musculoskeletal Exam: Pain with palpation to the peroneal tendon of the right foot. Range of motion within normal limits to all pedal and ankle joints bilateral. Muscle strength 5/5 in all groups bilateral.   Radiographic Exam:  Normal osseous mineralization. Joint spaces preserved. No fracture/dislocation/boney destruction.    Assessment: 1. Peroneal tendinitis right    Plan of Care:  1. Patient evaluated. X-Rays reviewed.  2. Injection of 0.5 mLs Celestone Soluspan injected into the peroneal tendon sheath of the right foot.  3. Prescription for Medrol Dose Pak provided to patient. 4. Prescription for Meloxicam provided to patient. 5. CAM boot dispensed.  6. Ankle brace also dispensed in case she is unable to use the CAM boot.  7. Return to  clinic in 4 weeks.   Works at [employer]. Daughter's name is Charity fundraiser.       Felecia Shelling, DPM [facility]  Dr. Felecia Shelling, DPM    [address] [place], [place] [address]                Office [phone]  Fax [phone]

## 2019-07-01 ENCOUNTER — Encounter: Payer: Self-pay | Admitting: Podiatry

## 2019-07-02 ENCOUNTER — Encounter: Payer: Self-pay | Admitting: Obstetrics and Gynecology

## 2019-07-03 ENCOUNTER — Encounter: Payer: Self-pay | Admitting: Podiatry

## 2019-07-04 ENCOUNTER — Other Ambulatory Visit: Payer: Self-pay | Admitting: Obstetrics and Gynecology

## 2019-07-04 NOTE — Telephone Encounter (Signed)
Please advise. Thanks Jo Booze 

## 2019-07-04 NOTE — Telephone Encounter (Signed)
Patient called in to follow-up with her prescription refill.

## 2019-07-09 ENCOUNTER — Other Ambulatory Visit: Payer: Self-pay | Admitting: Obstetrics and Gynecology

## 2019-07-09 DIAGNOSIS — N941 Unspecified dyspareunia: Secondary | ICD-10-CM

## 2019-07-09 DIAGNOSIS — R102 Pelvic and perineal pain: Secondary | ICD-10-CM

## 2019-07-09 DIAGNOSIS — Z90710 Acquired absence of both cervix and uterus: Secondary | ICD-10-CM

## 2019-07-09 DIAGNOSIS — G8929 Other chronic pain: Secondary | ICD-10-CM

## 2019-07-09 DIAGNOSIS — Z8742 Personal history of other diseases of the female genital tract: Secondary | ICD-10-CM

## 2019-07-09 DIAGNOSIS — M6289 Other specified disorders of muscle: Secondary | ICD-10-CM

## 2019-07-17 ENCOUNTER — Other Ambulatory Visit: Payer: Self-pay | Admitting: Obstetrics and Gynecology

## 2019-07-22 ENCOUNTER — Ambulatory Visit: Payer: 59 | Admitting: Podiatry

## 2019-07-28 ENCOUNTER — Other Ambulatory Visit: Payer: Self-pay | Admitting: Obstetrics and Gynecology

## 2019-08-05 ENCOUNTER — Ambulatory Visit: Payer: 59 | Admitting: Podiatry

## 2019-08-11 ENCOUNTER — Telehealth: Payer: Self-pay | Admitting: Family Medicine

## 2019-08-11 NOTE — Telephone Encounter (Signed)
Please call patient regarding scheduling appointment for blood pressure follow up. Per electronic medical record, she established with Dr. Dorris Fetch last August. If she plans to follow up for primary care with him, please remove my name as PCP.

## 2019-08-11 NOTE — Telephone Encounter (Signed)
I left message for patient to return phone call.   

## 2019-08-11 NOTE — Telephone Encounter (Signed)
Message left for patient to return my call.  

## 2019-08-18 ENCOUNTER — Other Ambulatory Visit: Payer: Self-pay | Admitting: Obstetrics and Gynecology

## 2019-08-21 NOTE — Telephone Encounter (Signed)
Left message for [name] to please call the office or send a MyChart message to let us know if she has changed PCPs to Dr. Dorris Fetch or other wise she needs to schedule appointment with [name] to follow up on her blood pressure.

## 2019-08-22 ENCOUNTER — Encounter: Payer: Self-pay | Admitting: *Deleted

## 2019-08-22 ENCOUNTER — Encounter: Payer: Self-pay | Admitting: Family Medicine

## 2019-08-22 NOTE — Telephone Encounter (Signed)
Left detailed message on voicemail, sent MyChart message and letter.

## 2019-08-27 ENCOUNTER — Other Ambulatory Visit: Payer: Self-pay

## 2019-08-27 ENCOUNTER — Ambulatory Visit (INDEPENDENT_AMBULATORY_CARE_PROVIDER_SITE_OTHER): Payer: 59 | Admitting: Obstetrics and Gynecology

## 2019-08-27 ENCOUNTER — Encounter: Payer: Self-pay | Admitting: Obstetrics and Gynecology

## 2019-08-27 VITALS — BP 152/97 | HR 90 | Ht 65.0 in | Wt 230.2 lb

## 2019-08-27 DIAGNOSIS — Z8742 Personal history of other diseases of the female genital tract: Secondary | ICD-10-CM | POA: Diagnosis not present

## 2019-08-27 DIAGNOSIS — Z90710 Acquired absence of both cervix and uterus: Secondary | ICD-10-CM | POA: Diagnosis not present

## 2019-08-27 DIAGNOSIS — G8929 Other chronic pain: Secondary | ICD-10-CM

## 2019-08-27 DIAGNOSIS — R102 Pelvic and perineal pain: Secondary | ICD-10-CM

## 2019-08-27 DIAGNOSIS — Z90721 Acquired absence of ovaries, unilateral: Secondary | ICD-10-CM | POA: Diagnosis not present

## 2019-08-27 DIAGNOSIS — N941 Unspecified dyspareunia: Secondary | ICD-10-CM

## 2019-08-27 MED ORDER — TRAMADOL HCL 50 MG PO TABS: ORAL_TABLET | ORAL | 1 refills | Status: DC

## 2019-08-27 NOTE — Progress Notes (Signed)
Pt present for medication refill and discuss other options for pain management. Pt has an appointment with pain management in July 2021. Pt's BP was elevated due to pt stating that she is currently out of one of her BP medication.

## 2019-08-27 NOTE — Progress Notes (Signed)
    GYNECOLOGY PROGRESS NOTE  Subjective:    Patient ID: [name], female    DOB: 1981/02/21, 39 y.o.   MRN: [id number]  HPI  Patient is a 39 y.o. G44P1102 female who presents for follow up of persistent pelvic pain, dyspareunia. Notes she had to cancel last appointment as her daughter had COVID exposure and did not have any other childcare. She currently takes Tramadol for pain management.  States that she has begun physical therapy. Physical therapy has been painful so far abdominally, however plans soon for internal intervention. Reports that she stopped Dewayne Hatch a "little while back" as it was not helping.  Has an appointment with a pelvic pain specialist at [facility] in July. Needs refill on pain medication.   The following portions of the patient's history were reviewed and updated as appropriate: allergies, current medications, past family history, past medical history, past social history, past surgical history and problem list.  Review of Systems Pertinent items noted in HPI and remainder of comprehensive ROS otherwise negative.   Objective:   Blood pressure (!) 152/97, pulse 90, height 5\' 5"  (1.651 m), weight 230 lb 3.2 oz (104.4 kg), last menstrual period 10/15/2013. General appearance: alert, cooperative and no distress Abdomen: soft, non-tender; bowel sounds normal; no masses,  no organomegaly Pelvic: deferred   Assessment:   1. Chronic pelvic pain in female   2. History of endometriosis   3. Dyspareunia in female   4. S/P hysterectomy with oophorectomy      Plan:  - Refill given on Tramadol today. - Encouraged to keep appointment with specialist, continue with physical therapy.  - To f/u as needed.    12/16/2013, MD [facility]

## 2019-09-16 ENCOUNTER — Other Ambulatory Visit: Payer: Self-pay | Admitting: Obstetrics and Gynecology

## 2019-09-17 ENCOUNTER — Other Ambulatory Visit: Payer: Self-pay | Admitting: Obstetrics and Gynecology

## 2019-09-17 MED ORDER — OXYCODONE-ACETAMINOPHEN 5-325 MG PO TABS: 1.0000 | ORAL_TABLET | Freq: Four times a day (QID) | ORAL | 0 refills | Status: DC | PRN

## 2019-09-17 NOTE — Telephone Encounter (Signed)
Please advise. Thanks Fikisha 

## 2019-09-17 NOTE — Telephone Encounter (Signed)
No further refills

## 2019-09-30 NOTE — Progress Notes (Deleted)
Scheduled to complete physical 10/08/19 - No provider. Needs to reschedule provider visit.  AMD

## 2019-10-06 ENCOUNTER — Other Ambulatory Visit: Payer: Self-pay | Admitting: Obstetrics and Gynecology

## 2019-10-06 NOTE — Telephone Encounter (Signed)
Please advise. Thanks Fikisha 

## 2019-10-24 ENCOUNTER — Ambulatory Visit: Payer: Self-pay | Admitting: Physician Assistant

## 2019-10-24 ENCOUNTER — Encounter: Payer: Self-pay | Admitting: Physician Assistant

## 2019-10-24 ENCOUNTER — Other Ambulatory Visit: Payer: Self-pay

## 2019-10-24 VITALS — BP 187/90 | HR 82 | Temp 98.8°F | Resp 16 | Ht 65.0 in | Wt 220.0 lb

## 2019-10-24 DIAGNOSIS — Z760 Encounter for issue of repeat prescription: Secondary | ICD-10-CM

## 2019-10-24 DIAGNOSIS — F320 Major depressive disorder, single episode, mild: Secondary | ICD-10-CM

## 2019-10-24 DIAGNOSIS — F411 Generalized anxiety disorder: Secondary | ICD-10-CM

## 2019-10-24 DIAGNOSIS — I10 Essential (primary) hypertension: Secondary | ICD-10-CM

## 2019-10-24 MED ORDER — LOSARTAN POTASSIUM-HCTZ 100-25 MG PO TABS: 1.0000 | ORAL_TABLET | Freq: Every day | ORAL | 3 refills | Status: DC

## 2019-10-24 MED ORDER — AMLODIPINE BESYLATE 10 MG PO TABS: 10.0000 mg | ORAL_TABLET | Freq: Every day | ORAL | 3 refills | Status: DC

## 2019-10-24 MED ORDER — METOPROLOL SUCCINATE ER 50 MG PO TB24: 50.0000 mg | ORAL_TABLET | Freq: Every day | ORAL | 3 refills | Status: DC

## 2019-10-24 MED ORDER — HYDROXYZINE HCL 25 MG PO TABS: 25.0000 mg | ORAL_TABLET | Freq: Three times a day (TID) | ORAL | 0 refills | Status: DC | PRN

## 2019-10-24 NOTE — Progress Notes (Signed)
   Subjective: Medication refill    Patient ID: [name], female    DOB: [birth year], 39 y.o.   MRN: [id number]  HPI Patient presents for medication refill for hypertension.  Patient has been out of medication for 3 weeks.  Patient states she is going through some life stressors and is in counseling.  Patient states she is having anxiety and depression.  Patient also state intimating episodes of insomnia.   Review of Systems Hypertension    Objective:   Physical Exam Appears anxious.  BP is 187/90.  HEENT is unremarkable.  Neck is supple for adenopathy or bruits.  Lungs clear to auscultation.  Heart is regular rate and rhythm.       Assessment & Plan: Hypertension and anxiety.  Patient blood pressure meds to be refilled.  Patient given prescription for Atarax 25 mg take 1 p.o. 3 times daily as needed anxiety.  Patient may take 50 mg at night for insomnia.  Advise continue counseling follow-up as needed.

## 2019-10-24 NOTE — Progress Notes (Signed)
Pt states lostartan-hctz ,Metoprolol, amlodipine needs to be refilled.

## 2019-10-30 ENCOUNTER — Ambulatory Visit: Payer: Self-pay

## 2019-11-12 ENCOUNTER — Other Ambulatory Visit: Payer: Self-pay | Admitting: Obstetrics and Gynecology

## 2019-11-13 ENCOUNTER — Other Ambulatory Visit: Payer: Self-pay | Admitting: Obstetrics and Gynecology

## 2019-11-14 NOTE — Telephone Encounter (Signed)
Pt called in and stated that she sent a mychart message and that she was going out of town and wanted to know if she would hear from one of them by today. I told her that it will be 24-48 hours. I told the pt I will send a message that she called in and wanted to know the ET on the refill.

## 2019-11-14 NOTE — Telephone Encounter (Signed)
Please advise 

## 2019-11-17 ENCOUNTER — Telehealth: Payer: Self-pay | Admitting: Obstetrics and Gynecology

## 2019-11-17 NOTE — Telephone Encounter (Deleted)
Pt called in and stated that she needs a refill of tramadol sent to [facility] at [address] [place]. The pt also stated that she has been going back and forward on my chart with the provider. The pt stated that she wanted to get this done today since its been going on since last week. I told the pt I was sorry for the inconvenience but when we take a telephone message its 24-48 hours. I told her I will send the message, the pt verbally understood, please advise  

## 2019-11-17 NOTE — Telephone Encounter (Signed)
Pt called in and stated that she needs a refill of tramadol sent to [facility] at [address] [place]. The pt also stated that she has been going back and forward on my chart with the provider. The pt stated that she wanted to get this done today since its been going on since last week. I told the pt I was sorry for the inconvenience but when we take a telephone message its 24-48 hours. I told her I will send the message, the pt verbally understood, please advise

## 2019-11-18 ENCOUNTER — Other Ambulatory Visit: Payer: Self-pay | Admitting: Obstetrics and Gynecology

## 2019-11-18 MED ORDER — TRAMADOL HCL 50 MG PO TABS: 100.0000 mg | ORAL_TABLET | Freq: Four times a day (QID) | ORAL | 1 refills | Status: DC | PRN

## 2019-11-18 NOTE — Telephone Encounter (Signed)
Please advise. Thanks Dacota Devall 

## 2019-11-27 ENCOUNTER — Other Ambulatory Visit: Payer: Self-pay | Admitting: Obstetrics and Gynecology

## 2019-12-10 DIAGNOSIS — F4310 Post-traumatic stress disorder, unspecified: Secondary | ICD-10-CM | POA: Diagnosis not present

## 2019-12-16 ENCOUNTER — Other Ambulatory Visit: Payer: Self-pay | Admitting: Obstetrics and Gynecology

## 2019-12-16 NOTE — Telephone Encounter (Signed)
Please advise. Thanks Ericha Whittingham 

## 2019-12-23 ENCOUNTER — Encounter: Payer: 59 | Admitting: Family Medicine

## 2019-12-23 ENCOUNTER — Encounter: Payer: Self-pay | Admitting: Family Medicine

## 2019-12-23 NOTE — Progress Notes (Signed)
Patient did not keep appointment today. She may call to reschedule.  

## 2019-12-25 ENCOUNTER — Other Ambulatory Visit: Payer: Self-pay | Admitting: Obstetrics and Gynecology

## 2019-12-25 NOTE — Telephone Encounter (Signed)
Please advise. Thanks Vlad Mayberry 

## 2019-12-27 NOTE — Telephone Encounter (Signed)
I have refilled this patient's prescription. Please inform that per patient's previous request, her refills will end at the end of September as she should have transitioned to her new pain management plan.

## 2019-12-29 NOTE — Telephone Encounter (Signed)
Pt is aware via my-chart.

## 2020-01-05 ENCOUNTER — Other Ambulatory Visit: Payer: Self-pay | Admitting: Obstetrics and Gynecology

## 2020-01-14 ENCOUNTER — Other Ambulatory Visit: Payer: Self-pay

## 2020-01-14 NOTE — Telephone Encounter (Signed)
Please advise. Thanks Reagann Dolce 

## 2020-01-16 ENCOUNTER — Other Ambulatory Visit: Payer: Self-pay

## 2020-01-16 ENCOUNTER — Other Ambulatory Visit: Payer: Self-pay | Admitting: Obstetrics and Gynecology

## 2020-01-17 MED ORDER — TRAMADOL HCL 50 MG PO TABS
100.0000 mg | ORAL_TABLET | Freq: Four times a day (QID) | ORAL | 0 refills | Status: DC | PRN
Start: 2020-01-17 — End: 2020-01-27

## 2020-01-18 DIAGNOSIS — Z03818 Encounter for observation for suspected exposure to other biological agents ruled out: Secondary | ICD-10-CM | POA: Diagnosis not present

## 2020-01-18 DIAGNOSIS — A084 Viral intestinal infection, unspecified: Secondary | ICD-10-CM | POA: Diagnosis not present

## 2020-01-27 ENCOUNTER — Other Ambulatory Visit: Payer: Self-pay | Admitting: Obstetrics and Gynecology

## 2020-01-27 DIAGNOSIS — G8929 Other chronic pain: Secondary | ICD-10-CM

## 2020-01-28 ENCOUNTER — Other Ambulatory Visit: Payer: Self-pay | Admitting: Obstetrics and Gynecology

## 2020-02-04 ENCOUNTER — Other Ambulatory Visit: Payer: Self-pay | Admitting: Obstetrics and Gynecology

## 2020-02-05 ENCOUNTER — Other Ambulatory Visit: Payer: Self-pay

## 2020-02-08 ENCOUNTER — Other Ambulatory Visit: Payer: Self-pay

## 2020-02-10 ENCOUNTER — Other Ambulatory Visit: Payer: Self-pay

## 2020-02-12 NOTE — Telephone Encounter (Signed)
Please advise 

## 2020-02-17 ENCOUNTER — Telehealth: Payer: Self-pay

## 2020-02-17 NOTE — Telephone Encounter (Signed)
Call transferred from front desk-   Suzie from [facility] states they are not accepting new pts until 04/2020.   She also stated that in 04/2020 they may not take outside referral. She states the pt was more than welcome to call back in 04/2020 for an update.   Called pt- LMTRC.

## 2020-02-18 ENCOUNTER — Ambulatory Visit: Payer: Self-pay

## 2020-02-18 ENCOUNTER — Other Ambulatory Visit: Payer: Self-pay

## 2020-02-18 DIAGNOSIS — Z23 Encounter for immunization: Secondary | ICD-10-CM

## 2020-02-20 ENCOUNTER — Other Ambulatory Visit: Payer: Self-pay | Admitting: Obstetrics and Gynecology

## 2020-02-20 NOTE — Telephone Encounter (Signed)
Pt states she has received an referral from Dr. Iona Hansen at [facility].   She no longer needs [facility] to refer her anywhere. Encourage pt to let us know when she gets an appt with the new pain management office.  Pt voices understanding.

## 2020-03-29 ENCOUNTER — Other Ambulatory Visit: Payer: Self-pay | Admitting: Obstetrics and Gynecology

## 2020-04-13 ENCOUNTER — Other Ambulatory Visit: Payer: Self-pay

## 2020-04-13 ENCOUNTER — Other Ambulatory Visit: Payer: Self-pay | Admitting: Physician Assistant

## 2020-04-13 DIAGNOSIS — Z1152 Encounter for screening for COVID-19: Secondary | ICD-10-CM

## 2020-04-13 LAB — POCT INFLUENZA A/B
Influenza A, POC: NEGATIVE
Influenza B, POC: NEGATIVE

## 2020-04-13 MED ORDER — HYDROCOD POLST-CPM POLST ER 10-8 MG/5ML PO SUER: 5.0000 mL | Freq: Two times a day (BID) | ORAL | 0 refills | Status: DC

## 2020-04-13 NOTE — Progress Notes (Signed)
04/10/20 symptoms started. Congestion, cough, fever, body ache, head ache nausea diarrhea. Pt was tested for covid and the flu. CL,RMA

## 2020-04-15 LAB — NOVEL CORONAVIRUS, NAA: SARS-CoV-2, NAA: DETECTED — AB

## 2020-04-15 LAB — SARS-COV-2, NAA 2 DAY TAT

## 2020-04-22 ENCOUNTER — Other Ambulatory Visit: Payer: Self-pay | Admitting: Physician Assistant

## 2020-04-22 ENCOUNTER — Ambulatory Visit: Payer: Self-pay | Admitting: Physician Assistant

## 2020-04-22 ENCOUNTER — Encounter: Payer: Self-pay | Admitting: Physician Assistant

## 2020-04-22 ENCOUNTER — Other Ambulatory Visit: Payer: Self-pay

## 2020-04-22 VITALS — BP 152/114 | HR 86 | Temp 97.6°F | Ht 65.0 in | Wt 224.6 lb

## 2020-04-22 DIAGNOSIS — S39012A Strain of muscle, fascia and tendon of lower back, initial encounter: Secondary | ICD-10-CM

## 2020-04-22 MED ORDER — NAPROXEN 500 MG PO TABS: 500.0000 mg | ORAL_TABLET | Freq: Two times a day (BID) | ORAL | 0 refills | Status: DC

## 2020-04-22 MED ORDER — CLOTRIMAZOLE-BETAMETHASONE 1-0.05 % EX CREA: TOPICAL_CREAM | CUTANEOUS | 1 refills | Status: DC

## 2020-04-22 MED ORDER — ORPHENADRINE CITRATE ER 100 MG PO TB12: 100.0000 mg | ORAL_TABLET | Freq: Two times a day (BID) | ORAL | 0 refills | Status: DC

## 2020-04-22 NOTE — Progress Notes (Signed)
   Subjective: Low back pain    Patient ID: [name], female    DOB: 08-11-1980, 40 y.o.   MRN: [id number]  HPI Patient presents with right low back pain secondary to a pulling incident.  Patient states she recently was diagnosed and recovered from COVID-19.  Patient denies radicular component to her back pain.  Patient denies bladder bowel dysfunction.  Patient rates the pain as a 5/10.  Patient described pain is achy/spasmatic.  Patient states pain increased with movement.  Patient the pain is most noticeable when stepping down.  Patient state mild relief with over-the-counter anti-inflammatory medications.   Review of Systems    Anxiety and hypertension. Objective:   Physical Exam No acute distress.  Patient ambulates with normal gait.  Patient is able to step up on the exam table without discomfort.  In the standing position no obvious deformity of the lumbar spine.  Patient has no guarding is palpation of lumbar spinal processes.  Patient has decreased range of motion with left lateral movements.  Palpable muscle spasm right paraspinal area with lateral movements.  Patient flexes and extends without discomfort.  In the supine patient patient is no leg length discrepancy.  Patient has negative straight leg test bilaterally.  Patient hip flexor asymptomatic me.       Assessment & Plan: Lumbar strain.  Patient given discharge care instructions and advised take medication as directed.  It was noted patient blood pressure is elevated she is on 2 blood pressure medications.  Patient will perform a 3-day blood pressure check at this facility along with her home unit.  Patient follow-up with Dr. After 3-day blood pressure check.  Return immediately if develop headache, vertigo,or weakness.  Patient given a prescription for naproxen and Norflex.  Will reevaluate patient back pain after 3-day blood pressure check.

## 2020-04-22 NOTE — Progress Notes (Unsigned)
Clot

## 2020-05-10 ENCOUNTER — Other Ambulatory Visit: Payer: Self-pay | Admitting: Obstetrics and Gynecology

## 2020-05-11 NOTE — Telephone Encounter (Signed)
Please advise. Thanks Kooper Godshall 

## 2020-05-25 ENCOUNTER — Telehealth: Payer: Self-pay | Admitting: Obstetrics and Gynecology

## 2020-05-25 ENCOUNTER — Ambulatory Visit: Payer: Self-pay | Admitting: Adult Medicine

## 2020-05-25 ENCOUNTER — Other Ambulatory Visit: Payer: Self-pay

## 2020-05-25 ENCOUNTER — Encounter: Payer: Self-pay | Admitting: Adult Medicine

## 2020-05-25 VITALS — BP 143/101 | HR 96 | Temp 99.6°F | Resp 14 | Ht 65.0 in | Wt 222.0 lb

## 2020-05-25 DIAGNOSIS — Z760 Encounter for issue of repeat prescription: Secondary | ICD-10-CM

## 2020-05-25 NOTE — Progress Notes (Signed)
Subjective:    Patient ID: [name], female    DOB: 06/15/1980, 40 y.o.   MRN: [id number] CC: medication refill  HPI  PMH 39 yr with back pain due to endometriosis she has a child but has been under care of her Gyn x5 med include longterm pain mgmt for which client states she will no longer file. She come to [facility] for refill of pain meds Hx tah bso  Review of Systems Related to hpi    Objective:   Physical Exam   Hi bmi bp 143/101 Anxious, not in obvious pain, lucid historian Heent grossly nl Pulml Cardia grossly nl Abd nl    Extr  Neuro grossly nl Component Ref Range & Units 1 yr ago  (11/29/18) 2 yr ago  (03/03/18)  Glucose 65 - 99 mg/dL 81  542HCWC R   Uric Acid 2.5 - 7.1 mg/dL 5.5    BUN 6 - 20 mg/dL 15  12   Creatinine, Ser 0.57 - 1.00 mg/dL 3.76  2.83 R   GFR calc non Af Amer >59 mL/min/1.73 100  >60 R   GFR calc Af Amer >59 mL/min/1.73 115  >60 R, CM   BUN/Creatinine Ratio 9 - 23 19    Sodium 134 - 144 mmol/L 139  137 R   Potassium 3.5 - 5.2 mmol/L 4.5  3.2Low R   Chloride 96 - 106 mmol/L 100  102 R   Calcium 8.7 - 10.2 mg/dL 9.6  9.2 R   Phosphorus 3.0 - 4.3 mg/dL 3.9    Total Protein 6.0 - 8.5 g/dL 7.2    Albumin 3.8 - 4.8 g/dL 4.4    Globulin, Total 1.5 - 4.5 g/dL 2.8    Albumin/Globulin Ratio 1.2 - 2.2 1.6    Bilirubin Total 0.0 - 1.2 mg/dL 0.3    Alkaline Phosphatase 39 - 117 IU/L 60    LDH 119 - 226 IU/L 182    AST 0 - 40 IU/L 22    ALT 0 - 32 IU/L 26    GGT 0 - 60 IU/L 53    Iron 27 - 159 ug/dL 151    Cholesterol, Total 100 - 199 mg/dL [id number]    Triglycerides 0 - 149 mg/dL 710GYIR    HDL >48 mg/dL 48    VLDL Cholesterol Cal 5 - 40 mg/dL Comment    LDL Calculated 0 - 99 mg/dL Comment    Chol/HDL Ratio 0.0 - 4.4 ratio 5.4OEVO    Estimated CHD Risk 0.0 - 1.0 times avg. 1.2High    TSH 0.450 - 4.500 uIU/mL 6.040High    T4, Total 4.5 - 12.0 ug/dL 5.4    T3 Uptake Ratio 24 - 39 % 23Low    Free Thyroxine Index 1.2 - 4.9 1.2           Assessment & Plan:  Med refill Endometriosis pain tramadol 50mg  q6  Former ob/gyn who patient reports will not continue after 60yr of this intervention to pain mgmt Patient did not reveal she had been flagged poss med abuse. Review of old charting from [facility] reveal 2 separate episode of entries into the [place] database This is a hard stop. Client is scheduled for Ob surgery this spring and pain care to be managed by pain specialist   f/u with Pain Mgmt specialist may review annual exam f/u with each above to includ  Old Chart Review  Hx  Elevate bp,  Bs, tsh hyperlipidemia: low  t3 uptake-repeat specialized thyroid  lab -3day home check bp comparison with office values prior to annual exam

## 2020-05-25 NOTE — Telephone Encounter (Signed)
Patient called with concerns about prescription "tramadol", pt states she is having surgery in March and that she went to PCP who states they can not assist with medication since Dr.Cherry was prescribe. Pt requesting call back as soon as possible.

## 2020-05-26 ENCOUNTER — Ambulatory Visit: Payer: Self-pay

## 2020-05-26 ENCOUNTER — Other Ambulatory Visit: Payer: Self-pay | Admitting: Obstetrics and Gynecology

## 2020-05-26 VITALS — BP 150/100

## 2020-05-26 DIAGNOSIS — Z0130 Encounter for examination of blood pressure without abnormal findings: Secondary | ICD-10-CM

## 2020-05-26 NOTE — Progress Notes (Addendum)
Pt concerned of having heart burn with symptoms of chest pains and spreading out towards shoulders. Pt did have a slice a of pizza that could of started it. She's also on 3 bp medications. Pt advised to check her bp through out the day to compare in the office. Offered pt to see dr. holder today and pt refused due to having to go back to work. Pt will have apt at 1:15pm 05/27/20. [name]

## 2020-05-26 NOTE — Telephone Encounter (Signed)
Please advise. [name] 

## 2020-05-27 ENCOUNTER — Ambulatory Visit: Payer: 59 | Admitting: Adult Medicine

## 2020-05-27 MED ORDER — GABAPENTIN 300 MG PO CAPS: 300.0000 mg | ORAL_CAPSULE | Freq: Three times a day (TID) | ORAL | 0 refills | Status: DC

## 2020-05-27 NOTE — Telephone Encounter (Signed)
Please advise. Thanks [name] 

## 2020-06-01 ENCOUNTER — Ambulatory Visit: Payer: Self-pay | Admitting: Adult Medicine

## 2020-06-13 DIAGNOSIS — H9209 Otalgia, unspecified ear: Secondary | ICD-10-CM | POA: Diagnosis not present

## 2020-06-13 DIAGNOSIS — Z880 Allergy status to penicillin: Secondary | ICD-10-CM | POA: Diagnosis not present

## 2020-06-13 DIAGNOSIS — I10 Essential (primary) hypertension: Secondary | ICD-10-CM | POA: Diagnosis not present

## 2020-06-13 DIAGNOSIS — R519 Headache, unspecified: Secondary | ICD-10-CM | POA: Diagnosis not present

## 2020-06-13 DIAGNOSIS — Z882 Allergy status to sulfonamides status: Secondary | ICD-10-CM | POA: Diagnosis not present

## 2020-06-13 DIAGNOSIS — Z79899 Other long term (current) drug therapy: Secondary | ICD-10-CM | POA: Diagnosis not present

## 2020-06-13 DIAGNOSIS — R42 Dizziness and giddiness: Secondary | ICD-10-CM | POA: Diagnosis not present

## 2020-06-13 DIAGNOSIS — H538 Other visual disturbances: Secondary | ICD-10-CM | POA: Diagnosis not present

## 2020-06-14 ENCOUNTER — Telehealth: Payer: Self-pay

## 2020-06-14 ENCOUNTER — Ambulatory Visit: Payer: 59 | Admitting: Emergency Medicine

## 2020-06-14 NOTE — Telephone Encounter (Signed)
[name] called to cancel the 2:15pm appt scheduled today due to family needs. She will reschedule later this week.

## 2020-06-14 NOTE — Telephone Encounter (Signed)
Aleena called to report she experienced severe headache over weekend. BP was assessed at 182/131. Lia reports that FD confirmed the BP reading and she went to Hospital. Bailey states that hospital treated her for Migraine and ordered New Rx Fioricet. She also states that they administered her evening dose of Amlodipine while in the ER.  She is questioning if she should take all 3 BP meds (Metoprolol, Losarten and Amlodipine) all in the AM together or if she should continue to space them out as she currently does - Metoprolol / Losarten in AM and Amlodipine in PM. She also asked if Fioricet should be taken while working. She has not prior Hx of Migraine. Appointment scheduled today at 2:15pm with FNP for follow up on BP and address questions. Ashely reports baselline BP readings remain 140s/90s despite being on 3 medications for BP management.

## 2020-06-15 DIAGNOSIS — R1031 Right lower quadrant pain: Secondary | ICD-10-CM | POA: Diagnosis not present

## 2020-06-15 DIAGNOSIS — I10 Essential (primary) hypertension: Secondary | ICD-10-CM | POA: Diagnosis not present

## 2020-06-15 DIAGNOSIS — K660 Peritoneal adhesions (postprocedural) (postinfection): Secondary | ICD-10-CM | POA: Diagnosis not present

## 2020-06-15 DIAGNOSIS — G8929 Other chronic pain: Secondary | ICD-10-CM | POA: Diagnosis not present

## 2020-06-15 DIAGNOSIS — Z79891 Long term (current) use of opiate analgesic: Secondary | ICD-10-CM | POA: Diagnosis not present

## 2020-06-15 DIAGNOSIS — Z9189 Other specified personal risk factors, not elsewhere classified: Secondary | ICD-10-CM | POA: Diagnosis not present

## 2020-06-15 DIAGNOSIS — R102 Pelvic and perineal pain: Secondary | ICD-10-CM | POA: Diagnosis not present

## 2020-06-15 DIAGNOSIS — Z8742 Personal history of other diseases of the female genital tract: Secondary | ICD-10-CM | POA: Diagnosis not present

## 2020-06-15 DIAGNOSIS — F419 Anxiety disorder, unspecified: Secondary | ICD-10-CM | POA: Diagnosis not present

## 2020-06-15 DIAGNOSIS — M549 Dorsalgia, unspecified: Secondary | ICD-10-CM | POA: Diagnosis not present

## 2020-06-15 DIAGNOSIS — Z90710 Acquired absence of both cervix and uterus: Secondary | ICD-10-CM | POA: Diagnosis not present

## 2020-06-15 DIAGNOSIS — L905 Scar conditions and fibrosis of skin: Secondary | ICD-10-CM | POA: Diagnosis not present

## 2020-06-15 DIAGNOSIS — Z880 Allergy status to penicillin: Secondary | ICD-10-CM | POA: Diagnosis not present

## 2020-06-15 DIAGNOSIS — N809 Endometriosis, unspecified: Secondary | ICD-10-CM | POA: Diagnosis not present

## 2020-06-15 DIAGNOSIS — G894 Chronic pain syndrome: Secondary | ICD-10-CM | POA: Diagnosis not present

## 2020-06-15 DIAGNOSIS — Z90722 Acquired absence of ovaries, bilateral: Secondary | ICD-10-CM | POA: Diagnosis not present

## 2020-06-16 DIAGNOSIS — G479 Sleep disorder, unspecified: Secondary | ICD-10-CM

## 2020-06-17 MED ORDER — ZOLPIDEM TARTRATE 5 MG PO TABS: 5.0000 mg | ORAL_TABLET | Freq: Every evening | ORAL | 1 refills | Status: DC | PRN

## 2020-06-29 ENCOUNTER — Other Ambulatory Visit: Payer: Self-pay

## 2020-06-29 ENCOUNTER — Encounter: Payer: Self-pay | Admitting: Family Medicine

## 2020-06-29 ENCOUNTER — Encounter: Payer: 59 | Admitting: Family Medicine

## 2020-06-29 NOTE — Progress Notes (Unsigned)
Patient did not keep appointment today. She may call to reschedule.  

## 2020-06-30 DIAGNOSIS — I10 Essential (primary) hypertension: Secondary | ICD-10-CM | POA: Diagnosis not present

## 2020-06-30 DIAGNOSIS — E785 Hyperlipidemia, unspecified: Secondary | ICD-10-CM | POA: Diagnosis not present

## 2020-06-30 DIAGNOSIS — E669 Obesity, unspecified: Secondary | ICD-10-CM | POA: Diagnosis not present

## 2020-07-01 ENCOUNTER — Other Ambulatory Visit: Payer: Self-pay

## 2020-07-02 MED ORDER — GABAPENTIN 300 MG PO CAPS: 300.0000 mg | ORAL_CAPSULE | Freq: Three times a day (TID) | ORAL | 3 refills | Status: DC

## 2020-07-02 NOTE — Telephone Encounter (Signed)
Please advise. Thanks Fikisha 

## 2020-07-06 NOTE — Telephone Encounter (Signed)
I see that you refilled the medication on 07/02/2020 but this medication is still in the rx request list. Please advise. Thanks [place]

## 2020-07-09 DIAGNOSIS — R102 Pelvic and perineal pain: Secondary | ICD-10-CM | POA: Diagnosis not present

## 2020-07-09 DIAGNOSIS — N809 Endometriosis, unspecified: Secondary | ICD-10-CM | POA: Diagnosis not present

## 2020-07-09 DIAGNOSIS — Z09 Encounter for follow-up examination after completed treatment for conditions other than malignant neoplasm: Secondary | ICD-10-CM | POA: Diagnosis not present

## 2020-07-09 DIAGNOSIS — G8929 Other chronic pain: Secondary | ICD-10-CM | POA: Diagnosis not present

## 2020-07-14 DIAGNOSIS — D649 Anemia, unspecified: Secondary | ICD-10-CM | POA: Diagnosis not present

## 2020-07-14 DIAGNOSIS — R7302 Impaired glucose tolerance (oral): Secondary | ICD-10-CM | POA: Diagnosis not present

## 2020-07-14 DIAGNOSIS — E785 Hyperlipidemia, unspecified: Secondary | ICD-10-CM | POA: Diagnosis not present

## 2020-07-14 DIAGNOSIS — I10 Essential (primary) hypertension: Secondary | ICD-10-CM | POA: Diagnosis not present

## 2020-08-10 ENCOUNTER — Encounter: Payer: Self-pay | Admitting: Emergency Medicine

## 2020-08-10 ENCOUNTER — Ambulatory Visit: Payer: Self-pay | Admitting: Emergency Medicine

## 2020-08-10 ENCOUNTER — Other Ambulatory Visit: Payer: Self-pay

## 2020-08-10 VITALS — BP 132/95 | HR 85 | Temp 98.5°F | Resp 14 | Ht 66.0 in | Wt 238.0 lb

## 2020-08-10 DIAGNOSIS — L309 Dermatitis, unspecified: Secondary | ICD-10-CM

## 2020-08-10 MED ORDER — TRIAMCINOLONE ACETONIDE 0.1 % EX LOTN: 1.0000 "application " | TOPICAL_LOTION | Freq: Three times a day (TID) | CUTANEOUS | 0 refills | Status: DC

## 2020-08-10 NOTE — Progress Notes (Signed)
  Subjective:     Patient ID: [name], female   DOB: [birth year], 40 y.o.   MRN: [id number]  HPI Rash to hands.  Hx of eczema.    Review of Systems No fever    Objective:   Physical Exam Palm of the hands bilaterally are extremely dry and scaly.  Areas of peeling skin.  No erythema, drainage or pustules are noted.    Assessment:     Hand dermatitis    Plan:     Patient will begin using triamcinolone cream to mix with Eucerin one-to-one to see if this is helpful.

## 2020-08-20 DIAGNOSIS — R3915 Urgency of urination: Secondary | ICD-10-CM | POA: Diagnosis not present

## 2020-08-25 DIAGNOSIS — R7302 Impaired glucose tolerance (oral): Secondary | ICD-10-CM | POA: Diagnosis not present

## 2020-09-18 ENCOUNTER — Other Ambulatory Visit: Payer: Self-pay | Admitting: Physician Assistant

## 2020-11-04 ENCOUNTER — Telehealth: Payer: Self-pay

## 2020-11-04 ENCOUNTER — Other Ambulatory Visit: Payer: Self-pay | Admitting: Obstetrics and Gynecology

## 2020-11-04 DIAGNOSIS — R0683 Snoring: Secondary | ICD-10-CM | POA: Diagnosis not present

## 2020-11-04 DIAGNOSIS — E1165 Type 2 diabetes mellitus with hyperglycemia: Secondary | ICD-10-CM | POA: Diagnosis not present

## 2020-11-04 DIAGNOSIS — I10 Essential (primary) hypertension: Secondary | ICD-10-CM | POA: Diagnosis not present

## 2020-11-04 DIAGNOSIS — R519 Headache, unspecified: Secondary | ICD-10-CM | POA: Diagnosis not present

## 2020-11-04 NOTE — Telephone Encounter (Cosign Needed)
Patient called in wondering why [facility] name was no longer on her mychart list. Pt was informed that since its been over a year that a provider name will not show up. Pt stated having right side pain; pain level 7-8; not relief by OTC pain medication tylenol and ibuprofen. Pt was requesting a refill of Gabapentin; taking one tablet three times a day.  Pt has an appointment for annual exam scheduled for 03/01/2021 at 3pm.

## 2020-11-05 ENCOUNTER — Other Ambulatory Visit: Payer: Self-pay

## 2020-11-05 MED ORDER — DULOXETINE HCL 20 MG PO CPEP: 20.0000 mg | ORAL_CAPSULE | Freq: Every day | ORAL | 11 refills | Status: DC

## 2020-11-09 ENCOUNTER — Other Ambulatory Visit: Payer: Self-pay

## 2020-11-09 MED ORDER — GABAPENTIN 300 MG PO CAPS: 300.0000 mg | ORAL_CAPSULE | Freq: Three times a day (TID) | ORAL | 0 refills | Status: DC

## 2020-11-11 MED ORDER — TRAMADOL HCL 50 MG PO TABS: 50.0000 mg | ORAL_TABLET | Freq: Four times a day (QID) | ORAL | 0 refills | Status: DC | PRN

## 2020-11-11 MED ORDER — GABAPENTIN 600 MG PO TABS: 600.0000 mg | ORAL_TABLET | Freq: Three times a day (TID) | ORAL | 1 refills | Status: DC

## 2020-11-11 NOTE — Addendum Note (Signed)
Addended by: Fabian November on: 11/11/2020 05:24 PM   Modules accepted: Orders

## 2020-12-13 ENCOUNTER — Ambulatory Visit: Payer: 59 | Admitting: Dermatology

## 2020-12-27 ENCOUNTER — Other Ambulatory Visit: Payer: Self-pay | Admitting: Obstetrics and Gynecology

## 2020-12-29 ENCOUNTER — Other Ambulatory Visit: Payer: Self-pay | Admitting: Physician Assistant

## 2021-01-12 ENCOUNTER — Other Ambulatory Visit: Payer: Self-pay | Admitting: Obstetrics and Gynecology

## 2021-01-19 ENCOUNTER — Other Ambulatory Visit: Payer: Self-pay | Admitting: Physician Assistant

## 2021-01-19 DIAGNOSIS — I10 Essential (primary) hypertension: Secondary | ICD-10-CM

## 2021-01-24 ENCOUNTER — Other Ambulatory Visit: Payer: Self-pay | Admitting: Obstetrics and Gynecology

## 2021-01-24 ENCOUNTER — Other Ambulatory Visit: Payer: Self-pay

## 2021-01-26 ENCOUNTER — Other Ambulatory Visit: Payer: Self-pay | Admitting: Obstetrics and Gynecology

## 2021-01-26 DIAGNOSIS — M6289 Other specified disorders of muscle: Secondary | ICD-10-CM

## 2021-01-26 DIAGNOSIS — Z90710 Acquired absence of both cervix and uterus: Secondary | ICD-10-CM

## 2021-01-26 DIAGNOSIS — G8929 Other chronic pain: Secondary | ICD-10-CM

## 2021-01-26 DIAGNOSIS — N941 Unspecified dyspareunia: Secondary | ICD-10-CM

## 2021-01-26 DIAGNOSIS — Z8742 Personal history of other diseases of the female genital tract: Secondary | ICD-10-CM

## 2021-02-03 ENCOUNTER — Other Ambulatory Visit: Payer: Self-pay | Admitting: Obstetrics and Gynecology

## 2021-02-03 ENCOUNTER — Encounter: Payer: Self-pay | Admitting: Physician Assistant

## 2021-02-03 ENCOUNTER — Other Ambulatory Visit: Payer: Self-pay

## 2021-02-03 DIAGNOSIS — Z20822 Contact with and (suspected) exposure to covid-19: Secondary | ICD-10-CM

## 2021-02-03 DIAGNOSIS — R6889 Other general symptoms and signs: Secondary | ICD-10-CM

## 2021-02-03 LAB — POC COVID19 BINAXNOW: SARS Coronavirus 2 Ag: NEGATIVE

## 2021-02-03 LAB — POCT INFLUENZA A/B
Influenza A, POC: NEGATIVE
Influenza B, POC: NEGATIVE

## 2021-02-03 NOTE — Progress Notes (Signed)
Presents to [facility] for on-site Outdoor collection for rapid covid test, PCR covid test & Influenza test.  Rapid Covid Test = Negative Rapid Influenza Test = Negative PCR Covid Test = Results Pending  Notified Khia that both rapid tests were negative & that we are sending the PCR Covid Test to [facility] for processing. Advised to tx S/Sx with OTC cold meds  AMD

## 2021-02-04 ENCOUNTER — Other Ambulatory Visit: Payer: Self-pay | Admitting: Physician Assistant

## 2021-02-04 LAB — SARS-COV-2, NAA 2 DAY TAT

## 2021-02-04 LAB — NOVEL CORONAVIRUS, NAA: SARS-CoV-2, NAA: NOT DETECTED

## 2021-02-04 MED ORDER — HYDROCOD POLST-CPM POLST ER 10-8 MG/5ML PO SUER: 5.0000 mL | Freq: Two times a day (BID) | ORAL | 0 refills | Status: DC

## 2021-02-10 ENCOUNTER — Other Ambulatory Visit: Payer: Self-pay | Admitting: Obstetrics and Gynecology

## 2021-02-22 ENCOUNTER — Other Ambulatory Visit: Payer: Self-pay | Admitting: Obstetrics and Gynecology

## 2021-02-27 ENCOUNTER — Encounter: Payer: Self-pay | Admitting: Physician Assistant

## 2021-02-27 ENCOUNTER — Other Ambulatory Visit: Payer: Self-pay | Admitting: Physician Assistant

## 2021-02-27 DIAGNOSIS — R051 Acute cough: Secondary | ICD-10-CM

## 2021-02-28 ENCOUNTER — Other Ambulatory Visit: Payer: Self-pay | Admitting: Physician Assistant

## 2021-02-28 MED ORDER — HYDROCOD POLST-CPM POLST ER 10-8 MG/5ML PO SUER: 5.0000 mL | Freq: Two times a day (BID) | ORAL | 0 refills | Status: DC

## 2021-02-28 NOTE — Patient Instructions (Incomplete)
Breast Self-Awareness °Breast self-awareness is knowing how your breasts look and feel. Doing breast self-awareness is important. It allows you to catch a breast problem early while it is still small and can be treated. All women should do breast self-awareness, including women who have had breast implants. Tell your doctor if you notice a change in your breasts. °What you need: °A mirror. °A well-lit room. °How to do a breast self-exam °A breast self-exam is one way to learn what is normal for your breasts and to check for changes. To do a breast self-exam: °Look for changes ° °Take off all the clothes above your waist. °Stand in front of a mirror in a room with good lighting. °Put your hands on your hips. °Push your hands down. °Look at your breasts and nipples in the mirror to see if one breast or nipple looks different from the other. Check to see if: °The shape of one breast is different. °The size of one breast is different. °There are wrinkles, dips, and bumps in one breast and not the other. °Look at each breast for changes in the skin, such as: °Redness. °Scaly areas. °Look for changes in your nipples, such as: °Liquid around the nipples. °Bleeding. °Dimpling. °Redness. °A change in where the nipples are. °Feel for changes ° °Lie on your back on the floor. °Feel each breast. To do this, follow these steps: °Pick a breast to feel. °Put the arm closest to that breast above your head. °Use your other arm to feel the nipple area of your breast. Feel the area with the pads of your three middle fingers by making small circles with your fingers. For the first circle, press lightly. For the second circle, press harder. For the third circle, press even harder. °Keep making circles with your fingers at the different pressures as you move down your breast. Stop when you feel your ribs. °Move your fingers a little toward the center of your body. °Start making circles with your fingers again, this time going up until  you reach your collarbone. °Keep making up-and-down circles until you reach your armpit. Remember to keep using the three pressures. °Feel the other breast in the same way. °Sit or stand in the tub or shower. °With soapy water on your skin, feel each breast the same way you did in step 2 when you were lying on the floor. °Write down what you find °Writing down what you find can help you remember what to tell your doctor. Write down: °What is normal for each breast. °Any changes you find in each breast, including: °The kind of changes you find. °Whether you have pain. °Size and location of any lumps. °When you last had your menstrual period. °General tips °Check your breasts every month. °If you are breastfeeding, the best time to check your breasts is after you feed your baby or after you use a breast pump. °If you get menstrual periods, the best time to check your breasts is 5-7 days after your menstrual period is over. °With time, you will become comfortable with the self-exam, and you will begin to know if there are changes in your breasts. °Contact a doctor if you: °See a change in the shape or size of your breasts or nipples. °See a change in the skin of your breast or nipples, such as red or scaly skin. °Have fluid coming from your nipples that is not normal. °Find a lump or thick area that was not there before. °Have pain in   your breasts. °Have any concerns about your breast health. °Summary °Breast self-awareness includes looking for changes in your breasts, as well as feeling for changes within your breasts. °Breast self-awareness should be done in front of a mirror in a well-lit room. °You should check your breasts every month. If you get menstrual periods, the best time to check your breasts is 5-7 days after your menstrual period is over. °Let your doctor know of any changes you see in your breasts, including changes in size, changes on the skin, pain or tenderness, or fluid from your nipples that is not  normal. °This information is not intended to replace advice given to you by your health care provider. Make sure you discuss any questions you have with your health care provider. °Document Revised: 11/20/2017 Document Reviewed: 11/20/2017 °Elsevier Patient Education © 2022 [employer] °Preventive Care 21-39 Years Old, Female °Preventive care refers to lifestyle choices and visits with your health care provider that can promote health and wellness. Preventive care visits are also called wellness exams. °What can I expect for my preventive care visit? °Counseling °During your preventive care visit, your health care provider may ask about your: °Medical history, including: °Past medical problems. °Family medical history. °Pregnancy history. °Current health, including: °Menstrual cycle. °Method of birth control. °Emotional well-being. °Home life and relationship well-being. °Sexual activity and sexual health. °Lifestyle, including: °Alcohol, nicotine or tobacco, and drug use. °Access to firearms. °Diet, exercise, and sleep habits. °Work and work environment. °Sunscreen use. °Safety issues such as seatbelt and bike helmet use. °Physical exam °Your health care provider may check your: °Height and weight. These may be used to calculate your BMI (body mass index). BMI is a measurement that tells if you are at a healthy weight. °Waist circumference. This measures the distance around your waistline. This measurement also tells if you are at a healthy weight and may help predict your risk of certain diseases, such as type 2 diabetes and high blood pressure. °Heart rate and blood pressure. °Body temperature. °Skin for abnormal spots. °What immunizations do I need? °Vaccines are usually given at various ages, according to a schedule. Your health care provider will recommend vaccines for you based on your age, medical history, and lifestyle or other factors, such as travel or where you work. °What tests do I  need? °Screening °Your health care provider may recommend screening tests for certain conditions. This may include: °Pelvic exam and Pap test. °Lipid and cholesterol levels. °Diabetes screening. This is done by checking your blood sugar (glucose) after you have not eaten for a while (fasting). °Hepatitis B test. °Hepatitis C test. °HIV (human immunodeficiency virus) test. °STI (sexually transmitted infection) testing, if you are at risk. °BRCA-related cancer screening. This may be done if you have a family history of breast, ovarian, tubal, or peritoneal cancers. °Talk with your health care provider about your test results, treatment options, and if necessary, the need for more tests. °Follow these instructions at home: °Eating and drinking ° °Eat a healthy diet that includes fresh fruits and vegetables, whole grains, lean protein, and low-fat dairy products. °Take vitamin and mineral supplements as recommended by your health care provider. °Do not drink alcohol if: °Your health care provider tells you not to drink. °You are pregnant, may be pregnant, or are planning to become pregnant. °If you drink alcohol: °Limit how much you have to 0-1 drink a day. °Know how much alcohol is in your drink. In the [place] one drink equals one 12 oz   bottle of beer (355 mL), one 5 oz glass of wine (148 mL), or one 1½ oz glass of hard liquor (44 mL). °Lifestyle °Brush your teeth every morning and night with fluoride toothpaste. Floss one time each day. °Exercise for at least 30 minutes 5 or more days each week. °Do not use any products that contain nicotine or tobacco. These products include cigarettes, chewing tobacco, and vaping devices, such as e-cigarettes. If you need help quitting, ask your health care provider. °Do not use drugs. °If you are sexually active, practice safe sex. Use a condom or other form of protection to prevent STIs. °If you do not wish to become pregnant, use a form of birth control. If you plan to become  pregnant, see your health care provider for a prepregnancy visit. °Find healthy ways to manage stress, such as: °Meditation, yoga, or listening to music. °Journaling. °Talking to a trusted person. °Spending time with friends and family. °Minimize exposure to UV radiation to reduce your risk of skin cancer. °Safety °Always wear your seat belt while driving or riding in a vehicle. °Do not drive: °If you have been drinking alcohol. Do not ride with someone who has been drinking. °If you have been using any mind-altering substances or drugs. °While texting. °When you are tired or distracted. °Wear a helmet and other protective equipment during sports activities. °If you have firearms in your house, make sure you follow all gun safety procedures. °Seek help if you have been physically or sexually abused. °What's next? °Go to your health care provider once a year for an annual wellness visit. °Ask your health care provider how often you should have your eyes and teeth checked. °Stay up to date on all vaccines. °This information is not intended to replace advice given to you by your health care provider. Make sure you discuss any questions you have with your health care provider. °Document Revised: 09/29/2020 Document Reviewed: 09/29/2020 °Elsevier Patient Education © 2022 [employer] ° °

## 2021-03-01 ENCOUNTER — Encounter: Payer: 59 | Admitting: Obstetrics and Gynecology

## 2021-03-08 ENCOUNTER — Other Ambulatory Visit: Payer: Self-pay | Admitting: Obstetrics and Gynecology

## 2021-03-14 MED ORDER — TRAMADOL HCL 50 MG PO TABS: 100.0000 mg | ORAL_TABLET | Freq: Four times a day (QID) | ORAL | 0 refills | Status: DC | PRN

## 2021-03-24 ENCOUNTER — Other Ambulatory Visit: Payer: Self-pay | Admitting: Obstetrics and Gynecology

## 2021-03-28 ENCOUNTER — Encounter: Payer: Self-pay | Admitting: Obstetrics and Gynecology

## 2021-03-29 ENCOUNTER — Other Ambulatory Visit: Payer: Self-pay | Admitting: Obstetrics and Gynecology

## 2021-03-29 MED ORDER — GABAPENTIN 600 MG PO TABS: 600.0000 mg | ORAL_TABLET | Freq: Three times a day (TID) | ORAL | 3 refills | Status: DC

## 2021-04-05 DIAGNOSIS — R059 Cough, unspecified: Secondary | ICD-10-CM | POA: Diagnosis not present

## 2021-04-05 DIAGNOSIS — B349 Viral infection, unspecified: Secondary | ICD-10-CM | POA: Diagnosis not present

## 2021-04-05 DIAGNOSIS — J029 Acute pharyngitis, unspecified: Secondary | ICD-10-CM | POA: Diagnosis not present

## 2021-04-05 DIAGNOSIS — E669 Obesity, unspecified: Secondary | ICD-10-CM | POA: Diagnosis not present

## 2021-04-06 DIAGNOSIS — E119 Type 2 diabetes mellitus without complications: Secondary | ICD-10-CM | POA: Diagnosis not present

## 2021-04-06 DIAGNOSIS — I10 Essential (primary) hypertension: Secondary | ICD-10-CM | POA: Diagnosis not present

## 2021-04-08 ENCOUNTER — Encounter: Payer: Self-pay | Admitting: Obstetrics and Gynecology

## 2021-04-08 ENCOUNTER — Other Ambulatory Visit: Payer: Self-pay | Admitting: Obstetrics and Gynecology

## 2021-04-09 MED ORDER — TRAMADOL HCL 50 MG PO TABS: 100.0000 mg | ORAL_TABLET | Freq: Four times a day (QID) | ORAL | 0 refills | Status: DC | PRN

## 2021-04-19 DIAGNOSIS — L03213 Periorbital cellulitis: Secondary | ICD-10-CM | POA: Diagnosis not present

## 2021-05-06 ENCOUNTER — Other Ambulatory Visit: Payer: Self-pay | Admitting: Obstetrics and Gynecology

## 2021-05-09 ENCOUNTER — Other Ambulatory Visit: Payer: Self-pay | Admitting: Obstetrics and Gynecology

## 2021-05-09 ENCOUNTER — Other Ambulatory Visit: Payer: Self-pay

## 2021-05-09 DIAGNOSIS — Z1152 Encounter for screening for COVID-19: Secondary | ICD-10-CM

## 2021-05-09 LAB — POC COVID19 BINAXNOW: SARS Coronavirus 2 Ag: NEGATIVE

## 2021-05-09 MED ORDER — TRAMADOL HCL 50 MG PO TABS: 100.0000 mg | ORAL_TABLET | Freq: Four times a day (QID) | ORAL | 0 refills | Status: DC | PRN

## 2021-05-09 NOTE — Progress Notes (Signed)
Pt presents today with congestion, ear pain, and drainage for 3 days no fever.[name]

## 2021-05-10 LAB — NOVEL CORONAVIRUS, NAA: SARS-CoV-2, NAA: NOT DETECTED

## 2021-05-10 LAB — SARS-COV-2, NAA 2 DAY TAT

## 2021-05-12 NOTE — Progress Notes (Deleted)
GYNECOLOGY ANNUAL PHYSICAL EXAM PROGRESS NOTE  Subjective:    [name] is a 41 y.o. G13P1102 female who presents for an annual exam. The patient has no complaints today. The patient {is/is not/has never been:13135} sexually active. The patient participates in regular exercise: {yes/no/not asked:9010}. Has the patient ever been transfused or tattooed?: {yes/no/not asked:9010}. The patient reports that there {is/is not:9024} domestic violence in her life.    Menstrual History: Menarche age: *** Patient's last menstrual period was 10/15/2013.     Gynecologic History:  Contraception: {method:5051} History of STI's:  Last Pap: ***. Results were: {norm/abn:16337}.  ***Denies/Notes h/o abnormal pap smears. Last mammogram: ***. Results were: {norm/abn:16337}       OB History  Gravida Para Term Preterm AB Living  2 2 1 1  0 2  SAB IAB Ectopic Multiple Live Births  0 0 0 0 2    # Outcome Date GA Lbr Len/2nd Weight Sex Delivery Anes PTL Lv  2 Preterm 12/02/12 [redacted]w[redacted]d  6 lb 6.8 oz (2.915 kg) F CS-LTranv Spinal  LIV     Name: Keating,GIRL Joni     Apgar1: 8  Apgar5: 9  1 Term 2008 [redacted]w[redacted]d  6 lb 14 oz (3.118 kg) F CS-LTranv Spinal N LIV     Birth Comments:  pre-e on MAG, IOL. failure to progress    Past Medical History:  Diagnosis Date   Abnormal Pap smear    Anxiety    Endometriosis    Family history of hemochromatosis    mother    HA (headache)    otc med prn   Hyperlipidemia    diet controlled, no meds   Hypertension    PONV (postoperative nausea and vomiting)    Pregnancy induced hypertension 2008    Past Surgical History:  Procedure Laterality Date   ABDOMINAL HYSTERECTOMY     ABLATION ON ENDOMETRIOSIS N/A 06/04/2014   Procedure: ABLATION ON ENDOMETRIOSIS;  Surgeon: 06/06/2014, MD;  Location: [facility];  Service: Gynecology;  Laterality: N/A;   BIOPSY  12/01/2011   Procedure: BIOPSY;  Surgeon: 12/03/2011, MD;  Location: [facility];  Service:  Gynecology;  Laterality: N/A;   CESAREAN SECTION  2008   CESAREAN SECTION WITH BILATERAL TUBAL LIGATION N/A 12/02/2012   Procedure: Repeat Cesarean Section Delivery Baby Girl @ 1826, Apgars 8/9, Bilateral Tubal Ligation;  Surgeon: 10/9, MD;  Location: [facility];  Service: Obstetrics;  Laterality: N/A;   COLPOSCOPY     as teenager, results WNL   ENDOMETRIAL BIOPSY     LAPAROSCOPIC ASSISTED VAGINAL HYSTERECTOMY  2012   with bilateral salpingectomy   LAPAROSCOPIC BILATERAL SALPINGECTOMY Bilateral 02/15/2015   Procedure: LAPAROSCOPIC BILATERAL SALPINGECTOMY;  Surgeon: 02/17/2015, MD;  Location: [facility];  Service: Gynecology;  Laterality: Bilateral;   LAPAROSCOPIC LYSIS OF ADHESIONS N/A 06/04/2014   Procedure: LAPAROSCOPIC LYSIS OF ADHESIONS;  Surgeon: 06/06/2014, MD;  Location: [facility];  Service: Gynecology;  Laterality: N/A;   LAPAROSCOPIC SALPINGO OOPHERECTOMY Right 07/17/2016   Procedure: LAPAROSCOPIC RIGHT SALPINGO OOPHORECTOMY;  Surgeon: 09/16/2016, MD;  Location: [facility];  Service: Gynecology;  Laterality: Right;   LAPAROSCOPY  12/01/2011   Procedure: LAPAROSCOPY OPERATIVE;  Surgeon: 12/03/2011, MD;  Location: [facility];  Service: Gynecology;  Laterality: N/A;   LAPAROSCOPY N/A 06/04/2014   Procedure: LAPAROSCOPY OPERATIVE;  Surgeon: 06/06/2014, MD;  Location: [facility];  Service: Gynecology;  Laterality: N/A;  LAPAROSCOPY N/A 02/14/2016   Procedure: LAPAROSCOPY DIAGNOSTIC WITH BIOPSIES POSSIBLE ADHESIOLYSIS;  Surgeon: Hildred LaserAnika Cherry, MD;  Location: [facility];  Service: Gynecology;  Laterality: N/A;   LAPAROSCOPY N/A 05/26/2016   Procedure: LAPAROSCOPY operative with lysis of adhesions;  Surgeon: Linzie Collinavid James Evans, MD;  Location: [facility];  Service: Gynecology;  Laterality: N/A;   OOPHORECTOMY Left    SALPINGECTOMY Bilateral    TONSILLECTOMY     TUBAL LIGATION     TYMPANOSTOMY TUBE PLACEMENT      Family History  Problem Relation Age of Onset   Hypertension  Father    Coronary artery disease Paternal Uncle    Kidney disease Paternal Grandfather    Cancer Paternal Grandfather    Hypertension Mother    Hemochromatosis Mother    Breast cancer Paternal Aunt    Ovarian cancer Maternal Grandmother    Lung cancer Maternal Grandfather    Brain cancer Maternal Grandfather    CVA Paternal Grandmother    Heart attack Paternal Grandmother    Diabetes Neg Hx     Social History   Socioeconomic History   Marital status: Married    Spouse name: Not on file   Number of children: 1   Years of education: Not on file   Highest education level: Not on file  Occupational History   Occupation: Civil engineer, contractingolice officer    Employer: [employer]    Comment: only once in a while   Occupation: Engineer, drillingDispatcher    Employer: [employer]    Comment: full time  Tobacco Use   Smoking status: Never   Smokeless tobacco: Never  Vaping Use   Vaping Use: Never used  Substance and Sexual Activity   Alcohol use: Yes    Comment: occasional   Drug use: No   Sexual activity: Yes    Birth control/protection: Surgical  Other Topics Concern   Not on file  Social History Narrative   Not on file   Social Determinants of Health   Financial Resource Strain: Not on file  Food Insecurity: Not on file  Transportation Needs: Not on file  Physical Activity: Not on file  Stress: Not on file  Social Connections: Not on file  Intimate Partner Violence: Not on file    Current Outpatient Medications on File Prior to Visit  Medication Sig Dispense Refill   amLODipine (NORVASC) 10 MG tablet Take 1 tablet (10 mg total) by mouth daily. 90 tablet 3   chlorpheniramine-HYDROcodone (TUSSIONEX PENNKINETIC ER) 10-8 MG/5ML SUER Take 5 mLs by mouth 2 (two) times daily. 115 mL 0   DULoxetine (CYMBALTA) 20 MG capsule Take 1 capsule (20 mg total) by mouth daily. 30 capsule 11   estradiol (ESTRACE) 1 MG tablet Take 1 mg by mouth daily.     gabapentin (NEURONTIN) 600 MG tablet Take 1  tablet (600 mg total) by mouth 3 (three) times daily. 90 tablet 3   gemfibrozil (LOPID) 600 MG tablet Take 1 tablet by mouth in the morning and at bedtime.     losartan-hydrochlorothiazide (HYZAAR) 100-25 MG tablet Take 1 tablet by mouth daily. 90 tablet 3   metoprolol succinate (TOPROL-XL) 50 MG 24 hr tablet Take 1 tablet (50 mg total) by mouth daily. Take with or immediately following a meal. 90 tablet 3   traMADol (ULTRAM) 50 MG tablet Take 2 tablets (100 mg total) by mouth every 6 (six) hours as needed for moderate pain. 56 tablet 0   triamcinolone lotion (KENALOG) 0.1 % Apply 1  application topically 3 (three) times daily. 60 mL 0   zolpidem (AMBIEN) 5 MG tablet Take 1 tablet (5 mg total) by mouth at bedtime as needed for sleep. 30 tablet 1   No current facility-administered medications on file prior to visit.    Allergies  Allergen Reactions   Sulfamethoxazole-Trimethoprim Swelling and Rash   Amoxil [Amoxicillin] Rash     Has tolerated augmentin!!  ? If had viral resh with amox.   Azithromycin Rash   Cefprozil Rash   Ceftin [Cefuroxime Axetil] Rash   Other Itching    CHG-wipes she had to use prier to surgery.  Ok to use CHG wash without problems CHG-wipes she had to use prier to surgery.  Ok to use CHG wash without problems CHG-wipes she had to use prier to surgery.  Ok to use CHG wash without problems CHG-wipes she had to use prier to surgery.  Ok to use CHG wash without problems CHG-wipes she had to use prier to surgery.  Ok to use CHG wash without problems   Penicillins Rash    Has patient had a PCN reaction causing immediate rash, facial/tongue/throat swelling, SOB or lightheadedness with hypotension: Yes Has patient had a PCN reaction causing severe rash involving mucus membranes or skin necrosis: No Has patient had a PCN reaction that required hospitalization No Has patient had a PCN reaction occurring within the last 10 years: No If all of the above answers are "NO", then  may proceed with Cephalosporin use.  Has patient had a PCN reaction causing immediate rash, facial/tongue/throat swelling, SOB or lightheadedness with hypotension: Yes Has patient had a PCN reaction causing severe rash involving mucus membranes or skin necrosis: No Has patient had a PCN reaction that required hospitalization No Has patient had a PCN reaction occurring within the last 10 years: No If all of the above answers are "NO", then may proceed with Cephalosporin use.  Has tolerated augmentin!!  ? If had viral resh with amox.   Sulfa Antibiotics Rash     Review of Systems Constitutional: negative for chills, fatigue, fevers and sweats Eyes: negative for irritation, redness and visual disturbance Ears, nose, mouth, throat, and face: negative for hearing loss, nasal congestion, snoring and tinnitus Respiratory: negative for asthma, cough, sputum Cardiovascular: negative for chest pain, dyspnea, exertional chest pressure/discomfort, irregular heart beat, palpitations and syncope Gastrointestinal: negative for abdominal pain, change in bowel habits, nausea and vomiting Genitourinary: negative for abnormal menstrual periods, genital lesions, sexual problems and vaginal discharge, dysuria and urinary incontinence Integument/breast: negative for breast lump, breast tenderness and nipple discharge Hematologic/lymphatic: negative for bleeding and easy bruising Musculoskeletal:negative for back pain and muscle weakness Neurological: negative for dizziness, headaches, vertigo and weakness Endocrine: negative for diabetic symptoms including polydipsia, polyuria and skin dryness Allergic/Immunologic: negative for hay fever and urticaria      Objective:  Last menstrual period 10/15/2013. There is no height or weight on file to calculate BMI.    General Appearance:    Alert, cooperative, no distress, appears stated age  Head:    Normocephalic, without obvious abnormality, atraumatic  Eyes:     PERRL, conjunctiva/corneas clear, EOM's intact, both eyes  Ears:    Normal external ear canals, both ears  Nose:   Nares normal, septum midline, mucosa normal, no drainage or sinus tenderness  Throat:   Lips, mucosa, and tongue normal; teeth and gums normal  Neck:   Supple, symmetrical, trachea midline, no adenopathy; thyroid: no enlargement/tenderness/nodules; no carotid bruit or JVD  Back:     Symmetric, no curvature, ROM normal, no CVA tenderness  Lungs:     Clear to auscultation bilaterally, respirations unlabored  Chest Wall:    No tenderness or deformity   Heart:    Regular rate and rhythm, S1 and S2 normal, no murmur, rub or gallop  Breast Exam:    No tenderness, masses, or nipple abnormality  Abdomen:     Soft, non-tender, bowel sounds active all four quadrants, no masses, no organomegaly.    Genitalia:    Pelvic:external genitalia normal, vagina without lesions, discharge, or tenderness, rectovaginal septum  normal. Cervix normal in appearance, no cervical motion tenderness, no adnexal masses or tenderness.  Uterus normal size, shape, mobile, regular contours, nontender.  Rectal:    Normal external sphincter.  No hemorrhoids appreciated. Internal exam not done.   Extremities:   Extremities normal, atraumatic, no cyanosis or edema  Pulses:   2+ and symmetric all extremities  Skin:   Skin color, texture, turgor normal, no rashes or lesions  Lymph nodes:   Cervical, supraclavicular, and axillary nodes normal  Neurologic:   CNII-XII intact, normal strength, sensation and reflexes throughout   .  Labs:  Lab Results  Component Value Date   WBC 5.3 11/29/2018   HGB 13.7 11/29/2018   HCT 40.3 11/29/2018   MCV 88 11/29/2018   PLT 336 11/29/2018    Lab Results  Component Value Date   CREATININE 0.77 11/29/2018   BUN 15 11/29/2018   NA 139 11/29/2018   K 4.5 11/29/2018   CL 100 11/29/2018   CO2 29 03/03/2018    Lab Results  Component Value Date   ALT 26 11/29/2018   AST 22  11/29/2018   GGT 53 11/29/2018   ALKPHOS 60 11/29/2018   BILITOT 0.3 11/29/2018    Lab Results  Component Value Date   TSH 6.040 (H) 11/29/2018     Assessment:   No diagnosis found.   Plan:  Blood tests: {blood tests:13147}. Breast self exam technique reviewed and patient encouraged to perform self-exam monthly. Contraception: {contraceptive methods:5051}. Discussed healthy lifestyle modifications. Mammogram {discussed/ordered:14545} Pap smear {discussed/ordered:14545}. COVID vaccination status: Follow up in 1 year for annual exam   Hildred Laser, MD [facility]

## 2021-05-13 ENCOUNTER — Encounter: Payer: 59 | Admitting: Obstetrics and Gynecology

## 2021-05-16 ENCOUNTER — Other Ambulatory Visit: Payer: Self-pay

## 2021-05-16 DIAGNOSIS — Z1152 Encounter for screening for COVID-19: Secondary | ICD-10-CM

## 2021-05-16 LAB — POCT INFLUENZA A/B
Influenza A, POC: NEGATIVE
Influenza B, POC: NEGATIVE

## 2021-05-16 LAB — POC COVID19 BINAXNOW: SARS Coronavirus 2 Ag: NEGATIVE

## 2021-05-16 NOTE — Progress Notes (Signed)
Daughter test positive 05/11/21,   Congestion and runny nose started 05/09/21.  patient started feeling nauseated 05/13/21 an had fever last night 05/15/21.

## 2021-05-17 LAB — SARS-COV-2, NAA 2 DAY TAT

## 2021-05-17 LAB — NOVEL CORONAVIRUS, NAA: SARS-CoV-2, NAA: NOT DETECTED

## 2021-05-24 DIAGNOSIS — E119 Type 2 diabetes mellitus without complications: Secondary | ICD-10-CM | POA: Diagnosis not present

## 2021-05-24 DIAGNOSIS — J3489 Other specified disorders of nose and nasal sinuses: Secondary | ICD-10-CM | POA: Diagnosis not present

## 2021-05-24 DIAGNOSIS — R110 Nausea: Secondary | ICD-10-CM | POA: Diagnosis not present

## 2021-06-05 ENCOUNTER — Other Ambulatory Visit: Payer: Self-pay | Admitting: Obstetrics and Gynecology

## 2021-06-06 ENCOUNTER — Encounter: Payer: Self-pay | Admitting: Obstetrics and Gynecology

## 2021-06-08 MED ORDER — TRAMADOL HCL 50 MG PO TABS: 100.0000 mg | ORAL_TABLET | Freq: Four times a day (QID) | ORAL | 0 refills | Status: DC | PRN

## 2021-06-21 DIAGNOSIS — J342 Deviated nasal septum: Secondary | ICD-10-CM | POA: Diagnosis not present

## 2021-06-21 DIAGNOSIS — J3489 Other specified disorders of nose and nasal sinuses: Secondary | ICD-10-CM | POA: Diagnosis not present

## 2021-06-21 DIAGNOSIS — R0981 Nasal congestion: Secondary | ICD-10-CM | POA: Diagnosis not present

## 2021-06-21 DIAGNOSIS — J343 Hypertrophy of nasal turbinates: Secondary | ICD-10-CM | POA: Diagnosis not present

## 2021-06-25 ENCOUNTER — Other Ambulatory Visit: Payer: Self-pay | Admitting: Obstetrics and Gynecology

## 2021-06-27 DIAGNOSIS — L03213 Periorbital cellulitis: Secondary | ICD-10-CM | POA: Diagnosis not present

## 2021-06-28 DIAGNOSIS — J069 Acute upper respiratory infection, unspecified: Secondary | ICD-10-CM | POA: Diagnosis not present

## 2021-07-04 ENCOUNTER — Other Ambulatory Visit: Payer: Self-pay | Admitting: Obstetrics and Gynecology

## 2021-07-04 ENCOUNTER — Encounter: Payer: Self-pay | Admitting: Obstetrics and Gynecology

## 2021-07-05 MED ORDER — TRAMADOL HCL 50 MG PO TABS: 100.0000 mg | ORAL_TABLET | Freq: Four times a day (QID) | ORAL | 0 refills | Status: DC | PRN

## 2021-07-15 ENCOUNTER — Encounter: Payer: 59 | Admitting: Obstetrics and Gynecology

## 2021-07-19 DIAGNOSIS — F4310 Post-traumatic stress disorder, unspecified: Secondary | ICD-10-CM | POA: Diagnosis not present

## 2021-07-21 ENCOUNTER — Other Ambulatory Visit: Payer: Self-pay | Admitting: Physician Assistant

## 2021-07-21 ENCOUNTER — Ambulatory Visit: Payer: Self-pay | Admitting: Physician Assistant

## 2021-07-21 ENCOUNTER — Encounter: Payer: Self-pay | Admitting: Physician Assistant

## 2021-07-21 VITALS — BP 123/94 | HR 117 | Temp 98.6°F | Resp 14 | Ht 65.0 in | Wt 195.0 lb

## 2021-07-21 DIAGNOSIS — Z1152 Encounter for screening for COVID-19: Secondary | ICD-10-CM

## 2021-07-21 DIAGNOSIS — J0100 Acute maxillary sinusitis, unspecified: Secondary | ICD-10-CM

## 2021-07-21 LAB — POC COVID19 BINAXNOW: SARS Coronavirus 2 Ag: NEGATIVE

## 2021-07-21 MED ORDER — FEXOFENADINE-PSEUDOEPHED ER 60-120 MG PO TB12
1.0000 | ORAL_TABLET | Freq: Two times a day (BID) | ORAL | 0 refills | Status: DC
Start: 2021-07-21 — End: 2021-08-03

## 2021-07-21 MED ORDER — DOXYCYCLINE MONOHYDRATE 100 MG PO CAPS: 100.0000 mg | ORAL_CAPSULE | Freq: Two times a day (BID) | ORAL | 0 refills | Status: DC

## 2021-07-21 MED ORDER — BENZONATATE 200 MG PO CAPS: 200.0000 mg | ORAL_CAPSULE | Freq: Two times a day (BID) | ORAL | 0 refills | Status: DC | PRN

## 2021-07-21 MED ORDER — FLUCONAZOLE 150 MG PO TABS: 150.0000 mg | ORAL_TABLET | Freq: Once | ORAL | 0 refills | Status: AC

## 2021-07-21 MED ORDER — HYDROCOD POLI-CHLORPHE POLI ER 10-8 MG/5ML PO SUER: 5.0000 mL | Freq: Every evening | ORAL | 0 refills | Status: DC | PRN

## 2021-07-21 NOTE — Progress Notes (Signed)
Pt presents today with sinus issues. Pt tested with Rapid covid: Negative. ? ?Cough, congestion and right side of nose is draining bad and a lot of pressure around cheek bones and head for about 3 days. ?

## 2021-07-21 NOTE — Progress Notes (Signed)
? ?  Subjective: Sinus congestion with cough  ? ? Patient ID: [name], female    DOB: [birth year], 41 y.o.   MRN: [id number] ? ?HPI ?Patient complain of 1 week of sinus congestion and cough.  Patient states cough is worse at night when laying down.  Patient states frontal headache but denies vision disturbance, vertigo, or weakness.  Patient tested negative for COVID-19 prior to arrival.  Denies recent travel or known contact with COVID-19.  Patient is allergic to amoxicillin, Zithromax, and sulfa antibiotics. ? ?Review of Systems ?Past medical history for anxiety, allergic rhinitis, hyperlipidemia, hyperlipidemia, and subacute hypothyroidism. ?   ?Objective:  ? Physical Exam ? ?Temperature is 98.6, respiration 14, pulse is 117, BP is 123/94, patient 90% O2 sat on room air. ?HEENT is remarkable for frontal and maxillary guarding with palpation.  Edematous nasal turbinates with postnasal drainage.  Neck is supple followed lymphadenectomy or bruits.  Lungs are clear to auscultation.  Nonproductive cough.  Heart is regular rate and rhythm. ? ? ?   ?Assessment & Plan: Subacute maxillary sinusitis  ? ?Patient given prescription for doxycycline, Tessalon Perles, Allegra-D, and Tussionex.  Advised take medication as directed and follow-up in 3 to 4 days if no improvement or worsening complaint. ?

## 2021-08-03 ENCOUNTER — Encounter: Payer: Self-pay | Admitting: Physician Assistant

## 2021-08-03 ENCOUNTER — Other Ambulatory Visit: Payer: Self-pay | Admitting: Obstetrics and Gynecology

## 2021-08-03 ENCOUNTER — Ambulatory Visit: Payer: Self-pay | Admitting: Physician Assistant

## 2021-08-03 DIAGNOSIS — R350 Frequency of micturition: Secondary | ICD-10-CM

## 2021-08-03 DIAGNOSIS — N390 Urinary tract infection, site not specified: Secondary | ICD-10-CM

## 2021-08-03 LAB — POCT URINALYSIS DIPSTICK
Bilirubin, UA: NEGATIVE
Blood, UA: POSITIVE
Glucose, UA: POSITIVE — AB
Ketones, UA: POSITIVE
Leukocytes, UA: NEGATIVE
Nitrite, UA: POSITIVE
Protein, UA: NEGATIVE
Spec Grav, UA: 1.015 (ref 1.010–1.025)
Urobilinogen, UA: 0.2 E.U./dL
pH, UA: 5.5 (ref 5.0–8.0)

## 2021-08-03 MED ORDER — FLUCONAZOLE 150 MG PO TABS: 150.0000 mg | ORAL_TABLET | Freq: Once | ORAL | 0 refills | Status: AC

## 2021-08-03 MED ORDER — NITROFURANTOIN MONOHYD MACRO 100 MG PO CAPS: 100.0000 mg | ORAL_CAPSULE | Freq: Two times a day (BID) | ORAL | 0 refills | Status: DC

## 2021-08-03 MED ORDER — PHENAZOPYRIDINE HCL 200 MG PO TABS: 200.0000 mg | ORAL_TABLET | Freq: Three times a day (TID) | ORAL | 0 refills | Status: DC | PRN

## 2021-08-03 NOTE — Progress Notes (Signed)
07-31-21 painful urination at end of stream and pressure, with frequent urination.  ?

## 2021-08-03 NOTE — Progress Notes (Signed)
? ?  Subjective: UTI  ? ? Patient ID: [name], female    DOB: 06-Oct-1980, 41 y.o.   MRN: [id number] ? ?HPI ?Patient complains of 3 days of urinary frequency, urgency, and dysuria.  Patient denies fever, flank pain, vaginal discharge. ? ? ?Review of Systems ?Anxiety, hyperlipidemia, hypertension, hypothyroidism, and insomnia. ?   ?Objective:  ? Physical Exam ?See nurses note for vital signs. ?Urinalysis positive for urinary tract infection.  Culture is pending. ? ? ? ?   ?Assessment & Plan: Urinary tract infection  ?Patient given prescription for Macrobid, Pyridium, and Diflucan.  Advised to follow-up for repeat urinalysis in 10 days. ? ?

## 2021-08-04 MED ORDER — TRAMADOL HCL 50 MG PO TABS: 100.0000 mg | ORAL_TABLET | Freq: Four times a day (QID) | ORAL | 0 refills | Status: DC | PRN

## 2021-08-06 LAB — URINE CULTURE

## 2021-08-09 DIAGNOSIS — H5213 Myopia, bilateral: Secondary | ICD-10-CM | POA: Diagnosis not present

## 2021-08-09 NOTE — Progress Notes (Signed)
? ? ?GYNECOLOGY ANNUAL PHYSICAL EXAM PROGRESS NOTE ? ?Subjective:  ? ? [name] is a 41 y.o. G13P1102 female who presents for an annual exam. The patient is sexually active. The patient participates in regular exercise: no. Has the patient ever been transfused or tattooed?: yes. The patient reports that there is not domestic violence in her life.  ? ?The patient has no complaints today.  ?Reports that she was recently diagnosed with diabetes approximately 4 to 5 months ago.  Has been started on metformin and Ozempic for management.  Overall has lost approximately 35 pounds since December. ?Reports that she was diagnosed with a UTI approximately 1 week ago.  Currently taking Macrobid for therapy.  Is noting some mild vaginal urethral irritation.  Of note, patient also mentions that she has been on several rounds of antibiotics in the past few months also dealing with sinusitis. ? ?Menstrual History: ?Menarche age: 89 ?Patient's last menstrual period was 10/15/2013. ?  ? ? ?Gynecologic History:  ?Contraception: status post hysterectomy ?History of STI's:  ?Last Pap: Hysterectomy ? ?OB History  ?Gravida Para Term Preterm AB Living  ?2 2 1 1  0 2  ?SAB IAB Ectopic Multiple Live Births  ?0 0 0 0 2  ?  ?# Outcome Date GA Lbr Len/2nd Weight Sex Delivery Anes PTL Lv  ?2 Preterm 12/02/12 [redacted]w[redacted]d  6 lb 6.8 oz (2.915 kg) F CS-LTranv Spinal  LIV  ?   Name: KMYA, PLACIDE  ?   Apgar1: 8  Apgar5: 9  ?1 Term 2008 [redacted]w[redacted]d  6 lb 14 oz (3.118 kg) F CS-LTranv Spinal N LIV  ?   Birth Comments:  pre-e on MAG, IOL. failure to progress  ? ? ?Past Medical History:  ?Diagnosis Date  ? Abnormal Pap smear   ? Anxiety   ? Endometriosis   ? Family history of hemochromatosis   ? mother   ? HA (headache)   ? otc med prn  ? Hyperlipidemia   ? diet controlled, no meds  ? Hypertension   ? PONV (postoperative nausea and vomiting)   ? Pregnancy induced hypertension 2008  ? ? ?Past Surgical History:  ?Procedure Laterality Date  ? ABDOMINAL  HYSTERECTOMY    ? ABLATION ON ENDOMETRIOSIS N/A 06/04/2014  ? Procedure: ABLATION ON ENDOMETRIOSIS;  Surgeon: 06/06/2014, MD;  Location: [facility];  Service: Gynecology;  Laterality: N/A;  ? BIOPSY  12/01/2011  ? Procedure: BIOPSY;  Surgeon: 12/03/2011, MD;  Location: [facility];  Service: Gynecology;  Laterality: N/A;  ? CESAREAN SECTION  2008  ? CESAREAN SECTION WITH BILATERAL TUBAL LIGATION N/A 12/02/2012  ? Procedure: Repeat Cesarean Section Delivery Baby Girl @ 1826, Apgars 8/9, Bilateral Tubal Ligation;  Surgeon: 10/9, MD;  Location: [facility];  Service: Obstetrics;  Laterality: N/A;  ? COLPOSCOPY    ? as teenager, results WNL  ? ENDOMETRIAL BIOPSY    ? LAPAROSCOPIC ASSISTED VAGINAL HYSTERECTOMY  2012  ? with bilateral salpingectomy  ? LAPAROSCOPIC BILATERAL SALPINGECTOMY Bilateral 02/15/2015  ? Procedure: LAPAROSCOPIC BILATERAL SALPINGECTOMY;  Surgeon: 02/17/2015, MD;  Location: [facility];  Service: Gynecology;  Laterality: Bilateral;  ? LAPAROSCOPIC LYSIS OF ADHESIONS N/A 06/04/2014  ? Procedure: LAPAROSCOPIC LYSIS OF ADHESIONS;  Surgeon: 06/06/2014, MD;  Location: [facility];  Service: Gynecology;  Laterality: N/A;  ? LAPAROSCOPIC SALPINGO OOPHERECTOMY Right 07/17/2016  ? Procedure: LAPAROSCOPIC RIGHT SALPINGO OOPHORECTOMY;  Surgeon: 09/16/2016, MD;  Location: [facility];  Service: Gynecology;  Laterality: Right;  ? LAPAROSCOPY  12/01/2011  ? Procedure: LAPAROSCOPY OPERATIVE;  Surgeon: Leslie AndreaJames E Tomblin II, MD;  Location: [facility];  Service: Gynecology;  Laterality: N/A;  ? LAPAROSCOPY N/A 06/04/2014  ? Procedure: LAPAROSCOPY OPERATIVE;  Surgeon: Leslie AndreaJames E Tomblin II, MD;  Location: [facility];  Service: Gynecology;  Laterality: N/A;  ? LAPAROSCOPY N/A 02/14/2016  ? Procedure: LAPAROSCOPY DIAGNOSTIC WITH BIOPSIES POSSIBLE ADHESIOLYSIS;  Surgeon: Hildred LaserAnika Braun Rocca, MD;  Location: [facility];  Service: Gynecology;  Laterality: N/A;  ? LAPAROSCOPY N/A 05/26/2016  ? Procedure: LAPAROSCOPY operative with lysis of  adhesions;  Surgeon: Linzie Collinavid James Evans, MD;  Location: [facility];  Service: Gynecology;  Laterality: N/A;  ? OOPHORECTOMY Left   ? SALPINGECTOMY Bilateral   ? TONSILLECTOMY    ? TUBAL LIGATION    ? TYMPANOSTOMY TUBE PLACEMENT    ? ? ?Family History  ?Problem Relation Age of Onset  ? Hypertension Father   ? Coronary artery disease Paternal Uncle   ? Kidney disease Paternal Grandfather   ? Cancer Paternal Grandfather   ? Hypertension Mother   ? Hemochromatosis Mother   ? Breast cancer Paternal Aunt   ? Ovarian cancer Maternal Grandmother   ? Lung cancer Maternal Grandfather   ? Brain cancer Maternal Grandfather   ? CVA Paternal Grandmother   ? Heart attack Paternal Grandmother   ? Diabetes Neg Hx   ? ? ?Social History  ? ?Socioeconomic History  ? Marital status: Married  ?  Spouse name: Not on file  ? Number of children: 1  ? Years of education: Not on file  ? Highest education level: Not on file  ?Occupational History  ? Occupation: Emergency planning/management officerolice officer  ?  Employer: [employer]  ?  Comment: only once in a while  ? Occupation: Science writerDispatcher  ?  Employer: [employer]  ?  Comment: full time  ?Tobacco Use  ? Smoking status: Never  ? Smokeless tobacco: Never  ?Vaping Use  ? Vaping Use: Never used  ?Substance and Sexual Activity  ? Alcohol use: Yes  ?  Comment: occasional  ? Drug use: No  ? Sexual activity: Yes  ?  Birth control/protection: Surgical  ?Other Topics Concern  ? Not on file  ?Social History Narrative  ? Not on file  ? ?Social Determinants of Health  ? ?Financial Resource Strain: Not on file  ?Food Insecurity: Not on file  ?Transportation Needs: Not on file  ?Physical Activity: Not on file  ?Stress: Not on file  ?Social Connections: Not on file  ?Intimate Partner Violence: Not on file  ? ? ?Current Outpatient Medications on File Prior to Visit  ?Medication Sig Dispense Refill  ? amLODipine (NORVASC) 10 MG tablet Take 1 tablet by mouth daily.    ? atorvastatin (LIPITOR) 20 MG tablet Take 20 mg by mouth  daily.    ? estradiol (ESTRACE) 1 MG tablet Take 1 mg by mouth daily.    ? gabapentin (NEURONTIN) 600 MG tablet Take 600 mg by mouth 3 (three) times daily.    ? gemfibrozil (LOPID) 600 MG tablet Take 1 tablet by mouth in the morning and at bedtime.    ? hydrochlorothiazide (HYDRODIURIL) 25 MG tablet TAKE 1 TABLET BY MOUTH DAILY. TAKE WITH LOSARTAN.    ? losartan (COZAAR) 100 MG tablet Take 100 mg by mouth daily.    ? metFORMIN (GLUCOPHAGE-XR) 750 MG 24 hr tablet Take 750 mg by mouth 2 (two) times daily.    ?  metoprolol succinate (TOPROL-XL) 50 MG 24 hr tablet Take 1 tablet (50 mg total) by mouth daily. Take with or immediately following a meal. 90 tablet 3  ? nitrofurantoin, macrocrystal-monohydrate, (MACROBID) 100 MG capsule Take 1 capsule (100 mg total) by mouth 2 (two) times daily. 20 capsule 0  ? phenazopyridine (PYRIDIUM) 200 MG tablet Take 1 tablet (200 mg total) by mouth 3 (three) times daily as needed for pain. 6 tablet 0  ? Semaglutide,0.25 or 0.5MG /DOS, 2 MG/1.5ML SOPN Inject into the skin.    ? traMADol (ULTRAM) 50 MG tablet Take 2 tablets (100 mg total) by mouth every 6 (six) hours as needed for moderate pain. 56 tablet 0  ? ?No current facility-administered medications on file prior to visit.  ? ? ?Allergies  ?Allergen Reactions  ? Sulfamethoxazole-Trimethoprim Swelling and Rash  ? Amoxil [Amoxicillin] Rash  ?   Has tolerated augmentin!! ? ? If had viral resh with amox.  ? Azithromycin Rash  ? Cefprozil Rash  ? Ceftin [Cefuroxime Axetil] Rash  ? Other Itching  ?  CHG-wipes she had to use prier to surgery.  Ok to use CHG wash without problems ?CHG-wipes she had to use prier to surgery.  Ok to use CHG wash without problems ?CHG-wipes she had to use prier to surgery.  Ok to use CHG wash without problems ?CHG-wipes she had to use prier to surgery.  Ok to use CHG wash without problems ?CHG-wipes she had to use prier to surgery.  Ok to use CHG wash without problems  ? Penicillins Rash  ?  Has patient had a  PCN reaction causing immediate rash, facial/tongue/throat swelling, SOB or lightheadedness with hypotension: Yes ?Has patient had a PCN reaction causing severe rash involving mucus membranes or skin necrosi

## 2021-08-11 ENCOUNTER — Encounter: Payer: Self-pay | Admitting: Obstetrics and Gynecology

## 2021-08-11 ENCOUNTER — Ambulatory Visit (INDEPENDENT_AMBULATORY_CARE_PROVIDER_SITE_OTHER): Payer: 59 | Admitting: Obstetrics and Gynecology

## 2021-08-11 VITALS — BP 117/86 | HR 79 | Ht 65.0 in | Wt 187.0 lb

## 2021-08-11 DIAGNOSIS — G8929 Other chronic pain: Secondary | ICD-10-CM | POA: Diagnosis not present

## 2021-08-11 DIAGNOSIS — Z8742 Personal history of other diseases of the female genital tract: Secondary | ICD-10-CM

## 2021-08-11 DIAGNOSIS — N760 Acute vaginitis: Secondary | ICD-10-CM

## 2021-08-11 DIAGNOSIS — E038 Other specified hypothyroidism: Secondary | ICD-10-CM

## 2021-08-11 DIAGNOSIS — Z01419 Encounter for gynecological examination (general) (routine) without abnormal findings: Secondary | ICD-10-CM

## 2021-08-11 DIAGNOSIS — R102 Pelvic and perineal pain: Secondary | ICD-10-CM | POA: Diagnosis not present

## 2021-08-11 DIAGNOSIS — E669 Obesity, unspecified: Secondary | ICD-10-CM

## 2021-08-11 MED ORDER — FLUCONAZOLE 150 MG PO TABS: 150.0000 mg | ORAL_TABLET | Freq: Once | ORAL | 3 refills | Status: AC

## 2021-08-12 ENCOUNTER — Encounter: Payer: Self-pay | Admitting: Obstetrics and Gynecology

## 2021-08-16 ENCOUNTER — Encounter: Payer: Self-pay | Admitting: Obstetrics and Gynecology

## 2021-08-19 DIAGNOSIS — E119 Type 2 diabetes mellitus without complications: Secondary | ICD-10-CM | POA: Diagnosis not present

## 2021-08-19 DIAGNOSIS — E785 Hyperlipidemia, unspecified: Secondary | ICD-10-CM | POA: Diagnosis not present

## 2021-08-24 DIAGNOSIS — R3589 Other polyuria: Secondary | ICD-10-CM | POA: Diagnosis not present

## 2021-08-24 DIAGNOSIS — E119 Type 2 diabetes mellitus without complications: Secondary | ICD-10-CM | POA: Diagnosis not present

## 2021-08-28 ENCOUNTER — Emergency Department
Admission: EM | Admit: 2021-08-28 | Discharge: 2021-08-28 | Disposition: A | Discharge status: Home / Self Care | Payer: 59 | Attending: Emergency Medicine | Admitting: Emergency Medicine

## 2021-08-28 ENCOUNTER — Encounter: Payer: Self-pay | Admitting: Emergency Medicine

## 2021-08-28 ENCOUNTER — Other Ambulatory Visit: Payer: Self-pay

## 2021-08-28 DIAGNOSIS — E871 Hypo-osmolality and hyponatremia: Secondary | ICD-10-CM | POA: Insufficient documentation

## 2021-08-28 DIAGNOSIS — E1165 Type 2 diabetes mellitus with hyperglycemia: Secondary | ICD-10-CM

## 2021-08-28 DIAGNOSIS — Z7984 Long term (current) use of oral hypoglycemic drugs: Secondary | ICD-10-CM | POA: Insufficient documentation

## 2021-08-28 DIAGNOSIS — E878 Other disorders of electrolyte and fluid balance, not elsewhere classified: Secondary | ICD-10-CM | POA: Insufficient documentation

## 2021-08-28 DIAGNOSIS — R112 Nausea with vomiting, unspecified: Secondary | ICD-10-CM | POA: Diagnosis not present

## 2021-08-28 DIAGNOSIS — R739 Hyperglycemia, unspecified: Secondary | ICD-10-CM | POA: Diagnosis present

## 2021-08-28 DIAGNOSIS — I10 Essential (primary) hypertension: Secondary | ICD-10-CM | POA: Insufficient documentation

## 2021-08-28 LAB — CBC WITH DIFFERENTIAL/PLATELET
Abs Immature Granulocytes: 0.03 10*3/uL (ref 0.00–0.07)
Basophils Absolute: 0 10*3/uL (ref 0.0–0.1)
Basophils Relative: 1 %
Eosinophils Absolute: 0.1 10*3/uL (ref 0.0–0.5)
Eosinophils Relative: 2 %
HCT: 40.6 % (ref 36.0–46.0)
Hemoglobin: 13.7 g/dL (ref 12.0–15.0)
Immature Granulocytes: 0 %
Lymphocytes Relative: 30 %
Lymphs Abs: 2.1 10*3/uL (ref 0.7–4.0)
MCH: 29.6 pg (ref 26.0–34.0)
MCHC: 33.7 g/dL (ref 30.0–36.0)
MCV: 87.7 fL (ref 80.0–100.0)
Monocytes Absolute: 0.5 10*3/uL (ref 0.1–1.0)
Monocytes Relative: 7 %
Neutro Abs: 4.3 10*3/uL (ref 1.7–7.7)
Neutrophils Relative %: 60 %
Platelets: 406 10*3/uL — ABNORMAL HIGH (ref 150–400)
RBC: 4.63 MIL/uL (ref 3.87–5.11)
RDW: 12.7 % (ref 11.5–15.5)
WBC: 7 10*3/uL (ref 4.0–10.5)
nRBC: 0 % (ref 0.0–0.2)

## 2021-08-28 LAB — BLOOD GAS, VENOUS
Acid-base deficit: 4.3 mmol/L — ABNORMAL HIGH (ref 0.0–2.0)
Bicarbonate: 21.6 mmol/L (ref 20.0–28.0)
Patient temperature: 37
pCO2, Ven: 42 mmHg — ABNORMAL LOW (ref 44–60)
pH, Ven: 7.32 (ref 7.25–7.43)

## 2021-08-28 LAB — BASIC METABOLIC PANEL
Anion gap: 14 (ref 5–15)
Anion gap: 11 (ref 5–15)
BUN: 13 mg/dL (ref 6–20)
BUN: 11 mg/dL (ref 6–20)
CO2: 19 mmol/L — ABNORMAL LOW (ref 22–32)
CO2: 20 mmol/L — ABNORMAL LOW (ref 22–32)
Calcium: 9.4 mg/dL (ref 8.9–10.3)
Calcium: 8.9 mg/dL (ref 8.9–10.3)
Chloride: 95 mmol/L — ABNORMAL LOW (ref 98–111)
Chloride: 101 mmol/L (ref 98–111)
Creatinine, Ser: 0.8 mg/dL (ref 0.44–1.00)
Creatinine, Ser: 0.66 mg/dL (ref 0.44–1.00)
GFR, Estimated: >60 mL/min (ref >60)
GFR, Estimated: >60 mL/min (ref >60)
Glucose, Bld: 521 mg/dL (ref 70–99)
Glucose, Bld: 370 mg/dL — ABNORMAL HIGH (ref 70–99)
Potassium: 3.8 mmol/L (ref 3.5–5.1)
Potassium: 4.3 mmol/L (ref 3.5–5.1)
Sodium: 128 mmol/L — ABNORMAL LOW (ref 135–145)
Sodium: 132 mmol/L — ABNORMAL LOW (ref 135–145)

## 2021-08-28 LAB — URINALYSIS, COMPLETE (UACMP) WITH MICROSCOPIC
Bilirubin Urine: NEGATIVE
Glucose, UA: >=500 mg/dL — AB
Hgb urine dipstick: NEGATIVE
Ketones, ur: 80 mg/dL — AB
Leukocytes,Ua: NEGATIVE
Nitrite: NEGATIVE
Protein, ur: NEGATIVE mg/dL
Specific Gravity, Urine: 1.028 (ref 1.005–1.030)
pH: 5 (ref 5.0–8.0)

## 2021-08-28 LAB — BETA-HYDROXYBUTYRIC ACID: Beta-Hydroxybutyric Acid: 3.79 mmol/L — ABNORMAL HIGH (ref 0.05–0.27)

## 2021-08-28 LAB — POC URINE PREG, ED: Preg Test, Ur: NEGATIVE

## 2021-08-28 LAB — CBG MONITORING, ED
Glucose-Capillary: 536 mg/dL (ref 70–99)
Glucose-Capillary: 333 mg/dL — ABNORMAL HIGH (ref 70–99)

## 2021-08-28 MED ORDER — BLOOD GLUCOSE MONITOR KIT: PACK | 0 refills | Status: DC

## 2021-08-28 MED ORDER — INSULIN GLARGINE 100 UNIT/ML SOLOSTAR PEN: 10.0000 [IU] | PEN_INJECTOR | Freq: Every day | SUBCUTANEOUS | 0 refills | Status: DC

## 2021-08-28 MED ORDER — ACETAMINOPHEN 500 MG PO TABS
1000.0000 mg | ORAL_TABLET | Freq: Once | ORAL | Status: AC
  Administered 2021-08-28: 1000 mg via ORAL
  Filled 2021-08-28: qty 2

## 2021-08-28 MED ORDER — LACTATED RINGERS IV BOLUS
1000.0000 mL | Freq: Once | INTRAVENOUS | Status: AC
  Administered 2021-08-28: 1000 mL via INTRAVENOUS

## 2021-08-28 MED ORDER — INSULIN GLARGINE-YFGN 100 UNIT/ML ~~LOC~~ SOLN
10.0000 [IU] | Freq: Once | SUBCUTANEOUS | Status: AC
  Administered 2021-08-28: 10 [IU] via SUBCUTANEOUS
  Filled 2021-08-28: qty 0.1

## 2021-08-28 MED ORDER — INSULIN ASPART 100 UNIT/ML IJ SOLN
0.0000 [IU] | INTRAMUSCULAR | Status: DC
  Administered 2021-08-28: 11 [IU] via SUBCUTANEOUS
  Filled 2021-08-28: qty 1

## 2021-08-28 NOTE — ED Provider Notes (Signed)
? ?[facility] ?Provider Note ? ? ? Event Date/Time  ? First MD Initiated Contact with Patient 08/28/21 1018   ?  (approximate) ? ? ?History  ? ?Hyperglycemia ? ? ?HPI ? ?[name] is a 41 y.o. female with past medical history of HTN, HDL, anxiety, endometriosis, and diabetes currently on metformin and Ozempic who presents for evaluation of high blood sugars.  Patient states he was told her A1c was elevated and so she keeping a log of her sugars.  She states over the last month she has had some nausea and vomiting as well as increased thirst, blurry vision and urinary frequency.  She states a couple weeks ago she was treated for UTI but did not have any more burning urination.  No chest pain, cough, shortness of breath, fevers, headache earache, sore throat, diarrhea rash or extremity pain.  No recent falls or injuries.  She denies any recent steroids or any tobacco use illicit drug use or EtOH use.  No other acute concerns at this time. ? ?  ?Past Medical History:  ?Diagnosis Date  ? Abnormal Pap smear   ? Anxiety   ? Endometriosis   ? Family history of hemochromatosis   ? mother   ? HA (headache)   ? otc med prn  ? Hyperlipidemia   ? diet controlled, no meds  ? Hypertension   ? PONV (postoperative nausea and vomiting)   ? Pregnancy induced hypertension 2008  ? ? ? ?Physical Exam  ?Triage Vital Signs: ?ED Triage Vitals  ?Enc Vitals Group  ?   BP 08/28/21 1008 122/82  ?   Pulse Rate 08/28/21 1008 89  ?   Resp 08/28/21 1008 18  ?   Temp 08/28/21 1008 98.1 ?F (36.7 ?C)  ?   Temp src --   ?   SpO2 08/28/21 1008 97 %  ?   Weight 08/28/21 1006 187 lb 6.3 oz (85 kg)  ?   Height 08/28/21 1006 5' 5" (1.651 m)  ?   Head Circumference --   ?   Peak Flow --   ?   Pain Score 08/28/21 1006 0  ?   Pain Loc --   ?   Pain Edu? --   ?   Excl. in [place]? --   ? ? ?Most recent vital signs: ?Vitals:  ? 08/28/21 1230 08/28/21 1300  ?BP: 110/62 112/70  ?Pulse: 76 77  ?Resp: 16 18  ?Temp:    ?SpO2: 97% 97%   ? ? ?General: Awake, no distress.  ?CV:  Good peripheral perfusion.  2+ radial pulses. ?Resp:  Normal effort.  Clear. ?Abd:  No distention.  Soft throughout. ?Other:  Dry mucous membranes. ? ? ?ED Results / Procedures / Treatments  ?Labs ?(all labs ordered are listed, but only abnormal results are displayed) ?Labs Reviewed  ?URINALYSIS, COMPLETE (UACMP) WITH MICROSCOPIC - Abnormal; Notable for the following components:  ?    Result Value  ? Color, Urine STRAW (*)   ? APPearance CLEAR (*)   ? Glucose, UA >=500 (*)   ? Ketones, ur 80 (*)   ? Bacteria, UA RARE (*)   ? All other components within normal limits  ?CBC WITH DIFFERENTIAL/PLATELET - Abnormal; Notable for the following components:  ? Platelets 406 (*)   ? All other components within normal limits  ?BASIC METABOLIC PANEL - Abnormal; Notable for the following components:  ? Sodium 128 (*)   ? Chloride 95 (*)   ?  CO2 19 (*)   ? Glucose, Bld 521 (*)   ? All other components within normal limits  ?BETA-HYDROXYBUTYRIC ACID - Abnormal; Notable for the following components:  ? Beta-Hydroxybutyric Acid 3.79 (*)   ? All other components within normal limits  ?BLOOD GAS, VENOUS - Abnormal; Notable for the following components:  ? pCO2, Ven 42 (*)   ? Acid-base deficit 4.3 (*)   ? All other components within normal limits  ?BASIC METABOLIC PANEL - Abnormal; Notable for the following components:  ? Sodium 132 (*)   ? CO2 20 (*)   ? Glucose, Bld 370 (*)   ? All other components within normal limits  ?CBG MONITORING, ED - Abnormal; Notable for the following components:  ? Glucose-Capillary 536 (*)   ? All other components within normal limits  ?CBG MONITORING, ED - Abnormal; Notable for the following components:  ? Glucose-Capillary 333 (*)   ? All other components within normal limits  ?POC URINE PREG, ED  ? ? ? ?EKG ? ?ECG is remarkable for sinus rhythm with a borderline nonspecific ST change in lead III versus artifact without any other clear evidence of acute ischemia  or significant arrhythmia. ? ? ?RADIOLOGY ? ? ? ?PROCEDURES: ? ?Critical Care performed: No ? ?Procedures ? ? ? ?MEDICATIONS ORDERED IN ED: ?Medications  ?insulin aspart (novoLOG) injection 0-15 Units (11 Units Subcutaneous Given 08/28/21 1300)  ?lactated ringers bolus 1,000 mL (0 mLs Intravenous Stopped 08/28/21 1153)  ?lactated ringers bolus 1,000 mL (0 mLs Intravenous Stopped 08/28/21 1255)  ?acetaminophen (TYLENOL) tablet 1,000 mg (1,000 mg Oral Given 08/28/21 1140)  ?insulin glargine-yfgn (SEMGLEE) injection 10 Units (10 Units Subcutaneous Given 08/28/21 1257)  ? ? ? ?IMPRESSION / MDM / ASSESSMENT AND PLAN / ED COURSE  ?I reviewed the triage vital signs and the nursing notes. ?             ?               ? ?Differential diagnosis includes, but is not limited to symptomatic hyperglycemia, HHS, DKA, electrolyte derangements, kidney injury, possible gastritis and atypical ACS.  We will also check for cystitis with UA.  Otherwise does not appear septic and have a low suspicion for toxic ingestion or other significant derangement at this time.  ECG is not acute cardiac ischemia at this time. ? ?Patient tested negative.  Initial BMP is remarkable for sodium of 128, chloride of 95, bicarb 19 glucose of 521 with anion gap of 14, no other significant derangements.  CBC without leukocytosis or acute anemia.  UA is remarkable for greater than 500 glucose and some ketones but no evidence of section.  Hydroxybutyrate is elevated 3.7.  VBG with a pH of 7.32 with a PCO2 of 42 and bicarb of 21.6. ? ?Overall picture is consistent with hyperglycemia with ketosis but patient is not severely acidotic.  Her anion gap is normal and her bicarb is greater than 18 and I do not believe requires insulin drip.  We will hydrate with IV fluids and give some insulin and plan to recheck her BMP.  In addition she has no acute symptoms but seems she has been symptomatic for close to a month Which requires additional testing otherwise at this  time. ? ?Repeat BMP shows sodium of 132, bicarb of 28 and glucose improved to 370 from 521 with anion gap of 11.  At this point I think patient is appropriate for continued outpatient evaluation and management.  We will  start on low-dose Lantus.  Advised her to continue her other diabetes medicines.  She has Zofran does not need any refills at that time.  Discussed returning for any new or worsening symptoms.  Discharged in stable condition.  Strict return precautions advised and discussed. ? ?  ? ? ?FINAL CLINICAL IMPRESSION(S) / ED DIAGNOSES  ? ?Final diagnoses:  ?Hyperglycemia  ?Type 2 diabetes mellitus with hyperglycemia, unspecified whether long term insulin use (Susan Moore)  ? ? ? ?Rx / DC Orders  ? ?ED Discharge Orders   ? ?      Ordered  ?  insulin glargine (LANTUS) 100 UNIT/ML Solostar Pen  Daily       ? 08/28/21 1256  ?  blood glucose meter kit and supplies KIT       ? 08/28/21 1301  ? ?  ?  ? ?  ? ? ? ?Note:  This document was prepared using Dragon voice recognition software and may include unintentional dictation errors. ?  ?Lucrezia Starch, MD ?08/28/21 1335 ? ?

## 2021-08-28 NOTE — ED Triage Notes (Addendum)
Pt reports has diabetes and has been taking meds as prescribed (metformin and ozempic). Pt states this am her glucose reading was 508. Pt reports feels tired, thirsty and nauseated. Pt states has been discussing insulin with her MD and is supposed to be keeping a log of her reading but admits she doesn't do that daily.  ?

## 2021-08-29 ENCOUNTER — Other Ambulatory Visit: Payer: Self-pay | Admitting: Obstetrics and Gynecology

## 2021-08-29 ENCOUNTER — Encounter: Payer: Self-pay | Admitting: Obstetrics and Gynecology

## 2021-08-29 DIAGNOSIS — R739 Hyperglycemia, unspecified: Secondary | ICD-10-CM | POA: Diagnosis present

## 2021-08-29 DIAGNOSIS — Z79899 Other long term (current) drug therapy: Secondary | ICD-10-CM | POA: Diagnosis not present

## 2021-08-29 DIAGNOSIS — Z794 Long term (current) use of insulin: Secondary | ICD-10-CM | POA: Diagnosis not present

## 2021-08-29 DIAGNOSIS — Z7984 Long term (current) use of oral hypoglycemic drugs: Secondary | ICD-10-CM | POA: Insufficient documentation

## 2021-08-29 DIAGNOSIS — I10 Essential (primary) hypertension: Secondary | ICD-10-CM | POA: Diagnosis not present

## 2021-08-29 DIAGNOSIS — E1165 Type 2 diabetes mellitus with hyperglycemia: Secondary | ICD-10-CM | POA: Diagnosis not present

## 2021-08-29 DIAGNOSIS — E119 Type 2 diabetes mellitus without complications: Secondary | ICD-10-CM | POA: Diagnosis not present

## 2021-08-29 LAB — CBG MONITORING, ED: Glucose-Capillary: 504 mg/dL (ref 70–99)

## 2021-08-29 LAB — CBC
HCT: 37.7 % (ref 36.0–46.0)
Hemoglobin: 12.7 g/dL (ref 12.0–15.0)
MCH: 29.5 pg (ref 26.0–34.0)
MCHC: 33.7 g/dL (ref 30.0–36.0)
MCV: 87.7 fL (ref 80.0–100.0)
Platelets: 368 10*3/uL (ref 150–400)
RBC: 4.3 MIL/uL (ref 3.87–5.11)
RDW: 12.8 % (ref 11.5–15.5)
WBC: 6 10*3/uL (ref 4.0–10.5)
nRBC: 0 % (ref 0.0–0.2)

## 2021-08-29 LAB — BASIC METABOLIC PANEL
Anion gap: 12 (ref 5–15)
BUN: 12 mg/dL (ref 6–20)
CO2: 21 mmol/L — ABNORMAL LOW (ref 22–32)
Calcium: 9.3 mg/dL (ref 8.9–10.3)
Chloride: 98 mmol/L (ref 98–111)
Creatinine, Ser: 0.69 mg/dL (ref 0.44–1.00)
GFR, Estimated: >60 mL/min (ref >60)
Glucose, Bld: 485 mg/dL — ABNORMAL HIGH (ref 70–99)
Potassium: 3.6 mmol/L (ref 3.5–5.1)
Sodium: 131 mmol/L — ABNORMAL LOW (ref 135–145)

## 2021-08-29 LAB — URINALYSIS, ROUTINE W REFLEX MICROSCOPIC
Bacteria, UA: NONE SEEN
Bilirubin Urine: NEGATIVE
Glucose, UA: >=500 mg/dL — AB
Hgb urine dipstick: NEGATIVE
Ketones, ur: 80 mg/dL — AB
Leukocytes,Ua: NEGATIVE
Nitrite: NEGATIVE
Protein, ur: NEGATIVE mg/dL
Specific Gravity, Urine: 1.031 — ABNORMAL HIGH (ref 1.005–1.030)
pH: 5 (ref 5.0–8.0)

## 2021-08-29 MED ORDER — SODIUM CHLORIDE 0.9 % IV BOLUS
1000.0000 mL | Freq: Once | INTRAVENOUS | Status: AC
  Administered 2021-08-29: 1000 mL via INTRAVENOUS

## 2021-08-29 NOTE — ED Triage Notes (Signed)
Pt presents via POV c/o hyperglycemia. Pt report home CBG 590. Report takes insulin and Metformin for DM. Reports increased thirst and polydipsia.  ?

## 2021-08-30 ENCOUNTER — Other Ambulatory Visit: Payer: Self-pay | Admitting: Obstetrics and Gynecology

## 2021-08-30 ENCOUNTER — Encounter: Payer: Self-pay | Admitting: *Deleted

## 2021-08-30 ENCOUNTER — Emergency Department
Admission: EM | Admit: 2021-08-30 | Discharge: 2021-08-30 | Disposition: A | Discharge status: Home / Self Care | Payer: 59 | Attending: Emergency Medicine | Admitting: Emergency Medicine

## 2021-08-30 ENCOUNTER — Encounter: Payer: 59 | Attending: Family Medicine | Admitting: *Deleted

## 2021-08-30 VITALS — BP 114/78 | Ht 65.0 in | Wt 192.7 lb

## 2021-08-30 DIAGNOSIS — R739 Hyperglycemia, unspecified: Secondary | ICD-10-CM

## 2021-08-30 DIAGNOSIS — Z6835 Body mass index (BMI) 35.0-35.9, adult: Secondary | ICD-10-CM | POA: Diagnosis not present

## 2021-08-30 DIAGNOSIS — Z713 Dietary counseling and surveillance: Secondary | ICD-10-CM | POA: Diagnosis not present

## 2021-08-30 DIAGNOSIS — E669 Obesity, unspecified: Secondary | ICD-10-CM | POA: Diagnosis not present

## 2021-08-30 DIAGNOSIS — E1165 Type 2 diabetes mellitus with hyperglycemia: Secondary | ICD-10-CM

## 2021-08-30 DIAGNOSIS — E119 Type 2 diabetes mellitus without complications: Secondary | ICD-10-CM | POA: Diagnosis not present

## 2021-08-30 LAB — BLOOD GAS, VENOUS
Acid-base deficit: 5.1 mmol/L — ABNORMAL HIGH (ref 0.0–2.0)
Bicarbonate: 19.9 mmol/L — ABNORMAL LOW (ref 20.0–28.0)
O2 Saturation: 92.5 %
Patient temperature: 37
pCO2, Ven: 36 mmHg — ABNORMAL LOW (ref 44–60)
pH, Ven: 7.35 (ref 7.25–7.43)
pO2, Ven: 60 mmHg — ABNORMAL HIGH (ref 32–45)

## 2021-08-30 LAB — CBG MONITORING, ED
Glucose-Capillary: 268 mg/dL — ABNORMAL HIGH (ref 70–99)
Glucose-Capillary: 379 mg/dL — ABNORMAL HIGH (ref 70–99)

## 2021-08-30 LAB — BETA-HYDROXYBUTYRIC ACID: Beta-Hydroxybutyric Acid: 2.43 mmol/L — ABNORMAL HIGH (ref 0.05–0.27)

## 2021-08-30 MED ORDER — TRAMADOL HCL 50 MG PO TABS: 100.0000 mg | ORAL_TABLET | Freq: Four times a day (QID) | ORAL | 0 refills | Status: DC | PRN

## 2021-08-30 MED ORDER — KETOROLAC TROMETHAMINE 30 MG/ML IJ SOLN
10.0000 mg | Freq: Once | INTRAMUSCULAR | Status: AC
  Administered 2021-08-30: 9.9 mg via INTRAVENOUS
  Filled 2021-08-30: qty 1

## 2021-08-30 MED ORDER — SODIUM CHLORIDE 0.9 % IV BOLUS
1000.0000 mL | Freq: Once | INTRAVENOUS | Status: AC
  Administered 2021-08-30: 1000 mL via INTRAVENOUS

## 2021-08-30 MED ORDER — METOCLOPRAMIDE HCL 5 MG/ML IJ SOLN
5.0000 mg | Freq: Once | INTRAMUSCULAR | Status: AC
  Administered 2021-08-30: 5 mg via INTRAVENOUS
  Filled 2021-08-30: qty 2

## 2021-08-30 NOTE — ED Notes (Signed)
ED Provider at bedside. 

## 2021-08-30 NOTE — ED Provider Notes (Signed)
? ?[facility] ?Provider Note ? ? ? Event Date/Time  ? First MD Initiated Contact with Patient 08/30/21 0228   ?  (approximate) ? ? ?History  ? ?Hyperglycemia ? ? ?HPI ? ?[name] is a 41 y.o. female who presents to the ED from home with a chief complaint of hyperglycemia.  With a history of diabetes who recently has had symptoms of increased thirst, polydipsia, generalized malaise and headaches.  She was seen in the ED 2 days ago with same symptoms, treated with IV fluids and discharged home on Lantus.  She followed up with her PCP the following day who placed her on Basaglar; her insurance would not pay for Lantus.  She was instructed to keep a log of her blood sugars.  States she went home and took the 20 units her PCP prescribed but her blood sugars were above 500 in the evening so she called the on-call service and was referred to the ED for evaluation.  Denies fever, cough, chest pain, shortness of breath, abdominal pain, vomiting or dizziness.  Has had chronic nausea since starting Ozempic last December.  UTI several weeks ago but no symptoms recently.  Has not been on steroids. ?  ? ? ?Past Medical History  ? ?Past Medical History:  ?Diagnosis Date  ?? Abnormal Pap smear   ?? Anxiety   ?? Endometriosis   ?? Family history of hemochromatosis   ? mother   ?? HA (headache)   ? otc med prn  ?? Hyperlipidemia   ? diet controlled, no meds  ?? Hypertension   ?? PONV (postoperative nausea and vomiting)   ?? Pregnancy induced hypertension 2008  ? ? ? ?Active Problem List  ? ?Patient Active Problem List  ? Diagnosis Date Noted  ?? Dyshidrotic eczema 12/17/2018  ?? Subclinical hypothyroidism 12/17/2018  ?? Subungual hematoma of toe 12/17/2018  ?? Class 2 severe obesity due to excess calories with serious comorbidity and body mass index (BMI) of 37.0 to 37.9 in adult [facility]) 11/27/2018  ?? Family history of hemochromatosis 11/27/2018  ?? Multiple drug allergies 11/27/2018  ?? Osteoarthritis  of spine with radiculopathy, lumbar region 06/20/2016  ?? H/O abdominal surgery 02/09/2016  ?? Chronic pelvic pain in female 11/30/2014  ?? Endometriosis 11/30/2014  ?? Hypertension 09/01/2014  ?? Cyst of ovary 09/01/2014  ?? Class 2 obesity 09/01/2014  ?? Anxiety state 12/11/2013  ?? Routine general medical examination at a health care facility 04/06/2011  ?? Metabolic syndrome 16/38/4536  ?? Insomnia 12/20/2007  ?? ALLERGIC RHINITIS 07/18/2007  ?? Other hyperlipidemia 02/19/2007  ? ? ? ?Past Surgical History  ? ?Past Surgical History:  ?Procedure Laterality Date  ?? ABDOMINAL HYSTERECTOMY    ?? ABLATION ON ENDOMETRIOSIS N/A 06/04/2014  ? Procedure: ABLATION ON ENDOMETRIOSIS;  Surgeon: Allena Katz, MD;  Location: [facility];  Service: Gynecology;  Laterality: N/A;  ?? BIOPSY  12/01/2011  ? Procedure: BIOPSY;  Surgeon: Allena Katz, MD;  Location: [facility];  Service: Gynecology;  Laterality: N/A;  ?? CESAREAN SECTION  2008  ?? CESAREAN SECTION WITH BILATERAL TUBAL LIGATION N/A 12/02/2012  ? Procedure: Repeat Cesarean Section Delivery Baby Girl @ 4680, Apgars 8/9, Bilateral Tubal Ligation;  Surgeon: Allena Katz, MD;  Location: [facility];  Service: Obstetrics;  Laterality: N/A;  ?? COLPOSCOPY    ? as teenager, results WNL  ?? ENDOMETRIAL BIOPSY    ?? LAPAROSCOPIC ASSISTED VAGINAL HYSTERECTOMY  2012  ? with bilateral salpingectomy  ??  LAPAROSCOPIC BILATERAL SALPINGECTOMY Bilateral 02/15/2015  ? Procedure: LAPAROSCOPIC BILATERAL SALPINGECTOMY;  Surgeon: Rubie Maid, MD;  Location: [facility];  Service: Gynecology;  Laterality: Bilateral;  ?? LAPAROSCOPIC LYSIS OF ADHESIONS N/A 06/04/2014  ? Procedure: LAPAROSCOPIC LYSIS OF ADHESIONS;  Surgeon: Allena Katz, MD;  Location: [facility];  Service: Gynecology;  Laterality: N/A;  ?? LAPAROSCOPIC SALPINGO OOPHERECTOMY Right 07/17/2016  ? Procedure: LAPAROSCOPIC RIGHT SALPINGO OOPHORECTOMY;  Surgeon: Rubie Maid, MD;  Location: [facility];  Service: Gynecology;   Laterality: Right;  ?? LAPAROSCOPY  12/01/2011  ? Procedure: LAPAROSCOPY OPERATIVE;  Surgeon: Allena Katz, MD;  Location: [facility];  Service: Gynecology;  Laterality: N/A;  ?? LAPAROSCOPY N/A 06/04/2014  ? Procedure: LAPAROSCOPY OPERATIVE;  Surgeon: Allena Katz, MD;  Location: [facility];  Service: Gynecology;  Laterality: N/A;  ?? LAPAROSCOPY N/A 02/14/2016  ? Procedure: LAPAROSCOPY DIAGNOSTIC WITH BIOPSIES POSSIBLE ADHESIOLYSIS;  Surgeon: Rubie Maid, MD;  Location: [facility];  Service: Gynecology;  Laterality: N/A;  ?? LAPAROSCOPY N/A 05/26/2016  ? Procedure: LAPAROSCOPY operative with lysis of adhesions;  Surgeon: Harlin Heys, MD;  Location: [facility];  Service: Gynecology;  Laterality: N/A;  ?? OOPHORECTOMY Left   ?? SALPINGECTOMY Bilateral   ?? TONSILLECTOMY    ?? TUBAL LIGATION    ?? TYMPANOSTOMY TUBE PLACEMENT    ? ? ? ?Home Medications  ? ?Prior to Admission medications   ?Medication Sig Start Date End Date Taking? Authorizing Provider  ?amLODipine (NORVASC) 10 MG tablet Take 1 tablet by mouth daily. 05/03/21   [provider]  ?atorvastatin (LIPITOR) 20 MG tablet Take 20 mg by mouth daily. 06/29/21   [provider]  ?blood glucose meter kit and supplies KIT Dispense based on patient and insurance preference. Use up to four times daily as directed. 08/28/21   Lucrezia Starch, MD  ?estradiol (ESTRACE) 1 MG tablet Take 1 mg by mouth daily. 03/29/20   [provider]  ?gabapentin (NEURONTIN) 600 MG tablet Take 600 mg by mouth 3 (three) times daily. 07/28/21   [provider]  ?gemfibrozil (LOPID) 600 MG tablet Take 1 tablet by mouth in the morning and at bedtime. 07/16/20 08/03/21  [provider]  ?hydrochlorothiazide (HYDRODIURIL) 25 MG tablet TAKE 1 TABLET BY MOUTH DAILY. TAKE WITH LOSARTAN. 04/29/21   [provider]  ?insulin glargine (LANTUS) 100 UNIT/ML Solostar Pen Inject 10 Units into the skin daily for 14 days. 08/28/21 09/11/21  Lucrezia Starch, MD  ?losartan (COZAAR) 100 MG tablet Take 100 mg by mouth daily. 04/20/21   [provider]  ?metFORMIN (GLUCOPHAGE-XR) 750 MG 24 hr tablet Take 750 mg by mouth 2 (two) times daily. 06/09/21   [provider]  ?metoprolol succinate (TOPROL-XL) 50 MG 24 hr tablet Take 1 tablet (50 mg total) by mouth daily. Take with or immediately following a meal. 10/24/19   Sable Feil, PA-C  ?nitrofurantoin, macrocrystal-monohydrate, (MACROBID) 100 MG capsule Take 1 capsule (100 mg total) by mouth 2 (two) times daily. 08/03/21   Sable Feil, PA-C  ?phenazopyridine (PYRIDIUM) 200 MG tablet Take 1 tablet (200 mg total) by mouth 3 (three) times daily as needed for pain. 08/03/21   Sable Feil, PA-C  ?traMADol (ULTRAM) 50 MG tablet Take 2 tablets (100 mg total) by mouth every 6 (six) hours as needed for moderate pain. 08/04/21   Rubie Maid, MD  ? ? ? ?Allergies  ?Sulfamethoxazole-trimethoprim, Amoxil [amoxicillin], Azithromycin, Cefprozil, Ceftin [cefuroxime axetil], Other, Penicillins,  and Sulfa antibiotics ? ? ?Family History  ? ?Family History  ?Problem Relation Age of Onset  ?? Hypertension Father   ?? Coronary artery disease Paternal Uncle   ?? Kidney disease Paternal Grandfather   ?? Cancer Paternal Grandfather   ?? Hypertension Mother   ?? Hemochromatosis Mother   ?? Breast cancer Paternal Aunt   ?? Ovarian cancer Maternal Grandmother   ?? Lung cancer Maternal Grandfather   ?? Brain cancer Maternal Grandfather   ?? CVA Paternal Grandmother   ?? Heart attack Paternal Grandmother   ?? Diabetes Neg Hx   ? ? ? ?Physical Exam  ?Triage Vital Signs: ?ED Triage Vitals [08/29/21 2252]  ?Enc Vitals Group  ?   BP (!) 131/100  ?   Pulse Rate 87  ?   Resp 14  ?   Temp 98.1 ?F (36.7 ?C)  ?   Temp Source Oral  ?   SpO2 96 %  ?   Weight   ?   Height   ?   Head Circumference   ?   Peak Flow   ?   Pain Score   ?   Pain Loc   ?   Pain Edu?   ?   Excl. in [place]?   ? ? ?Updated Vital Signs: ?BP 130/88   Pulse 86   Temp  98.1 ?F (36.7 ?C) (Oral)   Resp 16   LMP 10/15/2013   SpO2 96%  ? ? ?General: Awake, no distress.  ?CV:  RRR.  Good peripheral perfusion.  ?Resp:  Normal effort.  CTA B. ?Abd:  Nontender.  No distention.  ?Other:

## 2021-08-30 NOTE — Patient Instructions (Signed)
Check blood sugars 2 x day before breakfast and before supper every day or as needed ?Bring blood sugar records to the next class ? ?Exercise:  Walk  for   15  minutes   3  days a week and gradually increase to 30 minutes 5 x week ? ?Eat 3 meals day,   1-2  snacks a day ?Space meals 4-6 hours apart ?Allow 2-3 hours between meals and snacks ?Include 1 serving of protein with each meal ? ?Carry fast acting glucose and a snack at all times ?Rotate injection sites ? ?Return for classes on:    ?

## 2021-08-30 NOTE — Progress Notes (Signed)
Diabetes Self-Management Education ? ?Visit Type: First/Initial ? ?Appt. Start Time: 0855 Appt. End Time: 1005 ? ?08/30/2021 ? ?[name], identified by name and date of birth, is a 41 y.o. female with a diagnosis of Diabetes: Type 2.  ? ?ASSESSMENT ? ?Blood pressure 114/78, height 5\' 5"  (1.651 m), weight 192 lb 11.2 oz (87.4 kg), last menstrual period 10/15/2013. ?Body mass index is 32.07 kg/m?. ? ? Diabetes Self-Management Education - 08/30/21 1034   ? ?  ? Visit Information  ? Visit Type First/Initial   ?  ? Initial Visit  ? Diabetes Type Type 2   ? Date Diagnosed Summer 2022   ? Are you currently following a meal plan? No   "just trying to make some healthier choices"  ? Are you taking your medications as prescribed? Yes   ?  ? Health Coping  ? How would you rate your overall health? Fair   ?  ? Psychosocial Assessment  ? Patient Belief/Attitude about Diabetes Other (comment)   "very sad and emotional, had crying episode yesterday"  ? What is the hardest part about your diabetes right now, causing you the most concern, or is the most worrisome to you about your diabetes?   Checking blood sugar   ? Self-care barriers None   ? Self-management support Doctor's office;Friends;Family   ? Patient Concerns Nutrition/Meal planning;Medication;Glycemic Control;Weight Control;Monitoring;Healthy Lifestyle   ? Special Needs None   ? Preferred Learning Style Visual;Other (comment)   talking/discussion  ? Learning Readiness Ready   ? How often do you need to have someone help you when you read instructions, pamphlets, or other written materials from your doctor or pharmacy? 1 - Never   ? What is the last grade level you completed in school? 12th and 1 year at [employer]   ?  ? Pre-Education Assessment  ? Patient understands the diabetes disease and treatment process. Needs Review   ? Patient understands incorporating nutritional management into lifestyle. Needs Instruction   ? Patient undertands incorporating physical  activity into lifestyle. Needs Instruction   ? Patient understands using medications safely. Needs Review   ? Patient understands monitoring blood glucose, interpreting and using results Needs Review   ? Patient understands prevention, detection, and treatment of acute complications. Needs Instruction   ? Patient understands prevention, detection, and treatment of chronic complications. Needs Review   ? Patient understands how to develop strategies to address psychosocial issues. Needs Instruction   ? Patient understands how to develop strategies to promote health/change behavior. Needs Instruction   ?  ? Complications  ? Last HgB A1C per patient/outside source 11.3 %   08/19/2021  ? How often do you check your blood sugar? 3-4 times/day   ? Fasting Blood glucose range (mg/dL) 10/19/2021   BG's >400 mg/dL.  ? Number of hyperglycemic episodes ( >200mg /dL): Daily   ? Can you tell when your blood sugar is high? Yes   headache, thirsty  ? What do you do if your blood sugar is high? seen in ER twice in last 3 days   ? Have you had a dilated eye exam in the past 12 months? Yes   ? Have you had a dental exam in the past 12 months? Yes   ? Are you checking your feet? No   ?  ? Dietary Intake  ? Breakfast oatmeal yogurt bar; oatmeal; egg biscuit   ? Snack (morning) 2-3 snacks/day - fruit (apple, melon, strawberries, peaches), yogurt, Goldfish crackers   ?  Lunch soup, salad, grilled chicken sandwich   ? Dinner chicken, pork, beef, fish; potatoes, peas, beans, corn, pasta, occasional rice, green beans, broccoli, zucchini, peppers, salad with lettuce cheese and cuccumbers   ? Beverage(s) water, diet ginger-ale   ?  ? Activity / Exercise  ? Activity / Exercise Type ADL's   ?  ? Patient Education  ? Previous Diabetes Education No   ? Disease Pathophysiology Definition of diabetes, type 1 and 2, and the diagnosis of diabetes;Factors that contribute to the development of diabetes;Explored patient's options for treatment of their  diabetes   ? Healthy Eating Role of diet in the treatment of diabetes and the relationship between the three main macronutrients and blood glucose level;Food label reading, portion sizes and measuring food.;Reviewed blood glucose goals for pre and post meals and how to evaluate the patients' food intake on their blood glucose level.;Meal timing in regards to the patients' current diabetes medication.   ? Being Active Role of exercise on diabetes management, blood pressure control and cardiac health.;Other (comment)   No strenuous exercise until BG's less than 250  ? Medications Taught/reviewed insulin/injectables, injection, site rotation, insulin/injectables storage and needle disposal.;Reviewed patients medication for diabetes, action, purpose, timing of dose and side effects.   ? Monitoring Purpose and frequency of SMBG.;Taught/discussed recording of test results and interpretation of SMBG.;Identified appropriate SMBG and/or A1C goals.   ? Acute complications Taught prevention, symptoms, and  treatment of hypoglycemia - the 15 rule.   ? Chronic complications Relationship between chronic complications and blood glucose control   ? Diabetes Stress and Support Identified and addressed patients feelings and concerns about diabetes;Role of stress on diabetes   Pt currently going through separation with spouse.  ?  ? Individualized Goals (developed by patient)  ? Reducing Risk Other (comment)   improve blood sugars, decrease medications, prevent diabetes complications, lsoe weight, lead a healthier lifestyle, become more fit  ?  ? Outcomes  ? Expected Outcomes Demonstrated interest in learning. Expect positive outcomes   ? Future DMSE 4-6 wks   ? ?  ?  ? ?Individualized Plan for Diabetes Self-Management Training:  ? ?Learning Objective:  Patient will have a greater understanding of diabetes self-management. ?Patient education plan is to attend individual and/or group sessions per assessed needs and concerns. ?  ?Plan:   ? ?Patient Instructions  ?Check blood sugars 2 x day before breakfast and before supper every day or as needed ?Bring blood sugar records to the next class ? ?Exercise:  Walk  for   15  minutes   3  days a week and gradually increase to 30 minutes 5 x week ? ?Eat 3 meals day,   1-2  snacks a day ?Space meals 4-6 hours apart ?Allow 2-3 hours between meals and snacks ?Include 1 serving of protein with each meal ? ?Carry fast acting glucose and a snack at all times ?Rotate injection sites ? ?Expected Outcomes:  Demonstrated interest in learning. Expect positive outcomes ? ?Education material provided:  ?Advertising account executive Guidelines ?Simple Meal Plan ?Glucose tablets ?Symptoms, causes and treatments of Hypoglycemia ?  ?If problems or questions, patient to contact team via:   ?Sharion Settler, RN, CCM, CDCES [phone] ? ?Future DSME appointment: 4-6 wks ?September 26, 2021 for Diabetes Class 1 ?

## 2021-08-30 NOTE — ED Notes (Signed)
Pt discharge information reviewed. Pt understands need for follow up care and when to return if symptoms worsen. All questions answered. Pt is alert and oriented with even and regular respirations. Pt is seen ambulating out of department with string steady gait.   

## 2021-08-30 NOTE — Discharge Instructions (Signed)
Continue to take medicines as directed by your doctor.  Return to the ER for worsening symptoms, persistent vomiting, difficulty breathing or other concerns. ?

## 2021-08-31 ENCOUNTER — Encounter: Payer: Self-pay | Admitting: Obstetrics and Gynecology

## 2021-08-31 ENCOUNTER — Ambulatory Visit (INDEPENDENT_AMBULATORY_CARE_PROVIDER_SITE_OTHER): Payer: 59 | Admitting: Obstetrics and Gynecology

## 2021-08-31 VITALS — BP 115/85 | HR 88 | Resp 16 | Ht 65.0 in | Wt 195.8 lb

## 2021-08-31 DIAGNOSIS — E119 Type 2 diabetes mellitus without complications: Secondary | ICD-10-CM | POA: Diagnosis not present

## 2021-08-31 DIAGNOSIS — K640 First degree hemorrhoids: Secondary | ICD-10-CM

## 2021-08-31 DIAGNOSIS — B3731 Acute candidiasis of vulva and vagina: Secondary | ICD-10-CM

## 2021-08-31 MED ORDER — HYDROCORTISONE ACETATE 25 MG RE SUPP
25.0000 mg | Freq: Two times a day (BID) | RECTAL | 1 refills | Status: DC
Start: 2021-08-31 — End: 2021-12-06

## 2021-08-31 MED ORDER — TERCONAZOLE 0.4 % VA CREA: 1.0000 | TOPICAL_CREAM | Freq: Every day | VAGINAL | 0 refills | Status: AC

## 2021-08-31 NOTE — Progress Notes (Signed)
    GYNECOLOGY PROGRESS NOTE  Subjective:    Patient ID: [name], female    DOB: 22-Oct-1980, 41 y.o.   MRN: [id number]  HPI  Patient is a 41 y.o. G87P1102 female who presents for vaginal itching and irritation. She has been having these symptoms x 2 weeks.  She was seen several weeks ago due to complaints of UTI.  Notes that she took the antibiotics as prescribed.  Was given Diflucan for suspected yeast infection that was noted on exam.  She has taking Diflucan three times with very little relief. Notes that she has been having some issues with her blood sugars, has been to the ER twice. Blood sugars have been in the 500s.  Also having issues with hemorrhoids.   The following portions of the patient's history were reviewed and updated as appropriate: allergies, current medications, past family history, past medical history, past social history, past surgical history, and problem list.  Review of Systems Pertinent items are noted in HPI.   Objective:   Blood pressure 115/85, pulse 88, resp. rate 16, height 5\' 5"  (1.651 m), weight 195 lb 12.8 oz (88.8 kg), last menstrual period 10/15/2013.  Multiple pulm Body mass index is 32.58 kg/m. General appearance: alert, cooperative, and no distress Abdomen: soft, non-tender; bowel sounds normal; no masses,  no organomegaly Pelvic: external genitalia normal, rectovaginal septum normal.  Vagina with moderate thick white discharge.  Discharge also extending downward towards rectal area. Cervix normal appearing, no lesions and no motion tenderness.  Uterus mobile, nontender, normal shape and size.  Adnexae non-palpable, nontender bilaterally.  Rectum: small external hemorrhoid noted.   Extremities: extremities normal, atraumatic, no cyanosis or edema Neurologic: Grossly normal   Assessment:   1. Yeast vaginitis   2. Type 2 diabetes mellitus without complication, without long-term current use of insulin (HCC)   3. Grade I hemorrhoids       Plan:   - I discussed with patient that likely due to her uncontrolled diabetes she is still experiencing yeast vaginitis symptoms despite use of several rounds of Diflucan.  I will add Terazol cream to her regimen, patient to use nightly for 7 days.  Can also utilize some of the medication towards the rectovaginal region. -Prescribed Anusol gel for pain management of hemorrhoids.   12/16/2013, MD [facility]

## 2021-09-16 ENCOUNTER — Other Ambulatory Visit: Payer: Self-pay | Admitting: Obstetrics and Gynecology

## 2021-09-26 ENCOUNTER — Other Ambulatory Visit: Payer: Self-pay | Admitting: Obstetrics and Gynecology

## 2021-09-26 ENCOUNTER — Encounter: Payer: 59 | Attending: Family Medicine

## 2021-09-26 ENCOUNTER — Telehealth: Payer: Self-pay | Admitting: *Deleted

## 2021-09-26 DIAGNOSIS — Z713 Dietary counseling and surveillance: Secondary | ICD-10-CM | POA: Insufficient documentation

## 2021-09-26 DIAGNOSIS — E669 Obesity, unspecified: Secondary | ICD-10-CM | POA: Insufficient documentation

## 2021-09-26 DIAGNOSIS — E119 Type 2 diabetes mellitus without complications: Secondary | ICD-10-CM | POA: Insufficient documentation

## 2021-09-26 DIAGNOSIS — Z6835 Body mass index (BMI) 35.0-35.9, adult: Secondary | ICD-10-CM | POA: Insufficient documentation

## 2021-09-26 NOTE — Telephone Encounter (Signed)
Pt didn't show For Diabetes Class 1. Left message to call back if she wants to reschedule.

## 2021-09-28 MED ORDER — TRAMADOL HCL 50 MG PO TABS: 100.0000 mg | ORAL_TABLET | Freq: Four times a day (QID) | ORAL | 0 refills | Status: DC | PRN

## 2021-09-29 ENCOUNTER — Encounter: Payer: Self-pay | Admitting: Obstetrics and Gynecology

## 2021-10-06 ENCOUNTER — Ambulatory Visit: Payer: 59

## 2021-10-10 ENCOUNTER — Ambulatory Visit: Payer: 59

## 2021-10-11 DIAGNOSIS — E1169 Type 2 diabetes mellitus with other specified complication: Secondary | ICD-10-CM | POA: Diagnosis not present

## 2021-10-11 DIAGNOSIS — Z794 Long term (current) use of insulin: Secondary | ICD-10-CM | POA: Diagnosis not present

## 2021-10-11 DIAGNOSIS — E1165 Type 2 diabetes mellitus with hyperglycemia: Secondary | ICD-10-CM | POA: Diagnosis not present

## 2021-10-11 DIAGNOSIS — I10 Essential (primary) hypertension: Secondary | ICD-10-CM | POA: Diagnosis not present

## 2021-10-11 DIAGNOSIS — E11649 Type 2 diabetes mellitus with hypoglycemia without coma: Secondary | ICD-10-CM | POA: Diagnosis not present

## 2021-10-11 DIAGNOSIS — E785 Hyperlipidemia, unspecified: Secondary | ICD-10-CM | POA: Diagnosis not present

## 2021-10-11 DIAGNOSIS — E8881 Metabolic syndrome: Secondary | ICD-10-CM | POA: Diagnosis not present

## 2021-10-14 ENCOUNTER — Encounter: Payer: Self-pay | Admitting: Obstetrics and Gynecology

## 2021-10-15 MED ORDER — OXYCODONE-ACETAMINOPHEN 5-325 MG PO TABS: 1.0000 | ORAL_TABLET | Freq: Four times a day (QID) | ORAL | 0 refills | Status: DC | PRN

## 2021-10-17 ENCOUNTER — Other Ambulatory Visit: Payer: Self-pay | Admitting: Obstetrics and Gynecology

## 2021-10-19 MED ORDER — OXYCODONE-ACETAMINOPHEN 5-325 MG PO TABS: 1.0000 | ORAL_TABLET | Freq: Four times a day (QID) | ORAL | 0 refills | Status: DC | PRN

## 2021-10-24 ENCOUNTER — Other Ambulatory Visit: Payer: Self-pay | Admitting: Obstetrics and Gynecology

## 2021-10-25 ENCOUNTER — Encounter: Payer: Self-pay | Admitting: *Deleted

## 2021-10-25 MED ORDER — TRAMADOL HCL 50 MG PO TABS: 100.0000 mg | ORAL_TABLET | Freq: Four times a day (QID) | ORAL | 0 refills | Status: DC | PRN

## 2021-10-26 DIAGNOSIS — E1165 Type 2 diabetes mellitus with hyperglycemia: Secondary | ICD-10-CM | POA: Diagnosis not present

## 2021-11-08 ENCOUNTER — Other Ambulatory Visit: Payer: Self-pay | Admitting: Obstetrics and Gynecology

## 2021-11-09 MED ORDER — OXYCODONE-ACETAMINOPHEN 5-325 MG PO TABS: 1.0000 | ORAL_TABLET | Freq: Four times a day (QID) | ORAL | 0 refills | Status: DC | PRN

## 2021-11-09 MED ORDER — DANAZOL 200 MG PO CAPS: 400.0000 mg | ORAL_CAPSULE | Freq: Two times a day (BID) | ORAL | 3 refills | Status: DC

## 2021-11-09 NOTE — Addendum Note (Signed)
Addended by: Fabian November on: 11/09/2021 05:26 PM   Modules accepted: Orders

## 2021-11-21 ENCOUNTER — Other Ambulatory Visit: Payer: Self-pay | Admitting: Obstetrics and Gynecology

## 2021-11-21 NOTE — Progress Notes (Deleted)
    GYNECOLOGY PROGRESS NOTE  Subjective:    Patient ID: [name], female    DOB: 1980/10/27, 41 y.o.   MRN: [id number]  HPI  Patient is a 41 y.o. G25P1102 female who presents for increased bilateral flank pain.  {Common ambulatory SmartLinks:19316}  Review of Systems {ros; complete:30496}   Objective:   Last menstrual period 10/15/2013. There is no height or weight on file to calculate BMI. General appearance: {general exam:16600} Abdomen: {abdominal exam:16834} Pelvic: {pelvic exam:16852::"cervix normal in appearance","external genitalia normal","no adnexal masses or tenderness","no cervical motion tenderness","rectovaginal septum normal","uterus normal size, shape, and consistency","vagina normal without discharge"} Extremities: {extremity exam:5109} Neurologic: {neuro exam:17854}   Assessment:   No diagnosis found.   Plan:   There are no diagnoses linked to this encounter.    Hildred Laser, MD [facility]

## 2021-11-22 ENCOUNTER — Encounter: Payer: 59 | Admitting: Obstetrics and Gynecology

## 2021-11-23 ENCOUNTER — Other Ambulatory Visit: Payer: Self-pay | Admitting: Obstetrics and Gynecology

## 2021-11-23 MED ORDER — TRAMADOL HCL 50 MG PO TABS: 100.0000 mg | ORAL_TABLET | Freq: Four times a day (QID) | ORAL | 0 refills | Status: DC | PRN

## 2021-12-05 NOTE — Progress Notes (Unsigned)
    GYNECOLOGY PROGRESS NOTE  Subjective:    Patient ID: [name], female    DOB: 15-Jan-1981, 41 y.o.   MRN: [id number]  HPI  Patient is a 41 y.o. G43P1102 female who presents for evaluation of pelvic pain. She reports today that pelvic pain is not as bad as it has been in the past (last few weeks has had more intense pain).  Does have a h/o endometriosis and chronic pelvic pain (s/p hysterectomy with BSO).  Currently using Danazol for suppression, initiated last month. She has started a new diabetes medication (Farxiga) and the side effects are discharge or yeast infections. She has noticed some vaginal discharge, but no discharge, itching or odor.   Does note some issues with mild constipation since being on Ozempic, goes ~ 3 x week.    The following portions of the patient's history were reviewed and updated as appropriate: allergies, current medications, past family history, past medical history, past social history, past surgical history, and problem list.  Review of Systems Pertinent items are noted in HPI.   Objective:   Blood pressure 105/74, pulse 78, resp. rate 16, height 5\' 5"  (1.651 m), weight 178 lb 11.2 oz (81.1 kg), last menstrual period 10/15/2013. Body mass index is 29.74 kg/m.  General appearance: alert, cooperative, and no distress Abdomen: soft, non-tender; bowel sounds normal; no masses,  no organomegaly Pelvic: pelvic exam deferred.  Patient allowed to collect self-swab.    Assessment:   1. Vaginal discharge   2. Pelvic pain   3. History of endometriosis   4. Other constipation      Plan:   1) Patient with acute on chronic pelvic pain, resolving.  Uses Tramadol for chronic pain, required stronger pain medication (Percocet) for acute pain.  Currently also now taking Danazol to help with possible residual endometriosis suppression.   2) Vaginal discharge, patient allowed to collect self-swab. Will notify of results via Mychart.  3) Discussed  constipation can also lead to exacerbation of pain.  Advised on use of stool softeners.     12/16/2013, MD [facility]

## 2021-12-06 ENCOUNTER — Ambulatory Visit (INDEPENDENT_AMBULATORY_CARE_PROVIDER_SITE_OTHER): Payer: 59 | Admitting: Obstetrics and Gynecology

## 2021-12-06 ENCOUNTER — Encounter: Payer: Self-pay | Admitting: Obstetrics and Gynecology

## 2021-12-06 ENCOUNTER — Other Ambulatory Visit (HOSPITAL_COMMUNITY)
Admission: RE | Admit: 2021-12-06 | Discharge: 2021-12-06 | Disposition: A | Discharge status: Home / Self Care | Payer: 59 | Source: Ambulatory Visit | Attending: Obstetrics and Gynecology | Admitting: Obstetrics and Gynecology

## 2021-12-06 VITALS — BP 105/74 | HR 78 | Resp 16 | Ht 65.0 in | Wt 178.7 lb

## 2021-12-06 DIAGNOSIS — Z8742 Personal history of other diseases of the female genital tract: Secondary | ICD-10-CM

## 2021-12-06 DIAGNOSIS — E1165 Type 2 diabetes mellitus with hyperglycemia: Secondary | ICD-10-CM | POA: Diagnosis not present

## 2021-12-06 DIAGNOSIS — N898 Other specified noninflammatory disorders of vagina: Secondary | ICD-10-CM | POA: Insufficient documentation

## 2021-12-06 DIAGNOSIS — R102 Pelvic and perineal pain: Secondary | ICD-10-CM | POA: Diagnosis not present

## 2021-12-06 DIAGNOSIS — I10 Essential (primary) hypertension: Secondary | ICD-10-CM | POA: Diagnosis not present

## 2021-12-06 DIAGNOSIS — K5909 Other constipation: Secondary | ICD-10-CM

## 2021-12-07 ENCOUNTER — Encounter: Payer: Self-pay | Admitting: Obstetrics and Gynecology

## 2021-12-08 LAB — CERVICOVAGINAL ANCILLARY ONLY
Bacterial Vaginitis (gardnerella): NEGATIVE
Candida Glabrata: NEGATIVE
Candida Vaginitis: NEGATIVE
Comment: NEGATIVE
Comment: NEGATIVE
Comment: NEGATIVE

## 2021-12-08 MED ORDER — OXYCODONE-ACETAMINOPHEN 5-325 MG PO TABS: 1.0000 | ORAL_TABLET | Freq: Four times a day (QID) | ORAL | 0 refills | Status: DC | PRN

## 2021-12-15 ENCOUNTER — Other Ambulatory Visit: Payer: Self-pay | Admitting: Obstetrics and Gynecology

## 2021-12-15 DIAGNOSIS — E1169 Type 2 diabetes mellitus with other specified complication: Secondary | ICD-10-CM | POA: Diagnosis not present

## 2021-12-15 DIAGNOSIS — E785 Hyperlipidemia, unspecified: Secondary | ICD-10-CM | POA: Diagnosis not present

## 2021-12-15 DIAGNOSIS — Z794 Long term (current) use of insulin: Secondary | ICD-10-CM | POA: Diagnosis not present

## 2021-12-15 DIAGNOSIS — E1165 Type 2 diabetes mellitus with hyperglycemia: Secondary | ICD-10-CM | POA: Diagnosis not present

## 2021-12-15 DIAGNOSIS — I10 Essential (primary) hypertension: Secondary | ICD-10-CM | POA: Diagnosis not present

## 2021-12-15 DIAGNOSIS — E11649 Type 2 diabetes mellitus with hypoglycemia without coma: Secondary | ICD-10-CM | POA: Diagnosis not present

## 2021-12-16 MED ORDER — OXYCODONE-ACETAMINOPHEN 5-325 MG PO TABS
1.0000 | ORAL_TABLET | Freq: Four times a day (QID) | ORAL | 0 refills | Status: DC | PRN
Start: 2021-12-16 — End: 2022-01-05

## 2021-12-21 ENCOUNTER — Other Ambulatory Visit: Payer: Self-pay | Admitting: Obstetrics and Gynecology

## 2021-12-22 MED ORDER — TRAMADOL HCL 50 MG PO TABS: 100.0000 mg | ORAL_TABLET | Freq: Four times a day (QID) | ORAL | 0 refills | Status: DC | PRN

## 2022-01-05 ENCOUNTER — Other Ambulatory Visit: Payer: Self-pay | Admitting: Obstetrics and Gynecology

## 2022-01-09 ENCOUNTER — Other Ambulatory Visit: Payer: Self-pay | Admitting: Obstetrics and Gynecology

## 2022-01-09 MED ORDER — OXYCODONE-ACETAMINOPHEN 5-325 MG PO TABS: 1.0000 | ORAL_TABLET | Freq: Four times a day (QID) | ORAL | 0 refills | Status: DC | PRN

## 2022-01-18 ENCOUNTER — Other Ambulatory Visit: Payer: Self-pay | Admitting: Obstetrics and Gynecology

## 2022-01-20 ENCOUNTER — Other Ambulatory Visit: Payer: Self-pay | Admitting: Obstetrics and Gynecology

## 2022-01-20 MED ORDER — TRAMADOL HCL 50 MG PO TABS: 100.0000 mg | ORAL_TABLET | Freq: Four times a day (QID) | ORAL | 0 refills | Status: DC | PRN

## 2022-02-02 ENCOUNTER — Other Ambulatory Visit: Payer: Self-pay

## 2022-02-02 DIAGNOSIS — Z1152 Encounter for screening for COVID-19: Secondary | ICD-10-CM

## 2022-02-02 LAB — POC COVID19 BINAXNOW: SARS Coronavirus 2 Ag: NEGATIVE

## 2022-02-02 NOTE — Progress Notes (Signed)
Pt presents today for covid testing due to daughter testing positive two days ago. She states she started having some cold symptoms this morning.

## 2022-02-07 ENCOUNTER — Other Ambulatory Visit: Payer: Self-pay | Admitting: Obstetrics and Gynecology

## 2022-02-09 ENCOUNTER — Other Ambulatory Visit: Payer: Self-pay | Admitting: Obstetrics and Gynecology

## 2022-02-10 ENCOUNTER — Other Ambulatory Visit: Payer: Self-pay | Admitting: Obstetrics and Gynecology

## 2022-02-13 ENCOUNTER — Encounter: Payer: Self-pay | Admitting: Obstetrics and Gynecology

## 2022-02-13 ENCOUNTER — Other Ambulatory Visit: Payer: Self-pay | Admitting: Obstetrics and Gynecology

## 2022-02-13 MED ORDER — OXYCODONE-ACETAMINOPHEN 5-325 MG PO TABS: 1.0000 | ORAL_TABLET | Freq: Four times a day (QID) | ORAL | 0 refills | Status: DC | PRN

## 2022-02-19 ENCOUNTER — Other Ambulatory Visit: Payer: Self-pay | Admitting: Obstetrics and Gynecology

## 2022-02-21 MED ORDER — TRAMADOL HCL 50 MG PO TABS: 100.0000 mg | ORAL_TABLET | Freq: Four times a day (QID) | ORAL | 0 refills | Status: DC | PRN

## 2022-03-03 MED ORDER — TRAMADOL HCL 50 MG PO TABS: 100.0000 mg | ORAL_TABLET | Freq: Four times a day (QID) | ORAL | 0 refills | Status: DC | PRN

## 2022-03-04 ENCOUNTER — Other Ambulatory Visit: Payer: Self-pay

## 2022-03-16 ENCOUNTER — Other Ambulatory Visit: Payer: Self-pay | Admitting: Physician Assistant

## 2022-03-16 ENCOUNTER — Encounter: Payer: Self-pay | Admitting: Physician Assistant

## 2022-03-16 MED ORDER — HYDROCOD POLI-CHLORPHE POLI ER 10-8 MG/5ML PO SUER: 5.0000 mL | Freq: Two times a day (BID) | ORAL | 0 refills | Status: DC | PRN

## 2022-03-22 ENCOUNTER — Other Ambulatory Visit: Payer: Self-pay | Admitting: Obstetrics and Gynecology

## 2022-03-23 MED ORDER — TRAMADOL HCL 50 MG PO TABS: 100.0000 mg | ORAL_TABLET | Freq: Four times a day (QID) | ORAL | 0 refills | Status: DC | PRN

## 2022-03-30 ENCOUNTER — Telehealth: Payer: Self-pay

## 2022-03-30 NOTE — Telephone Encounter (Signed)
I received a prior authorization for her tramadol 50 mg. It was denied. When I called insurance to see why it was being denied. No prior authorization is required. They do cover the medication, as long as she is not requesting medication early.  FYI

## 2022-03-30 NOTE — Telephone Encounter (Signed)
Ok, sounds good.

## 2022-04-04 ENCOUNTER — Other Ambulatory Visit: Payer: Self-pay | Admitting: Obstetrics and Gynecology

## 2022-04-06 ENCOUNTER — Other Ambulatory Visit: Payer: Self-pay | Admitting: Physician Assistant

## 2022-04-06 ENCOUNTER — Encounter: Payer: Self-pay | Admitting: Physician Assistant

## 2022-04-06 DIAGNOSIS — R051 Acute cough: Secondary | ICD-10-CM

## 2022-04-06 MED ORDER — TRAMADOL HCL 50 MG PO TABS: 100.0000 mg | ORAL_TABLET | Freq: Four times a day (QID) | ORAL | 0 refills | Status: DC | PRN

## 2022-04-06 MED ORDER — HYDROCODONE BIT-HOMATROP MBR 5-1.5 MG/5ML PO SOLN: 5.0000 mL | Freq: Three times a day (TID) | ORAL | 0 refills | Status: DC | PRN

## 2022-04-06 MED ORDER — HYDROCOD POLI-CHLORPHE POLI ER 10-8 MG/5ML PO SUER: 5.0000 mL | Freq: Two times a day (BID) | ORAL | 0 refills | Status: DC | PRN

## 2022-04-18 ENCOUNTER — Other Ambulatory Visit: Payer: Self-pay | Admitting: Physician Assistant

## 2022-04-18 MED ORDER — HYDROCODONE BIT-HOMATROP MBR 5-1.5 MG/5ML PO SOLN: 5.0000 mL | Freq: Three times a day (TID) | ORAL | 0 refills | Status: DC | PRN

## 2022-04-20 ENCOUNTER — Other Ambulatory Visit: Payer: Self-pay | Admitting: Obstetrics and Gynecology

## 2022-04-21 ENCOUNTER — Other Ambulatory Visit: Payer: Self-pay | Admitting: Family Medicine

## 2022-04-21 DIAGNOSIS — Z1231 Encounter for screening mammogram for malignant neoplasm of breast: Secondary | ICD-10-CM

## 2022-04-26 ENCOUNTER — Other Ambulatory Visit: Payer: Self-pay | Admitting: Obstetrics and Gynecology

## 2022-04-27 ENCOUNTER — Other Ambulatory Visit: Payer: Self-pay | Admitting: Obstetrics and Gynecology

## 2022-04-27 MED ORDER — TRAMADOL HCL 50 MG PO TABS: 100.0000 mg | ORAL_TABLET | Freq: Four times a day (QID) | ORAL | 0 refills | Status: DC | PRN

## 2022-05-09 DIAGNOSIS — E11649 Type 2 diabetes mellitus with hypoglycemia without coma: Secondary | ICD-10-CM | POA: Diagnosis not present

## 2022-05-09 DIAGNOSIS — Z794 Long term (current) use of insulin: Secondary | ICD-10-CM | POA: Diagnosis not present

## 2022-05-09 DIAGNOSIS — I10 Essential (primary) hypertension: Secondary | ICD-10-CM | POA: Diagnosis not present

## 2022-05-09 DIAGNOSIS — E1165 Type 2 diabetes mellitus with hyperglycemia: Secondary | ICD-10-CM | POA: Diagnosis not present

## 2022-05-09 DIAGNOSIS — E1169 Type 2 diabetes mellitus with other specified complication: Secondary | ICD-10-CM | POA: Diagnosis not present

## 2022-05-09 DIAGNOSIS — E785 Hyperlipidemia, unspecified: Secondary | ICD-10-CM | POA: Diagnosis not present

## 2022-05-11 ENCOUNTER — Encounter: Payer: Self-pay | Admitting: Obstetrics and Gynecology

## 2022-05-11 ENCOUNTER — Other Ambulatory Visit: Payer: Self-pay | Admitting: Obstetrics and Gynecology

## 2022-05-12 ENCOUNTER — Ambulatory Visit
Admission: RE | Admit: 2022-05-12 | Discharge: 2022-05-12 | Disposition: A | Discharge status: Home / Self Care | Payer: 59 | Source: Ambulatory Visit | Attending: Family Medicine | Admitting: Family Medicine

## 2022-05-12 DIAGNOSIS — Z1231 Encounter for screening mammogram for malignant neoplasm of breast: Secondary | ICD-10-CM | POA: Diagnosis not present

## 2022-05-12 MED ORDER — TRAMADOL HCL 50 MG PO TABS: 100.0000 mg | ORAL_TABLET | Freq: Four times a day (QID) | ORAL | 0 refills | Status: DC | PRN

## 2022-05-25 ENCOUNTER — Other Ambulatory Visit: Payer: Self-pay | Admitting: Obstetrics and Gynecology

## 2022-05-26 MED ORDER — TRAMADOL HCL 50 MG PO TABS: 100.0000 mg | ORAL_TABLET | Freq: Four times a day (QID) | ORAL | 0 refills | Status: DC | PRN

## 2022-05-26 MED ORDER — ORILISSA 200 MG PO TABS: 200.0000 mg | ORAL_TABLET | Freq: Two times a day (BID) | ORAL | 5 refills | Status: DC

## 2022-06-07 ENCOUNTER — Other Ambulatory Visit: Payer: Self-pay | Admitting: Obstetrics and Gynecology

## 2022-06-08 ENCOUNTER — Other Ambulatory Visit: Payer: Self-pay | Admitting: Obstetrics and Gynecology

## 2022-06-09 ENCOUNTER — Telehealth: Payer: Self-pay

## 2022-06-09 MED ORDER — TRAMADOL HCL 50 MG PO TABS: 100.0000 mg | ORAL_TABLET | Freq: Four times a day (QID) | ORAL | 0 refills | Status: DC | PRN

## 2022-06-09 NOTE — Telephone Encounter (Signed)
Pt states he pharmacy is having trouble receiving electronic Rx, She wanted to know if we could call and give a verbal. [facility] and left the RX details on the voicemail.

## 2022-06-15 ENCOUNTER — Encounter: Payer: Self-pay | Admitting: Obstetrics and Gynecology

## 2022-06-22 ENCOUNTER — Other Ambulatory Visit: Payer: Self-pay | Admitting: Obstetrics and Gynecology

## 2022-06-25 MED ORDER — TRAMADOL HCL 50 MG PO TABS: 100.0000 mg | ORAL_TABLET | Freq: Four times a day (QID) | ORAL | 0 refills | Status: DC | PRN

## 2022-06-30 ENCOUNTER — Ambulatory Visit (INDEPENDENT_AMBULATORY_CARE_PROVIDER_SITE_OTHER): Payer: 59

## 2022-06-30 ENCOUNTER — Ambulatory Visit
Admission: EM | Admit: 2022-06-30 | Discharge: 2022-06-30 | Disposition: A | Discharge status: Home / Self Care | Payer: 59

## 2022-06-30 DIAGNOSIS — Z882 Allergy status to sulfonamides status: Secondary | ICD-10-CM | POA: Diagnosis not present

## 2022-06-30 DIAGNOSIS — Z881 Allergy status to other antibiotic agents status: Secondary | ICD-10-CM | POA: Diagnosis not present

## 2022-06-30 DIAGNOSIS — K56609 Unspecified intestinal obstruction, unspecified as to partial versus complete obstruction: Secondary | ICD-10-CM | POA: Diagnosis not present

## 2022-06-30 DIAGNOSIS — K5900 Constipation, unspecified: Secondary | ICD-10-CM

## 2022-06-30 DIAGNOSIS — R9341 Abnormal radiologic findings on diagnostic imaging of renal pelvis, ureter, or bladder: Secondary | ICD-10-CM | POA: Diagnosis not present

## 2022-06-30 DIAGNOSIS — K5641 Fecal impaction: Secondary | ICD-10-CM | POA: Diagnosis not present

## 2022-06-30 DIAGNOSIS — K529 Noninfective gastroenteritis and colitis, unspecified: Secondary | ICD-10-CM | POA: Diagnosis not present

## 2022-06-30 DIAGNOSIS — Z79890 Hormone replacement therapy (postmenopausal): Secondary | ICD-10-CM | POA: Diagnosis not present

## 2022-06-30 DIAGNOSIS — R000 Tachycardia, unspecified: Secondary | ICD-10-CM | POA: Diagnosis not present

## 2022-06-30 DIAGNOSIS — K6289 Other specified diseases of anus and rectum: Secondary | ICD-10-CM | POA: Diagnosis not present

## 2022-06-30 DIAGNOSIS — E119 Type 2 diabetes mellitus without complications: Secondary | ICD-10-CM | POA: Diagnosis not present

## 2022-06-30 DIAGNOSIS — K625 Hemorrhage of anus and rectum: Secondary | ICD-10-CM | POA: Diagnosis not present

## 2022-06-30 DIAGNOSIS — I10 Essential (primary) hypertension: Secondary | ICD-10-CM | POA: Diagnosis not present

## 2022-06-30 DIAGNOSIS — Z880 Allergy status to penicillin: Secondary | ICD-10-CM | POA: Diagnosis not present

## 2022-06-30 DIAGNOSIS — Z79899 Other long term (current) drug therapy: Secondary | ICD-10-CM | POA: Diagnosis not present

## 2022-06-30 DIAGNOSIS — R110 Nausea: Secondary | ICD-10-CM | POA: Diagnosis not present

## 2022-06-30 NOTE — ED Provider Notes (Signed)
[facility]    CSN: [id number] Arrival date & time: 06/30/22  1644      History   Chief Complaint Chief Complaint  Patient presents with   Abdominal Pain   Constipation    HPI [name] is a 42 y.o. female.   HPI  Patient 42 y.o. female presents with constipation.  She has a history of type 1 diabetes and is on continuous insulin therapy.  She reports that she has been on the commode since 1 PM today trying to have a bowel movement.  She has had several episodes when she has tried to manually impact her stool.  This caused significant bleeding.  She reports that she is having difficulty sitting with increased pressure.  She has used stool softener.  She denies any laxative use.  She does drink a considerable amount of apple juice when her blood glucose drops.  She denies adequate water intake.  She has started on new insulin therapy Mounjaro.  Past Medical History:  Diagnosis Date   Abnormal Pap smear    Anxiety    Diabetes mellitus without complication ([place])    Endometriosis    Family history of hemochromatosis    mother    HA (headache)    otc med prn   Hyperlipidemia    diet controlled, no meds   Hypertension    PONV (postoperative nausea and vomiting)    Pregnancy induced hypertension 2008    Patient Active Problem List   Diagnosis Date Noted   Dyshidrotic eczema 12/17/2018   Subclinical hypothyroidism 12/17/2018   Subungual hematoma of toe 12/17/2018   Class 2 severe obesity due to excess calories with serious comorbidity and body mass index (BMI) of 37.0 to 37.9 in adult [facility]) 11/27/2018   Family history of hemochromatosis 11/27/2018   Multiple drug allergies 11/27/2018   Osteoarthritis of spine with radiculopathy, lumbar region 06/20/2016   H/O abdominal surgery 02/09/2016   Chronic pelvic pain in female 11/30/2014   Endometriosis 11/30/2014   Hypertension 09/01/2014   Cyst of ovary 09/01/2014   Class 2 obesity 09/01/2014   Anxiety  state 12/11/2013   Routine general medical examination at a health care facility [id number]   Metabolic syndrome [id number]   Insomnia 12/20/2007   ALLERGIC RHINITIS 07/18/2007   Other hyperlipidemia 02/19/2007    Past Surgical History:  Procedure Laterality Date   ABDOMINAL HYSTERECTOMY     ABLATION ON ENDOMETRIOSIS N/A 06/04/2014   Procedure: ABLATION ON ENDOMETRIOSIS;  Surgeon: Allena Katz, MD;  Location: [facility];  Service: Gynecology;  Laterality: N/A;   BIOPSY  12/01/2011   Procedure: BIOPSY;  Surgeon: Allena Katz, MD;  Location: [facility];  Service: Gynecology;  Laterality: N/A;   CESAREAN SECTION  2008   CESAREAN SECTION WITH BILATERAL TUBAL LIGATION N/A 12/02/2012   Procedure: Repeat Cesarean Section Delivery Baby Girl @ [place], Apgars 8/9, Bilateral Tubal Ligation;  Surgeon: Allena Katz, MD;  Location: [facility];  Service: Obstetrics;  Laterality: N/A;   COLPOSCOPY     as teenager, results WNL   ENDOMETRIAL BIOPSY     LAPAROSCOPIC ASSISTED VAGINAL HYSTERECTOMY  2012   with bilateral salpingectomy   LAPAROSCOPIC BILATERAL SALPINGECTOMY Bilateral 02/15/2015   Procedure: LAPAROSCOPIC BILATERAL SALPINGECTOMY;  Surgeon: Rubie Maid, MD;  Location: [facility];  Service: Gynecology;  Laterality: Bilateral;   LAPAROSCOPIC LYSIS OF ADHESIONS N/A 06/04/2014   Procedure: LAPAROSCOPIC LYSIS OF ADHESIONS;  Surgeon: Allena Katz, MD;  Location:  [facility];  Service: Gynecology;  Laterality: N/A;   LAPAROSCOPIC SALPINGO OOPHERECTOMY Right 07/17/2016   Procedure: LAPAROSCOPIC RIGHT SALPINGO OOPHORECTOMY;  Surgeon: Rubie Maid, MD;  Location: [facility];  Service: Gynecology;  Laterality: Right;   LAPAROSCOPY  12/01/2011   Procedure: LAPAROSCOPY OPERATIVE;  Surgeon: Allena Katz, MD;  Location: [facility];  Service: Gynecology;  Laterality: N/A;   LAPAROSCOPY N/A 06/04/2014   Procedure: LAPAROSCOPY OPERATIVE;  Surgeon: Allena Katz, MD;  Location: [facility];  Service: Gynecology;   Laterality: N/A;   LAPAROSCOPY N/A 02/14/2016   Procedure: LAPAROSCOPY DIAGNOSTIC WITH BIOPSIES POSSIBLE ADHESIOLYSIS;  Surgeon: Rubie Maid, MD;  Location: [facility];  Service: Gynecology;  Laterality: N/A;   LAPAROSCOPY N/A 05/26/2016   Procedure: LAPAROSCOPY operative with lysis of adhesions;  Surgeon: Harlin Heys, MD;  Location: [facility];  Service: Gynecology;  Laterality: N/A;   OOPHORECTOMY Left    SALPINGECTOMY Bilateral    TONSILLECTOMY     TUBAL LIGATION     TYMPANOSTOMY TUBE PLACEMENT      OB History     Gravida  2   Para  2   Term  1   Preterm  1   AB      Living  2      SAB      IAB      Ectopic      Multiple      Live Births  2            Home Medications    Prior to Admission medications   Medication Sig Start Date End Date Taking? Authorizing Provider  MOUNJARO 7.5 MG/0.5ML Pen Inject into the skin. 04/14/22  Yes [provider]  amLODipine (NORVASC) 10 MG tablet Take 1 tablet by mouth daily. 05/03/21   [provider]  atorvastatin (LIPITOR) 20 MG tablet Take 20 mg by mouth daily. 06/29/21   [provider]  blood glucose meter kit and supplies KIT Dispense based on patient and insurance preference. Use up to four times daily as directed. 08/28/21   Lucrezia Starch, MD  danazol (DANOCRINE) 200 MG capsule Take 2 capsules (400 mg total) by mouth 2 (two) times daily. 11/09/21   Rubie Maid, MD  Elagolix Sodium (ORILISSA) 200 MG TABS Take 1 tablet (200 mg total) by mouth in the morning and at bedtime. 05/26/22   Rubie Maid, MD  estradiol (ESTRACE) 1 MG tablet TAKE ONE TABLET BY MOUTH EVERY DAY 09/18/21   Rubie Maid, MD  gabapentin (NEURONTIN) 600 MG tablet TAKE 1 TABLET BY MOUTH THREE TIMES DAILY 04/24/22   Rubie Maid, MD  gemfibrozil (LOPID) 600 MG tablet Take 1 tablet by mouth in the morning and at bedtime. 07/16/20 08/03/21  [provider]  hydrochlorothiazide (HYDRODIURIL) 25 MG tablet TAKE 1 TABLET BY  MOUTH DAILY. TAKE WITH LOSARTAN. 04/29/21   [provider]  Insulin Glargine (BASAGLAR KWIKPEN) 100 UNIT/ML Inject 20 Units into the skin daily.    [provider]  losartan (COZAAR) 100 MG tablet Take 100 mg by mouth daily. 04/20/21   [provider]  metFORMIN (GLUCOPHAGE-XR) 750 MG 24 hr tablet Take 750 mg by mouth 2 (two) times daily. 06/09/21   [provider]  metoprolol succinate (TOPROL-XL) 50 MG 24 hr tablet Take 1 tablet (50 mg total) by mouth daily. Take with or immediately following a meal. 10/24/19   Sable Feil, PA-C  ondansetron (ZOFRAN-ODT) 4 MG disintegrating tablet Take 4 mg  by mouth every 8 (eight) hours as needed. 08/12/21   [provider]  traMADol (ULTRAM) 50 MG tablet Take 2 tablets (100 mg total) by mouth every 6 (six) hours as needed for moderate pain or severe pain. 06/25/22   Rubie Maid, MD    Family History Family History  Problem Relation Age of Onset   Hypertension Mother    Hemochromatosis Mother    Diabetes Father    Hypertension Father    Breast cancer Paternal Aunt    Coronary artery disease Paternal Uncle    Ovarian cancer Maternal Grandmother    Lung cancer Maternal Grandfather    Brain cancer Maternal Grandfather    CVA Paternal Grandmother    Heart attack Paternal Grandmother    Kidney disease Paternal Grandfather    Cancer Paternal Grandfather     Social History Social History   Tobacco Use   Smoking status: Never   Smokeless tobacco: Never  Vaping Use   Vaping Use: Never used  Substance Use Topics   Alcohol use: Yes    Comment: occasional   Drug use: No     Allergies   Sulfamethoxazole-trimethoprim, Amoxil [amoxicillin], Azithromycin, Cefprozil, Ceftin [cefuroxime axetil], Other, Penicillins, and Sulfa antibiotics   Review of Systems Review of Systems   Physical Exam Triage Vital Signs ED Triage Vitals  Enc Vitals Group     BP      Pulse      Resp      Temp      Temp src       SpO2      Weight      Height      Head Circumference      Peak Flow      Pain Score      Pain Loc      Pain Edu?      Excl. in [place]?    No data found.  Updated Vital Signs BP 108/80 (BP Location: Left Arm)   Pulse 90   Temp 99 F (37.2 C) (Oral)   Ht 5\' 5"  (1.651 m)   Wt 148 lb (67.1 kg)   LMP 10/15/2013   SpO2 100%   BMI 24.63 kg/m   Visual Acuity Right Eye Distance:   Left Eye Distance:   Bilateral Distance:    Right Eye Near:   Left Eye Near:    Bilateral Near:     Physical Exam Constitutional:      Appearance: She is normal weight.  HENT:     Head: Normocephalic.  Cardiovascular:     Rate and Rhythm: Normal rate.  Pulmonary:     Effort: Pulmonary effort is normal.  Skin:    General: Skin is warm and dry.     Capillary Refill: Capillary refill takes less than 2 seconds.  Neurological:     Mental Status: She is alert and oriented to person, place, and time.      UC Treatments / Results  Labs (all labs ordered are listed, but only abnormal results are displayed) Labs Reviewed - No data to display  EKG   Radiology DG Abd 2 Views  Result Date: 06/30/2022 CLINICAL DATA:  Constipation and rectal bleeding. EXAM: ABDOMEN - 2 VIEW COMPARISON:  None Available. FINDINGS: No gaseous small bowel dilatation to suggest obstruction. Prominent stool volume noted throughout the colon with a large amount of stool seen in the rectum. Electronic radiopaque foreign body projects over the right iliac bone on 1 projection, not seen on  the upright film. SI joints and symphysis pubis unremarkable. IMPRESSION: Prominent stool volume throughout the colon with a large amount of stool in the rectum. Imaging features compatible with clinical constipation or fecal impaction. Electronic foreign body projects over the right iliac bone on the supine view of the pelvis but is not visible on the upright image which includes the same area and the foreign body is presumably external to the  patient. Electronically Signed   By: Misty Stanley M.D.   On: 06/30/2022 18:01    Procedures Procedures (including critical care time)  Medications Ordered in UC Medications - No data to display  Initial Impression / Assessment and Plan / UC Course  I have reviewed the triage vital signs and the nursing notes.  Pertinent labs & imaging results that were available during my care of the patient were reviewed by me and considered in my medical decision making (see chart for details).     constipation Final Clinical Impressions(s) / UC Diagnoses   Final diagnoses:  Constipation, unspecified constipation type  Intestinal obstruction, unspecified cause, unspecified whether partial or complete [facility])     Discharge Instructions      Your x-ray:Prominent stool volume throughout the colon with a large amount of stool in the rectum. Imaging features compatible with clinical constipation or fecal impaction.  You can try over the fleets enema as directed. If this is not successful you will have to go the hospital for additional procedures.   Recommendation is regular treatment to prevent constipation.      ED Prescriptions   None    PDMP not reviewed this encounter.   Dionisio David [place], NP 06/30/22 1902

## 2022-06-30 NOTE — ED Triage Notes (Addendum)
Pt is with her mom  Pt c/o abdominal pain, Bowel urgency, constipation, bright red rectal bleeding x1day  Pt last bowel movement was Monday and it was not complete.   Pt drinks a lot of applejuice but does not remember eating a varied diet.   Pt states that she can not get her stool to come out on her own.   Pt states that she has an appetite.   Pt states that she reached inside and pulled out stool.   Pt has taken stool softeners for a couple of days   Pt has begun taking mounjaro

## 2022-06-30 NOTE — Discharge Instructions (Addendum)
Your x-ray:Prominent stool volume throughout the colon with a large amount of stool in the rectum. Imaging features compatible with clinical constipation or fecal impaction.  You can try over the fleets enema as directed. If this is not successful you will have to go the hospital for additional procedures.   Recommendation is regular treatment to prevent constipation.

## 2022-07-01 DIAGNOSIS — K6289 Other specified diseases of anus and rectum: Secondary | ICD-10-CM | POA: Diagnosis not present

## 2022-07-01 DIAGNOSIS — R9341 Abnormal radiologic findings on diagnostic imaging of renal pelvis, ureter, or bladder: Secondary | ICD-10-CM | POA: Diagnosis not present

## 2022-07-08 ENCOUNTER — Other Ambulatory Visit: Payer: Self-pay | Admitting: Obstetrics and Gynecology

## 2022-07-10 ENCOUNTER — Other Ambulatory Visit: Payer: Self-pay | Admitting: Obstetrics and Gynecology

## 2022-07-10 ENCOUNTER — Encounter: Payer: Self-pay | Admitting: Physician Assistant

## 2022-07-10 ENCOUNTER — Other Ambulatory Visit: Payer: Self-pay | Admitting: Physician Assistant

## 2022-07-10 ENCOUNTER — Other Ambulatory Visit: Payer: Self-pay

## 2022-07-10 MED ORDER — MOUNJARO 10 MG/0.5ML ~~LOC~~ SOAJ
10.0000 mg | SUBCUTANEOUS | 5 refills | Status: DC
  Filled 2022-07-10: qty 2, 28d supply, fill #0
  Filled 2022-08-07: qty 2, 28d supply, fill #1
  Filled 2022-09-04: qty 2, 28d supply, fill #2
  Filled 2022-09-28: qty 2, 28d supply, fill #3

## 2022-07-10 MED ORDER — PROMETHAZINE-DM 6.25-15 MG/5ML PO SYRP: 5.0000 mL | ORAL_SOLUTION | Freq: Four times a day (QID) | ORAL | 0 refills | Status: DC | PRN

## 2022-07-10 MED ORDER — METHYLPREDNISOLONE 4 MG PO TBPK: ORAL_TABLET | ORAL | 0 refills | Status: DC

## 2022-07-10 MED ORDER — TRAMADOL HCL 50 MG PO TABS: 100.0000 mg | ORAL_TABLET | Freq: Four times a day (QID) | ORAL | 0 refills | Status: DC | PRN

## 2022-07-10 MED ORDER — BENZONATATE 200 MG PO CAPS: 200.0000 mg | ORAL_CAPSULE | Freq: Three times a day (TID) | ORAL | 0 refills | Status: DC | PRN

## 2022-07-12 ENCOUNTER — Ambulatory Visit (INDEPENDENT_AMBULATORY_CARE_PROVIDER_SITE_OTHER): Payer: 59 | Admitting: Obstetrics and Gynecology

## 2022-07-12 ENCOUNTER — Encounter: Payer: Self-pay | Admitting: Obstetrics and Gynecology

## 2022-07-12 VITALS — BP 99/70 | HR 85 | Resp 16 | Ht 65.0 in | Wt 159.3 lb

## 2022-07-12 DIAGNOSIS — R102 Pelvic and perineal pain: Secondary | ICD-10-CM

## 2022-07-12 DIAGNOSIS — Z7985 Long-term (current) use of injectable non-insulin antidiabetic drugs: Secondary | ICD-10-CM

## 2022-07-12 DIAGNOSIS — Z01818 Encounter for other preprocedural examination: Secondary | ICD-10-CM | POA: Diagnosis not present

## 2022-07-12 DIAGNOSIS — E119 Type 2 diabetes mellitus without complications: Secondary | ICD-10-CM

## 2022-07-12 DIAGNOSIS — Z8742 Personal history of other diseases of the female genital tract: Secondary | ICD-10-CM

## 2022-07-12 DIAGNOSIS — I159 Secondary hypertension, unspecified: Secondary | ICD-10-CM

## 2022-07-12 NOTE — Progress Notes (Signed)
GYNECOLOGY PROGRESS NOTE  Subjective:    Patient ID: [name], female    DOB: 1980-07-15, 42 y.o.   MRN: [id number]  HPI  Patient is a 42 y.o. G99P1102 female who presents for consultation for surgery. She is interested in having laparoscopic surgery for endometriosis. She has a prior history of hysterectomy with BSO. She reports that she has had this surgery several times before. Last surgery was in 2022 (performed at [facility]). Has been having worsening pelvic pain over the past 6 months, requiring use of occasional Oxycodone on top of her usual Tramadol dosing. Recently started on [place] 200 mg due to complaints of pelvic pain and dyspareunia.  Was supposed to discontinue the Danazol but notes that she did not realize this, and still has been taking it along with her Freida Busman.   The following portions of the patient's history were reviewed and updated as appropriate: allergies, current medications, past family history, past medical history, past social history, past surgical history, and problem list.  Review of Systems Pertinent items noted in HPI and remainder of comprehensive ROS otherwise negative.   Objective:   Blood pressure 99/70, pulse 85, resp. rate 16, height 5\' 5"  (1.651 m), weight 159 lb 4.8 oz (72.3 kg), last menstrual period 10/15/2013.  Body mass index is 26.51 kg/m. General appearance: alert, cooperative, and no distress Abdomen: soft, non-tender; bowel sounds normal; no masses,  no organomegaly Pelvic: deferred to OR Extremities: extremities normal, atraumatic, no cyanosis or edema Neurologic: Grossly normal   Assessment:   1. Pelvic pain   2. History of endometriosis   3. Type 2 diabetes mellitus without complication, without long-term current use of insulin ([place])   4. Secondary hypertension      Plan:   - Patient desires surgical management with laparoscopic excision of endometriosis.  The risks of surgery were discussed in detail with the patient  including but not limited to: bleeding which may require transfusion or reoperation; infection which may require prolonged hospitalization or re-hospitalization and antibiotic therapy; injury to bowel, bladder, ureters and major vessels or other surrounding organs which may lead to other procedures; formation of adhesions; need for additional procedures including laparotomy or subsequent procedures secondary to intraoperative injury or abnormal pathology; thromboembolic phenomenon; incisional problems and other postoperative or anesthesia complications.  Patient was told that the likelihood that her condition and symptoms will be treated effectively with this surgical management was good; the postoperative expectations were also discussed in detail. The patient also understands the alternative treatment options which were discussed in full. All questions were answered.  She was told that she will be contacted by our surgical scheduler regarding the time and date of her surgery; routine preoperative instructions will be given to her by the preoperative nursing team.  Patient education handouts about the procedure were given to the patient to review at home.  Surgery scheduled for 08/21/2022.  - Currently with h/o DM, on Mounjaro (changed from [place] several months ago due to poor control).  Uses Dexcom for monitoring. Discussed pre-operative management. Likley does not require cessation of Mounjaro for minor surgery.  - Essential HTN, currently with low-normal BPs. May need to further discuss management of HTN with PCP.  - Advised to d/c Danazol at this time while utilizing the [place].  May also need to consider non-hormonal alternative to Estrace for management of menopausal symptoms as this may be contributing to endometriosis flare. Can discuss further after surgery.    A total of 25  minutes were spent face-to-face with the patient during this encounter and over half of that time involved counseling and  coordination of care.   Rubie Maid, MD [place]

## 2022-07-12 NOTE — H&P (Signed)
GYNECOLOGY PREOPERATIVE HISTORY AND PHYSICAL   Subjective:  [name] is a 42 y.o. [id number] here for surgical management of chronic pelvic pain, history of endometriosis.  She has a prior history of hysterectomy with BSO. She reports that she has had this surgery several times before. Last surgery was in 2022 (performed at [facility] Has been having worsening pelvic pain over the past 6 months, requiring use of occasional Oxycodone on top of her usual Tramadol dosing. Recently started on Orilissa 200 mg due to complaints of pelvic pain and dyspareunia.   Proposed surgery: Laparoscopic excision and fulguration of endometriosis    Pertinent Gynecological History: Menses: post-menopausal (surgical) Contraception: status post hysterectomy Last mammogram: normal Date: 05/12/2022 Last pap: not applicable.    Past Medical History:  Diagnosis Date   Abnormal Pap smear    Anxiety    Diabetes mellitus without complication ([place])    Endometriosis    Family history of hemochromatosis    mother    HA (headache)    otc med prn   Hyperlipidemia    diet controlled, no meds   Hypertension    PONV (postoperative nausea and vomiting)    Pregnancy induced hypertension 2008    Past Surgical History:  Procedure Laterality Date   ABDOMINAL HYSTERECTOMY     ABLATION ON ENDOMETRIOSIS N/A 06/04/2014   Procedure: ABLATION ON ENDOMETRIOSIS;  Surgeon: Allena Katz, MD;  Location: [facility];  Service: Gynecology;  Laterality: N/A;   BIOPSY  12/01/2011   Procedure: BIOPSY;  Surgeon: Allena Katz, MD;  Location: [facility];  Service: Gynecology;  Laterality: N/A;   CESAREAN SECTION  2008   CESAREAN SECTION WITH BILATERAL TUBAL LIGATION N/A 12/02/2012   Procedure: Repeat Cesarean Section Delivery Baby Girl @ [place], Apgars 8/9, Bilateral Tubal Ligation;  Surgeon: Allena Katz, MD;  Location: [facility];  Service: Obstetrics;  Laterality: N/A;   COLPOSCOPY     as teenager, results WNL    ENDOMETRIAL BIOPSY     LAPAROSCOPIC ASSISTED VAGINAL HYSTERECTOMY  2012   with bilateral salpingectomy   LAPAROSCOPIC BILATERAL SALPINGECTOMY Bilateral 02/15/2015   Procedure: LAPAROSCOPIC BILATERAL SALPINGECTOMY;  Surgeon: Rubie Maid, MD;  Location: [facility];  Service: Gynecology;  Laterality: Bilateral;   LAPAROSCOPIC LYSIS OF ADHESIONS N/A 06/04/2014   Procedure: LAPAROSCOPIC LYSIS OF ADHESIONS;  Surgeon: Allena Katz, MD;  Location: [facility];  Service: Gynecology;  Laterality: N/A;   LAPAROSCOPIC SALPINGO OOPHERECTOMY Right 07/17/2016   Procedure: LAPAROSCOPIC RIGHT SALPINGO OOPHORECTOMY;  Surgeon: Rubie Maid, MD;  Location: [facility];  Service: Gynecology;  Laterality: Right;   LAPAROSCOPY  12/01/2011   Procedure: LAPAROSCOPY OPERATIVE;  Surgeon: Allena Katz, MD;  Location: [facility];  Service: Gynecology;  Laterality: N/A;   LAPAROSCOPY N/A 06/04/2014   Procedure: LAPAROSCOPY OPERATIVE;  Surgeon: Allena Katz, MD;  Location: [facility];  Service: Gynecology;  Laterality: N/A;   LAPAROSCOPY N/A 02/14/2016   Procedure: LAPAROSCOPY DIAGNOSTIC WITH BIOPSIES POSSIBLE ADHESIOLYSIS;  Surgeon: Rubie Maid, MD;  Location: [facility];  Service: Gynecology;  Laterality: N/A;   LAPAROSCOPY N/A 05/26/2016   Procedure: LAPAROSCOPY operative with lysis of adhesions;  Surgeon: Harlin Heys, MD;  Location: [facility];  Service: Gynecology;  Laterality: N/A;   OOPHORECTOMY Left    SALPINGECTOMY Bilateral    TONSILLECTOMY     TUBAL LIGATION     TYMPANOSTOMY TUBE PLACEMENT      OB History  Gravida Para Term Preterm  AB Living  2 2 1 1   2   SAB IAB Ectopic Multiple Live Births          2    # Outcome Date GA Lbr Len/2nd Weight Sex Delivery Anes PTL Lv  2 Preterm 12/02/12 [redacted]w[redacted]d  6 lb 6.8 oz (2.915 kg) F CS-LTranv Spinal  LIV  1 Term 2008 [redacted]w[redacted]d  6 lb 14 oz (3.118 kg) F CS-LTranv Spinal N LIV     Birth Comments:  pre-e on MAG, IOL. failure to progress    Family History  Problem Relation Age  of Onset   Hypertension Mother    Hemochromatosis Mother    Diabetes Father    Hypertension Father    Breast cancer Paternal Aunt    Coronary artery disease Paternal Uncle    Ovarian cancer Maternal Grandmother    Lung cancer Maternal Grandfather    Brain cancer Maternal Grandfather    CVA Paternal Grandmother    Heart attack Paternal Grandmother    Kidney disease Paternal Grandfather    Cancer Paternal Grandfather     Social History   Socioeconomic History   Marital status: Married    Spouse name: Not on file   Number of children: 1   Years of education: Not on file   Highest education level: Not on file  Occupational History   Occupation: Chemical engineer: [employer]    Comment: only once in a while   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[employer]    Comment: full time  Tobacco Use   Smoking status: Never   Smokeless tobacco: Never  Vaping Use   Vaping Use: Never used  Substance and Sexual Activity   Alcohol use: Yes    Comment: occasional   Drug use: No   Sexual activity: Yes    Birth control/protection: Surgical  Other Topics Concern   Not on file  Social History Narrative   Not on file   Social Determinants of Health   Financial Resource Strain: Not on file  Food Insecurity: Not on file  Transportation Needs: Not on file  Physical Activity: Not on file  Stress: Not on file  Social Connections: Not on file  Intimate Partner Violence: Not on file    Current Outpatient Medications on File Prior to Visit  Medication Sig Dispense Refill   methylPREDNISolone (MEDROL DOSEPAK) 4 MG TBPK tablet Take Tapered dose as directed 21 tablet 0   amLODipine (NORVASC) 10 MG tablet Take 1 tablet by mouth daily.     atorvastatin (LIPITOR) 20 MG tablet Take 20 mg by mouth daily.     blood glucose meter kit and supplies KIT Dispense based on patient and insurance preference. Use up to four times daily as directed. 1 each 0   danazol (DANOCRINE)  200 MG capsule Take 2 capsules (400 mg total) by mouth 2 (two) times daily. 60 capsule 3   Elagolix Sodium (ORILISSA) 200 MG TABS Take 1 tablet (200 mg total) by mouth in the morning and at bedtime. 60 tablet 5   estradiol (ESTRACE) 1 MG tablet TAKE ONE TABLET BY MOUTH EVERY DAY 90 tablet 3   gabapentin (NEURONTIN) 600 MG tablet TAKE 1 TABLET BY MOUTH THREE TIMES DAILY 90 tablet 3   gemfibrozil (LOPID) 600 MG tablet Take 1 tablet by mouth in the morning and at bedtime.     hydrochlorothiazide (HYDRODIURIL) 25 MG tablet TAKE 1 TABLET BY MOUTH DAILY. TAKE WITH LOSARTAN.  losartan (COZAAR) 100 MG tablet Take 100 mg by mouth daily.     metFORMIN (GLUCOPHAGE-XR) 750 MG 24 hr tablet Take 750 mg by mouth 2 (two) times daily.     metoprolol succinate (TOPROL-XL) 50 MG 24 hr tablet Take 1 tablet (50 mg total) by mouth daily. Take with or immediately following a meal. 90 tablet 3   MOUNJARO 7.5 MG/0.5ML Pen Inject into the skin.     ondansetron (ZOFRAN-ODT) 4 MG disintegrating tablet Take 4 mg by mouth every 8 (eight) hours as needed.     tirzepatide (MOUNJARO) 10 MG/0.5ML Pen Inject 10 mg into the skin every 7 (seven) days. 2 mL 5   traMADol (ULTRAM) 50 MG tablet Take 2 tablets (100 mg total) by mouth every 6 (six) hours as needed for moderate pain or severe pain. 60 tablet 0   No current facility-administered medications on file prior to visit.   Allergies  Allergen Reactions   Sulfamethoxazole-Trimethoprim Swelling and Rash   Amoxil [Amoxicillin] Rash     Has tolerated augmentin!!  ? If had viral resh with amox.   Azithromycin Rash   Cefprozil Rash   Ceftin [Cefuroxime Axetil] Rash   Other Itching    CHG-wipes she had to use prier to surgery.  Ok to use CHG wash without problems CHG-wipes she had to use prier to surgery.  Ok to use CHG wash without problems CHG-wipes she had to use prier to surgery.  Ok to use CHG wash without problems CHG-wipes she had to use prier to surgery.  Ok to use  CHG wash without problems CHG-wipes she had to use prier to surgery.  Ok to use CHG wash without problems   Penicillins Rash    Has patient had a PCN reaction causing immediate rash, facial/tongue/throat swelling, SOB or lightheadedness with hypotension: Yes Has patient had a PCN reaction causing severe rash involving mucus membranes or skin necrosis: No Has patient had a PCN reaction that required hospitalization No Has patient had a PCN reaction occurring within the last 10 years: No If all of the above answers are "NO", then may proceed with Cephalosporin use.  Has patient had a PCN reaction causing immediate rash, facial/tongue/throat swelling, SOB or lightheadedness with hypotension: Yes Has patient had a PCN reaction causing severe rash involving mucus membranes or skin necrosis: No Has patient had a PCN reaction that required hospitalization No Has patient had a PCN reaction occurring within the last 10 years: No If all of the above answers are "NO", then may proceed with Cephalosporin use.  Has tolerated augmentin!!  ? If had viral resh with amox.   Sulfa Antibiotics Rash      Review of Systems Constitutional: No recent fever/chills/sweats Respiratory: No recent cough/bronchitis Cardiovascular: No chest pain Gastrointestinal: No recent nausea/vomiting/diarrhea Genitourinary: No UTI symptoms Hematologic/lymphatic:No history of coagulopathy or recent blood thinner use    Objective:   Blood pressure 99/70, pulse 85, resp. rate 16, height 5\' 5"  (1.651 m), weight 159 lb 4.8 oz (72.3 kg), last menstrual period 10/15/2013. CONSTITUTIONAL: Well-developed, well-nourished female in no acute distress.  HENT:  Normocephalic, atraumatic, External right and left ear normal. Oropharynx is clear and moist EYES: Conjunctivae and EOM are normal. Pupils are equal, round, and reactive to light. No scleral icterus.  NECK: Normal range of motion, supple, no masses SKIN: Skin is warm and dry. No  rash noted. Not diaphoretic. No erythema. No pallor. NEUROLOGIC: Alert and oriented to person, place, and time. Normal reflexes, muscle  tone coordination. No cranial nerve deficit noted. PSYCHIATRIC: Normal mood and affect. Normal behavior. Normal judgment and thought content. CARDIOVASCULAR: Normal heart rate noted, regular rhythm RESPIRATORY: Effort and breath sounds normal, no problems with respiration noted ABDOMEN: Soft, nontender, nondistended. PELVIC: Deferred MUSCULOSKELETAL: Normal range of motion. No edema and no tenderness. 2+ distal pulses.    Labs: Labs reviewed in Care Everywhere, performed 06/30/2022.   CBC: 11.2>14.3/43.2<417  HgbA1c on 05/11/2022 was 8.4.    Imaging Studies: DG Abd 2 Views  Result Date: 06/30/2022 CLINICAL DATA:  Constipation and rectal bleeding. EXAM: ABDOMEN - 2 VIEW COMPARISON:  None Available. FINDINGS: No gaseous small bowel dilatation to suggest obstruction. Prominent stool volume noted throughout the colon with a large amount of stool seen in the rectum. Electronic radiopaque foreign body projects over the right iliac bone on 1 projection, not seen on the upright film. SI joints and symphysis pubis unremarkable. IMPRESSION: Prominent stool volume throughout the colon with a large amount of stool in the rectum. Imaging features compatible with clinical constipation or fecal impaction. Electronic foreign body projects over the right iliac bone on the supine view of the pelvis but is not visible on the upright image which includes the same area and the foreign body is presumably external to the patient. Electronically Signed   By: Misty Stanley M.D.   On: 06/30/2022 18:01    Assessment:    1. Pelvic pain   2. History of endometriosis   3. Type 2 diabetes mellitus without complication, without long-term current use of insulin ([place])   4. Secondary hypertension   5. Preoperative exam for gynecologic surgery       Plan:   - Counseling: Procedure,  risks, reasons, benefits and complications (including injury to bowel, bladder, major blood vessel, ureter, bleeding, possibility of transfusion, infection, or fistula formation) reviewed in detail. Likelihood of success in alleviating the patient's condition was discussed. Routine postoperative instructions will be reviewed with the patient and her family in detail after surgery.  The patient concurred with the proposed plan, giving informed written consent for the surgery.   - Preop testing ordered. - Instructions reviewed, including NPO after midnight. - Type II DM currently on Mounjaro. (changed from [place] several months ago due to poor control).  Uses Dexcom for monitoring. Discussed pre-operative management. Likley does not require cessation of Mounjaro for minor surgery.  - Essential HTN, currently with low-normal BPs.        Rubie Maid, [facility]

## 2022-07-21 ENCOUNTER — Other Ambulatory Visit: Payer: Self-pay | Admitting: Obstetrics and Gynecology

## 2022-07-21 ENCOUNTER — Encounter: Payer: Self-pay | Admitting: Obstetrics and Gynecology

## 2022-07-24 MED ORDER — TRAMADOL HCL 50 MG PO TABS: 100.0000 mg | ORAL_TABLET | Freq: Four times a day (QID) | ORAL | 0 refills | Status: DC | PRN

## 2022-07-25 NOTE — Telephone Encounter (Signed)
I reviewed, again.  We can change to date requested in May. Will also need to change post-op visit.

## 2022-07-31 ENCOUNTER — Encounter: Payer: Self-pay | Admitting: Obstetrics and Gynecology

## 2022-07-31 MED ORDER — OXYCODONE-ACETAMINOPHEN 5-325 MG PO TABS: 1.0000 | ORAL_TABLET | Freq: Four times a day (QID) | ORAL | 0 refills | Status: DC | PRN

## 2022-08-02 ENCOUNTER — Other Ambulatory Visit: Payer: Self-pay | Admitting: Obstetrics and Gynecology

## 2022-08-02 DIAGNOSIS — R399 Unspecified symptoms and signs involving the genitourinary system: Secondary | ICD-10-CM | POA: Diagnosis not present

## 2022-08-03 ENCOUNTER — Encounter: Payer: Self-pay | Admitting: Obstetrics and Gynecology

## 2022-08-03 DIAGNOSIS — B3731 Acute candidiasis of vulva and vagina: Secondary | ICD-10-CM

## 2022-08-03 MED ORDER — OXYCODONE-ACETAMINOPHEN 5-325 MG PO TABS: 1.0000 | ORAL_TABLET | Freq: Four times a day (QID) | ORAL | 0 refills | Status: DC | PRN

## 2022-08-03 MED ORDER — TRAMADOL HCL 50 MG PO TABS: 100.0000 mg | ORAL_TABLET | Freq: Four times a day (QID) | ORAL | 0 refills | Status: DC | PRN

## 2022-08-04 DIAGNOSIS — E1165 Type 2 diabetes mellitus with hyperglycemia: Secondary | ICD-10-CM | POA: Diagnosis not present

## 2022-08-04 MED ORDER — FLUCONAZOLE 150 MG PO TABS: 150.0000 mg | ORAL_TABLET | Freq: Every day | ORAL | 0 refills | Status: DC

## 2022-08-04 NOTE — Telephone Encounter (Signed)
PA not needed.

## 2022-08-05 DIAGNOSIS — J309 Allergic rhinitis, unspecified: Secondary | ICD-10-CM | POA: Diagnosis not present

## 2022-08-05 DIAGNOSIS — Z6824 Body mass index (BMI) 24.0-24.9, adult: Secondary | ICD-10-CM | POA: Diagnosis not present

## 2022-08-05 DIAGNOSIS — J029 Acute pharyngitis, unspecified: Secondary | ICD-10-CM | POA: Diagnosis not present

## 2022-08-07 ENCOUNTER — Other Ambulatory Visit: Payer: Self-pay

## 2022-08-07 ENCOUNTER — Other Ambulatory Visit: Payer: 59

## 2022-08-10 DIAGNOSIS — E11649 Type 2 diabetes mellitus with hypoglycemia without coma: Secondary | ICD-10-CM | POA: Diagnosis not present

## 2022-08-10 DIAGNOSIS — I10 Essential (primary) hypertension: Secondary | ICD-10-CM | POA: Diagnosis not present

## 2022-08-10 DIAGNOSIS — E1169 Type 2 diabetes mellitus with other specified complication: Secondary | ICD-10-CM | POA: Diagnosis not present

## 2022-08-10 DIAGNOSIS — E785 Hyperlipidemia, unspecified: Secondary | ICD-10-CM | POA: Diagnosis not present

## 2022-08-10 DIAGNOSIS — Z794 Long term (current) use of insulin: Secondary | ICD-10-CM | POA: Diagnosis not present

## 2022-08-11 ENCOUNTER — Other Ambulatory Visit: Payer: 59

## 2022-08-21 ENCOUNTER — Encounter
Admission: RE | Admit: 2022-08-21 | Discharge: 2022-08-21 | Disposition: A | Discharge status: Home / Self Care | Payer: 59 | Source: Ambulatory Visit | Attending: Obstetrics and Gynecology | Admitting: Obstetrics and Gynecology

## 2022-08-21 VITALS — Ht 65.0 in | Wt 140.0 lb

## 2022-08-21 DIAGNOSIS — Z01812 Encounter for preprocedural laboratory examination: Secondary | ICD-10-CM

## 2022-08-21 DIAGNOSIS — I10 Essential (primary) hypertension: Secondary | ICD-10-CM

## 2022-08-21 HISTORY — DX: Migraine, unspecified, not intractable, without status migrainosus: G43.909

## 2022-08-21 NOTE — Patient Instructions (Addendum)
Your procedure is scheduled on: Monday, May 13 Report to the Registration Desk on the 1st floor of the [facility] To find out your arrival time, please call [phone] between 1PM - 3PM on: Friday, May 10 If your arrival time is 6:00 am, do not arrive before that time as the [facility] entrance doors do not open until 6:00 am.  REMEMBER: Instructions that are not followed completely may result in serious medical risk, up to and including death; or upon the discretion of your surgeon and anesthesiologist your surgery may need to be rescheduled.  Do not eat or drink after midnight the night before surgery.  No gum chewing or hard candies.  One week prior to surgery: starting May 6 Stop Anti-inflammatories (NSAIDS) such as Advil, Aleve, Ibuprofen, Motrin, Naproxen, Naprosyn and Aspirin based products such as Excedrin, Goody's Powder, BC Powder. Stop ANY OVER THE COUNTER supplements until after surgery. Stop probiotics. You may however, continue to take Tylenol if needed for pain up until the day of surgery.  Continue taking all prescribed medications with the exception of the following:  Mounjaro hold 7 days before surgery. Do not take starting today, May 6. Resume AFTER surgery on your regular scheduled day.  Marcelline Deist hold 3 days before surgery. Last day to TAKE Marcelline Deist is Thursday, May 9. Resume AFTER surgery.  Metformin hold 2 days before surgery. Last day to TAKE metformin is Friday, May 10. Resume AFTER surgery.  Lantus - only take half of your regular dose the night before surgery. Only take 15 units on Sunday night May 12.   TAKE ONLY THESE MEDICATIONS THE MORNING OF SURGERY WITH A SIP OF WATER:  Amlodipine Atorvastatin Metoprolol  No Alcohol for 24 hours before or after surgery.  No Smoking including e-cigarettes for 24 hours before surgery.  No chewable tobacco products for at least 6 hours before surgery.  No nicotine patches on the day of surgery.  Do not use any  "recreational" drugs for at least a week (preferably 2 weeks) before your surgery.  Please be advised that the combination of cocaine and anesthesia may have negative outcomes, up to and including death. If you test positive for cocaine, your surgery will be cancelled.  On the morning of surgery brush your teeth with toothpaste and water, you may rinse your mouth with mouthwash if you wish. Do not swallow any toothpaste or mouthwash.  Use CHG Soap as directed on instruction sheet.  Do not wear jewelry, make-up, hairpins, clips or nail polish.  Do not wear lotions, powders, or perfumes.   Do not shave body hair from the neck down 48 hours before surgery.  Contact lenses, hearing aids and dentures may not be worn into surgery.  Do not bring valuables to the hospital. [facility] is not responsible for any missing/lost belongings or valuables.   Notify your doctor if there is any change in your medical condition (cold, fever, infection).  Wear comfortable clothing (specific to your surgery type) to the hospital.  After surgery, you can help prevent lung complications by doing breathing exercises.  Take deep breaths and cough every 1-2 hours. Your doctor may order a device called an Incentive Spirometer to help you take deep breaths. When coughing or sneezing, hold a pillow firmly against your incision with both hands. This is called "splinting." Doing this helps protect your incision. It also decreases belly discomfort.  If you are being discharged the day of surgery, you will not be allowed to drive  home. You will need a responsible individual to drive you home and stay with you for 24 hours after surgery.   If you are taking public transportation, you will need to have a responsible individual with you.  Please call the Pre-admissions Testing Dept. at [phone] if you have any questions about these instructions.  Surgery Visitation Policy:  Patients having surgery or a  procedure may have two visitors.  Children under the age of 10 must have an adult with them who is not the patient.     Preparing for Surgery with CHLORHEXIDINE GLUCONATE (CHG) Soap  Chlorhexidine Gluconate (CHG) Soap  o An antiseptic cleaner that kills germs and bonds with the skin to continue killing germs even after washing  o Used for showering the night before surgery and morning of surgery  Before surgery, you can play an important role by reducing the number of germs on your skin.  CHG (Chlorhexidine gluconate) soap is an antiseptic cleanser which kills germs and bonds with the skin to continue killing germs even after washing.  Please do not use if you have an allergy to CHG or antibacterial soaps. If your skin becomes reddened/irritated stop using the CHG.  1. Shower the NIGHT BEFORE SURGERY and the MORNING OF SURGERY with CHG soap.  2. If you choose to wash your hair, wash your hair first as usual with your normal shampoo.  3. After shampooing, rinse your hair and body thoroughly to remove the shampoo.  4. Use CHG as you would any other liquid soap. You can apply CHG directly to the skin and wash gently with a scrungie or a clean washcloth.  5. Apply the CHG soap to your body only from the neck down. Do not use on open wounds or open sores. Avoid contact with your eyes, ears, mouth, and genitals (private parts). Wash face and genitals (private parts) with your normal soap.  6. Wash thoroughly, paying special attention to the area where your surgery will be performed.  7. Thoroughly rinse your body with warm water.  8. Do not shower/wash with your normal soap after using and rinsing off the CHG soap.  9. Pat yourself dry with a clean towel.  10. Wear clean pajamas to bed the night before surgery.  12. Place clean sheets on your bed the night of your first shower and do not sleep with pets.  13. Shower again with the CHG soap on the day of surgery prior to arriving at  the hospital.  14. Do not apply any deodorants/lotions/powders.  15. Please wear clean clothes to the hospital.

## 2022-08-22 ENCOUNTER — Encounter: Payer: Self-pay | Admitting: Obstetrics and Gynecology

## 2022-08-22 NOTE — Progress Notes (Signed)
  Perioperative Services Pre-Admission/Anesthesia Testing    Date: 08/22/22  Name: [name] MRN:   [id number]  Re: GLP-1 clearance and provider recommendations   Planned Surgical Procedure(s):    Case: [id number] Date/Time: 08/28/22 0715   Procedures:      LAPAROSCOPIC EXCISION AND FULGURATION OF ENDOMETRIOSIS     LYSIS OF ADHESIONS   Anesthesia type: General   Pre-op diagnosis: Pelvic pain, History of endometriosis   Location: [facility] OR ROOM 05 / [facility]   Surgeons: Hildred Laser, MD   Clinical Notes:  Patient is scheduled for the above procedure with the indicated provider/surgeon. In review of her medication reconciliation it was noted that patient is on a prescribed GLP-1 medication. Per guidelines issued by the American Society of Anesthesiologists (ASA), it is recommended that these medications be held for 7 days prior to the patient undergoing any type of elective surgical procedure. The patient is taking the following GLP-1 medication:  []  SEMAGLUTIDE   []  EXENATIDE  []  LIRAGLUTIDE   []  LIXISENATIDE  []  DULAGLUTIDE     [x]  TIRZEPATIDE (GLP-1/GIP)  Reached out to prescribing provider Tedd Sias, MD) to make them aware of the guidelines from anesthesia. Given that this patient takes the prescribed GLP-1 medication for her  diabetes diagnosis, rather than for weight loss, recommendations from the prescribing provider were solicited. Prescribing provider made aware of the following so that informed decision/POC can be developed for this patient that may be taking medications belonging to these drug classes:  Oral GLP-1 medications will be held 1 day prior to surgery.  Injectable GLP-1 medications will be held 7 days prior to surgery.  Metformin is routinely held 48 hours prior to surgery due to renal concerns, potential need for contrasted imaging perioperatively, and the potential for tissue hypoxia leading to drug induced lactic acidosis.  All  SGLT2i medications are held 72 hours prior to surgery as they can be associated with the increased potential for developing euglycemic diabetic ketoacidosis (EDKA).   Impression and Plan:  [name] is on a prescribed GLP-1 medication, which induces the known side effect of decreased gastric emptying. Efforts are bring made to mitigate the risk of perioperative hyperglycemic events, as elevated blood glucose levels have been found to contribute to intra/postoperative complications. Additionally, hyperglycemic extremes can potentially necessitate the postponing of a patient's elective case in order to better optimize perioperative glycemic control, again with the aforementioned guidelines in place. With this in mind, recommendations have been sought from the prescribing provider, who has cleared patient to proceed with holding the prescribed GLP-1/GIP (tirzepatide) as per the guidelines from the ASA.   Provider recommending: no further recommendations received from the prescribing provider.  Copy of signed clearance and recommendations placed on patient's chart for inclusion in their medical record and for review by the surgical/anesthetic team on the day of her procedure.   Quentin Mulling, MSN, APRN, FNP-C, CEN [facility]  Peri-operative Services Nurse Practitioner Phone: [phone] 08/22/22 8:16 AM  NOTE: This note has been prepared using Dragon dictation software. Despite my best ability to proofread, there is always the potential that unintentional transcriptional errors may still occur from this process.

## 2022-08-23 ENCOUNTER — Encounter
Admission: RE | Admit: 2022-08-23 | Discharge: 2022-08-23 | Disposition: A | Discharge status: Home / Self Care | Payer: 59 | Source: Ambulatory Visit | Attending: Obstetrics and Gynecology | Admitting: Obstetrics and Gynecology

## 2022-08-23 DIAGNOSIS — I10 Essential (primary) hypertension: Secondary | ICD-10-CM | POA: Diagnosis not present

## 2022-08-23 DIAGNOSIS — Z01812 Encounter for preprocedural laboratory examination: Secondary | ICD-10-CM

## 2022-08-23 DIAGNOSIS — Z01818 Encounter for other preprocedural examination: Secondary | ICD-10-CM | POA: Insufficient documentation

## 2022-08-23 LAB — CBC
HCT: 43.6 % (ref 36.0–46.0)
Hemoglobin: 14.3 g/dL (ref 12.0–15.0)
MCH: 29.8 pg (ref 26.0–34.0)
MCHC: 32.8 g/dL (ref 30.0–36.0)
MCV: 90.8 fL (ref 80.0–100.0)
Platelets: 406 10*3/uL — ABNORMAL HIGH (ref 150–400)
RBC: 4.8 MIL/uL (ref 3.87–5.11)
RDW: 12.7 % (ref 11.5–15.5)
WBC: 6.5 10*3/uL (ref 4.0–10.5)
nRBC: 0 % (ref 0.0–0.2)

## 2022-08-24 ENCOUNTER — Other Ambulatory Visit: Payer: Self-pay | Admitting: Obstetrics and Gynecology

## 2022-08-24 ENCOUNTER — Encounter: Payer: Self-pay | Admitting: Obstetrics and Gynecology

## 2022-08-24 MED ORDER — OXYCODONE-ACETAMINOPHEN 5-325 MG PO TABS: 1.0000 | ORAL_TABLET | Freq: Four times a day (QID) | ORAL | 0 refills | Status: DC | PRN

## 2022-08-24 MED ORDER — TRAMADOL HCL 50 MG PO TABS: 100.0000 mg | ORAL_TABLET | Freq: Four times a day (QID) | ORAL | 0 refills | Status: DC | PRN

## 2022-08-24 NOTE — Progress Notes (Signed)
Patient desires to go ahead and have pain medication sent to her pharmacy so that she can have it available for surgery which is scheduled for this upcoming Monday.  Prescriptions for chronic pain management with tramadol filled today, also went ahead and sent small prescription of Percocet to patient's pharmacy for pain management for upcoming surgery.  Dr. Valentino Saxon

## 2022-08-27 MED ORDER — FAMOTIDINE 20 MG PO TABS
20.0000 mg | ORAL_TABLET | Freq: Once | ORAL | Status: AC
  Administered 2022-08-28: 20 mg via ORAL

## 2022-08-27 MED ORDER — CELECOXIB 200 MG PO CAPS
400.0000 mg | ORAL_CAPSULE | ORAL | Status: AC
  Administered 2022-08-28: 400 mg via ORAL

## 2022-08-27 MED ORDER — ORAL CARE MOUTH RINSE: 15.0000 mL | Freq: Once | OROMUCOSAL | Status: AC

## 2022-08-27 MED ORDER — SODIUM CHLORIDE 0.9 % IV SOLN: INTRAVENOUS | Status: DC

## 2022-08-27 MED ORDER — ACETAMINOPHEN 500 MG PO TABS
1000.0000 mg | ORAL_TABLET | ORAL | Status: AC
  Administered 2022-08-28: 1000 mg via ORAL

## 2022-08-27 MED ORDER — LACTATED RINGERS IV SOLN: INTRAVENOUS | Status: DC

## 2022-08-27 MED ORDER — CHLORHEXIDINE GLUCONATE 0.12 % MT SOLN
15.0000 mL | Freq: Once | OROMUCOSAL | Status: AC
  Administered 2022-08-28: 15 mL via OROMUCOSAL

## 2022-08-27 MED ORDER — GABAPENTIN 300 MG PO CAPS
300.0000 mg | ORAL_CAPSULE | ORAL | Status: AC
  Administered 2022-08-28: 300 mg via ORAL

## 2022-08-28 ENCOUNTER — Other Ambulatory Visit: Payer: Self-pay

## 2022-08-28 ENCOUNTER — Ambulatory Visit
Admission: RE | Admit: 2022-08-28 | Discharge: 2022-08-28 | Disposition: A | Discharge status: Home / Self Care | Payer: 59 | Attending: Obstetrics and Gynecology | Admitting: Obstetrics and Gynecology

## 2022-08-28 ENCOUNTER — Ambulatory Visit: Payer: 59 | Admitting: Urgent Care

## 2022-08-28 ENCOUNTER — Encounter: Payer: Self-pay | Admitting: Obstetrics and Gynecology

## 2022-08-28 ENCOUNTER — Encounter
Admission: RE | Disposition: A | Discharge status: Home / Self Care | Payer: Self-pay | Source: Home / Self Care | Attending: Obstetrics and Gynecology

## 2022-08-28 DIAGNOSIS — N803 Endometriosis of pelvic peritoneum, unspecified: Secondary | ICD-10-CM | POA: Diagnosis not present

## 2022-08-28 DIAGNOSIS — Z01812 Encounter for preprocedural laboratory examination: Secondary | ICD-10-CM

## 2022-08-28 DIAGNOSIS — K660 Peritoneal adhesions (postprocedural) (postinfection): Secondary | ICD-10-CM | POA: Insufficient documentation

## 2022-08-28 DIAGNOSIS — G8929 Other chronic pain: Secondary | ICD-10-CM | POA: Insufficient documentation

## 2022-08-28 DIAGNOSIS — Z90710 Acquired absence of both cervix and uterus: Secondary | ICD-10-CM | POA: Diagnosis not present

## 2022-08-28 DIAGNOSIS — R102 Pelvic and perineal pain: Secondary | ICD-10-CM | POA: Diagnosis not present

## 2022-08-28 DIAGNOSIS — E119 Type 2 diabetes mellitus without complications: Secondary | ICD-10-CM | POA: Diagnosis not present

## 2022-08-28 DIAGNOSIS — I10 Essential (primary) hypertension: Secondary | ICD-10-CM | POA: Insufficient documentation

## 2022-08-28 DIAGNOSIS — Z7985 Long-term (current) use of injectable non-insulin antidiabetic drugs: Secondary | ICD-10-CM | POA: Diagnosis not present

## 2022-08-28 DIAGNOSIS — N809 Endometriosis, unspecified: Secondary | ICD-10-CM | POA: Diagnosis not present

## 2022-08-28 HISTORY — DX: Type 2 diabetes mellitus without complications: E11.9

## 2022-08-28 HISTORY — DX: Hyperlipidemia, unspecified: E78.5

## 2022-08-28 HISTORY — DX: Dyshidrosis (pompholyx): L30.1

## 2022-08-28 HISTORY — DX: Type 2 diabetes mellitus without complications: Z79.4

## 2022-08-28 LAB — URINE DRUG SCREEN, QUALITATIVE (ARMC ONLY)
Amphetamines, Ur Screen: NOT DETECTED
Barbiturates, Ur Screen: NOT DETECTED
Benzodiazepine, Ur Scrn: POSITIVE — AB
Cannabinoid 50 Ng, Ur ~~LOC~~: NOT DETECTED
Cocaine Metabolite,Ur ~~LOC~~: NOT DETECTED
MDMA (Ecstasy)Ur Screen: NOT DETECTED
Methadone Scn, Ur: NOT DETECTED
Opiate, Ur Screen: NOT DETECTED
Phencyclidine (PCP) Ur S: NOT DETECTED
Tricyclic, Ur Screen: NOT DETECTED

## 2022-08-28 LAB — GLUCOSE, CAPILLARY
Glucose-Capillary: 134 mg/dL — ABNORMAL HIGH (ref 70–99)
Glucose-Capillary: 137 mg/dL — ABNORMAL HIGH (ref 70–99)

## 2022-08-28 SURGERY — EXCISION, ENDOMETRIOSIS, LAPAROSCOPIC
Anesthesia: General | Site: Abdomen

## 2022-08-28 MED ORDER — MIDAZOLAM HCL 2 MG/2ML IJ SOLN
INTRAMUSCULAR | Status: AC
  Filled 2022-08-28: qty 2

## 2022-08-28 MED ORDER — SUGAMMADEX SODIUM 200 MG/2ML IV SOLN
INTRAVENOUS | Status: DC | PRN
  Administered 2022-08-28: 200 mg via INTRAVENOUS

## 2022-08-28 MED ORDER — CELECOXIB 200 MG PO CAPS
ORAL_CAPSULE | ORAL | Status: AC
  Filled 2022-08-28: qty 2

## 2022-08-28 MED ORDER — FENTANYL CITRATE (PF) 100 MCG/2ML IJ SOLN
INTRAMUSCULAR | Status: AC
  Filled 2022-08-28: qty 2

## 2022-08-28 MED ORDER — ONDANSETRON HCL 4 MG/2ML IJ SOLN
INTRAMUSCULAR | Status: DC | PRN
  Administered 2022-08-28: 4 mg via INTRAVENOUS

## 2022-08-28 MED ORDER — MIDAZOLAM HCL 2 MG/2ML IJ SOLN
INTRAMUSCULAR | Status: DC | PRN
  Administered 2022-08-28: 2 mg via INTRAVENOUS

## 2022-08-28 MED ORDER — OXYCODONE HCL 5 MG PO TABS
ORAL_TABLET | ORAL | Status: AC
  Filled 2022-08-28: qty 1

## 2022-08-28 MED ORDER — FENTANYL CITRATE (PF) 100 MCG/2ML IJ SOLN
25.0000 ug | INTRAMUSCULAR | Status: DC | PRN
  Administered 2022-08-28 (×4): 25 ug via INTRAVENOUS

## 2022-08-28 MED ORDER — FAMOTIDINE 20 MG PO TABS
ORAL_TABLET | ORAL | Status: AC
  Filled 2022-08-28: qty 1

## 2022-08-28 MED ORDER — BUPIVACAINE HCL (PF) 0.5 % IJ SOLN
INTRAMUSCULAR | Status: AC
  Filled 2022-08-28: qty 30

## 2022-08-28 MED ORDER — LIDOCAINE HCL (CARDIAC) PF 100 MG/5ML IV SOSY
PREFILLED_SYRINGE | INTRAVENOUS | Status: DC | PRN
  Administered 2022-08-28: 60 mg via INTRAVENOUS

## 2022-08-28 MED ORDER — OXYCODONE HCL 5 MG PO TABS
5.0000 mg | ORAL_TABLET | Freq: Once | ORAL | Status: AC | PRN
  Administered 2022-08-28: 5 mg via ORAL

## 2022-08-28 MED ORDER — CHLORHEXIDINE GLUCONATE 0.12 % MT SOLN
OROMUCOSAL | Status: AC
  Filled 2022-08-28: qty 15

## 2022-08-28 MED ORDER — PHENYLEPHRINE 80 MCG/ML (10ML) SYRINGE FOR IV PUSH (FOR BLOOD PRESSURE SUPPORT)
PREFILLED_SYRINGE | INTRAVENOUS | Status: DC | PRN
  Administered 2022-08-28: 160 ug via INTRAVENOUS
  Administered 2022-08-28: 80 ug via INTRAVENOUS

## 2022-08-28 MED ORDER — ROCURONIUM BROMIDE 100 MG/10ML IV SOLN
INTRAVENOUS | Status: DC | PRN
  Administered 2022-08-28: 50 mg via INTRAVENOUS

## 2022-08-28 MED ORDER — OXYCODONE HCL 5 MG/5ML PO SOLN: 5.0000 mg | Freq: Once | ORAL | Status: AC | PRN

## 2022-08-28 MED ORDER — 0.9 % SODIUM CHLORIDE (POUR BTL) OPTIME
TOPICAL | Status: DC | PRN
  Administered 2022-08-28: 500 mL

## 2022-08-28 MED ORDER — FENTANYL CITRATE (PF) 100 MCG/2ML IJ SOLN
INTRAMUSCULAR | Status: DC | PRN
  Administered 2022-08-28 (×2): 50 ug via INTRAVENOUS

## 2022-08-28 MED ORDER — GABAPENTIN 300 MG PO CAPS
ORAL_CAPSULE | ORAL | Status: AC
  Filled 2022-08-28: qty 1

## 2022-08-28 MED ORDER — BUPIVACAINE HCL 0.5 % IJ SOLN
INTRAMUSCULAR | Status: DC | PRN
  Administered 2022-08-28: 10 mL

## 2022-08-28 MED ORDER — EPHEDRINE SULFATE (PRESSORS) 50 MG/ML IJ SOLN
INTRAMUSCULAR | Status: DC | PRN
  Administered 2022-08-28: 10 mg via INTRAVENOUS

## 2022-08-28 MED ORDER — DEXAMETHASONE SODIUM PHOSPHATE 10 MG/ML IJ SOLN
INTRAMUSCULAR | Status: DC | PRN
  Administered 2022-08-28: 10 mg via INTRAVENOUS

## 2022-08-28 MED ORDER — PROPOFOL 10 MG/ML IV BOLUS
INTRAVENOUS | Status: AC
  Filled 2022-08-28: qty 20

## 2022-08-28 MED ORDER — ACETAMINOPHEN 500 MG PO TABS
ORAL_TABLET | ORAL | Status: AC
  Filled 2022-08-28: qty 2

## 2022-08-28 MED ORDER — PROPOFOL 10 MG/ML IV BOLUS
INTRAVENOUS | Status: DC | PRN
  Administered 2022-08-28: 150 mg via INTRAVENOUS

## 2022-08-28 SURGICAL SUPPLY — 44 items
ADH SKN CLS APL DERMABOND .7 (GAUZE/BANDAGES/DRESSINGS) ×2
APL PRP STRL LF DISP 70% ISPRP (MISCELLANEOUS) ×2
BLADE SURG SZ11 CARB STEEL (BLADE) ×2 IMPLANT
CATH ROBINSON RED A/P 16FR (CATHETERS) ×2 IMPLANT
CHLORAPREP W/TINT 26 (MISCELLANEOUS) ×2 IMPLANT
CNTNR URN SCR LID CUP LEK RST (MISCELLANEOUS) ×1 IMPLANT
CONT SPEC 4OZ STRL OR WHT (MISCELLANEOUS) ×2
CORD MONOPOLAR M/FML 12FT (MISCELLANEOUS) IMPLANT
DERMABOND ADVANCED .7 DNX12 (GAUZE/BANDAGES/DRESSINGS) ×2 IMPLANT
GAUZE 4X4 16PLY ~~LOC~~+RFID DBL (SPONGE) ×4 IMPLANT
GLOVE BIO SURGEON STRL SZ 6.5 (GLOVE) ×4 IMPLANT
GLOVE INDICATOR 7.0 STRL GRN (GLOVE) ×2 IMPLANT
GLOVE PI ORTHO PRO STRL 7.5 (GLOVE) ×2 IMPLANT
GOWN STRL REUS W/ TWL LRG LVL3 (GOWN DISPOSABLE) ×6 IMPLANT
GOWN STRL REUS W/TWL LRG LVL3 (GOWN DISPOSABLE) ×6
GOWN STRL REUS W/TWL XL LVL4 (GOWN DISPOSABLE) ×2 IMPLANT
GRASPER SUT TROCAR 14GX15 (MISCELLANEOUS) IMPLANT
IRRIGATION STRYKERFLOW (MISCELLANEOUS) ×2 IMPLANT
IRRIGATOR STRYKERFLOW (MISCELLANEOUS) ×2
IV LACTATED RINGERS 1000ML (IV SOLUTION) ×2 IMPLANT
KIT PINK PAD W/HEAD ARE REST (MISCELLANEOUS) ×2
KIT PINK PAD W/HEAD ARM REST (MISCELLANEOUS) ×2 IMPLANT
KIT TURNOVER CYSTO (KITS) ×2 IMPLANT
MANIFOLD NEPTUNE II (INSTRUMENTS) ×2 IMPLANT
NS IRRIG 500ML POUR BTL (IV SOLUTION) ×2 IMPLANT
PACK GYN LAPAROSCOPIC (MISCELLANEOUS) ×2 IMPLANT
PAD OB MATERNITY 4.3X12.25 (PERSONAL CARE ITEMS) ×2 IMPLANT
PAD PREP OB/GYN DISP 24X41 (PERSONAL CARE ITEMS) ×2 IMPLANT
SCISSORS METZENBAUM CVD 33 (INSTRUMENTS) IMPLANT
SCRUB CHG 4% DYNA-HEX 4OZ (MISCELLANEOUS) ×2 IMPLANT
SET TUBE SMOKE EVAC HIGH FLOW (TUBING) ×2 IMPLANT
SHEARS HARMONIC ACE PLUS 36CM (ENDOMECHANICALS) IMPLANT
SLEEVE Z-THREAD 5X100MM (TROCAR) ×2 IMPLANT
SUT VIC AB 3-0 SH 27 (SUTURE)
SUT VIC AB 3-0 SH 27X BRD (SUTURE) IMPLANT
SUT VIC AB 4-0 FS2 27 (SUTURE) ×2 IMPLANT
SUT VICRYL 0 UR6 27IN ABS (SUTURE) ×1 IMPLANT
SYS BAG RETRIEVAL 10MM (BASKET)
SYSTEM BAG RETRIEVAL 10MM (BASKET) IMPLANT
TRAP FLUID SMOKE EVACUATOR (MISCELLANEOUS) ×2 IMPLANT
TROCAR XCEL UNIV SLVE 11M 100M (ENDOMECHANICALS) IMPLANT
TROCAR Z-THRD FIOS HNDL 11X100 (TROCAR) ×2 IMPLANT
TROCAR Z-THREAD FIOS 5X100MM (TROCAR) ×2 IMPLANT
WATER STERILE IRR 500ML POUR (IV SOLUTION) ×2 IMPLANT

## 2022-08-28 NOTE — Anesthesia Preprocedure Evaluation (Signed)
Anesthesia Evaluation  Patient identified by MRN, date of birth, ID band Patient awake    Reviewed: Allergy & Precautions, NPO status , Patient's Chart, lab work & pertinent test results  History of Anesthesia Complications (+) PONV and history of anesthetic complications  Airway Mallampati: II  TM Distance: >3 FB Neck ROM: full    Dental  (+) Chipped   Pulmonary neg pulmonary ROS, neg shortness of breath   Pulmonary exam normal        Cardiovascular Exercise Tolerance: Good hypertension, (-) angina (-) Past MI and (-) DOE Normal cardiovascular exam     Neuro/Psych  Headaches  Anxiety      Neuromuscular disease  negative psych ROS   GI/Hepatic negative GI ROS, Neg liver ROS,,,  Endo/Other  diabetes, Type 2Hypothyroidism    Renal/GU      Musculoskeletal   Abdominal   Peds  Hematology negative hematology ROS (+)   Anesthesia Other Findings Past Medical History: No date: Abnormal Pap smear No date: Anxiety No date: Diet-controlled hyperlipidemia No date: Dyshidrotic eczema No date: Endometriosis No date: Family history of hemochromatosis     Comment:  mother  No date: HA (headache)     Comment:  otc med prn No date: Hypertension No date: Migraines No date: PONV (postoperative nausea and vomiting) 2008: Pregnancy induced hypertension No date: Type 2 diabetes mellitus treated with insulin (HCC)  Past Surgical History: 06/04/2014: ABLATION ON ENDOMETRIOSIS; N/A     Comment:  Procedure: ABLATION ON ENDOMETRIOSIS;  Surgeon: Leslie Andrea, MD;  Location: [facility];  Service: Gynecology;               Laterality: N/A; 12/01/2011: BIOPSY     Comment:  Procedure: BIOPSY;  Surgeon: Leslie Andrea, MD;                Location: [facility];  Service: Gynecology;  Laterality: N/A; 2008: CESAREAN SECTION 12/02/2012: CESAREAN SECTION WITH BILATERAL TUBAL LIGATION; N/A     Comment:  Procedure: Repeat  Cesarean Section Delivery Baby Girl @               1826, Apgars 8/9, Bilateral Tubal Ligation;  Surgeon:               Leslie Andrea, MD;  Location: [facility];  Service:               Obstetrics;  Laterality: N/A; No date: COLPOSCOPY     Comment:  as teenager, results WNL No date: ENDOMETRIAL BIOPSY 2012: LAPAROSCOPIC ASSISTED VAGINAL HYSTERECTOMY     Comment:  with bilateral salpingectomy 02/15/2015: LAPAROSCOPIC BILATERAL SALPINGECTOMY; Bilateral     Comment:  Procedure: LAPAROSCOPIC BILATERAL SALPINGECTOMY;                Surgeon: Hildred Laser, MD;  Location: [facility];  Service:              Gynecology;  Laterality: Bilateral; 06/04/2014: LAPAROSCOPIC LYSIS OF ADHESIONS; N/A     Comment:  Procedure: LAPAROSCOPIC LYSIS OF ADHESIONS;  Surgeon:               Leslie Andrea, MD;  Location: [facility];  Service:               Gynecology;  Laterality: N/A; 07/17/2016: LAPAROSCOPIC SALPINGO OOPHERECTOMY; Right     Comment:  Procedure: LAPAROSCOPIC RIGHT SALPINGO OOPHORECTOMY;  Surgeon: Hildred Laser, MD;  Location: [facility];  Service:              Gynecology;  Laterality: Right; 12/01/2011: LAPAROSCOPY     Comment:  Procedure: LAPAROSCOPY OPERATIVE;  Surgeon: Leslie Andrea, MD;  Location: [facility];  Service: Gynecology;               Laterality: N/A; 06/04/2014: LAPAROSCOPY; N/A     Comment:  Procedure: LAPAROSCOPY OPERATIVE;  Surgeon: Leslie Andrea, MD;  Location: [facility];  Service: Gynecology;               Laterality: N/A; 02/14/2016: LAPAROSCOPY; N/A     Comment:  Procedure: LAPAROSCOPY DIAGNOSTIC WITH BIOPSIES POSSIBLE              ADHESIOLYSIS;  Surgeon: Hildred Laser, MD;  Location: [facility]              [facility];  Service: Gynecology;  Laterality: N/A; 05/26/2016: LAPAROSCOPY; N/A     Comment:  Procedure: LAPAROSCOPY operative with lysis of               adhesions;  Surgeon: Linzie Collin, MD;  Location:               [facility];   Service: Gynecology;  Laterality: N/A; No date: OOPHORECTOMY; Left No date: TONSILLECTOMY No date: TUBAL LIGATION No date: TYMPANOSTOMY TUBE PLACEMENT  BMI    Body Mass Index: 23.30 kg/m      Reproductive/Obstetrics negative OB ROS                             Anesthesia Physical Anesthesia Plan  ASA: 3  Anesthesia Plan: General ETT   Post-op Pain Management:    Induction: Intravenous  PONV Risk Score and Plan: Ondansetron, Dexamethasone, Midazolam and Treatment may vary due to age or medical condition  Airway Management Planned: Oral ETT  Additional Equipment:   Intra-op Plan:   Post-operative Plan: Extubation in OR  Informed Consent: I have reviewed the patients History and Physical, chart, labs and discussed the procedure including the risks, benefits and alternatives for the proposed anesthesia with the patient or authorized representative who has indicated his/her understanding and acceptance.     Dental Advisory Given  Plan Discussed with: Anesthesiologist, CRNA and Surgeon  Anesthesia Plan Comments: (Patient consented for risks of anesthesia including but not limited to:  - adverse reactions to medications - damage to eyes, teeth, lips or other oral mucosa - nerve damage due to positioning  - sore throat or hoarseness - Damage to heart, brain, nerves, lungs, other parts of body or loss of life  Patient voiced understanding.)       Anesthesia Quick Evaluation

## 2022-08-28 NOTE — H&P (Signed)
GYNECOLOGY PREOPERATIVE HISTORY AND PHYSICAL   Subjective:  [name] is a 42 y.o. [id number] here for surgical management of chronic pelvic pain. She has a past history of endometriosis, s/p hysterectomy with BSO on HRT, on chronic pain management with Tramdol.  Has continued to have chronic pelvic pain over the years despite multiple medical interventions and laparoscopies assessing for residual disease and scar tissue.  No significant preoperative concerns.  Proposed surgery: Operative laparoscopy with excision and fulguration of endometriosis    Pertinent Gynecological History: Menses:  s/p hysterectomy Contraception: status post hysterectomy Last mammogram: normal Date: 05/12/2022 Last pap: 2016.  No longer needed, s/p hysterectomy.    Past Medical History:  Diagnosis Date   Abnormal Pap smear    Anxiety    Diet-controlled hyperlipidemia    Dyshidrotic eczema    Endometriosis    Family history of hemochromatosis    mother    HA (headache)    otc med prn   Hypertension    Migraines    PONV (postoperative nausea and vomiting)    Pregnancy induced hypertension 2008   Type 2 diabetes mellitus treated with insulin (HCC)     Past Surgical History:  Procedure Laterality Date   ABLATION ON ENDOMETRIOSIS N/A 06/04/2014   Procedure: ABLATION ON ENDOMETRIOSIS;  Surgeon: Leslie Andrea, MD;  Location: [facility];  Service: Gynecology;  Laterality: N/A;   BIOPSY  12/01/2011   Procedure: BIOPSY;  Surgeon: Leslie Andrea, MD;  Location: [facility];  Service: Gynecology;  Laterality: N/A;   CESAREAN SECTION  2008   CESAREAN SECTION WITH BILATERAL TUBAL LIGATION N/A 12/02/2012   Procedure: Repeat Cesarean Section Delivery Baby Girl @ 1826, Apgars 8/9, Bilateral Tubal Ligation;  Surgeon: Leslie Andrea, MD;  Location: [facility];  Service: Obstetrics;  Laterality: N/A;   COLPOSCOPY     as teenager, results WNL   ENDOMETRIAL BIOPSY     LAPAROSCOPIC ASSISTED VAGINAL  HYSTERECTOMY  2012   with bilateral salpingectomy   LAPAROSCOPIC BILATERAL SALPINGECTOMY Bilateral 02/15/2015   Procedure: LAPAROSCOPIC BILATERAL SALPINGECTOMY;  Surgeon: Hildred Laser, MD;  Location: [facility];  Service: Gynecology;  Laterality: Bilateral;   LAPAROSCOPIC LYSIS OF ADHESIONS N/A 06/04/2014   Procedure: LAPAROSCOPIC LYSIS OF ADHESIONS;  Surgeon: Leslie Andrea, MD;  Location: [facility];  Service: Gynecology;  Laterality: N/A;   LAPAROSCOPIC SALPINGO OOPHERECTOMY Right 07/17/2016   Procedure: LAPAROSCOPIC RIGHT SALPINGO OOPHORECTOMY;  Surgeon: Hildred Laser, MD;  Location: [facility];  Service: Gynecology;  Laterality: Right;   LAPAROSCOPY  12/01/2011   Procedure: LAPAROSCOPY OPERATIVE;  Surgeon: Leslie Andrea, MD;  Location: [facility];  Service: Gynecology;  Laterality: N/A;   LAPAROSCOPY N/A 06/04/2014   Procedure: LAPAROSCOPY OPERATIVE;  Surgeon: Leslie Andrea, MD;  Location: [facility];  Service: Gynecology;  Laterality: N/A;   LAPAROSCOPY N/A 02/14/2016   Procedure: LAPAROSCOPY DIAGNOSTIC WITH BIOPSIES POSSIBLE ADHESIOLYSIS;  Surgeon: Hildred Laser, MD;  Location: [facility];  Service: Gynecology;  Laterality: N/A;   LAPAROSCOPY N/A 05/26/2016   Procedure: LAPAROSCOPY operative with lysis of adhesions;  Surgeon: Linzie Collin, MD;  Location: [facility];  Service: Gynecology;  Laterality: N/A;   OOPHORECTOMY Left    TONSILLECTOMY     TUBAL LIGATION     TYMPANOSTOMY TUBE PLACEMENT      OB History  Gravida Para Term Preterm AB Living  2 2 1 1   2   SAB IAB Ectopic Multiple Live Births  2    # Outcome Date GA Lbr Len/2nd Weight Sex Delivery Anes PTL Lv  2 Preterm 12/02/12 [redacted]w[redacted]d  2915 g F CS-LTranv Spinal  LIV  1 Term 2008 [redacted]w[redacted]d  3118 g F CS-LTranv Spinal N LIV     Birth Comments:  pre-e on MAG, IOL. failure to progress    Family History  Problem Relation Age of Onset   Hypertension Mother    Hemochromatosis Mother    Diabetes Father    Hypertension Father     Breast cancer Paternal Aunt    Coronary artery disease Paternal Uncle    Ovarian cancer Maternal Grandmother    Lung cancer Maternal Grandfather    Brain cancer Maternal Grandfather    CVA Paternal Grandmother    Heart attack Paternal Grandmother    Kidney disease Paternal Grandfather    Cancer Paternal Grandfather     Social History   Socioeconomic History   Marital status: Married    Spouse name: Casimiro Needle   Number of children: 2   Years of education: Not on file   Highest education level: Not on file  Occupational History   Occupation: Civil engineer, contracting: [employer]    Comment: only once in a while   Occupation: Engineer, [employer]: [employer]    Comment: full time  Tobacco Use   Smoking status: Never   Smokeless tobacco: Never  Vaping Use   Vaping Use: Never used  Substance and Sexual Activity   Alcohol use: Yes    Comment: occasional   Drug use: No   Sexual activity: Yes    Birth control/protection: Surgical  Other Topics Concern   Not on file  Social History Narrative   Not on file   Social Determinants of Health   Financial Resource Strain: Not on file  Food Insecurity: Not on file  Transportation Needs: Not on file  Physical Activity: Not on file  Stress: Not on file  Social Connections: Not on file  Intimate Partner Violence: Not on file    No current facility-administered medications on file prior to encounter.   Current Outpatient Medications on File Prior to Encounter  Medication Sig Dispense Refill   amLODipine (NORVASC) 10 MG tablet Take 1 tablet by mouth daily.     atorvastatin (LIPITOR) 20 MG tablet Take 20 mg by mouth daily.     estradiol (ESTRACE) 1 MG tablet TAKE ONE TABLET BY MOUTH EVERY DAY 90 tablet 3   hydrochlorothiazide (HYDRODIURIL) 25 MG tablet TAKE 1 TABLET BY MOUTH DAILY. TAKE WITH LOSARTAN.     losartan (COZAAR) 100 MG tablet Take 100 mg by mouth daily.     metoprolol succinate (TOPROL-XL) 50 MG 24  hr tablet Take 1 tablet (50 mg total) by mouth daily. Take with or immediately following a meal. 90 tablet 3   metFORMIN (GLUCOPHAGE-XR) 750 MG 24 hr tablet Take 750 mg by mouth 2 (two) times daily.     ondansetron (ZOFRAN-ODT) 4 MG disintegrating tablet Take 4 mg by mouth every 8 (eight) hours as needed.     tirzepatide (MOUNJARO) 10 MG/0.5ML Pen Inject 10 mg into the skin every 7 (seven) days. (Patient taking differently: Inject 10 mg into the skin every 7 (seven) days. sunday) 2 mL 5    Allergies  Allergen Reactions   Sulfamethoxazole-Trimethoprim Swelling and Rash   Amoxil [Amoxicillin] Rash     Has tolerated augmentin!!  ? If had viral resh with amox.  Azithromycin Rash   Cefprozil Rash   Ceftin [Cefuroxime Axetil] Rash   Other Itching    CHG-wipes she had to use prior to surgery.  Ok to use CHG wash without problems   Penicillins Rash    Has patient had a PCN reaction causing immediate rash, facial/tongue/throat swelling, SOB or lightheadedness with hypotension: Yes Has patient had a PCN reaction causing severe rash involving mucus membranes or skin necrosis: No Has patient had a PCN reaction that required hospitalization No Has patient had a PCN reaction occurring within the last 10 years: No If all of the above answers are "NO", then may proceed with Cephalosporin use.  Has tolerated augmentin!!  ? If had viral resh with amox.   Sulfa Antibiotics Rash      Review of Systems Constitutional: No recent fever/chills/sweats Respiratory: No recent cough/bronchitis Cardiovascular: No chest pain Gastrointestinal: No recent nausea/vomiting/diarrhea Genitourinary: No UTI symptoms Hematologic/lymphatic:No history of coagulopathy or recent blood thinner use    Objective:   Blood pressure 113/80, pulse 68, temperature 98.4 F (36.9 C), temperature source Temporal, resp. rate 16, height 5\' 5"  (1.651 m), weight 63.5 kg, last menstrual period 10/15/2013, SpO2 100 %. CONSTITUTIONAL:  Well-developed, well-nourished female in no acute distress.  HENT:  Normocephalic, atraumatic, External right and left ear normal. Oropharynx is clear and moist EYES: Conjunctivae and EOM are normal. Pupils are equal, round, and reactive to light. No scleral icterus.  NECK: Normal range of motion, supple, no masses SKIN: Skin is warm and dry. No rash noted. Not diaphoretic. No erythema. No pallor. NEUROLOGIC: Alert and oriented to person, place, and time. Normal reflexes, muscle tone coordination. No cranial nerve deficit noted. PSYCHIATRIC: Normal mood and affect. Normal behavior. Normal judgment and thought content. CARDIOVASCULAR: Normal heart rate noted, regular rhythm RESPIRATORY: Effort and breath sounds normal, no problems with respiration noted ABDOMEN: Soft, nontender, nondistended. PELVIC: Deferred MUSCULOSKELETAL: Normal range of motion. No edema and no tenderness. 2+ distal pulses.    Labs: Results for orders placed or performed during the hospital encounter of 08/28/22 (from the past 336 hour(s))  Glucose, capillary   Collection Time: 08/28/22  6:12 AM  Result Value Ref Range   Glucose-Capillary 134 (H) 70 - 99 mg/dL  Results for orders placed or performed during the hospital encounter of 08/23/22 (from the past 336 hour(s))  CBC   Collection Time: 08/23/22  8:24 AM  Result Value Ref Range   WBC 6.5 4.0 - 10.5 K/uL   RBC 4.80 3.87 - 5.11 MIL/uL   Hemoglobin 14.3 12.0 - 15.0 g/dL   HCT 81.1 91.4 - 78.2 %   MCV 90.8 80.0 - 100.0 fL   MCH 29.8 26.0 - 34.0 pg   MCHC 32.8 30.0 - 36.0 g/dL   RDW 95.6 21.3 - 08.6 %   Platelets 406 (H) 150 - 400 K/uL   nRBC 0.0 0.0 - 0.2 %     Imaging Studies: DG Abd 2 Views CLINICAL DATA:  Constipation and rectal bleeding.  EXAM: ABDOMEN - 2 VIEW  COMPARISON:  None Available.  FINDINGS: No gaseous small bowel dilatation to suggest obstruction. Prominent stool volume noted throughout the colon with a large amount of stool seen  in the rectum. Electronic radiopaque foreign body projects over the right iliac bone on 1 projection, not seen on the upright film. SI joints and symphysis pubis unremarkable.  IMPRESSION: Prominent stool volume throughout the colon with a large amount of stool in the rectum. Imaging features compatible with clinical  constipation or fecal impaction.  Electronic foreign body projects over the right iliac bone on the supine view of the pelvis but is not visible on the upright image which includes the same area and the foreign body is presumably external to the patient.  Electronically Signed   By: Kennith Center M.D.   On: 06/30/2022 18:01    Assessment:    Chronic pelvic pain History of endometriosis History of hysterectomy/BSO Chronic pain management (with Toradol)   Plan:   - Counseling: Procedure, risks, reasons, benefits and complications (including injury to bowel, bladder, major blood vessel, ureter, bleeding, possibility of transfusion, infection, or fistula formation) reviewed in detail. Likelihood of success in alleviating the patient's condition was discussed. Routine postoperative instructions will be reviewed with the patient and her family in detail after surgery.  The patient concurred with the proposed plan, giving informed written consent for the surgery.   - Preop testing reviewed. - Has been NPO since midnight.     Hildred Laser, MD [facility]

## 2022-08-28 NOTE — Anesthesia Postprocedure Evaluation (Signed)
Anesthesia Post Note  Patient: [name]  Procedure(s) Performed: OPERATIVE LAPAROSCOPY WITH BIOPSIES (Abdomen)  Patient location during evaluation: PACU Anesthesia Type: General Level of consciousness: awake and alert Pain management: pain level controlled Vital Signs Assessment: post-procedure vital signs reviewed and stable Respiratory status: spontaneous breathing, nonlabored ventilation, respiratory function stable and patient connected to nasal cannula oxygen Cardiovascular status: blood pressure returned to baseline and stable Postop Assessment: no apparent nausea or vomiting Anesthetic complications: no   No notable events documented.   Last Vitals:  Vitals:   08/28/22 0945 08/28/22 0959  BP: 107/74 109/65  Pulse:  68  Resp:  20  Temp: 36.9 C (!) 36.1 C  SpO2:  97%    Last Pain:  Vitals:   08/28/22 0959  TempSrc: Temporal  PainSc: 0-No pain                 Cleda Mccreedy [name]

## 2022-08-28 NOTE — Anesthesia Procedure Notes (Signed)
Procedure Name: Intubation Date/Time: 08/28/2022 7:43 AM  Performed by: Danelle Berry, CRNAPre-anesthesia Checklist: Patient identified, Emergency Drugs available, Suction available and Patient being monitored Patient Re-evaluated:Patient Re-evaluated prior to induction Oxygen Delivery Method: Circle system utilized Preoxygenation: Pre-oxygenation with 100% oxygen Induction Type: IV induction Ventilation: Mask ventilation without difficulty Laryngoscope Size: McGraph and 3 Grade View: Grade I Tube type: Oral Tube size: 7.0 mm Number of attempts: 1 Airway Equipment and Method: Stylet and Oral airway Placement Confirmation: ETT inserted through vocal cords under direct vision, positive ETCO2 and breath sounds checked- equal and bilateral Tube secured with: Tape Dental Injury: Teeth and Oropharynx as per pre-operative assessment

## 2022-08-28 NOTE — Transfer of Care (Signed)
Immediate Anesthesia Transfer of Care Note  Patient: [name]  Procedure(s) Performed: LAPAROSCOPIC EXCISION AND FULGURATION OF ENDOMETRIOSIS (Abdomen) LYSIS OF ADHESIONS (Abdomen)  Patient Location: PACU  Anesthesia Type:General  Level of Consciousness: awake, alert , and oriented  Airway & Oxygen Therapy: Patient Spontanous Breathing  Post-op Assessment: Report given to RN and Post -op Vital signs reviewed and stable  Post vital signs: Reviewed and stable  Last Vitals:  Vitals Value Taken Time  BP 117/81 08/28/22 0911  Temp    Pulse 67 08/28/22 0914  Resp    SpO2 100 % 08/28/22 0914  Vitals shown include unvalidated device data.  Last Pain:  Vitals:   08/28/22 0624  TempSrc: Temporal  PainSc: 0-No pain      Patients Stated Pain Goal: 0 (08/28/22 1610)  Complications: No notable events documented.

## 2022-08-28 NOTE — Discharge Instructions (Signed)

## 2022-08-28 NOTE — Op Note (Signed)
Procedure(s): OPERATIVE LAPAROSCOPY WITH BIOPSIES Procedure Note  [name] female 42 y.o. 08/28/2022  Indications: The patient is a 42 y.o. [id number] female with history of chronic pelvic pain, currently on chronic pain management (Tramadol), history of endometriosis. Is s/p history of hysterectomy with BSO. History of several attempts of management with hormonal medications, as well as undergone several laparoscopic excision procedures for endometriosis.   Pre-operative Diagnosis: Chronic pelvic pain, history of endometriosis, history of hysterectomy with bilateral salpingo-oophorectomy.   Post-operative Diagnosis: Same  Surgeon: Hildred Laser, MD  Assistants:  None.   Anesthesia: General endotracheal anesthesia  Findings: The uterus, cervix, bilateral fallopian tubes and ovaries surgically absent.  Vaginal cuff with few areas of fibrotic tissue noted.  Pelvic side walls with areas of fibrosis. Scant filmy adhesions of small bowel to left pelvic side wall. Normal upper abdomen.    Procedure Details: The patient was seen in the Holding Room. The risks, benefits, complications, treatment options, and expected outcomes were discussed with the patient.  The patient concurred with the proposed plan, giving informed consent.  The site of surgery properly noted/marked. The patient was taken to the Operating Room, identified as [name] and the procedure verified as Procedure(s) (LRB): LAPAROSCOPIC EXCISION AND FULGURATION OF ENDOMETRIOSIS (N/A). A Time Out was held and the above information confirmed.  She was then placed under general anesthesia without difficulty. She was placed in the dorsal lithotomy position, and was prepped and draped in a sterile manner.  A straight catheterization was performed. A sponge stick was inserted into the vagina for manipulation.  After an adequate timeout was performed, attention was turned to the abdomen where an umbilical incision was  made with the scalpel.  The Optiview 5-mm trocar and sleeve were then advanced without difficulty with the laparoscope under direct visualization into the abdomen.  The abdomen was then insufflated with carbon dioxide gas and adequate pneumoperitoneum was obtained. A 5-mm right lower quadrant port was then placed under direct visualization.  A survey of the patient's pelvis and abdomen revealed the findings as above.  Biopsies were taken of the bilateral pelvic sidewalls and the vaginal cuff.   There was slight oozing noted from the biopsy site along the vaginal cuff and left pelvic sidewall, this was treated using the Kleppingers.  Good hemostasis was then achieved.  All trocars were removed under direct visualization, and the abdomen which was desufflated.    All skin incisions were closed with 4-0 Monocryl subcuticular stitches. A total of 10 ml of 0.5% Sensorcaine was injected into the incisions for local analgesia. Dermabond was placed over the incisions. The patient tolerated the procedures well.  All instruments, needles, and sponge counts were correct x 2. The patient was taken to the recovery room awake, extubated and in stable condition.    Estimated Blood Loss:  < 5 ml      Drains: straight catheterization prior to procedure with  110 ml of clear urine         Total IV Fluids:  500 ml  Specimens: Biopsies of bilateral pelvic sidewalls and vaginal cuff         Implants: None         Complications:  None; patient tolerated the procedure well.         Disposition: PACU - hemodynamically stable.         Condition: stable   Hildred Laser, MD [facility]

## 2022-08-29 ENCOUNTER — Encounter: Payer: 59 | Admitting: Obstetrics and Gynecology

## 2022-08-29 LAB — SURGICAL PATHOLOGY

## 2022-09-01 LAB — OPIATE, QUANTITATIVE, URINE
OXYCODONE+OXYMORPHONE UR QL SCN: POSITIVE — AB
OXYMORPHONE (GC/MS): 599 ng/mL
Opiates: NEGATIVE
Oxycodone, Conf, Urine: POSITIVE — AB
Oxycodone, ur: 234 ng/mL
Oxymorphone: POSITIVE — AB

## 2022-09-04 ENCOUNTER — Encounter: Payer: Self-pay | Admitting: Obstetrics and Gynecology

## 2022-09-04 ENCOUNTER — Other Ambulatory Visit: Payer: Self-pay | Admitting: Obstetrics and Gynecology

## 2022-09-05 ENCOUNTER — Encounter: Payer: 59 | Admitting: Obstetrics and Gynecology

## 2022-09-05 ENCOUNTER — Other Ambulatory Visit: Payer: Self-pay

## 2022-09-05 MED ORDER — TRAMADOL HCL 50 MG PO TABS: 100.0000 mg | ORAL_TABLET | Freq: Four times a day (QID) | ORAL | 0 refills | Status: DC | PRN

## 2022-09-10 DIAGNOSIS — B001 Herpesviral vesicular dermatitis: Secondary | ICD-10-CM | POA: Diagnosis not present

## 2022-09-10 DIAGNOSIS — Z6823 Body mass index (BMI) 23.0-23.9, adult: Secondary | ICD-10-CM | POA: Diagnosis not present

## 2022-09-12 ENCOUNTER — Telehealth: Payer: Self-pay

## 2022-09-12 ENCOUNTER — Encounter: Payer: 59 | Admitting: Obstetrics and Gynecology

## 2022-09-12 NOTE — Telephone Encounter (Signed)
Reviewed your email with [name]. He said you can come to the clinic & we can put a lidocaine patch on the site that you can wear for 24 hours. [name] said there isn't a cream that will help. Also, it's OK for you to work.  Your clothes provide a barrier between you & your co-worker & you've been on the medication for 2 days.    [name], RN  [facility]  [address]  [place], [place]  [address]  P:  [phone]  F:  [phone]  [email or web address]     From: [name] [email or web address]> Sent: Tuesday, Sep 12, 2022 1:08 PM To: [name] [email or web address]> Subject: FW: Question for [name] [facility]  [name]  [place], [place] [address]  [phone]  [phone] FAX  [email or web address]  [email or web address]     From: [name] [email or web address]> Sent: Tuesday, Sep 12, 2022 12:11 PM To: [name] @[email or web address]> Subject: Question for [name] afternoon [name], my mychart had to be redownloaded since I got a new phone and I'm unable to send him a message. I was hoping I could send this to you and you forward it to him.  Since last Thursday, I've been having this very bad pain in my right arm pit area and now it's spread over my right side of my chest and right shoulder blade as well as down the right side of my abdomen. It hurts for my clothes to even touch it.    I ended up going to an urgent care on Sunday due to the pain continuing to be really bad and Sunday morning I had a rash come up on the right side of my chest. They ended up telling me it was shingles and prescribed Valacyclovir. Monday morning the rash has spread to two additional places under my arm. The rash and other areas of the skin on the right side of my body are in so much pain. Is there a cream or  anything you can prescribe to help with that? Also, I ended up missing work Sunday and they are wanting to know if I'll need to miss additional days. I heard that I'm not supposed to be around people that are pregnant and one of the girls on my shift at work is pregnant. I'll attach the pictures of the rash I currently have. I don't know how contagious this is as this is the first time I've had it.   Thank you,  [name]

## 2022-09-13 ENCOUNTER — Encounter: Payer: Self-pay | Admitting: Obstetrics and Gynecology

## 2022-09-13 ENCOUNTER — Other Ambulatory Visit: Payer: Self-pay | Admitting: Obstetrics and Gynecology

## 2022-09-14 MED ORDER — OXYCODONE-ACETAMINOPHEN 5-325 MG PO TABS: 1.0000 | ORAL_TABLET | Freq: Four times a day (QID) | ORAL | 0 refills | Status: DC | PRN

## 2022-09-18 ENCOUNTER — Encounter: Payer: Self-pay | Admitting: Obstetrics and Gynecology

## 2022-09-18 ENCOUNTER — Other Ambulatory Visit: Payer: Self-pay | Admitting: Obstetrics and Gynecology

## 2022-09-19 ENCOUNTER — Other Ambulatory Visit: Payer: Self-pay | Admitting: Obstetrics and Gynecology

## 2022-09-19 DIAGNOSIS — Z7689 Persons encountering health services in other specified circumstances: Secondary | ICD-10-CM

## 2022-09-19 MED ORDER — TRAMADOL HCL 50 MG PO TABS: 100.0000 mg | ORAL_TABLET | Freq: Four times a day (QID) | ORAL | 0 refills | Status: DC | PRN

## 2022-09-28 ENCOUNTER — Encounter: Payer: 59 | Admitting: Obstetrics and Gynecology

## 2022-09-29 ENCOUNTER — Telehealth: Payer: Self-pay | Admitting: Family Medicine

## 2022-09-29 NOTE — Progress Notes (Unsigned)
    OBSTETRICS/GYNECOLOGY POST-OPERATIVE CLINIC VISIT  Subjective:     [name] is a 42 y.o. female who presents to the clinic 4 weeks status post  Operative Laparoscopy with Biopsies  for Pelvic pain, History of endometriosis . Eating a regular diet {with-without:5700} difficulty. Bowel movements are {normal/abnormal***:19619}. {pain control:13522::"The patient is not having any pain."}  {Common ambulatory SmartLinks:19316}  Review of Systems {ros; complete:30496}   Objective:   LMP 10/15/2013  There is no height or weight on file to calculate BMI.  General:  alert and no distress  Abdomen: soft, bowel sounds active, non-tender  Incision:   {incision:13716::"no dehiscence","incision well approximated","healing well","no drainage","no erythema","no hernia","no seroma","no swelling"}    Pathology:    Assessment:   Patient s/p Operative Laparoscopy with Biopsies (surgery)  {doing well:13525::"Doing well postoperatively."}   Plan:   1. Continue any current medications as instructed by provider. 2. Wound care discussed. 3. Operative findings again reviewed. Pathology report discussed. 4. Activity restrictions: {restrictions:13723} 5. Anticipated return to work: {work return:14002}. 6. Follow up: {1-61:09604} {time; units:18646} for ***    Hildred Laser, MD [facility]

## 2022-09-29 NOTE — Telephone Encounter (Signed)
Patient would like to know if Dr Para March will take her on as a new patient.He already sees her 2 daughters [place] and [place].

## 2022-10-01 NOTE — Telephone Encounter (Signed)
Yes, please schedule next physical with me.  Thanks.

## 2022-10-02 NOTE — Telephone Encounter (Signed)
Lvmtcb

## 2022-10-03 ENCOUNTER — Encounter: Payer: Self-pay | Admitting: Obstetrics and Gynecology

## 2022-10-03 ENCOUNTER — Ambulatory Visit (INDEPENDENT_AMBULATORY_CARE_PROVIDER_SITE_OTHER): Payer: 59 | Admitting: Obstetrics and Gynecology

## 2022-10-03 ENCOUNTER — Other Ambulatory Visit: Payer: Self-pay | Admitting: Obstetrics and Gynecology

## 2022-10-03 VITALS — BP 102/61 | HR 75 | Resp 16 | Ht 65.0 in | Wt 143.5 lb

## 2022-10-03 DIAGNOSIS — G8929 Other chronic pain: Secondary | ICD-10-CM

## 2022-10-03 DIAGNOSIS — R102 Pelvic and perineal pain: Secondary | ICD-10-CM

## 2022-10-03 DIAGNOSIS — Z90710 Acquired absence of both cervix and uterus: Secondary | ICD-10-CM

## 2022-10-03 DIAGNOSIS — Z8742 Personal history of other diseases of the female genital tract: Secondary | ICD-10-CM | POA: Diagnosis not present

## 2022-10-03 DIAGNOSIS — M6289 Other specified disorders of muscle: Secondary | ICD-10-CM

## 2022-10-03 MED ORDER — TRAMADOL HCL 50 MG PO TABS: 100.0000 mg | ORAL_TABLET | Freq: Four times a day (QID) | ORAL | 0 refills | Status: DC | PRN

## 2022-10-09 ENCOUNTER — Encounter: Payer: Self-pay | Admitting: Obstetrics and Gynecology

## 2022-10-10 DIAGNOSIS — Z0000 Encounter for general adult medical examination without abnormal findings: Secondary | ICD-10-CM

## 2022-10-10 DIAGNOSIS — E8881 Metabolic syndrome: Secondary | ICD-10-CM

## 2022-10-11 ENCOUNTER — Other Ambulatory Visit: Payer: Self-pay | Admitting: Obstetrics and Gynecology

## 2022-10-13 ENCOUNTER — Other Ambulatory Visit: Payer: Self-pay | Admitting: Obstetrics and Gynecology

## 2022-10-13 ENCOUNTER — Encounter: Payer: Self-pay | Admitting: Obstetrics and Gynecology

## 2022-10-16 MED ORDER — TRAMADOL HCL 50 MG PO TABS: 100.0000 mg | ORAL_TABLET | Freq: Four times a day (QID) | ORAL | 0 refills | Status: DC | PRN

## 2022-10-25 ENCOUNTER — Encounter: Payer: Self-pay | Admitting: Family Medicine

## 2022-10-25 ENCOUNTER — Encounter: Payer: Self-pay | Admitting: Obstetrics and Gynecology

## 2022-10-27 ENCOUNTER — Other Ambulatory Visit: Payer: Self-pay | Admitting: Obstetrics and Gynecology

## 2022-10-30 ENCOUNTER — Other Ambulatory Visit: Payer: Self-pay | Admitting: Obstetrics and Gynecology

## 2022-10-30 MED ORDER — TRAMADOL HCL 50 MG PO TABS: 100.0000 mg | ORAL_TABLET | Freq: Four times a day (QID) | ORAL | 0 refills | Status: DC | PRN

## 2022-11-08 ENCOUNTER — Other Ambulatory Visit: Payer: Self-pay | Admitting: Obstetrics and Gynecology

## 2022-11-08 ENCOUNTER — Encounter: Payer: Self-pay | Admitting: Obstetrics and Gynecology

## 2022-11-09 MED ORDER — TRAMADOL HCL 50 MG PO TABS: 100.0000 mg | ORAL_TABLET | Freq: Four times a day (QID) | ORAL | 0 refills | Status: DC | PRN

## 2022-11-18 ENCOUNTER — Other Ambulatory Visit: Payer: Self-pay

## 2022-11-19 ENCOUNTER — Other Ambulatory Visit: Payer: Self-pay

## 2022-11-20 ENCOUNTER — Other Ambulatory Visit: Payer: Self-pay | Admitting: Obstetrics and Gynecology

## 2022-11-20 ENCOUNTER — Other Ambulatory Visit: Payer: Self-pay

## 2022-11-20 MED ORDER — MOUNJARO 10 MG/0.5ML ~~LOC~~ SOAJ
10.0000 mg | SUBCUTANEOUS | 5 refills | Status: DC
  Filled 2022-11-20: qty 2, 28d supply, fill #0
  Filled 2022-12-19 – 2023-01-02 (×2): qty 2, 28d supply, fill #1
  Filled 2023-02-02 – 2023-02-19 (×2): qty 2, 28d supply, fill #2

## 2022-11-21 MED ORDER — TRAMADOL HCL 50 MG PO TABS: 100.0000 mg | ORAL_TABLET | Freq: Four times a day (QID) | ORAL | 0 refills | Status: DC | PRN

## 2022-11-22 ENCOUNTER — Other Ambulatory Visit: Payer: Self-pay

## 2022-11-27 ENCOUNTER — Encounter: Payer: Self-pay | Admitting: Family Medicine

## 2022-11-30 ENCOUNTER — Ambulatory Visit: Payer: 59 | Admitting: Family Medicine

## 2022-12-01 ENCOUNTER — Encounter: Payer: Self-pay | Admitting: Family Medicine

## 2022-12-01 ENCOUNTER — Other Ambulatory Visit: Payer: 59

## 2022-12-01 DIAGNOSIS — Z1152 Encounter for screening for COVID-19: Secondary | ICD-10-CM

## 2022-12-01 LAB — POC COVID19 BINAXNOW: SARS Coronavirus 2 Ag: NEGATIVE

## 2022-12-01 NOTE — Progress Notes (Signed)
Pt presents today with starting this morning of sneezing, runny nose and scratchy throat. [name]

## 2022-12-03 ENCOUNTER — Encounter: Payer: Self-pay | Admitting: Physician Assistant

## 2022-12-04 ENCOUNTER — Other Ambulatory Visit: Payer: Self-pay | Admitting: Physician Assistant

## 2022-12-04 MED ORDER — PSEUDOEPHEDRINE HCL ER 120 MG PO TB12: 120.0000 mg | ORAL_TABLET | Freq: Two times a day (BID) | ORAL | 0 refills | Status: DC

## 2022-12-04 MED ORDER — NIRMATRELVIR/RITONAVIR (PAXLOVID)TABLET: 3.0000 | ORAL_TABLET | Freq: Two times a day (BID) | ORAL | 0 refills | Status: AC

## 2022-12-04 MED ORDER — HYDROCOD POLI-CHLORPHE POLI ER 10-8 MG/5ML PO SUER: 5.0000 mL | Freq: Two times a day (BID) | ORAL | 0 refills | Status: DC | PRN

## 2022-12-06 ENCOUNTER — Other Ambulatory Visit: Payer: Self-pay | Admitting: Obstetrics and Gynecology

## 2022-12-07 MED ORDER — TRAMADOL HCL 50 MG PO TABS: 100.0000 mg | ORAL_TABLET | Freq: Four times a day (QID) | ORAL | 0 refills | Status: DC | PRN

## 2022-12-13 ENCOUNTER — Ambulatory Visit: Payer: 59

## 2022-12-19 ENCOUNTER — Other Ambulatory Visit: Payer: Self-pay

## 2022-12-19 DIAGNOSIS — E119 Type 2 diabetes mellitus without complications: Secondary | ICD-10-CM | POA: Diagnosis not present

## 2022-12-19 DIAGNOSIS — H52223 Regular astigmatism, bilateral: Secondary | ICD-10-CM | POA: Diagnosis not present

## 2022-12-19 DIAGNOSIS — H5213 Myopia, bilateral: Secondary | ICD-10-CM | POA: Diagnosis not present

## 2022-12-20 ENCOUNTER — Other Ambulatory Visit: Payer: Self-pay | Admitting: Physician Assistant

## 2022-12-20 ENCOUNTER — Other Ambulatory Visit: Payer: Self-pay | Admitting: Obstetrics and Gynecology

## 2022-12-20 MED ORDER — TRAMADOL HCL 50 MG PO TABS: 100.0000 mg | ORAL_TABLET | Freq: Four times a day (QID) | ORAL | 0 refills | Status: DC | PRN

## 2023-01-01 ENCOUNTER — Encounter: Payer: Self-pay | Admitting: Obstetrics and Gynecology

## 2023-01-01 ENCOUNTER — Other Ambulatory Visit: Payer: Self-pay | Admitting: Obstetrics and Gynecology

## 2023-01-02 ENCOUNTER — Other Ambulatory Visit: Payer: Self-pay

## 2023-01-02 MED ORDER — OXYCODONE-ACETAMINOPHEN 5-325 MG PO TABS: 1.0000 | ORAL_TABLET | Freq: Four times a day (QID) | ORAL | 0 refills | Status: DC | PRN

## 2023-01-03 MED ORDER — TRAMADOL HCL 50 MG PO TABS: 100.0000 mg | ORAL_TABLET | Freq: Four times a day (QID) | ORAL | 0 refills | Status: DC | PRN

## 2023-01-08 ENCOUNTER — Encounter: Payer: Self-pay | Admitting: Obstetrics and Gynecology

## 2023-01-08 ENCOUNTER — Encounter: Payer: Self-pay | Admitting: Family Medicine

## 2023-01-10 ENCOUNTER — Encounter: Payer: Self-pay | Admitting: Physical Therapy

## 2023-01-10 ENCOUNTER — Ambulatory Visit: Payer: 59 | Attending: Obstetrics and Gynecology | Admitting: Physical Therapy

## 2023-01-10 ENCOUNTER — Other Ambulatory Visit: Payer: Self-pay | Admitting: Family Medicine

## 2023-01-10 DIAGNOSIS — Z90710 Acquired absence of both cervix and uterus: Secondary | ICD-10-CM | POA: Diagnosis not present

## 2023-01-10 DIAGNOSIS — M6289 Other specified disorders of muscle: Secondary | ICD-10-CM | POA: Diagnosis not present

## 2023-01-10 DIAGNOSIS — R102 Pelvic and perineal pain: Secondary | ICD-10-CM | POA: Diagnosis not present

## 2023-01-10 DIAGNOSIS — M5459 Other low back pain: Secondary | ICD-10-CM | POA: Insufficient documentation

## 2023-01-10 DIAGNOSIS — R293 Abnormal posture: Secondary | ICD-10-CM | POA: Diagnosis not present

## 2023-01-10 DIAGNOSIS — Z8742 Personal history of other diseases of the female genital tract: Secondary | ICD-10-CM | POA: Diagnosis not present

## 2023-01-10 DIAGNOSIS — R2689 Other abnormalities of gait and mobility: Secondary | ICD-10-CM | POA: Insufficient documentation

## 2023-01-10 DIAGNOSIS — Z90721 Acquired absence of ovaries, unilateral: Secondary | ICD-10-CM | POA: Insufficient documentation

## 2023-01-10 DIAGNOSIS — G8929 Other chronic pain: Secondary | ICD-10-CM | POA: Insufficient documentation

## 2023-01-10 DIAGNOSIS — M533 Sacrococcygeal disorders, not elsewhere classified: Secondary | ICD-10-CM | POA: Insufficient documentation

## 2023-01-10 MED ORDER — LOSARTAN POTASSIUM 100 MG PO TABS: 100.0000 mg | ORAL_TABLET | Freq: Every day | ORAL | 0 refills | Status: DC

## 2023-01-10 NOTE — Therapy (Signed)
OUTPATIENT PHYSICAL THERAPY EVALUATION   Patient Name: [name] MRN: [id number] DOB:1981/02/01, 42 y.o., female Today's Date: 01/10/2023   PT End of Session - 01/10/23 1640     Visit Number 1    Number of Visits 10    Date for PT Re-Evaluation 03/21/23    PT Start Time 1632    PT Stop Time 1735    PT Time Calculation (min) 63 min    Activity Tolerance Patient tolerated treatment well;No increased pain    Behavior During Therapy WFL for tasks assessed/performed               Past Medical History:  Diagnosis Date   Abnormal Pap smear    Anxiety    Diet-controlled hyperlipidemia    Dyshidrotic eczema    Endometriosis    Family history of hemochromatosis    mother    HA (headache)    otc med prn   Hypertension    Migraines    PONV (postoperative nausea and vomiting)    Pregnancy induced hypertension 2008   Type 2 diabetes mellitus treated with insulin (HCC)    Past Surgical History:  Procedure Laterality Date   ABLATION ON ENDOMETRIOSIS N/A 06/04/2014   Procedure: ABLATION ON ENDOMETRIOSIS;  Surgeon: Leslie Andrea, MD;  Location: [facility];  Service: Gynecology;  Laterality: N/A;   BIOPSY  12/01/2011   Procedure: BIOPSY;  Surgeon: Leslie Andrea, MD;  Location: [facility];  Service: Gynecology;  Laterality: N/A;   CESAREAN SECTION  2008   CESAREAN SECTION WITH BILATERAL TUBAL LIGATION N/A 12/02/2012   Procedure: Repeat Cesarean Section Delivery Baby Girl @ 1826, Apgars 8/9, Bilateral Tubal Ligation;  Surgeon: Leslie Andrea, MD;  Location: [facility];  Service: Obstetrics;  Laterality: N/A;   COLPOSCOPY     as teenager, results WNL   ENDOMETRIAL BIOPSY     LAPAROSCOPIC ASSISTED VAGINAL HYSTERECTOMY  2016   with bilateral salpingectomy   LAPAROSCOPIC BILATERAL SALPINGECTOMY Bilateral 02/15/2015   Procedure: LAPAROSCOPIC BILATERAL SALPINGECTOMY;  Surgeon: Hildred Laser, MD;  Location: [facility];  Service: Gynecology;  Laterality: Bilateral;    LAPAROSCOPIC LYSIS OF ADHESIONS N/A 06/04/2014   Procedure: LAPAROSCOPIC LYSIS OF ADHESIONS;  Surgeon: Leslie Andrea, MD;  Location: [facility];  Service: Gynecology;  Laterality: N/A;   LAPAROSCOPIC SALPINGO OOPHERECTOMY Right 07/17/2016   Procedure: LAPAROSCOPIC RIGHT SALPINGO OOPHORECTOMY;  Surgeon: Hildred Laser, MD;  Location: [facility];  Service: Gynecology;  Laterality: Right;   LAPAROSCOPY  12/01/2011   Procedure: LAPAROSCOPY OPERATIVE;  Surgeon: Leslie Andrea, MD;  Location: [facility];  Service: Gynecology;  Laterality: N/A;   LAPAROSCOPY N/A 06/04/2014   Procedure: LAPAROSCOPY OPERATIVE;  Surgeon: Leslie Andrea, MD;  Location: [facility];  Service: Gynecology;  Laterality: N/A;   LAPAROSCOPY N/A 02/14/2016   Procedure: LAPAROSCOPY DIAGNOSTIC WITH BIOPSIES POSSIBLE ADHESIOLYSIS;  Surgeon: Hildred Laser, MD;  Location: [facility];  Service: Gynecology;  Laterality: N/A;   LAPAROSCOPY N/A 05/26/2016   Procedure: LAPAROSCOPY operative with lysis of adhesions;  Surgeon: Linzie Collin, MD;  Location: [facility];  Service: Gynecology;  Laterality: N/A;   OOPHORECTOMY Left    TONSILLECTOMY     TUBAL LIGATION     TYMPANOSTOMY TUBE PLACEMENT     Patient Active Problem List   Diagnosis Date Noted   Dyshidrotic eczema 12/17/2018   Subclinical hypothyroidism 12/17/2018   Subungual hematoma of toe 12/17/2018   Class 2 severe obesity due to excess  calories with serious comorbidity and body mass index (BMI) of 37.0 to 37.9 in adult [facility]) 11/27/2018   Family history of hemochromatosis 11/27/2018   Multiple drug allergies 11/27/2018   Osteoarthritis of spine with radiculopathy, lumbar region 06/20/2016   H/O abdominal surgery 02/09/2016   Chronic pelvic pain in female 11/30/2014   Endometriosis 11/30/2014   Hypertension 09/01/2014   Cyst of ovary 09/01/2014   Class 2 obesity 09/01/2014   Anxiety state 12/11/2013   Routine general medical examination at a health care facility 04/06/2011    Metabolic syndrome 04/06/2011   Insomnia 12/20/2007   ALLERGIC RHINITIS 07/18/2007   Other hyperlipidemia 02/19/2007    PCP: Terance Hart   REFERRING PROVIDER: Neysa Bonito DIAG:  R10.2,G89.29 (ICD-10-CM) - Chronic pelvic pain in female  Z90.710,Z90.721 (ICD-10-CM) - S/P hysterectomy with oophorectomy  M62.89 (ICD-10-CM) - Pelvic floor dysfunction in female  Z6.42 (ICD-10-CM) - History of endometriosis   Rationale for Evaluation and Treatment Rehabilitation  THERAPY DIAG:  Sacrococcygeal disorders, not elsewhere classified  Other abnormalities of gait and mobility  Other low back pain   ONSET DATE:   SUBJECTIVE:                                                                                                                                                                                           SUBJECTIVE STATEMENT: 1) pelvic pain   pt has had ovary cyst pain in the past. Pain was resolved when the ovaries were removed.  Pain seemed to still return after the hysterectomy/ surgeries. Located B groin.which sometimes would radiate to her thighs when she had periods. 8/10 pain, occurring 5/7 days   Not dependent on activities. Pt can feel when sitting still.  Pt sits for 12 hours at her work, with breaks.  Laparoscopic surgery every 2-3 years for endometriosis across 12 years.   C sections x 2, hysterectomy 2016   2) constipation: BMs occur 3-4 days . Pt notices she was more regular with BMs prior to hysterectomy. Stool type 3-4 usually. Pt went the ER in April 2024 for compacted stools.  Daily fluid: "I am not a water drinker", 16 fl oz of water,  48 fl of ginger ale, 8 oz of coffee   3) CLBP on the middle to R side of spine ( pt points to area of iliac rest) : 5/10  non radiating. Started after having kids. Worsens with chores    PERTINENT HISTORY:  Hx of endometriosis, OA of lumbar spine, C sections x 2, hysterectomy 2016  Intentional weight Loss 100 lbs across 2 years   Pt tried to schedule with pain  clinic but her insurance was not covered  PAIN:  Are you having pain? Yes: see above   PRECAUTIONS: None  WEIGHT BEARING RESTRICTIONS: No  FALLS:  Has patient fallen in last 6 months? 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  PLOF: Independent  PATIENT GOALS:  Decrease pelvic pain     OBJECTIVE:    [facility] PT Assessment - 01/10/23 1721       Strength   Overall Strength Comments L LE 3/5, RLE 3+/5,      Palpation   SI assessment  L shoulder, iliac crest lower ,      Ambulation/Gait   Gait Comments 1.19 m/s excessive sway to R, decreased stance to L, minimal trunk rotation posterior to L , R medial arch collapse             [facility] Adult PT Treatment/Exercise - 01/10/23 1721       Therapeutic Activites    Therapeutic Activities Other Therapeutic Activities    Other Therapeutic Activities explained pain science, biopsychosocial approaches      Neuro Re-ed    Neuro Re-ed Details  cued for sitting posture, and sit to stand              HOME EXERCISE PROGRAM: See pt instruction section    ASSESSMENT:  CLINICAL IMPRESSION:    Pt is a  42  yo  who presents with pelvic pain,  constipation, CLBP   which impact QOL, ADL, fitness, and community activities.   Pt's musculoskeletal assessment revealed uneven pelvic girdle and shoulder height, asymmetries to gait pattern, limited spinal /pelvic mobility, dyscoordination and strength of pelvic floor mm, hip weakness, poor body mechanics which places strain on the abdominal/pelvic floor mm. These are deficits that indicate an ineffective intraabdominal pressure system associated with increased risk for pt's Sx.   Pt was provided education on etiology of Sx with anatomy, physiology explanation with images along with the benefits of customized pelvic PT Tx based on pt's  medical conditions and musculoskeletal deficits.  Explained the physiology of deep core mm coordination and roles of pelvic floor function in urination, defecation, sexual function, and postural control with deep core mm system.   Regional interdependent approaches will yield greater benefits in pt's POC.  Following Tx today which pt tolerated without complaints,  pt demo'd proper body mechanics to minimize straining pelvic floor.  Provided education for biopsychosocial approaches for pain management.   Plan to address realignment of spine/ pelvis at next session to help promote optimize IAP system for improved pelvic floor function, trunk stability, gait, balance, stabilization with mobility tasks.  Plan to address pelvic floor issues once pelvis and spine are realigned to yield better outcomes.   Pt benefits from skilled PT.    OBJECTIVE IMPAIRMENTS decreased activity tolerance, decreased coordination, decreased endurance, decreased mobility, difficulty walking, decreased ROM, decreased strength, decreased safety awareness, hypomobility, increased muscle spasms, impaired flexibility, improper body mechanics, postural dysfunction, and pain. scar restrictions   ACTIVITY LIMITATIONS  self-care,  sleep, home chores, work tasks   PARTICIPATION LIMITATIONS:  community, gym activities    PERSONAL FACTORS        are also affecting patient's functional outcome.    REHAB POTENTIAL: Good   CLINICAL DECISION MAKING: Evolving/moderate complexity   EVALUATION COMPLEXITY: Moderate    PATIENT EDUCATION:    Education details: Showed pt anatomy images.  Explained muscles attachments/ connection, physiology of deep core system/ spinal- thoracic-pelvis-lower kinetic chain as they relate to pt's presentation, Sx, and past Hx. Explained what and how these areas of deficits need to be restored to balance and function    See Therapeutic activity / neuromuscular re-education section  Answered pt's  questions.   Person educated: Patient Education method: Explanation, Demonstration, Tactile cues, Verbal cues, and Handouts Education comprehension: verbalized understanding, returned demonstration, verbal cues required, tactile cues required, and needs further education     PLAN: PT FREQUENCY: 1x/week   PT DURATION: 10 weeks   PLANNED INTERVENTIONS: Therapeutic exercises, Therapeutic activity, Neuromuscular re-education, Balance training, Gait training, Patient/Family education, Self Care, Joint mobilization, Spinal mobilization, Moist heat, Taping, and Manual therapy, dry needling.   PLAN FOR NEXT SESSION: See clinical impression for plan     GOALS: Goals reviewed with patient? Yes  SHORT TERM GOALS: Target date: .wplus4  Pt will demo IND with HEP                    Baseline: Not IND            Goal status: INITIAL   LONG TERM GOALS: Target date: 03/21/2023    1.Pt will demo proper deep core coordination without chest breathing and optimal excursion of diaphragm/pelvic floor in order to promote spinal stability and pelvic floor function  Baseline: dyscoordination Goal status: INITIAL  2.  Pt will demo > 5 pt change on FOTO  to improve QOL and function    Pelvic Pain baseline -25  Lower score = better function   Lumber baseline  -  Higher score = better function   Goal status: INITIAL  3.  Pt will demo proper body mechanics in against gravity tasks and ADLs  work tasks, fitness  to minimize straining pelvic floor / back    Baseline: not IND, improper form that places strain on pelvic floor  Goal status: INITIAL    4. Pt will demo increased gait speed > 1.3 m/s with reciprocal gait pattern, longer stride length  in order to ambulate safely in community and return to fitness routine  Baseline: 1.19 m/s excessive sway to R, decreased stance to L, minimal trunk rotation posterior to L , R medial arch collapse  Goal status: INITIAL    5. Pt will demo decreased  C section scar restrictions to  progress to deep core HEP postural stability and decreasing pelvic pain and LBP   Baseline: C section scar restrictions  Goal status: INITIAL   6. Pt will report decreased B groin pain less than 3 days out week in order to participate in more social and mothering activities with kids  Baseline: groin pain  8/10 pain, occurring 5/7 days Goal status: INITIAL   7. Pt will report: increase water intake from 16 fl oz to > 48 fl oz  BMs occurring daily to every other day instead of every  3-4 days    Baseline:   BMs occur every 3-4 x  a week  Daily fluid intake:  water _16_  fl oz,  Goal status: INITIAL     8.Pt will demo levelled pelvic girdle and shoulder height in order to progress to deep core strengthening HEP and restore mobility at spine, pelvis, gait, posture minimize falls, and improve balance   Baseline: L shoulder, iliac crest lowered Goal Status: INITIAL     Mariane Masters, PT 01/10/2023, 5:06 PM

## 2023-01-10 NOTE — Patient Instructions (Signed)
  Proper body mechanics with getting out of a chair to decrease strain  on back &pelvic floor   Avoid holding your breath when Getting out of the chair:  Scoot to front part of chair chair Heels behind knees, feet are hip width apart, nose over toes  Inhale like you are smelling roses Exhale to stand   ___  Sitting with feet on ground   ___ Gratitude journal

## 2023-01-18 ENCOUNTER — Ambulatory Visit: Payer: 59 | Admitting: Physical Therapy

## 2023-01-19 ENCOUNTER — Telehealth: Payer: Self-pay | Admitting: Physical Therapy

## 2023-01-19 ENCOUNTER — Ambulatory Visit: Payer: 59 | Attending: Obstetrics and Gynecology | Admitting: Physical Therapy

## 2023-01-19 NOTE — Telephone Encounter (Signed)
Physical therapist called pt re: missed appt today. Provided encouragement to keep up with HEP.  Left message that there is no charge to missed appts but there is a waiting list with other patients needing treatment at our clinic.    We would be grateful if pt can please call to confirm whether they wish to keep or cancel remaining appts [phone].  Remaining appts will be removed after 2 no-show appts per rehab department policy.   If pt wants to schedule more appts, they will be scheduled one at a time.

## 2023-01-24 ENCOUNTER — Other Ambulatory Visit: Payer: Self-pay | Admitting: Obstetrics and Gynecology

## 2023-01-25 ENCOUNTER — Ambulatory Visit: Payer: 59 | Admitting: Physical Therapy

## 2023-01-25 MED ORDER — TRAMADOL HCL 50 MG PO TABS: 100.0000 mg | ORAL_TABLET | Freq: Four times a day (QID) | ORAL | 0 refills | Status: DC | PRN

## 2023-02-01 ENCOUNTER — Ambulatory Visit: Payer: 59 | Admitting: Physical Therapy

## 2023-02-02 ENCOUNTER — Other Ambulatory Visit: Payer: Self-pay | Admitting: Obstetrics and Gynecology

## 2023-02-03 MED ORDER — OXYCODONE-ACETAMINOPHEN 5-325 MG PO TABS: 1.0000 | ORAL_TABLET | Freq: Four times a day (QID) | ORAL | 0 refills | Status: DC | PRN

## 2023-02-07 ENCOUNTER — Other Ambulatory Visit: Payer: Self-pay | Admitting: Obstetrics and Gynecology

## 2023-02-07 MED ORDER — TRAMADOL HCL 50 MG PO TABS: 100.0000 mg | ORAL_TABLET | Freq: Four times a day (QID) | ORAL | 0 refills | Status: DC | PRN

## 2023-02-08 ENCOUNTER — Ambulatory Visit: Payer: 59 | Admitting: Physical Therapy

## 2023-02-14 ENCOUNTER — Other Ambulatory Visit: Payer: Self-pay

## 2023-02-15 ENCOUNTER — Ambulatory Visit: Payer: 59 | Admitting: Physical Therapy

## 2023-02-16 ENCOUNTER — Encounter: Payer: Self-pay | Admitting: Obstetrics and Gynecology

## 2023-02-16 ENCOUNTER — Other Ambulatory Visit: Payer: Self-pay | Admitting: Obstetrics and Gynecology

## 2023-02-16 MED ORDER — KETOROLAC TROMETHAMINE 10 MG PO TABS: 10.0000 mg | ORAL_TABLET | Freq: Four times a day (QID) | ORAL | 0 refills | Status: DC | PRN

## 2023-02-20 ENCOUNTER — Other Ambulatory Visit: Payer: Self-pay

## 2023-02-22 ENCOUNTER — Encounter: Payer: Self-pay | Admitting: Obstetrics and Gynecology

## 2023-02-22 ENCOUNTER — Other Ambulatory Visit: Payer: Self-pay | Admitting: Obstetrics and Gynecology

## 2023-02-22 ENCOUNTER — Ambulatory Visit: Payer: 59 | Admitting: Physical Therapy

## 2023-02-22 ENCOUNTER — Encounter: Payer: Self-pay | Admitting: Family Medicine

## 2023-02-22 ENCOUNTER — Ambulatory Visit: Payer: 59 | Admitting: Family Medicine

## 2023-02-22 VITALS — BP 106/60 | HR 82 | Temp 98.4°F | Ht 65.0 in | Wt 137.6 lb

## 2023-02-22 DIAGNOSIS — E119 Type 2 diabetes mellitus without complications: Secondary | ICD-10-CM | POA: Diagnosis not present

## 2023-02-22 DIAGNOSIS — J309 Allergic rhinitis, unspecified: Secondary | ICD-10-CM

## 2023-02-22 DIAGNOSIS — Z23 Encounter for immunization: Secondary | ICD-10-CM | POA: Diagnosis not present

## 2023-02-22 DIAGNOSIS — Z794 Long term (current) use of insulin: Secondary | ICD-10-CM

## 2023-02-22 DIAGNOSIS — H93A9 Pulsatile tinnitus, unspecified ear: Secondary | ICD-10-CM | POA: Diagnosis not present

## 2023-02-22 DIAGNOSIS — Z7189 Other specified counseling: Secondary | ICD-10-CM

## 2023-02-22 DIAGNOSIS — Z7984 Long term (current) use of oral hypoglycemic drugs: Secondary | ICD-10-CM | POA: Diagnosis not present

## 2023-02-22 DIAGNOSIS — G8929 Other chronic pain: Secondary | ICD-10-CM

## 2023-02-22 DIAGNOSIS — Z7985 Long-term (current) use of injectable non-insulin antidiabetic drugs: Secondary | ICD-10-CM

## 2023-02-22 MED ORDER — HYDROCODONE BIT-HOMATROP MBR 5-1.5 MG/5ML PO SOLN: 5.0000 mL | Freq: Three times a day (TID) | ORAL | 0 refills | Status: DC | PRN

## 2023-02-22 MED ORDER — FLUTICASONE PROPIONATE 50 MCG/ACT NA SUSP: 2.0000 | Freq: Every day | NASAL | Status: DC | PRN

## 2023-02-22 MED ORDER — TRAMADOL HCL 50 MG PO TABS: 100.0000 mg | ORAL_TABLET | Freq: Four times a day (QID) | ORAL | 0 refills | Status: DC | PRN

## 2023-02-22 NOTE — Patient Instructions (Addendum)
Go to the lab on the way out.   If you have mychart we'll likely use that to update you.    Take care.  Glad to see you. Flu shot today.
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
## 2023-02-23 LAB — COMPREHENSIVE METABOLIC PANEL WITH GFR
ALT: 16 U/L (ref 0–35)
AST: 14 U/L (ref 0–37)
Albumin: 4.5 g/dL (ref 3.5–5.2)
Alkaline Phosphatase: 44 U/L (ref 39–117)
BUN: 13 mg/dL (ref 6–23)
CO2: 31 meq/L (ref 19–32)
Calcium: 10 mg/dL (ref 8.4–10.5)
Chloride: 100 meq/L (ref 96–112)
Creatinine, Ser: 0.64 mg/dL (ref 0.40–1.20)
GFR: 110.04 mL/min
Glucose, Bld: 64 mg/dL — ABNORMAL LOW (ref 70–99)
Potassium: 3.6 meq/L (ref 3.5–5.1)
Sodium: 139 meq/L (ref 135–145)
Total Bilirubin: 0.5 mg/dL (ref 0.2–1.2)
Total Protein: 7.2 g/dL (ref 6.0–8.3)

## 2023-02-23 LAB — CBC WITH DIFFERENTIAL/PLATELET
Basophils Absolute: 0.1 10*3/uL (ref 0.0–0.1)
Basophils Relative: 1 % (ref 0.0–3.0)
Eosinophils Absolute: 1 10*3/uL — ABNORMAL HIGH (ref 0.0–0.7)
Eosinophils Relative: 15.7 % — ABNORMAL HIGH (ref 0.0–5.0)
HCT: 41.1 % (ref 36.0–46.0)
Hemoglobin: 13.9 g/dL (ref 12.0–15.0)
Lymphocytes Relative: 45.2 % (ref 12.0–46.0)
Lymphs Abs: 2.9 10*3/uL (ref 0.7–4.0)
MCHC: 33.7 g/dL (ref 30.0–36.0)
MCV: 92.4 fL (ref 78.0–100.0)
Monocytes Absolute: 0.6 10*3/uL (ref 0.1–1.0)
Monocytes Relative: 9.4 % (ref 3.0–12.0)
Neutro Abs: 1.9 10*3/uL (ref 1.4–7.7)
Neutrophils Relative %: 28.7 % — ABNORMAL LOW (ref 43.0–77.0)
Platelets: 373 10*3/uL (ref 150.0–400.0)
RBC: 4.45 Mil/uL (ref 3.87–5.11)
RDW: 13.1 % (ref 11.5–15.5)
WBC: 6.5 10*3/uL (ref 4.0–10.5)

## 2023-02-23 LAB — TSH: TSH: 1.14 u[IU]/mL (ref 0.35–5.50)

## 2023-02-23 LAB — HEMOGLOBIN A1C: Hgb A1c MFr Bld: 8.4 % — ABNORMAL HIGH (ref 4.6–6.5)

## 2023-02-25 ENCOUNTER — Other Ambulatory Visit: Payer: Self-pay | Admitting: Family Medicine

## 2023-02-25 ENCOUNTER — Encounter: Payer: Self-pay | Admitting: Family Medicine

## 2023-02-25 DIAGNOSIS — H93A9 Pulsatile tinnitus, unspecified ear: Secondary | ICD-10-CM | POA: Insufficient documentation

## 2023-02-25 DIAGNOSIS — D721 Eosinophilia, unspecified: Secondary | ICD-10-CM

## 2023-02-25 DIAGNOSIS — E119 Type 2 diabetes mellitus without complications: Secondary | ICD-10-CM | POA: Insufficient documentation

## 2023-02-25 DIAGNOSIS — Z7189 Other specified counseling: Secondary | ICD-10-CM | POA: Insufficient documentation

## 2023-02-25 MED ORDER — LOSARTAN POTASSIUM 50 MG PO TABS: 50.0000 mg | ORAL_TABLET | Freq: Every day | ORAL | 3 refills | Status: AC

## 2023-02-25 NOTE — Assessment & Plan Note (Signed)
Continue Flonase.  Use Hycodan temporarily if she has significant coughing.  Sedation caution discussed with patient.  Tessalon did not help with cough.

## 2023-02-25 NOTE — Assessment & Plan Note (Signed)
Taking gabapentin prn for endometriosis.  S/p hysterectomy.  Toradol rx per GYN clinic.

## 2023-02-25 NOTE — Assessment & Plan Note (Signed)
Will follow-up with endocrinology.  Continue Lipitor Farxiga insulin metformin and Mounjaro.  Diet and exercise discussed with patient.
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
## 2023-02-25 NOTE — Assessment & Plan Note (Signed)
Without associated neurologic symptoms.  Normal tympanic membrane exam bilaterally.

## 2023-02-28 ENCOUNTER — Telehealth: Payer: Self-pay | Admitting: Family Medicine

## 2023-02-28 LAB — INSULIN, RANDOM: Insulin: 15.8 u[IU]/mL

## 2023-02-28 LAB — C-PEPTIDE: C-Peptide: 0.35 ng/mL — ABNORMAL LOW (ref 0.80–3.85)

## 2023-02-28 NOTE — Telephone Encounter (Signed)
Spoke with patient and advised of lab results and Dr. Lianne Bushy recommendations. Patient has been scheduled for non fasting labs on 03/30/23 and for a follow up appt with Dr. Para March on 06/28/2023.

## 2023-02-28 NOTE — Telephone Encounter (Signed)
Patient returned call to AV,she stated that a VM was left for her,but the reason for the call was not stated.She would like a return call back.

## 2023-03-01 ENCOUNTER — Ambulatory Visit: Payer: 59 | Admitting: Physical Therapy

## 2023-03-07 ENCOUNTER — Other Ambulatory Visit: Payer: Self-pay | Admitting: Obstetrics and Gynecology

## 2023-03-08 ENCOUNTER — Ambulatory Visit: Payer: 59 | Admitting: Physical Therapy

## 2023-03-09 ENCOUNTER — Other Ambulatory Visit: Payer: Self-pay | Admitting: Obstetrics and Gynecology

## 2023-03-12 ENCOUNTER — Encounter: Payer: Self-pay | Admitting: Obstetrics and Gynecology

## 2023-03-12 MED ORDER — TRAMADOL HCL 50 MG PO TABS: 100.0000 mg | ORAL_TABLET | Freq: Four times a day (QID) | ORAL | 0 refills | Status: DC | PRN

## 2023-03-21 ENCOUNTER — Encounter: Payer: Self-pay | Admitting: Family Medicine

## 2023-03-21 ENCOUNTER — Other Ambulatory Visit: Payer: Self-pay

## 2023-03-21 DIAGNOSIS — I10 Essential (primary) hypertension: Secondary | ICD-10-CM | POA: Diagnosis not present

## 2023-03-21 DIAGNOSIS — E1165 Type 2 diabetes mellitus with hyperglycemia: Secondary | ICD-10-CM | POA: Diagnosis not present

## 2023-03-21 DIAGNOSIS — E1169 Type 2 diabetes mellitus with other specified complication: Secondary | ICD-10-CM | POA: Diagnosis not present

## 2023-03-21 DIAGNOSIS — E785 Hyperlipidemia, unspecified: Secondary | ICD-10-CM | POA: Diagnosis not present

## 2023-03-21 MED ORDER — MOUNJARO 12.5 MG/0.5ML ~~LOC~~ SOAJ
12.5000 mg | SUBCUTANEOUS | 5 refills | Status: DC
  Filled 2023-03-21 – 2023-04-03 (×2): qty 2, 28d supply, fill #0
  Filled 2023-05-14: qty 2, 28d supply, fill #1
  Filled 2023-06-25: qty 2, 28d supply, fill #2
  Filled 2023-07-19 – 2023-08-05 (×2): qty 2, 28d supply, fill #3
  Filled 2023-08-28: qty 2, 28d supply, fill #4
  Filled 2023-09-27: qty 2, 28d supply, fill #5

## 2023-03-21 NOTE — Telephone Encounter (Signed)
I spoke with pt; pt said this morning FBS 595  at 10:10 am BS was 529. Pt started last night with frequent urination, feeling tired and thirsty. Pt my charted Dr Tedd Sias office and [facility]. I did warm transfer to Dr [employer] office and spoke with Clydie Braun. Call was transferred to Dr [facility] office for pt to speak with nurse and further instruction. Sending FYI to Dr Para March who is out of office and Dr Reece Agar who is in office.;

## 2023-03-21 NOTE — Telephone Encounter (Signed)
Agree with endo f/u if she sees her regularly for diabetes management.  Looks like pt did see endocrinology office today with changes made to regimen.

## 2023-03-22 ENCOUNTER — Ambulatory Visit: Payer: 59 | Admitting: Physical Therapy

## 2023-03-25 ENCOUNTER — Encounter: Payer: Self-pay | Admitting: Obstetrics and Gynecology

## 2023-03-25 ENCOUNTER — Other Ambulatory Visit: Payer: Self-pay | Admitting: Obstetrics and Gynecology

## 2023-03-26 MED ORDER — TRAMADOL HCL 50 MG PO TABS: 100.0000 mg | ORAL_TABLET | Freq: Four times a day (QID) | ORAL | 0 refills | Status: DC | PRN

## 2023-03-30 ENCOUNTER — Other Ambulatory Visit: Payer: 59

## 2023-04-02 ENCOUNTER — Other Ambulatory Visit: Payer: Self-pay

## 2023-04-03 ENCOUNTER — Other Ambulatory Visit: Payer: Self-pay

## 2023-04-04 ENCOUNTER — Ambulatory Visit: Payer: 59 | Admitting: General Practice

## 2023-04-04 ENCOUNTER — Other Ambulatory Visit: Payer: Self-pay | Admitting: Family Medicine

## 2023-04-04 ENCOUNTER — Encounter: Payer: Self-pay | Admitting: General Practice

## 2023-04-04 ENCOUNTER — Encounter: Payer: Self-pay | Admitting: Family Medicine

## 2023-04-04 VITALS — BP 120/78 | HR 81 | Temp 97.9°F | Ht 65.0 in | Wt 138.0 lb

## 2023-04-04 DIAGNOSIS — J069 Acute upper respiratory infection, unspecified: Secondary | ICD-10-CM | POA: Diagnosis not present

## 2023-04-04 NOTE — Assessment & Plan Note (Addendum)
Bacterial vs viral upper respiratory infection. Vital signs and exam stable.  Given the length of symptoms, will treat for viral.   Dr. Para March already sent in some cough medication for her.   Recommednations given for OTC medications. Discussed to stay hydrated, rest and use humidifier as needed.  She will update on day 7 of her symptoms if they have worsened or failed to improve.

## 2023-04-04 NOTE — Patient Instructions (Addendum)
You can try a few things over the counter to help with your symptoms including:  Cough: Delsym or Robitussin (get the off brand, works just as well) Chest Congestion: Mucinex (plain) Nasal Congestion/Ear Pressure/Sinus Pressure: Try using Flonase (fluticasone) nasal spray. Instill 1 spray in each nostril twice daily. This can be purchased over the counter. Body aches, fevers, headache: Ibuprofen (not to exceed 2400 mg in 24 hours) or Acetaminophen-Tylenol (not to exceed 3000 mg in 24 hours) Runny Nose/Throat Drainage/Sneezing/Itchy or Watery Eyes: An antihistamine such as Zyrtec, Claritin, Xyzal, Allegra  You should be feeling better by day 7-8 of symptoms, but please do contact me if this is not the case.   Cool-mist humidifier, stay hydrated and rest.  It was a pleasure meeting you!

## 2023-04-04 NOTE — Progress Notes (Signed)
Established Patient Office Visit  Subjective   Patient ID: [name], female    DOB: 08-09-1980  Age: 42 y.o. MRN: [id number]  Chief Complaint  Patient presents with   Nasal Congestion    Pt complains of nasal congestion/ drainage with sinus pressure in face, cough, scratchy throat. Going on for a couple days. At home covid test neg.    HPI  [name] is a patient of Dr. Para March, who presents today for an acute visit to discuss nasal congestion/drainage with sinus pressure.   Symptom onset was Sunday with runny nose and non-productive cough. Now she has developed sinus pain and pressure around her face and jaw. She denies any fever, body aches, chills. She has tried flonase and a multi-symptom OTC cold medication. She did test for covid at home and it was negative. She has taken the flu shot this season.    Patient Active Problem List   Diagnosis Date Noted   Advance care planning 02/25/2023   Diabetes mellitus without complication (HCC) 02/25/2023   Pulsatile tinnitus 02/25/2023   Dyshidrotic eczema 12/17/2018   Subclinical hypothyroidism 12/17/2018   Subungual hematoma of toe 12/17/2018   Family history of hemochromatosis 11/27/2018   Osteoarthritis of spine with radiculopathy, lumbar region 06/20/2016   Viral URI 08/20/2015   Chronic pelvic pain in female 11/30/2014   Endometriosis 11/30/2014   Hypertension 09/01/2014   Anxiety state 12/11/2013   Routine general medical examination at a health care facility 04/06/2011   Insomnia 12/20/2007   Allergic rhinitis 07/18/2007   Other hyperlipidemia 02/19/2007   Past Medical History:  Diagnosis Date   Abnormal Pap smear    Allergy    Anxiety    Diet-controlled hyperlipidemia    Dyshidrotic eczema    Endometriosis    Family history of hemochromatosis    mother    HA (headache)    otc med prn   Hypertension    Migraines    PONV (postoperative nausea and vomiting)    Pregnancy induced hypertension 2008    Type 2 diabetes mellitus treated with insulin (HCC)    Past Surgical History:  Procedure Laterality Date   ABDOMINAL HYSTERECTOMY     ABLATION ON ENDOMETRIOSIS N/A 06/04/2014   Procedure: ABLATION ON ENDOMETRIOSIS;  Surgeon: Leslie Andrea, MD;  Location: [facility];  Service: Gynecology;  Laterality: N/A;   BIOPSY  12/01/2011   Procedure: BIOPSY;  Surgeon: Leslie Andrea, MD;  Location: [facility];  Service: Gynecology;  Laterality: N/A;   CESAREAN SECTION  2008   CESAREAN SECTION WITH BILATERAL TUBAL LIGATION N/A 12/02/2012   Procedure: Repeat Cesarean Section Delivery Baby Girl @ 1826, Apgars 8/9, Bilateral Tubal Ligation;  Surgeon: Leslie Andrea, MD;  Location: [facility];  Service: Obstetrics;  Laterality: N/A;   COLPOSCOPY     as teenager, results WNL   ENDOMETRIAL BIOPSY     LAPAROSCOPIC ASSISTED VAGINAL HYSTERECTOMY  2016   with bilateral salpingectomy   LAPAROSCOPIC BILATERAL SALPINGECTOMY Bilateral 02/15/2015   Procedure: LAPAROSCOPIC BILATERAL SALPINGECTOMY;  Surgeon: Hildred Laser, MD;  Location: [facility];  Service: Gynecology;  Laterality: Bilateral;   LAPAROSCOPIC LYSIS OF ADHESIONS N/A 06/04/2014   Procedure: LAPAROSCOPIC LYSIS OF ADHESIONS;  Surgeon: Leslie Andrea, MD;  Location: [facility];  Service: Gynecology;  Laterality: N/A;   LAPAROSCOPIC SALPINGO OOPHERECTOMY Right 07/17/2016   Procedure: LAPAROSCOPIC RIGHT SALPINGO OOPHORECTOMY;  Surgeon: Hildred Laser, MD;  Location: [facility];  Service: Gynecology;  Laterality: Right;  LAPAROSCOPY  12/01/2011   Procedure: LAPAROSCOPY OPERATIVE;  Surgeon: Leslie Andrea, MD;  Location: [facility];  Service: Gynecology;  Laterality: N/A;   LAPAROSCOPY N/A 06/04/2014   Procedure: LAPAROSCOPY OPERATIVE;  Surgeon: Leslie Andrea, MD;  Location: [facility];  Service: Gynecology;  Laterality: N/A;   LAPAROSCOPY N/A 02/14/2016   Procedure: LAPAROSCOPY DIAGNOSTIC WITH BIOPSIES POSSIBLE ADHESIOLYSIS;  Surgeon: Hildred Laser, MD;   Location: [facility];  Service: Gynecology;  Laterality: N/A;   LAPAROSCOPY N/A 05/26/2016   Procedure: LAPAROSCOPY operative with lysis of adhesions;  Surgeon: Linzie Collin, MD;  Location: [facility];  Service: Gynecology;  Laterality: N/A;   OOPHORECTOMY Left    TONSILLECTOMY     TUBAL LIGATION     TYMPANOSTOMY TUBE PLACEMENT     Allergies  Allergen Reactions   Sulfamethoxazole-Trimethoprim Swelling and Rash   Azithromycin Rash   Cefprozil Rash and Other (See Comments)   Ceftin [Cefuroxime Axetil] Rash   Other Itching    CHG-wipes she had to use prior to surgery.  Ok to use CHG wash without problems         02/22/2023    3:54 PM 08/30/2021    9:01 AM 12/01/2016    3:30 PM  Depression screen PHQ 2/9  Decreased Interest 0 0 0  Down, Depressed, Hopeless 0 0 0  PHQ - 2 Score 0 0 0  Altered sleeping 0    Tired, decreased energy 0    Change in appetite 0    Feeling bad or failure about yourself  0    Trouble concentrating 0    Moving slowly or fidgety/restless 0    Suicidal thoughts 0    PHQ-9 Score 0    Difficult doing work/chores Not difficult at all         02/22/2023    3:54 PM  GAD 7 : Generalized Anxiety Score  Nervous, Anxious, on Edge 0  Control/stop worrying 0  Worry too much - different things 0  Trouble relaxing 0  Restless 0  Easily annoyed or irritable 0  Afraid - awful might happen 0  Total GAD 7 Score 0  Anxiety Difficulty Not difficult at all      Review of Systems  Constitutional:  Negative for chills and fever.  HENT:  Positive for congestion and sinus pain. Negative for ear pain.   Respiratory:  Positive for cough. Negative for shortness of breath and wheezing.   Cardiovascular:  Negative for chest pain.  Gastrointestinal:  Negative for heartburn and nausea.  Genitourinary:  Negative for dysuria, frequency and urgency.  Neurological:  Positive for headaches. Negative for dizziness.      Objective:     BP 120/78   Pulse 81   Temp 97.9  F (36.6 C) (Oral)   Ht 5\' 5"  (1.651 m)   Wt 138 lb (62.6 kg)   LMP 10/15/2013   SpO2 96%   BMI 22.96 kg/m  BP Readings from Last 3 Encounters:  04/04/23 120/78  02/22/23 106/60  10/03/22 102/61   Wt Readings from Last 3 Encounters:  04/04/23 138 lb (62.6 kg)  02/22/23 137 lb 9.6 oz (62.4 kg)  10/03/22 143 lb 8 oz (65.1 kg)      Physical Exam Vitals and nursing note reviewed.  Constitutional:      Appearance: Normal appearance.  HENT:     Right Ear: Tympanic membrane, ear canal and external ear normal.     Left Ear: Tympanic membrane, ear canal  and external ear normal.     Nose:     Right Sinus: Frontal sinus tenderness present.     Left Sinus: Frontal sinus tenderness present.     Mouth/Throat:     Mouth: Mucous membranes are moist.  Eyes:     Conjunctiva/sclera: Conjunctivae normal.  Cardiovascular:     Rate and Rhythm: Normal rate and regular rhythm.     Pulses: Normal pulses.     Heart sounds: Normal heart sounds.  Pulmonary:     Effort: Pulmonary effort is normal.     Breath sounds: Normal breath sounds.  Neurological:     Mental Status: She is alert and oriented to person, place, and time.  Psychiatric:        Mood and Affect: Mood normal.        Behavior: Behavior normal.        Thought Content: Thought content normal.        Judgment: Judgment normal.      No results found for any visits on 04/04/23.     The ASCVD Risk score (Arnett DK, et al., 2019) failed to calculate for the following reasons:   Cannot find a previous HDL lab   Cannot find a previous total cholesterol lab    Assessment & Plan:  Viral URI Assessment & Plan: Bacterial vs viral upper respiratory infection.   Given the length of symptoms, will treat for viral.   Dr. Para March already sent in some cough medication for her.   Recommednations given for OTC medications. Discussed to stay hydrated, rest and use humidifier as needed.  She will update on day 7 of her symptoms if  they have worsened or failed to improve.         Return if symptoms worsen or fail to improve.    Modesto Charon, NP

## 2023-04-05 ENCOUNTER — Other Ambulatory Visit (INDEPENDENT_AMBULATORY_CARE_PROVIDER_SITE_OTHER): Payer: 59

## 2023-04-05 DIAGNOSIS — D721 Eosinophilia, unspecified: Secondary | ICD-10-CM

## 2023-04-05 NOTE — Addendum Note (Signed)
Addended by: Alvina Chou on: 04/05/2023 08:10 AM   Modules accepted: Orders

## 2023-04-06 LAB — CBC WITH DIFFERENTIAL/PLATELET
Absolute Lymphocytes: 3131 {cells}/uL (ref 850–3900)
Absolute Monocytes: 517 {cells}/uL (ref 200–950)
Basophils Absolute: 50 {cells}/uL (ref 0–200)
Basophils Relative: 0.8 %
Eosinophils Absolute: 416 {cells}/uL (ref 15–500)
Eosinophils Relative: 6.6 %
HCT: 42.4 % (ref 35.0–45.0)
Hemoglobin: 13.9 g/dL (ref 11.7–15.5)
MCH: 30.2 pg (ref 27.0–33.0)
MCHC: 32.8 g/dL (ref 32.0–36.0)
MCV: 92 fL (ref 80.0–100.0)
MPV: 10.9 fL (ref 7.5–12.5)
Monocytes Relative: 8.2 %
Neutro Abs: 2186 {cells}/uL (ref 1500–7800)
Neutrophils Relative %: 34.7 %
Platelets: 433 10*3/uL — ABNORMAL HIGH (ref 140–400)
RBC: 4.61 10*6/uL (ref 3.80–5.10)
RDW: 12 % (ref 11.0–15.0)
Total Lymphocyte: 49.7 %
WBC: 6.3 10*3/uL (ref 3.8–10.8)

## 2023-04-06 LAB — PATHOLOGIST SMEAR REVIEW

## 2023-04-08 ENCOUNTER — Other Ambulatory Visit: Payer: Self-pay | Admitting: Obstetrics and Gynecology

## 2023-04-09 MED ORDER — TRAMADOL HCL 50 MG PO TABS: 100.0000 mg | ORAL_TABLET | Freq: Four times a day (QID) | ORAL | 0 refills | Status: DC | PRN

## 2023-04-19 ENCOUNTER — Other Ambulatory Visit: Payer: Self-pay | Admitting: Family Medicine

## 2023-04-19 ENCOUNTER — Encounter: Payer: Self-pay | Admitting: Obstetrics and Gynecology

## 2023-04-19 DIAGNOSIS — Z1231 Encounter for screening mammogram for malignant neoplasm of breast: Secondary | ICD-10-CM

## 2023-04-23 ENCOUNTER — Other Ambulatory Visit: Payer: Self-pay | Admitting: Obstetrics and Gynecology

## 2023-04-23 MED ORDER — TRAMADOL HCL 50 MG PO TABS: 100.0000 mg | ORAL_TABLET | Freq: Four times a day (QID) | ORAL | 0 refills | Status: DC | PRN

## 2023-05-01 ENCOUNTER — Encounter: Payer: Self-pay | Admitting: Obstetrics and Gynecology

## 2023-05-03 ENCOUNTER — Encounter: Payer: Self-pay | Admitting: Obstetrics and Gynecology

## 2023-05-04 MED ORDER — OXYCODONE-ACETAMINOPHEN 5-325 MG PO TABS: 1.0000 | ORAL_TABLET | Freq: Four times a day (QID) | ORAL | 0 refills | Status: DC | PRN

## 2023-05-06 ENCOUNTER — Other Ambulatory Visit: Payer: Self-pay | Admitting: Obstetrics and Gynecology

## 2023-05-07 MED ORDER — TRAMADOL HCL 50 MG PO TABS: 100.0000 mg | ORAL_TABLET | Freq: Four times a day (QID) | ORAL | 0 refills | Status: DC | PRN

## 2023-05-14 ENCOUNTER — Other Ambulatory Visit: Payer: Self-pay | Admitting: Obstetrics and Gynecology

## 2023-05-14 ENCOUNTER — Other Ambulatory Visit: Payer: Self-pay

## 2023-05-15 ENCOUNTER — Other Ambulatory Visit: Payer: Self-pay

## 2023-05-15 ENCOUNTER — Ambulatory Visit
Admission: RE | Admit: 2023-05-15 | Discharge: 2023-05-15 | Disposition: A | Discharge status: Home / Self Care | Payer: 59 | Source: Ambulatory Visit | Attending: Family Medicine | Admitting: Family Medicine

## 2023-05-15 DIAGNOSIS — Z1231 Encounter for screening mammogram for malignant neoplasm of breast: Secondary | ICD-10-CM | POA: Insufficient documentation

## 2023-05-15 MED ORDER — OXYCODONE-ACETAMINOPHEN 5-325 MG PO TABS: 1.0000 | ORAL_TABLET | Freq: Four times a day (QID) | ORAL | 0 refills | Status: DC | PRN

## 2023-05-17 ENCOUNTER — Other Ambulatory Visit: Payer: Self-pay

## 2023-05-17 ENCOUNTER — Encounter: Payer: Self-pay | Admitting: Obstetrics and Gynecology

## 2023-05-17 ENCOUNTER — Other Ambulatory Visit: Payer: Self-pay | Admitting: Family Medicine

## 2023-05-17 ENCOUNTER — Other Ambulatory Visit: Payer: Self-pay | Admitting: Obstetrics and Gynecology

## 2023-05-17 NOTE — Telephone Encounter (Signed)
Last office visit: 04/04/23 Next office visit: 06/28/23 Last refill:  metFORMIN (GLUCOPHAGE-XR) 750 MG 24 hr tablet 06/09/21   Not showing that this was prescribed by you originally

## 2023-05-17 NOTE — Telephone Encounter (Signed)
Spoke with patient and she is going to have endo handle the metformin as well. I went ahead and refused the medication. But I did let her know if she has any problems to please reach out to [name]. Patient understood

## 2023-05-17 NOTE — Telephone Encounter (Signed)
Please check with patient/pharmacy.  I thought this was coming through endocrine.  Thanks.

## 2023-05-18 ENCOUNTER — Other Ambulatory Visit: Payer: Self-pay

## 2023-05-21 ENCOUNTER — Other Ambulatory Visit: Payer: Self-pay | Admitting: Obstetrics and Gynecology

## 2023-05-21 MED ORDER — TRAMADOL HCL 50 MG PO TABS: 100.0000 mg | ORAL_TABLET | Freq: Four times a day (QID) | ORAL | 0 refills | Status: DC | PRN

## 2023-05-27 ENCOUNTER — Encounter: Payer: Self-pay | Admitting: Obstetrics and Gynecology

## 2023-05-27 ENCOUNTER — Other Ambulatory Visit: Payer: Self-pay | Admitting: Obstetrics and Gynecology

## 2023-05-28 ENCOUNTER — Other Ambulatory Visit: Payer: Self-pay | Admitting: Family Medicine

## 2023-05-28 ENCOUNTER — Encounter: Payer: Self-pay | Admitting: Family Medicine

## 2023-05-28 MED ORDER — OXYCODONE-ACETAMINOPHEN 5-325 MG PO TABS: 1.0000 | ORAL_TABLET | Freq: Four times a day (QID) | ORAL | 0 refills | Status: DC | PRN

## 2023-05-29 ENCOUNTER — Telehealth: Payer: Self-pay | Admitting: Family Medicine

## 2023-05-29 NOTE — Telephone Encounter (Signed)
I spoke with pt; pt said last night she used her nasal spray(pt not sure of name of spray because she is not at home) but she is so much better this morning. Pt said almost no cough, no drainage and no fever. Pt thinks could be allergies and wants to see how she does. Pt does not need refill on Hycodan and will cb for appt if she does not continue to improve.l pt appreciated call. Sending note FYI to Dr Para March.

## 2023-05-29 NOTE — Telephone Encounter (Signed)
Please triage patient and see about her potentially needing OV.  Thanks.

## 2023-05-29 NOTE — Telephone Encounter (Signed)
Noted. Thanks.

## 2023-05-29 NOTE — Telephone Encounter (Signed)
Last office visit: 06/28/23 Next office visit: 02/22/23 Last refill:  HYDROcodone bit-homatropine (HYCODAN) 5-1.5 MG/5ML syrup

## 2023-05-30 NOTE — Telephone Encounter (Signed)
See following phone note.

## 2023-05-30 NOTE — Telephone Encounter (Signed)
Refill not needed per recent phone note.

## 2023-06-01 ENCOUNTER — Other Ambulatory Visit: Payer: Self-pay | Admitting: Obstetrics and Gynecology

## 2023-06-01 MED ORDER — TRAMADOL HCL 50 MG PO TABS: 100.0000 mg | ORAL_TABLET | Freq: Four times a day (QID) | ORAL | 0 refills | Status: DC | PRN

## 2023-06-13 ENCOUNTER — Other Ambulatory Visit: Payer: Self-pay | Admitting: Obstetrics and Gynecology

## 2023-06-13 MED ORDER — TRAMADOL HCL 50 MG PO TABS: 100.0000 mg | ORAL_TABLET | Freq: Four times a day (QID) | ORAL | 0 refills | Status: DC | PRN

## 2023-06-20 ENCOUNTER — Other Ambulatory Visit: Payer: Self-pay | Admitting: Obstetrics and Gynecology

## 2023-06-20 MED ORDER — OXYCODONE-ACETAMINOPHEN 5-325 MG PO TABS
1.0000 | ORAL_TABLET | Freq: Four times a day (QID) | ORAL | 0 refills | Status: DC | PRN
Start: 2023-06-20 — End: 2023-07-03

## 2023-06-24 ENCOUNTER — Other Ambulatory Visit: Payer: Self-pay | Admitting: Obstetrics and Gynecology

## 2023-06-26 ENCOUNTER — Other Ambulatory Visit: Payer: Self-pay

## 2023-06-26 ENCOUNTER — Other Ambulatory Visit: Payer: Self-pay | Admitting: Obstetrics and Gynecology

## 2023-06-26 MED ORDER — TRAMADOL HCL 50 MG PO TABS: 100.0000 mg | ORAL_TABLET | Freq: Four times a day (QID) | ORAL | 0 refills | Status: DC | PRN

## 2023-06-27 ENCOUNTER — Other Ambulatory Visit: Payer: Self-pay | Admitting: Family Medicine

## 2023-06-27 MED ORDER — PERMETHRIN 1 % EX LIQD: 1.0000 | Freq: Once | CUTANEOUS | 0 refills | Status: AC

## 2023-06-28 ENCOUNTER — Ambulatory Visit: Payer: 59 | Admitting: Family Medicine

## 2023-07-03 ENCOUNTER — Other Ambulatory Visit: Payer: Self-pay | Admitting: Obstetrics and Gynecology

## 2023-07-03 ENCOUNTER — Ambulatory Visit (INDEPENDENT_AMBULATORY_CARE_PROVIDER_SITE_OTHER): Admitting: Obstetrics and Gynecology

## 2023-07-03 ENCOUNTER — Encounter: Payer: Self-pay | Admitting: Obstetrics and Gynecology

## 2023-07-03 VITALS — BP 124/89 | HR 109 | Ht 65.0 in | Wt 124.9 lb

## 2023-07-03 DIAGNOSIS — Z90710 Acquired absence of both cervix and uterus: Secondary | ICD-10-CM

## 2023-07-03 DIAGNOSIS — R1032 Left lower quadrant pain: Secondary | ICD-10-CM

## 2023-07-03 DIAGNOSIS — Z90721 Acquired absence of ovaries, unilateral: Secondary | ICD-10-CM | POA: Diagnosis not present

## 2023-07-03 DIAGNOSIS — Z8742 Personal history of other diseases of the female genital tract: Secondary | ICD-10-CM

## 2023-07-03 DIAGNOSIS — F439 Reaction to severe stress, unspecified: Secondary | ICD-10-CM | POA: Diagnosis not present

## 2023-07-03 DIAGNOSIS — G8929 Other chronic pain: Secondary | ICD-10-CM

## 2023-07-03 DIAGNOSIS — R102 Pelvic and perineal pain: Secondary | ICD-10-CM

## 2023-07-03 MED ORDER — OXYCODONE-ACETAMINOPHEN 5-325 MG PO TABS
1.0000 | ORAL_TABLET | Freq: Four times a day (QID) | ORAL | 0 refills | Status: DC | PRN
Start: 2023-07-03 — End: 2023-09-24

## 2023-07-03 NOTE — Progress Notes (Unsigned)
    GYNECOLOGY PROGRESS NOTE  Subjective:    Patient ID: [name], female    DOB: April 18, 1980, 43 y.o.   MRN: [id number]  HPI  Patient is a 43 y.o. G34P1102 female who presents for complaints of acute pelvic pain. Patient has a history of chronic pelvic pain, has history of hysterectomy and subsequent BSO due to history of pelvic pain and endometriosis.  Patient currently utilizing chronic pain management with Tramadol, however has had several bouts of new onset pain over the past 1.5 months, requiring stronger pain medication with Oxycodone. Notes that today while at work she had 2 episodes of sudden intense sharp pain ~ 10 minutes apart that doubled her over.  Notes that the episodes lasted for several minutes each.  Feels like this pain was a little different than her previous episodes of pain. The pain was located mostly left-sided in the pelvis. Does report that she had a bowel movement ~ 40 minutes later after the pain which was normal (no constipation symptoms).   Of note, patient had operative laparoscopy to for possible treatment of residual endometriosis on 08/28/2022, however no implants were noted.   The following portions of the patient's history were reviewed and updated as appropriate: allergies, current medications, past family history, past medical history, past social history, past surgical history, and problem list.  Review of Systems Pertinent items noted in HPI and remainder of comprehensive ROS otherwise negative.   Objective:   Blood pressure 124/89, pulse (!) 109, height 5\' 5"  (1.651 m), weight 124 lb 14.4 oz (56.7 kg), last menstrual period 10/15/2013.  Body mass index is 20.78 kg/m. General appearance: alert and no distress Abdomen:  soft, mildly tender in LLQ , no organomegaly or distention.  Pelvic: deferred   Assessment:   1. Chronic pelvic pain in female   2. History of endometriosis   3. S/P hysterectomy with oophorectomy      Plan:   - Unclear  cause of recent bouts of pain. No recent surgical evidence of active endometriosis based on last laparoscopy. Reports otherwise normal bowel movements and denies any urinary issues. Currently requesting another small prescription for pain (Oxycodone). Discussed again other alternative options for longer-term pain management if this was going to be her new norm. Also discussed possible referral back to pelvic pain specialist. As current OB/GYN will be departing from the practice soon, discussed options for further management of her chronic pain.  Also discussed the possibility of scar tissue causing bouts of pain, strongly encourage resumption of physical therapy (notes going to 1 session ~ 5 months ago but felt that it was overwhelming and did not return). Patient notes that she will reach back out to resume care.    A total of 19 minutes were spent face-to-face with the patient during this encounter and over half of that time dealt with counseling and coordination of care.  Hildred Laser, MD [facility]

## 2023-07-05 ENCOUNTER — Ambulatory Visit: Admitting: Family Medicine

## 2023-07-06 ENCOUNTER — Other Ambulatory Visit: Payer: Self-pay | Admitting: Obstetrics and Gynecology

## 2023-07-06 ENCOUNTER — Telehealth: Payer: Self-pay

## 2023-07-06 NOTE — Telephone Encounter (Signed)
 Pt called triage seen Dr Valentino Saxon last Tuesday. At the appointment the pain was not there and a ultrasound was not ordered. the pain is back and pt wants to get the u/s . Pain is a level 9 lower left side. I advised her you was not in the office today that I would send the message and she would have to wait for your response.

## 2023-07-09 ENCOUNTER — Other Ambulatory Visit: Payer: Self-pay | Admitting: Obstetrics and Gynecology

## 2023-07-09 MED ORDER — TRAMADOL HCL 50 MG PO TABS: 100.0000 mg | ORAL_TABLET | Freq: Four times a day (QID) | ORAL | 0 refills | Status: DC | PRN

## 2023-07-10 ENCOUNTER — Ambulatory Visit: Admitting: Family Medicine

## 2023-07-10 DIAGNOSIS — Z803 Family history of malignant neoplasm of breast: Secondary | ICD-10-CM | POA: Diagnosis not present

## 2023-07-10 DIAGNOSIS — Z1272 Encounter for screening for malignant neoplasm of vagina: Secondary | ICD-10-CM | POA: Diagnosis not present

## 2023-07-10 DIAGNOSIS — Z801 Family history of malignant neoplasm of trachea, bronchus and lung: Secondary | ICD-10-CM | POA: Diagnosis not present

## 2023-07-10 DIAGNOSIS — Z8041 Family history of malignant neoplasm of ovary: Secondary | ICD-10-CM | POA: Diagnosis not present

## 2023-07-10 DIAGNOSIS — Z01419 Encounter for gynecological examination (general) (routine) without abnormal findings: Secondary | ICD-10-CM | POA: Diagnosis not present

## 2023-07-12 ENCOUNTER — Ambulatory Visit: Admitting: Family Medicine

## 2023-07-12 ENCOUNTER — Telehealth: Payer: Self-pay

## 2023-07-12 NOTE — Telephone Encounter (Signed)
 Pharmacist advised patient has picked up rx.

## 2023-07-16 ENCOUNTER — Ambulatory Visit: Admitting: Family Medicine

## 2023-07-17 ENCOUNTER — Other Ambulatory Visit: Payer: Self-pay

## 2023-07-17 ENCOUNTER — Encounter: Payer: Self-pay | Admitting: Family Medicine

## 2023-07-17 DIAGNOSIS — Z23 Encounter for immunization: Secondary | ICD-10-CM | POA: Diagnosis not present

## 2023-07-17 DIAGNOSIS — Z794 Long term (current) use of insulin: Secondary | ICD-10-CM | POA: Diagnosis not present

## 2023-07-17 DIAGNOSIS — S6992XA Unspecified injury of left wrist, hand and finger(s), initial encounter: Secondary | ICD-10-CM | POA: Diagnosis present

## 2023-07-17 DIAGNOSIS — S61211A Laceration without foreign body of left index finger without damage to nail, initial encounter: Secondary | ICD-10-CM | POA: Insufficient documentation

## 2023-07-17 DIAGNOSIS — Z79899 Other long term (current) drug therapy: Secondary | ICD-10-CM | POA: Insufficient documentation

## 2023-07-17 DIAGNOSIS — I10 Essential (primary) hypertension: Secondary | ICD-10-CM | POA: Insufficient documentation

## 2023-07-17 DIAGNOSIS — E119 Type 2 diabetes mellitus without complications: Secondary | ICD-10-CM | POA: Insufficient documentation

## 2023-07-17 DIAGNOSIS — Z7984 Long term (current) use of oral hypoglycemic drugs: Secondary | ICD-10-CM | POA: Insufficient documentation

## 2023-07-17 DIAGNOSIS — W260XXA Contact with knife, initial encounter: Secondary | ICD-10-CM | POA: Diagnosis not present

## 2023-07-17 NOTE — ED Triage Notes (Signed)
 Pt to ED via pov C/O puncture wound to left index finger. Pt was opening box with a knife when it slipped and stabbed her on bottom of left index finger. No bleeding at this time. Pt tetanus shot not UTD

## 2023-07-18 ENCOUNTER — Emergency Department
Admission: EM | Admit: 2023-07-18 | Discharge: 2023-07-18 | Disposition: A | Discharge status: Home / Self Care | Attending: Emergency Medicine | Admitting: Emergency Medicine

## 2023-07-18 DIAGNOSIS — S61211A Laceration without foreign body of left index finger without damage to nail, initial encounter: Secondary | ICD-10-CM

## 2023-07-18 MED ORDER — TETANUS-DIPHTH-ACELL PERTUSSIS 5-2.5-18.5 LF-MCG/0.5 IM SUSY
0.5000 mL | PREFILLED_SYRINGE | Freq: Once | INTRAMUSCULAR | Status: AC
  Administered 2023-07-18: 0.5 mL via INTRAMUSCULAR
  Filled 2023-07-18: qty 0.5

## 2023-07-18 MED ORDER — IBUPROFEN 600 MG PO TABS
600.0000 mg | ORAL_TABLET | Freq: Once | ORAL | Status: AC
  Administered 2023-07-18: 600 mg via ORAL
  Filled 2023-07-18: qty 1

## 2023-07-18 NOTE — Discharge Instructions (Signed)
Your tetanus has been updated and will be good for 10 years.  Keep wound clean and dry.  Return to the ER for worsening symptoms, redness/swelling, purulent discharge or other concerns. ?

## 2023-07-18 NOTE — ED Provider Notes (Signed)
 [facility] Provider Note    Event Date/Time   First MD Initiated Contact with Patient 07/18/23 0303     (approximate)   History   Laceration   HPI  [name] is a 43 y.o. female who presents to the ED from home with a chief complaint of left index finger laceration incurred around 9:40 PM.  Patient was opening a box with a clean steak knife when it slipped and punctured her left index finger.  Bleeding controlled.  Tetanus is not up-to-date.  Voices no other complaints or injuries.     Past Medical History   Past Medical History:  Diagnosis Date   Abnormal Pap smear    Allergy    Anxiety    Diet-controlled hyperlipidemia    Dyshidrotic eczema    Endometriosis    Family history of hemochromatosis    mother    HA (headache)    otc med prn   Hypertension    Migraines    PONV (postoperative nausea and vomiting)    Pregnancy induced hypertension 2008   Type 2 diabetes mellitus treated with insulin [facility])      Active Problem List   Patient Active Problem List   Diagnosis Date Noted   Advance care planning 02/25/2023   Diabetes mellitus without complication (HCC) 02/25/2023   Pulsatile tinnitus 02/25/2023   Dyshidrotic eczema 12/17/2018   Subclinical hypothyroidism 12/17/2018   Subungual hematoma of toe 12/17/2018   Family history of hemochromatosis 11/27/2018   Osteoarthritis of spine with radiculopathy, lumbar region 06/20/2016   Viral URI 08/20/2015   Chronic pelvic pain in female 11/30/2014   Endometriosis 11/30/2014   Hypertension 09/01/2014   Anxiety state 12/11/2013   Routine general medical examination at a health care facility 04/06/2011   Insomnia 12/20/2007   Allergic rhinitis 07/18/2007   Other hyperlipidemia 02/19/2007     Past Surgical History   Past Surgical History:  Procedure Laterality Date   ABDOMINAL HYSTERECTOMY     ABLATION ON ENDOMETRIOSIS N/A 06/04/2014   Procedure: ABLATION ON ENDOMETRIOSIS;   Surgeon: Leslie Andrea, MD;  Location: [facility];  Service: Gynecology;  Laterality: N/A;   BIOPSY  12/01/2011   Procedure: BIOPSY;  Surgeon: Leslie Andrea, MD;  Location: [facility];  Service: Gynecology;  Laterality: N/A;   CESAREAN SECTION  2008   CESAREAN SECTION WITH BILATERAL TUBAL LIGATION N/A 12/02/2012   Procedure: Repeat Cesarean Section Delivery Baby Girl @ 1826, Apgars 8/9, Bilateral Tubal Ligation;  Surgeon: Leslie Andrea, MD;  Location: [facility];  Service: Obstetrics;  Laterality: N/A;   COLPOSCOPY     as teenager, results WNL   ENDOMETRIAL BIOPSY     LAPAROSCOPIC ASSISTED VAGINAL HYSTERECTOMY  2016   with bilateral salpingectomy   LAPAROSCOPIC BILATERAL SALPINGECTOMY Bilateral 02/15/2015   Procedure: LAPAROSCOPIC BILATERAL SALPINGECTOMY;  Surgeon: Hildred Laser, MD;  Location: [facility];  Service: Gynecology;  Laterality: Bilateral;   LAPAROSCOPIC LYSIS OF ADHESIONS N/A 06/04/2014   Procedure: LAPAROSCOPIC LYSIS OF ADHESIONS;  Surgeon: Leslie Andrea, MD;  Location: [facility];  Service: Gynecology;  Laterality: N/A;   LAPAROSCOPIC SALPINGO OOPHERECTOMY Right 07/17/2016   Procedure: LAPAROSCOPIC RIGHT SALPINGO OOPHORECTOMY;  Surgeon: Hildred Laser, MD;  Location: [facility];  Service: Gynecology;  Laterality: Right;   LAPAROSCOPY  12/01/2011   Procedure: LAPAROSCOPY OPERATIVE;  Surgeon: Leslie Andrea, MD;  Location: [facility];  Service: Gynecology;  Laterality: N/A;   LAPAROSCOPY N/A 06/04/2014  Procedure: LAPAROSCOPY OPERATIVE;  Surgeon: Leslie Andrea, MD;  Location: [facility];  Service: Gynecology;  Laterality: N/A;   LAPAROSCOPY N/A 02/14/2016   Procedure: LAPAROSCOPY DIAGNOSTIC WITH BIOPSIES POSSIBLE ADHESIOLYSIS;  Surgeon: Hildred Laser, MD;  Location: [facility];  Service: Gynecology;  Laterality: N/A;   LAPAROSCOPY N/A 05/26/2016   Procedure: LAPAROSCOPY operative with lysis of adhesions;  Surgeon: Linzie Collin, MD;  Location: [facility];  Service: Gynecology;   Laterality: N/A;   OOPHORECTOMY Left    TONSILLECTOMY     TUBAL LIGATION     TYMPANOSTOMY TUBE PLACEMENT       Home Medications   Prior to Admission medications   Medication Sig Start Date End Date Taking? Authorizing Provider  amLODipine (NORVASC) 10 MG tablet TAKE 1 TABLET BY MOUTH DAILY 05/17/23   Joaquim Nam, MD  atorvastatin (LIPITOR) 20 MG tablet Take 20 mg by mouth daily. 06/29/21   [provider]  Continuous Glucose Sensor (DEXCOM G7 SENSOR) MISC USE 1 SENSOR EVERY 10 DAYS AS DIRECTED 11/08/22   [provider]  dapagliflozin propanediol (FARXIGA) 10 MG TABS tablet Take 10 mg by mouth daily.    [provider]  estradiol (ESTRACE) 1 MG tablet TAKE ONE TABLET BY MOUTH EVERY DAY 10/11/22   Hildred Laser, MD  gemfibrozil (LOPID) 600 MG tablet Take by mouth. 07/16/20   [provider]  insulin glargine (LANTUS) 100 UNIT/ML injection Inject 0-15 Units into the skin daily. Based on AM sugar    [provider]  losartan (COZAAR) 50 MG tablet Take 1 tablet (50 mg total) by mouth daily. 02/25/23   Joaquim Nam, MD  metFORMIN (GLUCOPHAGE-XR) 750 MG 24 hr tablet Take 750 mg by mouth 2 (two) times daily. 06/09/21   [provider]  metoprolol succinate (TOPROL-XL) 50 MG 24 hr tablet Take 1 tablet (50 mg total) by mouth daily. Take with or immediately following a meal. 10/24/19   Joni Reining, PA-C  ORILISSA 200 MG TABS SMARTSIG:1 Tablet(s) By Mouth Morning-Night 11/09/22   [provider]  oxyCODONE-acetaminophen (PERCOCET/ROXICET) 5-325 MG tablet Take 1-2 tablets by mouth every 6 (six) hours as needed for severe pain (pain score 7-10). 07/03/23   Hildred Laser, MD  tirzepatide [facility]) 12.5 MG/0.5ML Pen Inject 12.5 mg into the skin once a week. 03/21/23     traMADol (ULTRAM) 50 MG tablet Take 2 tablets (100 mg total) by mouth every 6 (six) hours as needed for moderate pain (pain score 4-6) or severe pain (pain score 7-10).  07/09/23   Hildred Laser, MD     Allergies  Sulfamethoxazole-trimethoprim, Azithromycin, Cefprozil, Ceftin [cefuroxime axetil], and Other   Family History   Family History  Problem Relation Age of Onset   Hypertension Mother    Hemochromatosis Mother    Diabetes Father    Hypertension Father    Ovarian cancer Maternal Grandmother    Lung cancer Maternal Grandfather    Brain cancer Maternal Grandfather    CVA Paternal Grandmother    Heart attack Paternal Grandmother    Kidney disease Paternal Grandfather    Cancer Paternal Grandfather    Breast cancer Paternal Aunt    Coronary artery disease Paternal Uncle    Colon cancer Neg Hx      Physical Exam  Triage Vital Signs: ED Triage Vitals  Encounter Vitals Group     BP 07/17/23 2232 (!) 147/105     Systolic BP Percentile --      Diastolic  BP Percentile --      Pulse Rate 07/17/23 2232 84     Resp 07/17/23 2232 16     Temp 07/17/23 2232 98.6 F (37 C)     Temp Source 07/17/23 2232 Oral     SpO2 07/17/23 2232 100 %     Weight 07/17/23 2229 125 lb (56.7 kg)     Height 07/17/23 2229 5\' 5"  (1.651 m)     Head Circumference --      Peak Flow --      Pain Score 07/17/23 2228 6     Pain Loc --      Pain Education --      Exclude from Growth Chart --     Updated Vital Signs: BP 138/86 (BP Location: Right Arm)   Pulse 68   Temp 97.8 F (36.6 C) (Oral)   Resp 17   Ht 5\' 5"  (1.651 m)   Wt 56.7 kg   LMP 10/15/2013   SpO2 99%   BMI 20.80 kg/m    General: Awake, no distress.  CV:  Good peripheral perfusion.  Resp:  Normal effort.  Abd:  No distention.  Other:  Left index finger: Minor superficial laceration to volar aspect of proximal phalanx without active bleeding.  2+ radial pulse.  Brisk, less than 5-second capillary refill.   ED Results / Procedures / Treatments  Labs (all labs ordered are listed, but only abnormal results are displayed) Labs Reviewed - No data to  display   EKG  None   RADIOLOGY None   Official radiology report(s): No results found.   PROCEDURES:  Critical Care performed: No  Procedures   MEDICATIONS ORDERED IN ED: Medications  Tdap (BOOSTRIX) injection 0.5 mL (has no administration in time range)  ibuprofen (ADVIL) tablet 600 mg (has no administration in time range)     IMPRESSION / MDM / ASSESSMENT AND PLAN / ED COURSE  I reviewed the triage vital signs and the nursing notes.                             43 year old female presenting with minor finger laceration.  Will update tetanus, cleanse finger and dressed with nonstick dressing.  Strict return precautions given.  Patient verbalizes understanding and agrees with plan of care.  Patient's presentation is most consistent with acute, uncomplicated illness.    FINAL CLINICAL IMPRESSION(S) / ED DIAGNOSES   Final diagnoses:  Laceration of left index finger without foreign body without damage to nail, initial encounter     Rx / DC Orders   ED Discharge Orders     None        Note:  This document was prepared using Dragon voice recognition software and may include unintentional dictation errors.   Irean Hong, MD 07/18/23 [phone]

## 2023-07-19 ENCOUNTER — Other Ambulatory Visit: Payer: Self-pay | Admitting: Obstetrics and Gynecology

## 2023-07-23 ENCOUNTER — Other Ambulatory Visit: Payer: Self-pay | Admitting: Obstetrics and Gynecology

## 2023-07-23 MED ORDER — TRAMADOL HCL 50 MG PO TABS: 100.0000 mg | ORAL_TABLET | Freq: Four times a day (QID) | ORAL | 0 refills | Status: DC | PRN

## 2023-07-23 NOTE — Telephone Encounter (Signed)
 Can I refill the traMADol 50 MG for her?

## 2023-07-30 ENCOUNTER — Other Ambulatory Visit: Payer: Self-pay

## 2023-08-02 ENCOUNTER — Other Ambulatory Visit: Payer: Self-pay | Admitting: Obstetrics and Gynecology

## 2023-08-06 ENCOUNTER — Other Ambulatory Visit: Payer: Self-pay | Admitting: Obstetrics and Gynecology

## 2023-08-06 ENCOUNTER — Encounter: Payer: Self-pay | Admitting: Obstetrics and Gynecology

## 2023-08-07 MED ORDER — TRAMADOL HCL 50 MG PO TABS: 100.0000 mg | ORAL_TABLET | Freq: Four times a day (QID) | ORAL | 0 refills | Status: DC | PRN

## 2023-08-30 ENCOUNTER — Encounter: Payer: Self-pay | Admitting: Physician Assistant

## 2023-08-30 ENCOUNTER — Other Ambulatory Visit: Payer: Self-pay | Admitting: Physician Assistant

## 2023-08-30 MED ORDER — HYDROCOD POLI-CHLORPHE POLI ER 10-8 MG/5ML PO SUER: 5.0000 mL | Freq: Two times a day (BID) | ORAL | 0 refills | Status: DC | PRN

## 2023-09-04 ENCOUNTER — Other Ambulatory Visit: Payer: Self-pay | Admitting: Physician Assistant

## 2023-09-05 ENCOUNTER — Encounter: Payer: Self-pay | Admitting: Obstetrics

## 2023-09-05 ENCOUNTER — Other Ambulatory Visit: Payer: Self-pay | Admitting: Obstetrics

## 2023-09-10 ENCOUNTER — Encounter: Payer: Self-pay | Admitting: Family Medicine

## 2023-09-11 ENCOUNTER — Other Ambulatory Visit: Payer: Self-pay | Admitting: Family Medicine

## 2023-09-12 ENCOUNTER — Encounter: Payer: Self-pay | Admitting: Family Medicine

## 2023-09-12 NOTE — Telephone Encounter (Signed)
Okay to continue.  Sent.  Thanks. 

## 2023-09-12 NOTE — Progress Notes (Unsigned)
    GYNECOLOGY PROGRESS NOTE  Subjective:  PCP: Donnie Galea, MD  Patient ID: [name], female    DOB: June 24, 1980, 43 y.o.   MRN: [id number]  HPI  Patient is a 43 y.o. G15P1102 female who presents for chronic pelvic pain follow up, previously seeing Dr. Denman Fischer and manages pain with Percocet and Tramadol . PMH of or a consult on endometriosis and a refill on her tramadol . She is a former pt of Dr Denman Fischer.   {Common ambulatory SmartLinks:19316}  Review of Systems {ros; complete:30496}   Objective:   Last menstrual period 10/15/2013. There is no height or weight on file to calculate BMI.  General appearance: {general exam:16600} Abdomen: {abdominal exam:16834} Pelvic: {pelvic exam:16852::"cervix normal in appearance","external genitalia normal","no adnexal masses or tenderness","no cervical motion tenderness","rectovaginal septum normal","uterus normal size, shape, and consistency","vagina normal without discharge"} Extremities: {extremity exam:5109} Neurologic: {neuro exam:17854}   Assessment/Plan:   No diagnosis found.  PMR PCP for other etiologies, declined to refill Tramadol , long discussion why and pt amenable F/u with pelvic CT, will call with results  There are no diagnoses linked to this encounter.     Sofia Dunn, DO [facility]

## 2023-09-12 NOTE — Telephone Encounter (Signed)
 LOV: 04/04/23 NOV: NOTHING SCHEDULED LAST REFILL: : METOPROLOL  SUCCINATE ER 50 MG TAB 10/24/2019 90 TABLETS 3 REFILLS. NOT ORIGINALLY FILLED BY Vallarie Gauze

## 2023-09-13 ENCOUNTER — Encounter: Payer: Self-pay | Admitting: Family Medicine

## 2023-09-13 ENCOUNTER — Ambulatory Visit: Admitting: Obstetrics

## 2023-09-13 ENCOUNTER — Encounter: Payer: Self-pay | Admitting: Obstetrics

## 2023-09-13 VITALS — BP 123/85 | HR 106 | Ht 65.0 in | Wt 123.0 lb

## 2023-09-13 DIAGNOSIS — F1190 Opioid use, unspecified, uncomplicated: Secondary | ICD-10-CM | POA: Diagnosis not present

## 2023-09-13 DIAGNOSIS — Z8742 Personal history of other diseases of the female genital tract: Secondary | ICD-10-CM | POA: Diagnosis not present

## 2023-09-13 DIAGNOSIS — R102 Pelvic and perineal pain: Secondary | ICD-10-CM

## 2023-09-13 DIAGNOSIS — G8929 Other chronic pain: Secondary | ICD-10-CM | POA: Diagnosis not present

## 2023-09-14 ENCOUNTER — Other Ambulatory Visit: Payer: Self-pay | Admitting: Family Medicine

## 2023-09-16 ENCOUNTER — Other Ambulatory Visit: Payer: Self-pay | Admitting: Family Medicine

## 2023-09-16 MED ORDER — TRAMADOL HCL 50 MG PO TABS: 50.0000 mg | ORAL_TABLET | Freq: Two times a day (BID) | ORAL | 0 refills | Status: DC | PRN

## 2023-09-17 NOTE — Telephone Encounter (Signed)
 Just an [place]

## 2023-09-18 ENCOUNTER — Other Ambulatory Visit: Payer: Self-pay | Admitting: Obstetrics

## 2023-09-18 DIAGNOSIS — G8929 Other chronic pain: Secondary | ICD-10-CM

## 2023-09-18 DIAGNOSIS — R1032 Left lower quadrant pain: Secondary | ICD-10-CM

## 2023-09-19 ENCOUNTER — Ambulatory Visit: Admitting: Obstetrics

## 2023-09-19 ENCOUNTER — Ambulatory Visit: Admission: RE | Admit: 2023-09-19 | Source: Ambulatory Visit

## 2023-09-20 ENCOUNTER — Encounter: Admitting: Family Medicine

## 2023-09-24 ENCOUNTER — Other Ambulatory Visit: Payer: Self-pay | Admitting: Family Medicine

## 2023-09-24 ENCOUNTER — Ambulatory Visit (INDEPENDENT_AMBULATORY_CARE_PROVIDER_SITE_OTHER): Admitting: Family Medicine

## 2023-09-24 VITALS — BP 118/78 | HR 88 | Temp 98.9°F | Ht 65.16 in | Wt 119.6 lb

## 2023-09-24 DIAGNOSIS — E119 Type 2 diabetes mellitus without complications: Secondary | ICD-10-CM

## 2023-09-24 DIAGNOSIS — G8929 Other chronic pain: Secondary | ICD-10-CM | POA: Diagnosis not present

## 2023-09-24 DIAGNOSIS — R102 Pelvic and perineal pain: Secondary | ICD-10-CM

## 2023-09-24 DIAGNOSIS — R634 Abnormal weight loss: Secondary | ICD-10-CM | POA: Diagnosis not present

## 2023-09-24 NOTE — Patient Instructions (Signed)
 I'll check on getting the CT set up.  I'll check your prior urology notes.   Please call endocrine about possibly changing mounjaro .  Go to the lab on the way out.   If you have mychart we'll likely use that to update you.    Take care.  Glad to see you.

## 2023-09-24 NOTE — Progress Notes (Unsigned)
 Deferred annual exam with DM2 f/u pending.   Pelvic pain predates DM2 and DM2 tx.  Sx started about 12 years ago.  No recent migraines.  S/p TAH and BSO.  Nearly daily pain but not today.  Her CT was reportedly denied by insurance.  Discussed trying to get that set up.  She had prev laparoscopy documented endometriosis.     History of weight loss noted.  Discussed contacting endo about weight loss and potentially changing mounjaro .  See following phone note.  Chronic pelvic pain differential discussed with patient.  She has typically right lower quadrant pain but it can be in the left lower quadrant.  It can last days at a time.  Heat seems to help.  She can double up from pain.  It does not decrease with a bowel movement.  No dysuria.  No bright red blood per rectum.    She has seen gynecology in the past and previously tramadol /gabapentin  for pain.  Meds, vitals, and allergies reviewed.   ROS: Per HPI unless specifically indicated in ROS section   GEN: nad, alert and oriented HEENT: ncat NECK: supple w/o LA CV: rrr PULM: ctab, no inc wob ABD: soft, +bs, slightly tender palpation right lower quadrant without rebound. EXT: no edema SKIN: Well-perfused.  No rash.  40 minutes were devoted to patient care in this encounter (this includes time spent reviewing the patient's file/history, interviewing and examining the patient, counseling/reviewing plan with patient).

## 2023-09-25 LAB — COMPREHENSIVE METABOLIC PANEL WITH GFR
ALT: 46 U/L — ABNORMAL HIGH (ref 0–35)
AST: 22 U/L (ref 0–37)
Albumin: 5 g/dL (ref 3.5–5.2)
Alkaline Phosphatase: 57 U/L (ref 39–117)
BUN: 13 mg/dL (ref 6–23)
CO2: 31 meq/L (ref 19–32)
Calcium: 10.4 mg/dL (ref 8.4–10.5)
Chloride: 102 meq/L (ref 96–112)
Creatinine, Ser: 0.69 mg/dL (ref 0.40–1.20)
GFR: 107.62 mL/min (ref >60.00)
Glucose, Bld: 166 mg/dL — ABNORMAL HIGH (ref 70–99)
Potassium: 4 meq/L (ref 3.5–5.1)
Sodium: 140 meq/L (ref 135–145)
Total Bilirubin: 0.6 mg/dL (ref 0.2–1.2)
Total Protein: 7.7 g/dL (ref 6.0–8.3)

## 2023-09-25 LAB — CBC WITH DIFFERENTIAL/PLATELET
Basophils Absolute: 0 10*3/uL (ref 0.0–0.1)
Basophils Relative: 0.7 % (ref 0.0–3.0)
Eosinophils Absolute: 0.1 10*3/uL (ref 0.0–0.7)
Eosinophils Relative: 1.8 % (ref 0.0–5.0)
HCT: 41.4 % (ref 36.0–46.0)
Hemoglobin: 13.9 g/dL (ref 12.0–15.0)
Lymphocytes Relative: 34.4 % (ref 12.0–46.0)
Lymphs Abs: 1.8 10*3/uL (ref 0.7–4.0)
MCHC: 33.6 g/dL (ref 30.0–36.0)
MCV: 91.1 fl (ref 78.0–100.0)
Monocytes Absolute: 0.4 10*3/uL (ref 0.1–1.0)
Monocytes Relative: 7.5 % (ref 3.0–12.0)
Neutro Abs: 2.9 10*3/uL (ref 1.4–7.7)
Neutrophils Relative %: 55.6 % (ref 43.0–77.0)
Platelets: 395 10*3/uL (ref 150.0–400.0)
RBC: 4.55 Mil/uL (ref 3.87–5.11)
RDW: 13 % (ref 11.5–15.5)
WBC: 5.1 10*3/uL (ref 4.0–10.5)

## 2023-09-25 LAB — URINALYSIS, ROUTINE W REFLEX MICROSCOPIC
Bilirubin Urine: NEGATIVE
Hgb urine dipstick: NEGATIVE
Ketones, ur: NEGATIVE
Leukocytes,Ua: NEGATIVE
Nitrite: NEGATIVE
Specific Gravity, Urine: 1.01 (ref 1.000–1.030)
Total Protein, Urine: NEGATIVE
Urine Glucose: >=1000 — AB
Urobilinogen, UA: 0.2 (ref 0.0–1.0)
pH: 6 (ref 5.0–8.0)

## 2023-09-25 LAB — LIPID PANEL
Cholesterol: 149 mg/dL (ref 0–200)
HDL: 68.2 mg/dL (ref >39.00)
LDL Cholesterol: 67 mg/dL (ref 0–99)
NonHDL: 80.36
Total CHOL/HDL Ratio: 2
Triglycerides: 65 mg/dL (ref 0.0–149.0)
VLDL: 13 mg/dL (ref 0.0–40.0)

## 2023-09-25 LAB — SEDIMENTATION RATE: Sed Rate: 9 mm/h (ref 0–20)

## 2023-09-25 LAB — HEMOGLOBIN A1C: Hgb A1c MFr Bld: 7.6 % — ABNORMAL HIGH (ref 4.6–6.5)

## 2023-09-25 NOTE — Telephone Encounter (Signed)
 LOV:09/24/23 NOV: nothing scheduled LAST REFILL: GABAPENTIN  600 MG TAB

## 2023-09-26 ENCOUNTER — Telehealth: Payer: Self-pay | Admitting: Family Medicine

## 2023-09-26 DIAGNOSIS — R634 Abnormal weight loss: Secondary | ICD-10-CM | POA: Insufficient documentation

## 2023-09-26 NOTE — Telephone Encounter (Signed)
 Patient reported previous evaluation with [facility].  Please request those records and let me know when they come in.  Thanks.

## 2023-09-26 NOTE — Telephone Encounter (Signed)
 Please verify how much she had been taking, in term of dose and frequency.  Please let me know.  Thanks.

## 2023-09-26 NOTE — Assessment & Plan Note (Signed)
 We will see about getting her CT set up.  I will check back on her urology notes.  Differential discussed.  Given her surgical history we can exclude uterine ovarian fallopian tube and cervical pathology.  She has never had any type of DVT.  She had previous urology evaluation and I will check for those records.  It is possible that she could have a lumbar source but this does not feel like typical back pain that she has had.  We talked about muscular causes, gastrointestinal cause, neuropathy.  Discussed potential pelvic floor PT.  Discussed possibility of adhesions/endometriosis.  I think it makes sense to get the CT done, look back her urology notes, and go from there.  She can use tramadol  with sedation caution in the meantime.  She can update me as needed.

## 2023-09-26 NOTE — Telephone Encounter (Signed)
 Please contact endocrinology below regarding patient's weight loss and asked them to contact the patient about possible stop/change to Mounjaro  dose.  Thanks.  Solum, Ledon Pry, MD  [phone] [facility] [address]  [facility] [place], [place] [address]  [phone] (Work)  [phone] ([address]    [employer] [place], [place]  [address]  [place], [place] [address]  [phone] (Work)  [phone] (Fax)

## 2023-09-26 NOTE — Assessment & Plan Note (Signed)
 I asked patient to call endocrinology about potentially changing her Mounjaro  dose.  See notes on labs.

## 2023-09-27 ENCOUNTER — Encounter: Payer: Self-pay | Admitting: Physician Assistant

## 2023-09-27 ENCOUNTER — Other Ambulatory Visit: Payer: Self-pay | Admitting: Physician Assistant

## 2023-09-27 ENCOUNTER — Encounter: Payer: Self-pay | Admitting: Family Medicine

## 2023-09-27 LAB — TSH: TSH: 0.39 u[IU]/mL (ref 0.35–5.50)

## 2023-09-27 NOTE — Telephone Encounter (Signed)
 It appears that she sent a message through my chart that mentioned her dosage. Typically I could take one 600mg  tablet up to 3x a day if needed

## 2023-09-27 NOTE — Telephone Encounter (Signed)
 I was able to reach out to the endocrinolgy office(spoke with Mariah Shines) and have a message sent to the provider in reference to the request. I also sent in the last office notes to them as well. In addition they wanted me to advise you that the patient is scheduled to be seen there on 10/04/23.

## 2023-09-27 NOTE — Telephone Encounter (Signed)
 Requested.

## 2023-09-28 ENCOUNTER — Other Ambulatory Visit: Payer: Self-pay | Admitting: Family Medicine

## 2023-09-28 MED ORDER — TRAMADOL HCL 50 MG PO TABS: 50.0000 mg | ORAL_TABLET | Freq: Three times a day (TID) | ORAL | 1 refills | Status: DC | PRN

## 2023-09-28 NOTE — Telephone Encounter (Signed)
 Sent. Thanks.

## 2023-09-28 NOTE — Telephone Encounter (Signed)
 Noted. Thanks.

## 2023-09-30 ENCOUNTER — Ambulatory Visit: Payer: Self-pay | Admitting: Family Medicine

## 2023-10-03 ENCOUNTER — Other Ambulatory Visit: Payer: Self-pay | Admitting: Family Medicine

## 2023-10-03 ENCOUNTER — Encounter: Payer: Self-pay | Admitting: *Deleted

## 2023-10-03 DIAGNOSIS — R634 Abnormal weight loss: Secondary | ICD-10-CM

## 2023-10-03 DIAGNOSIS — G8929 Other chronic pain: Secondary | ICD-10-CM

## 2023-10-04 ENCOUNTER — Other Ambulatory Visit: Payer: Self-pay

## 2023-10-04 DIAGNOSIS — I10 Essential (primary) hypertension: Secondary | ICD-10-CM | POA: Diagnosis not present

## 2023-10-04 DIAGNOSIS — E785 Hyperlipidemia, unspecified: Secondary | ICD-10-CM | POA: Diagnosis not present

## 2023-10-04 DIAGNOSIS — R636 Underweight: Secondary | ICD-10-CM | POA: Diagnosis not present

## 2023-10-04 DIAGNOSIS — E1169 Type 2 diabetes mellitus with other specified complication: Secondary | ICD-10-CM | POA: Diagnosis not present

## 2023-10-04 DIAGNOSIS — E1165 Type 2 diabetes mellitus with hyperglycemia: Secondary | ICD-10-CM | POA: Diagnosis not present

## 2023-10-04 DIAGNOSIS — Z1331 Encounter for screening for depression: Secondary | ICD-10-CM | POA: Diagnosis not present

## 2023-10-04 MED ORDER — MOUNJARO 10 MG/0.5ML ~~LOC~~ SOAJ
10.0000 mg | SUBCUTANEOUS | 3 refills | Status: DC
  Filled 2023-10-04: qty 2, 28d supply, fill #0
  Filled 2023-10-29: qty 2, 28d supply, fill #1
  Filled 2023-11-28 – 2023-12-17 (×3): qty 2, 28d supply, fill #2
  Filled 2024-01-09: qty 2, 28d supply, fill #3

## 2023-10-12 ENCOUNTER — Telehealth: Payer: Self-pay

## 2023-10-12 NOTE — Telephone Encounter (Signed)
 Called pt to let her know CT scan was not covered by insurance. Pt did not answer, Left message on Voice mail.

## 2023-10-18 ENCOUNTER — Encounter: Payer: Self-pay | Admitting: Family Medicine

## 2023-10-18 ENCOUNTER — Ambulatory Visit
Admission: RE | Admit: 2023-10-18 | Discharge: 2023-10-18 | Disposition: A | Discharge status: Home / Self Care | Source: Ambulatory Visit | Attending: Family Medicine | Admitting: Family Medicine

## 2023-10-18 DIAGNOSIS — N3289 Other specified disorders of bladder: Secondary | ICD-10-CM | POA: Diagnosis not present

## 2023-10-18 DIAGNOSIS — R634 Abnormal weight loss: Secondary | ICD-10-CM

## 2023-10-18 DIAGNOSIS — K8020 Calculus of gallbladder without cholecystitis without obstruction: Secondary | ICD-10-CM | POA: Diagnosis not present

## 2023-10-18 DIAGNOSIS — G8929 Other chronic pain: Secondary | ICD-10-CM

## 2023-10-18 MED ORDER — IOPAMIDOL (ISOVUE-300) INJECTION 61%
100.0000 mL | Freq: Once | INTRAVENOUS | Status: AC | PRN
  Administered 2023-10-18: 100 mL via INTRAVENOUS

## 2023-10-18 NOTE — Telephone Encounter (Signed)
 Please call pt about getting an OV in the near future.  Thanks.

## 2023-10-18 NOTE — Telephone Encounter (Signed)
 Do you want to see patient for visit?

## 2023-10-21 ENCOUNTER — Ambulatory Visit: Payer: Self-pay | Admitting: Family Medicine

## 2023-10-23 ENCOUNTER — Other Ambulatory Visit: Payer: Self-pay | Admitting: Family Medicine

## 2023-10-23 ENCOUNTER — Ambulatory Visit: Admitting: Family Medicine

## 2023-10-23 VITALS — BP 102/60 | HR 82 | Temp 98.7°F | Ht 65.25 in | Wt 123.0 lb

## 2023-10-23 DIAGNOSIS — R102 Pelvic and perineal pain: Secondary | ICD-10-CM | POA: Diagnosis not present

## 2023-10-23 DIAGNOSIS — G8929 Other chronic pain: Secondary | ICD-10-CM | POA: Diagnosis not present

## 2023-10-23 DIAGNOSIS — R8290 Unspecified abnormal findings in urine: Secondary | ICD-10-CM

## 2023-10-23 DIAGNOSIS — R9389 Abnormal findings on diagnostic imaging of other specified body structures: Secondary | ICD-10-CM | POA: Diagnosis not present

## 2023-10-23 LAB — POC URINALSYSI DIPSTICK (AUTOMATED)
Bilirubin, UA: NEGATIVE
Blood, UA: NEGATIVE
Glucose, UA: POSITIVE — AB
Ketones, UA: NEGATIVE
Leukocytes, UA: NEGATIVE
Nitrite, UA: NEGATIVE
Protein, UA: NEGATIVE
Spec Grav, UA: 1.01 (ref 1.010–1.025)
Urobilinogen, UA: 0.2 U/dL
pH, UA: 6 (ref 5.0–8.0)

## 2023-10-23 MED ORDER — TRAMADOL HCL 50 MG PO TABS: 50.0000 mg | ORAL_TABLET | Freq: Three times a day (TID) | ORAL | 0 refills | Status: DC | PRN

## 2023-10-23 NOTE — Patient Instructions (Signed)
 Let me check with endocrine and I'll work on the referral to general surgery.   We'll update you about the urine culture.   Take care.  Glad to see you.

## 2023-10-23 NOTE — Progress Notes (Unsigned)
 He had eaten about 3 hours prior to CT.   IMPRESSION: 1. Mild symmetric wall thickening of an incompletely distended urinary bladder. Correlate with urinalysis to exclude cystitis. 2. Stomach is markedly distended with ingested material and gas without focal wall thickening, suggest correlation for recent meal ingestion. 3. Colonic stool burden compatible with constipation. 4. Cholelithiasis without evidence of acute cholecystitis.   U/a d/w pt.  Ucx pending.   She has 1 area in the liver that could have a fatty deposit versus variable perfusion.  This does not appear alarming.   Her stomach was distended.  Had she eaten prior to the scan?  If she had not eaten then we need to see about potentially stopping Mounjaro /evaluating her for gastroparesis.  Please let me know about that.   She did have significant stool in her colon, i.e. constipation.   She has gallstones which may or may not contribute to her symptoms.   She had thickening of the bladder wall that could have been related to cystitis.  Let me know if she is having any dysuria.  She needs a follow-up urinalysis at an office visit.    I do not yet see her urology records.  Please see about getting a copy of that.   She needs an office visit so we can talk about potentially seeing GI, potentially stopping Mounjaro , potentially seeing general surgery.  Thanks.      Has had abd pain for 11-12 years.  Has had diabetes for 5 years.    She had biopsy proven endometriosis.     ===================  Appetite improved on lower dose of mounjaro .     No prev gastroparesis dx.  She may need to come off mounjaro .    Constipation- mounjaro .   She had gall stones.  Mother had gall stones and cholecystectomy.    Ucx pending.   I do not yet see her urology records.  Please see about getting a copy of that.  Still with R>L sided abd pain.  She can have cramping that radiates down into the R thigh.  This is similar to prev  menstrual cramps.      D/w pt about colace 100mg .       ================ She is starting moving next week, d/w pt.   D/w pt about her daughter.   Patient is currently separated, since 06/2021.     D/w pt about tramadol  vs gabapentin  use for abd pain.     2-3 months of L sided neck pain. She questioned if she slept wrong.  Now more recently with some positional pain now, with sitting.     RLQ more than RUQ ttp, no rebound.   Normal BS RRR CTAB

## 2023-10-24 ENCOUNTER — Telehealth: Payer: Self-pay | Admitting: Family Medicine

## 2023-10-24 LAB — URINE CULTURE
MICRO NUMBER:: 16671191
Result:: NO GROWTH
SPECIMEN QUALITY:: ADEQUATE

## 2023-10-24 NOTE — Assessment & Plan Note (Signed)
 Differential discussed with patient.  She had gastric enlargement on CT.  Some of this could have been related to eating prior to the CT but she also has likely constipation.  She does not have a diagnosis of gastroparesis.  She is on Mounjaro .  Discussed that she would likely need to decrease and then stop Mounjaro  before she could have gastroparesis evaluation.  See following phone note.  Will ask endocrinology about decreased and stopping Mounjaro .  Refer to general surgery for consideration of HIDA/hepatobiliary scan given her gallstones.  Urine culture pending.  I am going to route this note to Dr. Fredirick with gynecology.  Patient has follow-up pending with her.  I need Dr. Alonzo input about endometriosis potentially contributing to her pain.  Discussed that she still may end up needing pelvic floor therapy.  Would use tramadol  versus gabapentin  as needed in the meantime for abdominal pain.  At this point still okay for outpatient follow-up.

## 2023-10-24 NOTE — Telephone Encounter (Signed)
 Please call endocrinology and please send a copy of my most recent office visit note.  She had gastric enlargement on CT. I suspect she is going to need to decrease and then stop Mounjaro .  I would like endocrinology to help with that.  Thanks.

## 2023-10-25 NOTE — Telephone Encounter (Signed)
 Office notes sent to endocrinology. Will try to reach out to office tomorrow.

## 2023-10-26 NOTE — Telephone Encounter (Signed)
 Reached out to endocrinology and spoke with Darice in reference to notes that was faxed yesterday. Darice states that the notes would not be with her but she just wants me to call back on Monday to further review.

## 2023-10-28 ENCOUNTER — Ambulatory Visit: Payer: Self-pay | Admitting: Family Medicine

## 2023-10-28 DIAGNOSIS — G8929 Other chronic pain: Secondary | ICD-10-CM

## 2023-10-31 NOTE — Telephone Encounter (Signed)
 Reached out to endo and they are awaiting a call back from the patient.

## 2023-10-31 NOTE — Telephone Encounter (Signed)
 Noted. Thanks.

## 2023-11-01 ENCOUNTER — Ambulatory Visit (INDEPENDENT_AMBULATORY_CARE_PROVIDER_SITE_OTHER): Payer: Self-pay | Admitting: General Surgery

## 2023-11-01 ENCOUNTER — Encounter: Payer: Self-pay | Admitting: General Surgery

## 2023-11-01 VITALS — BP 118/85 | HR 83 | Ht 65.0 in | Wt 120.0 lb

## 2023-11-01 DIAGNOSIS — G8929 Other chronic pain: Secondary | ICD-10-CM | POA: Diagnosis not present

## 2023-11-01 DIAGNOSIS — R1031 Right lower quadrant pain: Secondary | ICD-10-CM

## 2023-11-01 DIAGNOSIS — R110 Nausea: Secondary | ICD-10-CM | POA: Diagnosis not present

## 2023-11-01 DIAGNOSIS — R1032 Left lower quadrant pain: Secondary | ICD-10-CM

## 2023-11-01 NOTE — Patient Instructions (Addendum)
 We send a referral to Gastroenterology for them to see you and investigate what may be going on as far as your digestion.  They will call you to schedule this appointment.   Slow Stomach Emptying (Gastroparesis): What to Know  Gastroparesis happens when food takes longer than normal to empty from the stomach. This is also known as delayed gastric emptying. It's usually a long-term or chronic health problem. There is no cure, but there're treatments and things that you can do at home to improve symptoms. What are the causes? In many cases, the cause of this problem is not known. Possible causes may be: Damage to the nerve that controls the stomach muscles. Medicines. What increases the risk? You're more likely to have slow stomach emptying if: You have certain problems or diseases. These may include: A hormone disorder, such as hypothyroidism or diabetes. A connective tissue problem, such as scleroderma. A nervous system disease, such as Parkinson's disease or multiple sclerosis. Cancer with radiation therapy or a surgery that affects the stomach and its nerves. You're overweight or obese. You've had surgery on your stomach. You take medicines that slow stomach emptying. You're female. What are the signs or symptoms? Symptoms may come and go. Some people may not notice any symptoms. Symptoms include: No desire to eat, or feeling full after eating very little. Throwing up or feeling like you may throw up. Bloating in your belly. Change in your blood sugar when tested. Losing weight without trying, or without knowing why. Heartburn. This is caused by acid from the stomach coming up into the esophagus. Sudden tightness of the stomach, which can be painful. This is called a spasm. How is this diagnosed? This problem is diagnosed with tests that check how long it takes for food to move through the stomach and intestines. Tests include: Upper gastrointestinal (GI) series. You drink a liquid  that shows up well on X-rays, and then X-rays are taken of your intestines. Gastric emptying scintigraphy. You eat food that has a small amount of radioactive material, and then scans are taken of your stomach. Capsule endoscopy. You swallow a pill (capsule) that records information about how foods and fluid move through your stomach. Manometry. A tube is passed down your throat and into your stomach to measure electrical and muscular activity. You may also have other tests, such as endoscopy and ultrasound. How is this treated? There is no cure for slow stomach emptying. But treatment and home care may relieve symptoms. Treatment may include: Treating the problem that caused the condition in the first place. Making changes to your diet and exercise. Taking medicines to: Reduce the feeling that you may throw up. Prevent throwing up. Make the stomach muscles work better. Having surgery to insert a device called a gastric electrical stimulator into your body. This device helps improve stomach emptying. If you have very bad symptoms, you may need to be hospitalized to get food through a feeding tube. Follow these instructions at home: Eat and drink as told. You may be told to: Eat smaller meals more often. Eat low-fat foods. Eat low-fiber forms of high-fiber foods. For example, eat cooked vegetables instead of raw vegetables. Have only liquid foods instead of solid foods. Liquid foods are easier to digest. Take your medicines only as told. Drink more fluids as told. Exercise as told. Contact a health care provider if: You notice that your symptoms don't get better with treatment. You have new symptoms. You feel like you're going to throw up  all the time. Get help right away if: You have very bad pain in your belly. You throw up every time you eat or drink. This information is not intended to replace advice given to you by your health care provider. Make sure you discuss any questions you  have with your health care provider. Document Revised: 03/09/2023 Document Reviewed: 03/09/2023 Elsevier Patient Education  2025 [employer].  Cholelithiasis  Cholelithiasis happens when gallstones form in the gallbladder. The gallbladder stores bile. Bile is a fluid that helps digest fats. Bile can harden and form into gallstones. If they cause a blockage, they can cause pain (gallbladder attack). What are the causes? This condition may be caused by: Too much bilirubin in the bile. This happens if you have sickle cell anemia. Too much of a fat-like substance (cholesterol) in your bile. Not enough bile salts in your bile. These salts help the body absorb and digest fats. The gallbladder not emptying fully or often enough. This is common in pregnant women. What increases the risk? The following factors may make you more likely to develop this condition: Being older than age 65. Eating a lot of fried foods, fat, and refined carbs (refined carbohydrates). Being female. Being pregnant many times. Using medicines with female hormones in them for a long time. Losing weight fast. Having gallstones in your family. Having health problems, such as diabetes, obesity, Crohn's disease, or liver disease. What are the signs or symptoms? Often, there may be gallstones but no symptoms. These gallstones are called silent gallstones. If a gallstone causes a blockage, you may get sudden pain. The pain: Can be in the upper right part of your belly (abdomen). Normally comes at night or after you eat. Can last an hour or more. Can spread to your right shoulder, back, or chest. Can feel like discomfort, burning, or fullness in the upper part of your belly (indigestion). If the blockage lasts more than a few hours, you can get an infection or swelling. You may: Vomit or feel like you may vomit (nauseous). Feel bloated. Have belly pain for 5 hours or more. Feel tender in your belly, often in the upper  right part and under your ribs. Have a fever or chills. Have skin or the white parts of your eyes turn yellow (jaundice). Have dark pee (urine) or pale poop (stool). How is this treated? Treatment for this condition depends on how bad you feel. If you have symptoms, you may need: Home care, if symptoms are not very bad. Do not eat for 12-24 hours. Drink only water and clear liquids. After 1 or 2 days, start to eat simple or clear foods. Try broth and crackers. You may need medicines for pain or stomach upset or both. If you have an infection, you will need antibiotics. A hospital stay, if you have very bad pain or a very bad infection. Surgery to remove your gallbladder. You may need this if: Gallstones keep coming back. You have very bad symptoms. Medicines to break up gallstones. Medicines may be used for 6-12 months. A procedure to find and take out gallstones or to break up gallstones. Follow these instructions at home: Medicines Take over-the-counter and prescription medicines only as told by your doctor. If you were prescribed antibiotics, take them as told by your doctor. Do not stop taking them even if you start to feel better. Ask your doctor if you should avoid driving or using machines while you are taking your medicine. Eating and drinking Drink enough  fluid to keep your pee pale yellow. Drink water or clear fluids. This is important when you have pain. Eat healthy foods. Choose: Fewer fatty foods, such as fried foods. Fewer refined carbs. Avoid breads and grains that are highly processed, such as white bread and white rice. Choose whole grains, such as whole-wheat bread and brown rice. More fiber. Almonds, fresh fruit, and beans are healthy sources. General instructions Keep a healthy weight. Keep all follow-up visits. You may need to see a specialist or a Careers adviser. Where to find more information [employer] of Diabetes and Digestive and Kidney Diseases:  [email or web address] Contact a doctor if: You have sudden pain in the upper right part of your belly. Pain might spread to your right shoulder, back, or chest. Your pain lasts more than 2 hours. You have been diagnosed with gallstones that have no symptoms and you get: Belly pain. Discomfort, burning, or fullness in the upper part of your abdomen. You keep feeling like you may vomit. You have dark pee or pale poop. Get help right away if: You have pain in your abdomen, that: Lasts more than 5 hours. Keeps getting worse. You have a fever or chills. You can't stop vomiting. Your skin or the white parts of your eyes turn yellow. This information is not intended to replace advice given to you by your health care provider. Make sure you discuss any questions you have with your health care provider. Document Revised: 01/16/2022 Document Reviewed: 01/16/2022 Elsevier Patient Education  2024 [employer].

## 2023-11-05 ENCOUNTER — Other Ambulatory Visit: Payer: Self-pay | Admitting: Family Medicine

## 2023-11-05 ENCOUNTER — Other Ambulatory Visit: Payer: Self-pay

## 2023-11-05 ENCOUNTER — Encounter: Payer: Self-pay | Admitting: Family Medicine

## 2023-11-07 NOTE — Telephone Encounter (Signed)
 Reviewed [employer] Endo sent in #20 tramadol  on 11/06/2023.

## 2023-11-07 NOTE — Progress Notes (Signed)
 Patient ID: [name], female   DOB: [birth year], 43 y.o.   MRN: [id number] CC: Abdominal Pain History of Present Illness [name] is a 43 y.o. female with past medical history as below significant for diabetes as well as history of endometriosis who presents in consultation for abdominal pain.  The patient reports that in the past she has had history of endometriosis and has had multiple surgeries for this.  Her last surgery was about a year ago and there was no evidence of endometriosis deposits.  However, she reports that she has developed vague abdominal pain.  She said the abdominal pain is worse in her right lower quadrant.  She says that sometimes when she has a bowel movement this pain is improved.  She also reports that she gets nauseated frequently after eating and feels like things are not moving through.  She does say sometimes that she has episodes of emesis.  She denies any postprandial right upper quadrant pain only the nausea.  She has lost a significant amount of weight with the combination of being on GLP-1 agonist as well as with the persistent nausea that she has.  She does report that at first they thought this nausea was from the medication so they switch her to Mounjaro .  They have also recently reduced her dose of Mounjaro .  She denies any episodes of jaundice.  Past Medical History Past Medical History:  Diagnosis Date   Abnormal Pap smear    Allergy    Anxiety    Diet-controlled hyperlipidemia    Dyshidrotic eczema    Endometriosis    Family history of hemochromatosis    mother    HA (headache)    otc med prn   Hypertension    Migraines    PONV (postoperative nausea and vomiting)    Pregnancy induced hypertension 2008   Type 2 diabetes mellitus treated with insulin  [facility])        Past Surgical History:  Procedure Laterality Date   ABDOMINAL HYSTERECTOMY  2016   ABLATION ON ENDOMETRIOSIS N/A 06/04/2014   Procedure: ABLATION ON ENDOMETRIOSIS;   Surgeon: Lynwood FORBES Curlene PONCE, MD;  Location: [facility];  Service: Gynecology;  Laterality: N/A;   BIOPSY  12/01/2011   Procedure: BIOPSY;  Surgeon: Lynwood FORBES Curlene PONCE, MD;  Location: [facility];  Service: Gynecology;  Laterality: N/A;   CESAREAN SECTION  2008   CESAREAN SECTION WITH BILATERAL TUBAL LIGATION N/A 12/02/2012   Procedure: Repeat Cesarean Section Delivery Baby Girl @ 1826, Apgars 8/9, Bilateral Tubal Ligation;  Surgeon: Lynwood FORBES Curlene PONCE, MD;  Location: [facility];  Service: Obstetrics;  Laterality: N/A;   COLPOSCOPY     as teenager, results WNL   ENDOMETRIAL BIOPSY     LAPAROSCOPIC ASSISTED VAGINAL HYSTERECTOMY  2016   with bilateral salpingectomy   LAPAROSCOPIC BILATERAL SALPINGECTOMY Bilateral 02/15/2015   Procedure: LAPAROSCOPIC BILATERAL SALPINGECTOMY;  Surgeon: Archie Savers, MD;  Location: [facility];  Service: Gynecology;  Laterality: Bilateral;   LAPAROSCOPIC LYSIS OF ADHESIONS N/A 06/04/2014   Procedure: LAPAROSCOPIC LYSIS OF ADHESIONS;  Surgeon: Lynwood FORBES Curlene PONCE, MD;  Location: [facility];  Service: Gynecology;  Laterality: N/A;   LAPAROSCOPIC SALPINGO OOPHERECTOMY Right 07/17/2016   Procedure: LAPAROSCOPIC RIGHT SALPINGO OOPHORECTOMY;  Surgeon: Archie Savers, MD;  Location: [facility];  Service: Gynecology;  Laterality: Right;   LAPAROSCOPY  12/01/2011   Procedure: LAPAROSCOPY OPERATIVE;  Surgeon: Lynwood FORBES Curlene PONCE, MD;  Location: [facility];  Service: Gynecology;  Laterality: N/A;  LAPAROSCOPY N/A 06/04/2014   Procedure: LAPAROSCOPY OPERATIVE;  Surgeon: Lynwood FORBES Curlene PONCE, MD;  Location: [facility];  Service: Gynecology;  Laterality: N/A;   LAPAROSCOPY N/A 02/14/2016   Procedure: LAPAROSCOPY DIAGNOSTIC WITH BIOPSIES POSSIBLE ADHESIOLYSIS;  Surgeon: Archie Savers, MD;  Location: [facility];  Service: Gynecology;  Laterality: N/A;   LAPAROSCOPY N/A 05/26/2016   Procedure: LAPAROSCOPY operative with lysis of adhesions;  Surgeon: Alm Lynwood Sar, MD;  Location: [facility];  Service: Gynecology;   Laterality: N/A;   OOPHORECTOMY Left    TONSILLECTOMY     TUBAL LIGATION     TYMPANOSTOMY TUBE PLACEMENT      Allergies  Allergen Reactions   Sulfamethoxazole-Trimethoprim Swelling and Rash   Azithromycin Rash   Cefprozil Rash and Other (See Comments)   Ceftin [Cefuroxime Axetil] Rash   Other Itching    CHG-wipes she had to use prior to surgery.  Ok to use CHG wash without problems    Current Outpatient Medications  Medication Sig Dispense Refill   amLODipine  (NORVASC ) 10 MG tablet TAKE 1 TABLET BY MOUTH DAILY 90 tablet 1   atorvastatin (LIPITOR) 20 MG tablet TAKE ONE TABLET BY MOUTH EVERY DAY 90 tablet 1   Continuous Glucose Sensor (DEXCOM G7 SENSOR) MISC USE 1 SENSOR EVERY 10 DAYS AS DIRECTED     dapagliflozin propanediol (FARXIGA) 10 MG TABS tablet Take 10 mg by mouth daily.     docusate sodium  (COLACE) 100 MG capsule Take 100 mg by mouth daily.     estradiol  (ESTRACE ) 1 MG tablet TAKE ONE TABLET BY MOUTH EVERY DAY 90 tablet 3   gabapentin  (NEURONTIN ) 600 MG tablet Take 1 tablet (600 mg total) by mouth 3 (three) times daily as needed. 90 tablet 1   gemfibrozil (LOPID) 600 MG tablet Take by mouth.     insulin  glargine (LANTUS ) 100 UNIT/ML injection Inject 28 Units into the skin daily.     losartan  (COZAAR ) 50 MG tablet Take 1 tablet (50 mg total) by mouth daily. 90 tablet 3   metFORMIN (GLUCOPHAGE-XR) 750 MG 24 hr tablet Take 750 mg by mouth 2 (two) times daily.     metoprolol  succinate (TOPROL -XL) 50 MG 24 hr tablet TAKE 1 TABLET BY MOUTH DAILY. TAKE IMMEDIATELY AFTER A MEAL 90 tablet 1   tirzepatide  (MOUNJARO ) 10 MG/0.5ML Pen Inject 10 mg into the skin once a week. 2 mL 3   traMADol  (ULTRAM ) 50 MG tablet Take 1-2 tablets (50-100 mg total) by mouth 3 (three) times daily as needed for moderate pain (pain score 4-6) or severe pain (pain score 7-10). 40 tablet 0   No current facility-administered medications for this visit.    Family History Family History  Problem Relation Age  of Onset   Hypertension Mother    Hemochromatosis Mother    Diabetes Father    Hypertension Father    Ovarian cancer Maternal Grandmother    Lung cancer Maternal Grandfather    Brain cancer Maternal Grandfather    CVA Paternal Grandmother    Heart attack Paternal Grandmother    Kidney disease Paternal Grandfather    Cancer Paternal Grandfather    Breast cancer Paternal Aunt    Coronary artery disease Paternal Uncle    Colon cancer Neg Hx        Social History Social History   Tobacco Use   Smoking status: Never    Passive exposure: Never   Smokeless tobacco: Never  Vaping Use   Vaping status: Never Used  Substance Use  Topics   Alcohol use: Yes    Comment: occasional   Drug use: No        ROS Full ROS of systems performed and is otherwise negative there than what is stated in the HPI  Physical Exam Blood pressure 118/85, pulse 83, height 5' 5 (1.651 m), weight 120 lb (54.4 kg), last menstrual period 10/15/2013, SpO2 98%.  Alert and oriented x 3, normal work of breathing on room air, regular rate and rhythm, abdomen soft, nontender nondistended, laparoscopic port sites are healed well, no rebound tenderness or Murphy sign.  Mood and affect appropriate, moving all extremities spontaneously  Data Reviewed I have independently reviewed her labs.  These are consistent with a normal creatinine as well as a normal alkaline phosphatase.  Her ALT is slightly elevated at 46 but her total bilirubin is normal.  Her CBC is normal.  I independently reviewed her CT scan.  There does seem to be some stones and sludge in the gallbladder but does not look markedly distended or have a thickened wall.  She does have a very large distended stomach.  I have personally reviewed the patient's imaging and medical records.    Assessment    43 year old female with right lower quadrant abdominal pain and also post prandial nausea without any postprandial pain.  She was referred to the office  for possible biliary etiology of her symptoms.  Plan    I had a long discussion about the pathophysiology of biliary pain and problems.  I discussed with her that she does not have classic symptoms of biliary colic as she has no postprandial pain.  I also discussed with her that if you did imaging of a significant portion of the population then they would find evidence of cholelithiasis.  While this may be a nonclassical presentation of biliary problems I think that she should have more workup and input from specialist to determine if there are other problems that may be causing her symptoms.  On my differential includes delayed gastric emptying especially given her diabetes as well as a large distended stomach on imaging.  This also could be secondary to her medications as she did find some relief with drop in the dose of her Bongiorno.  I also am wondering if there is some element of irritable bowel syndrome as she does report that she is her right lower quadrant pain does improve sometimes with bowel movements.  I discussed with her that my fear would be doing surgery and she does not get any relief from the surgical intervention.  At this time I think it is most appropriate that we have a GI physician weigh in for some of her nonspecific abdominal pain symptoms.  She may need an endoscopy versus colonoscopy.  If further workup is negative then I am happy to see her back to discuss any role for surgery.   A total of 45 minutes was spent reviewing the patient's chart, performing the interval history and physical and discussing treatment options with the patient    Jayson MALVA Endow 11/07/2023, 7:32 AM

## 2023-11-09 ENCOUNTER — Other Ambulatory Visit: Payer: Self-pay

## 2023-11-11 NOTE — Telephone Encounter (Signed)
 Noted. Thanks.

## 2023-11-22 ENCOUNTER — Other Ambulatory Visit: Payer: Self-pay | Admitting: Family Medicine

## 2023-11-23 ENCOUNTER — Encounter: Payer: Self-pay | Admitting: Family Medicine

## 2023-11-26 ENCOUNTER — Other Ambulatory Visit: Payer: Self-pay | Admitting: Physician Assistant

## 2023-11-26 ENCOUNTER — Other Ambulatory Visit: Payer: Self-pay

## 2023-11-26 DIAGNOSIS — R051 Acute cough: Secondary | ICD-10-CM

## 2023-11-26 DIAGNOSIS — J029 Acute pharyngitis, unspecified: Secondary | ICD-10-CM

## 2023-11-26 LAB — POC COVID19 BINAXNOW: SARS Coronavirus 2 Ag: NEGATIVE

## 2023-11-26 NOTE — Progress Notes (Signed)
 Called clinic around 4:30 pm requesting to be tested for covid.  S/Sx started Friday/Saturday: Sore throat - able to swallow w/o difficulty Cough - Dry Headache T = 99.0 (checked with oral thermometer)  Taking OTC Dayquil/Nyquil  States main reason requesting to be tested was at [facility] ED last Monday with a family member.

## 2023-11-27 ENCOUNTER — Telehealth: Payer: Self-pay

## 2023-11-27 ENCOUNTER — Other Ambulatory Visit: Payer: Self-pay

## 2023-11-27 MED ORDER — HYDROCOD POLI-CHLORPHE POLI ER 10-8 MG/5ML PO SUER
5.0000 mL | Freq: Two times a day (BID) | ORAL | 0 refills | Status: DC
Start: 2023-11-27 — End: 2024-01-16

## 2023-11-27 NOTE — Telephone Encounter (Signed)
 Good morning,  I am more stopped up today, having congestion mostly in my nose. Harder to breathe out one side vs another. Anything you or the provider there can suggest? Throat is still sore but not worse.   Thank you!!  Delon Cliche [place] [place] Phone: [phone] Email: [email or web address]

## 2023-11-28 ENCOUNTER — Other Ambulatory Visit: Payer: Self-pay | Admitting: Family Medicine

## 2023-11-28 MED ORDER — POLYETHYLENE GLYCOL 3350 17 GM/SCOOP PO POWD: 17.0000 g | Freq: Every day | ORAL | Status: DC

## 2023-12-03 ENCOUNTER — Other Ambulatory Visit: Payer: Self-pay | Admitting: Family Medicine

## 2023-12-03 NOTE — Telephone Encounter (Signed)
 LOV: 10/23/23 NOV: nothing scheduled Last Refill: traMADol  (ULTRAM ) 50 MG tablet 10/23/23 40 tablets 0 refills

## 2023-12-03 NOTE — Telephone Encounter (Signed)
 Sent. Thanks.

## 2023-12-10 ENCOUNTER — Other Ambulatory Visit: Payer: Self-pay

## 2023-12-11 ENCOUNTER — Encounter: Admitting: Family Medicine

## 2023-12-13 ENCOUNTER — Other Ambulatory Visit: Payer: Self-pay | Admitting: Family Medicine

## 2023-12-24 ENCOUNTER — Other Ambulatory Visit: Payer: Self-pay | Admitting: Family Medicine

## 2023-12-27 ENCOUNTER — Ambulatory Visit: Attending: Family Medicine | Admitting: Physical Therapy

## 2023-12-31 ENCOUNTER — Other Ambulatory Visit: Payer: Self-pay | Admitting: Family Medicine

## 2024-01-01 ENCOUNTER — Other Ambulatory Visit: Payer: Self-pay | Admitting: Family Medicine

## 2024-01-01 ENCOUNTER — Encounter: Payer: Self-pay | Admitting: Family Medicine

## 2024-01-01 LAB — HM DIABETES EYE EXAM

## 2024-01-01 NOTE — Telephone Encounter (Signed)
 Refill requested for Tramadol  sent to [facility]. Pt next office visit is 9/19

## 2024-01-04 ENCOUNTER — Ambulatory Visit: Admitting: Family Medicine

## 2024-01-14 ENCOUNTER — Other Ambulatory Visit: Payer: Self-pay | Admitting: Physician Assistant

## 2024-01-15 ENCOUNTER — Other Ambulatory Visit: Payer: Self-pay | Admitting: Family Medicine

## 2024-01-15 NOTE — Telephone Encounter (Signed)
 Called patient let her know that we have sent in refill on 01/01/24. When I was on the phone with patient she checked and she did have them she just put in wrong place. Refill is not needed.

## 2024-01-16 ENCOUNTER — Ambulatory Visit: Payer: Self-pay | Admitting: Physician Assistant

## 2024-01-16 ENCOUNTER — Encounter: Payer: Self-pay | Admitting: Physician Assistant

## 2024-01-16 DIAGNOSIS — J020 Streptococcal pharyngitis: Secondary | ICD-10-CM

## 2024-01-16 LAB — POCT RAPID STREP A (OFFICE): Rapid Strep A Screen: POSITIVE — AB

## 2024-01-16 LAB — POCT INFLUENZA A/B
Influenza A, POC: NEGATIVE
Influenza B, POC: NEGATIVE

## 2024-01-16 LAB — POC COVID19 BINAXNOW: SARS Coronavirus 2 Ag: NEGATIVE

## 2024-01-16 MED ORDER — HYDROCOD POLI-CHLORPHE POLI ER 10-8 MG/5ML PO SUER: 5.0000 mL | Freq: Two times a day (BID) | ORAL | 0 refills | Status: DC

## 2024-01-16 MED ORDER — AMOXICILLIN-POT CLAVULANATE 875-125 MG PO TABS: 1.0000 | ORAL_TABLET | Freq: Two times a day (BID) | ORAL | 0 refills | Status: DC

## 2024-01-16 NOTE — Progress Notes (Signed)
 Pt presents today with 3 days of runny nose, congestion, cough and sore throat. Daughter had same thing week prior and tested negative for strep flu an covid.

## 2024-01-16 NOTE — Progress Notes (Signed)
   Subjective: Cough, runny nose, and sore throat..    Patient ID: [name], female    DOB: 05-16-1980, 43 y.o.   MRN: [id number]  HPI Patient presents with 3 days of runny nose, congestion, cough, sore throat.  Patient states daughter had the same symptoms last week but tested negative for 4 COVID, influenza, and strep.  Denies fever/chills with complaint.  Denies nausea,   Review of Systems Allergic rhinitis, diabetes, and hypertension.    Objective:   Physical Exam  Deferred secondary to being a telephonic encounter.  Patient tested positive for strep pharyngitis in parking lot of this clinic.      Assessment & Plan: Strep pharyngitis   Patient given a prescription for Augmentin  and Tussionex.  Advised to follow-up if no improvement or worsening complaints.

## 2024-01-23 ENCOUNTER — Other Ambulatory Visit: Payer: Self-pay | Admitting: Family Medicine

## 2024-01-25 ENCOUNTER — Other Ambulatory Visit: Payer: Self-pay | Admitting: Family Medicine

## 2024-01-25 ENCOUNTER — Encounter: Payer: Self-pay | Admitting: Family Medicine

## 2024-01-25 NOTE — Telephone Encounter (Signed)
 Last OV: 10/23/23 Pending OV: 02/07/24 Medication: Tramadol  50mg  Directions: Take 1-2 tablets TID prn  Last Refill: 01/02/24 Qty: #40 with 1 refill

## 2024-01-31 ENCOUNTER — Ambulatory Visit: Admitting: General Surgery

## 2024-02-04 ENCOUNTER — Other Ambulatory Visit: Payer: Self-pay

## 2024-02-04 MED ORDER — MOUNJARO 10 MG/0.5ML ~~LOC~~ SOAJ
10.0000 mg | SUBCUTANEOUS | 3 refills | Status: AC
  Filled 2024-02-04 – 2024-02-15 (×2): qty 2, 28d supply, fill #0

## 2024-02-07 ENCOUNTER — Ambulatory Visit: Admitting: Family Medicine

## 2024-02-11 ENCOUNTER — Encounter: Payer: Self-pay | Admitting: Physician Assistant

## 2024-02-11 ENCOUNTER — Other Ambulatory Visit: Payer: Self-pay | Admitting: Physician Assistant

## 2024-02-12 ENCOUNTER — Encounter: Payer: Self-pay | Admitting: Family Medicine

## 2024-02-12 ENCOUNTER — Ambulatory Visit (INDEPENDENT_AMBULATORY_CARE_PROVIDER_SITE_OTHER): Admitting: Family Medicine

## 2024-02-12 VITALS — BP 114/82 | HR 82 | Temp 97.6°F | Resp 16 | Ht 65.0 in | Wt 125.2 lb

## 2024-02-12 DIAGNOSIS — J0141 Acute recurrent pansinusitis: Secondary | ICD-10-CM | POA: Diagnosis not present

## 2024-02-12 DIAGNOSIS — J029 Acute pharyngitis, unspecified: Secondary | ICD-10-CM

## 2024-02-12 DIAGNOSIS — R051 Acute cough: Secondary | ICD-10-CM | POA: Diagnosis not present

## 2024-02-12 LAB — POCT RAPID STREP A (OFFICE): Rapid Strep A Screen: NEGATIVE

## 2024-02-12 LAB — POCT INFLUENZA A/B
Influenza A, POC: NEGATIVE
Influenza B, POC: NEGATIVE

## 2024-02-12 LAB — POC COVID19 BINAXNOW: SARS Coronavirus 2 Ag: NEGATIVE

## 2024-02-12 MED ORDER — DOXYCYCLINE HYCLATE 100 MG PO TABS: 100.0000 mg | ORAL_TABLET | Freq: Two times a day (BID) | ORAL | 0 refills | Status: DC

## 2024-02-12 MED ORDER — PROMETHAZINE-DM 6.25-15 MG/5ML PO SYRP: 2.5000 mL | ORAL_SOLUTION | Freq: Four times a day (QID) | ORAL | 0 refills | Status: DC | PRN

## 2024-02-12 NOTE — Progress Notes (Signed)
 Acute Office Visit  Introduced to nurse practitioner role and practice setting.  All questions answered.  Discussed provider/patient relationship and expectations.   Subjective:     Patient ID: [name], female    DOB: 1980/06/11, 43 y.o.   MRN: [id number]  Chief Complaint  Patient presents with   Sore Throat    Complains of sore throat, cough, sinus pressure ongoing 2 days otc: allegra, robitussin, delsym. Had some tessalon  pearls left over from previous sick visit and has taken those as well.    Discussed the use of AI scribe software for clinical note transcription with the patient, who gave verbal consent to proceed.  History of Present Illness [name] is a 43 year old female with who presents with sore throat and congestion.  She has been experiencing a sore throat and congestion that began over the weekend and has progressively worsened. She describes significant facial and head pressure, accompanied by a severe headache. Additionally, she has a cough that worsens when lying down, particularly at night, affecting her sleep. She has been using over-the-counter medications such as Allegra D, Robitussin, and Delsym for her symptoms, and has Tessalon  Perles from a previous prescription.  About 3.5 weeks ago , she had a strep throat infection treated with Augmentin  and Tessalon  Perles.Hx of having her tonsils removed as a child due to frequent strep infections, she continues to experience recurrent episodes. She notes having had strep throat several times over the years.  No fever, difficulty breathing, chest pain, palpitations, rash, difficulty eating, nausea, vomiting, diarrhea, or constipation. Her daughter used a COVID test on her boyfriend, which was negative, and some people at her workplace have been sick recently. Cough worst at night time.  HPI  ROS      Objective:    BP 114/82   Pulse 82   Temp 97.6 F (36.4 C) (Oral)   Resp 16   Ht 5' 5  (1.651 m)   Wt 125 lb 3.2 oz (56.8 kg)   LMP 10/15/2013   SpO2 96%   BMI 20.83 kg/m    Physical Exam Constitutional:      General: She is not in acute distress.    Appearance: She is well-developed and normal weight. She is not ill-appearing, toxic-appearing or diaphoretic.  HENT:     Head: Normocephalic.     Right Ear: Ear canal normal. No drainage, swelling or tenderness. No middle ear effusion. Tympanic membrane is scarred. Tympanic membrane is not injected, erythematous or bulging.     Left Ear: Ear canal normal. No drainage, swelling or tenderness.  No middle ear effusion. Tympanic membrane is injected and scarred. Tympanic membrane is not erythematous or bulging.     Nose: Congestion and rhinorrhea present.     Right Turbinates: Not enlarged or swollen.     Left Turbinates: Not enlarged or swollen.     Right Sinus: Maxillary sinus tenderness and frontal sinus tenderness present.     Left Sinus: Maxillary sinus tenderness and frontal sinus tenderness present.     Mouth/Throat:     Mouth: Mucous membranes are moist. No oral lesions.     Pharynx: Uvula midline. Posterior oropharyngeal erythema present. No pharyngeal swelling, oropharyngeal exudate, uvula swelling or postnasal drip.     Tonsils: No tonsillar exudate or tonsillar abscesses. 0 on the right. 0 on the left.  Eyes:     Extraocular Movements:     Right eye: Normal extraocular motion.     Left  eye: Normal extraocular motion.     Conjunctiva/sclera: Conjunctivae normal.     Pupils: Pupils are equal, round, and reactive to light.  Cardiovascular:     Rate and Rhythm: Normal rate and regular rhythm.     Heart sounds: Normal heart sounds. No murmur heard.    No gallop.  Pulmonary:     Effort: Pulmonary effort is normal. No respiratory distress.     Breath sounds: Normal breath sounds. No stridor. No wheezing, rhonchi or rales.  Chest:     Chest wall: No tenderness.  Abdominal:     Palpations: Abdomen is soft.   Lymphadenopathy:     Cervical: No cervical adenopathy.  Skin:    General: Skin is warm and dry.     Capillary Refill: Capillary refill takes less than 2 seconds.  Neurological:     General: No focal deficit present.     Mental Status: She is alert and oriented to person, place, and time.  Psychiatric:        Mood and Affect: Mood normal.        Behavior: Behavior normal.     Results for orders placed or performed in visit on 02/12/24  POCT rapid strep A  Result Value Ref Range   Rapid Strep A Screen Negative Negative  POCT Influenza A/B  Result Value Ref Range   Influenza A, POC Negative Negative   Influenza B, POC Negative Negative  POC COVID-19  Result Value Ref Range   SARS Coronavirus 2 Ag Negative Negative      Assessment & Plan:  Assessment and Plan Assessment & Plan Acute URI - Appears to have developed pansinusitis - pharyngitis and cough - congestion, facial pressure, headache, and cough worsening when supine.  - Negative for COVID-19, strep and influenza. - recent strep infection early October -  Examination reveals pharyngeal and mild ear erythema without significant drainage or lesions. - viral vs bacterial in nature - given recent strep and viral symptoms starting last week, moderate tenderness to frontal and maxillary sinuses will prescribe doxycycline  (recently on Augmentin  for strep) - Prescribe doxycycline  100 mg twice daily for 7 days. - Advise symptomatic management with Flonase , hot tea with honey, and gargling with warm salt water. - Recommend over-the-counter medications for symptom relief, including acetaminophen  and ibuprofen  for pain management. - Prescribe promethazine  DM for cough, as well as continue benzonatate  prn, patient left over from previous infection - Advise increasing fluid intake to at least 60 ounces per day and include electrolytes. - Instruct to follow up if symptoms persist or worsen in 48-72 hours, or if fever or lethargy  develop.     Problem List Items Addressed This Visit   None Visit Diagnoses       Sore throat    -  Primary   Relevant Orders   POCT rapid strep A (Completed)     Acute cough       Relevant Medications   promethazine -dextromethorphan (PROMETHAZINE -DM) 6.25-15 MG/5ML syrup   Other Relevant Orders   POCT Influenza A/B (Completed)   POC COVID-19 (Completed)     Acute recurrent pansinusitis       Relevant Medications   doxycycline  (VIBRA -TABS) 100 MG tablet   promethazine -dextromethorphan (PROMETHAZINE -DM) 6.25-15 MG/5ML syrup       Meds ordered this encounter  Medications   doxycycline  (VIBRA -TABS) 100 MG tablet    Sig: Take 1 tablet (100 mg total) by mouth 2 (two) times daily.    Dispense:  14 tablet  Refill:  0   promethazine -dextromethorphan (PROMETHAZINE -DM) 6.25-15 MG/5ML syrup    Sig: Take 2.5 mLs by mouth 4 (four) times daily as needed for cough.    Dispense:  118 mL    Refill:  0    No follow-ups on file.  Curtis DELENA Boom, FNP  I, Curtis DELENA Boom, FNP, have reviewed all documentation for this visit. The documentation on 02/12/24 for the exam, diagnosis, procedures, and orders are all accurate and complete.

## 2024-02-13 ENCOUNTER — Ambulatory Visit: Admitting: Family Medicine

## 2024-02-14 ENCOUNTER — Other Ambulatory Visit: Payer: Self-pay

## 2024-02-15 ENCOUNTER — Other Ambulatory Visit: Payer: Self-pay | Admitting: Family Medicine

## 2024-02-15 ENCOUNTER — Encounter: Payer: Self-pay | Admitting: Family Medicine

## 2024-02-15 ENCOUNTER — Other Ambulatory Visit: Payer: Self-pay

## 2024-02-15 MED ORDER — HYDROCOD POLI-CHLORPHE POLI ER 10-8 MG/5ML PO SUER: 5.0000 mL | Freq: Two times a day (BID) | ORAL | 0 refills | Status: DC | PRN

## 2024-02-20 ENCOUNTER — Other Ambulatory Visit: Payer: Self-pay | Admitting: Family Medicine

## 2024-02-20 NOTE — Telephone Encounter (Signed)
 I declined this.  She needs to get rechecked.  Please see about getting her scheduled re: cough and re: abd pain.

## 2024-02-22 ENCOUNTER — Ambulatory Visit: Admitting: Family Medicine

## 2024-02-22 VITALS — BP 114/78 | HR 85 | Temp 97.5°F | Ht 65.0 in | Wt 133.0 lb

## 2024-02-22 DIAGNOSIS — R0981 Nasal congestion: Secondary | ICD-10-CM

## 2024-02-22 MED ORDER — HYDROCOD POLI-CHLORPHE POLI ER 10-8 MG/5ML PO SUER: 5.0000 mL | Freq: Two times a day (BID) | ORAL | 0 refills | Status: DC | PRN

## 2024-02-22 MED ORDER — PREDNISONE 10 MG PO TABS: ORAL_TABLET | ORAL | 0 refills | Status: DC

## 2024-02-22 NOTE — Progress Notes (Signed)
 Patient ID: [name], female    DOB: 1980-12-05, 43 y.o.   MRN: [id number]  This visit was conducted in person.  BP 114/78   Pulse 85   Temp (!) 97.5 F (36.4 C) (Oral)   Ht 5' 5 (1.651 m)   Wt 133 lb (60.3 kg)   LMP 10/15/2013   SpO2 99%   BMI 22.13 kg/m    CC:  Chief Complaint  Patient presents with   Follow-up    F/U on sinus infection from last week. Finished antibiotic, still sinus congestion and cough.     Subjective:   HPI: [name] is a 43 y.o. female presenting on 02/22/2024 for Follow-up (F/U on sinus infection from last week. Finished antibiotic, still sinus congestion and cough. )   Saw Burnard Boom, FNP for sore throat on February 04, 2024 Negative COVID, flu and rapid strep test. Treated with doxycycline  100 mg p.o. twice daily x 10 days for likely pansinusitis sinusitis, pharyngitis and cough  Of note she has recently been on Augmentin  for strep 01/17/2024  Symptoms resolved.  Today she reports continued sinus congestion and cough.  She feels mucus has broken up, less pressure.  Lingering dry cough, does have post nasal drip.  Occ productive cough yellowish clear.  No fever. NO wheeze or SOB.  No history of  COPD/Asthma   She has tried flonase  off and on.     Relevant past medical, surgical, family and social history reviewed and updated as indicated. Interim medical history since our last visit reviewed. Allergies and medications reviewed and updated. Outpatient Medications Prior to Visit  Medication Sig Dispense Refill   amLODipine  (NORVASC ) 10 MG tablet TAKE 1 TABLET BY MOUTH DAILY 90 tablet 0   atorvastatin (LIPITOR) 20 MG tablet TAKE ONE TABLET BY MOUTH EVERY DAY 90 tablet 1   Continuous Glucose Sensor (DEXCOM G7 SENSOR) MISC USE 1 SENSOR EVERY 10 DAYS AS DIRECTED     dapagliflozin propanediol (FARXIGA) 10 MG TABS tablet Take 10 mg by mouth daily.     docusate sodium  (COLACE) 100 MG capsule Take 100 mg by mouth daily.      estradiol  (ESTRACE ) 1 MG tablet TAKE ONE TABLET BY MOUTH EVERY DAY 90 tablet 3   gemfibrozil (LOPID) 600 MG tablet Take by mouth.     insulin  glargine (LANTUS ) 100 UNIT/ML injection Inject 28 Units into the skin daily.     losartan  (COZAAR ) 50 MG tablet Take 1 tablet (50 mg total) by mouth daily. 90 tablet 3   metFORMIN (GLUCOPHAGE-XR) 750 MG 24 hr tablet Take 750 mg by mouth 2 (two) times daily.     metoprolol  succinate (TOPROL -XL) 50 MG 24 hr tablet TAKE 1 TABLET BY MOUTH DAILY. TAKE IMMEDIATELY AFTER A MEAL 90 tablet 1   polyethylene glycol powder (GLYCOLAX /MIRALAX ) 17 GM/SCOOP powder Take 17 g by mouth daily.     tirzepatide  (MOUNJARO ) 10 MG/0.5ML Pen Inject 10 mg into the skin once a week. 2 mL 3   gabapentin  (NEURONTIN ) 600 MG tablet TAKE 1 TABLET BY MOUTH THREE TIMES DAILYAS NEEDED 90 tablet 0   traMADol  (ULTRAM ) 50 MG tablet Take 1-2 tablets (50-100 mg total) by mouth 3 (three) times daily as needed. 40 tablet 1   chlorpheniramine-HYDROcodone  (TUSSIONEX) 10-8 MG/5ML Take 5 mLs by mouth every 12 (twelve) hours as needed for cough (Sedation caution.  Do not take with tramadol .). 50 mL 0   doxycycline  (VIBRA -TABS) 100 MG tablet Take 1 tablet (100  mg total) by mouth 2 (two) times daily. 14 tablet 0   promethazine -dextromethorphan (PROMETHAZINE -DM) 6.25-15 MG/5ML syrup Take 2.5 mLs by mouth 4 (four) times daily as needed for cough. 118 mL 0   No facility-administered medications prior to visit.     Per HPI unless specifically indicated in ROS section below Review of Systems  Constitutional:  Negative for fatigue and fever.  HENT:  Negative for congestion.   Eyes:  Negative for pain.  Respiratory:  Negative for cough and shortness of breath.   Cardiovascular:  Negative for chest pain, palpitations and leg swelling.  Gastrointestinal:  Negative for abdominal pain.  Genitourinary:  Negative for dysuria and vaginal bleeding.  Musculoskeletal:  Negative for back pain.  Neurological:   Negative for syncope, light-headedness and headaches.  Psychiatric/Behavioral:  Negative for dysphoric mood.    Objective:  BP 114/78   Pulse 85   Temp (!) 97.5 F (36.4 C) (Oral)   Ht 5' 5 (1.651 m)   Wt 133 lb (60.3 kg)   LMP 10/15/2013   SpO2 99%   BMI 22.13 kg/m   Wt Readings from Last 3 Encounters:  02/22/24 133 lb (60.3 kg)  02/12/24 125 lb 3.2 oz (56.8 kg)  11/01/23 120 lb (54.4 kg)      Physical Exam Constitutional:      General: She is not in acute distress.    Appearance: Normal appearance. She is well-developed. She is not ill-appearing or toxic-appearing.  HENT:     Head: Normocephalic.     Right Ear: Hearing, tympanic membrane, ear canal and external ear normal. Tympanic membrane is not erythematous, retracted or bulging.     Left Ear: Hearing, tympanic membrane, ear canal and external ear normal. Tympanic membrane is not erythematous, retracted or bulging.     Nose: No mucosal edema or rhinorrhea.     Right Turbinates: Swollen.     Left Turbinates: Swollen.     Right Sinus: Maxillary sinus tenderness present. No frontal sinus tenderness.     Left Sinus: Maxillary sinus tenderness present. No frontal sinus tenderness.     Mouth/Throat:     Pharynx: Uvula midline.  Eyes:     General: Lids are normal. Lids are everted, no foreign bodies appreciated.     Conjunctiva/sclera: Conjunctivae normal.     Pupils: Pupils are equal, round, and reactive to light.  Neck:     Thyroid : No thyroid  mass or thyromegaly.     Vascular: No carotid bruit.     Trachea: Trachea normal.  Cardiovascular:     Rate and Rhythm: Normal rate and regular rhythm.     Pulses: Normal pulses.     Heart sounds: Normal heart sounds, S1 normal and S2 normal. No murmur heard.    No friction rub. No gallop.  Pulmonary:     Effort: Pulmonary effort is normal. No tachypnea or respiratory distress.     Breath sounds: Normal breath sounds. No decreased breath sounds, wheezing, rhonchi or rales.   Abdominal:     General: Bowel sounds are normal.     Palpations: Abdomen is soft.     Tenderness: There is no abdominal tenderness.  Musculoskeletal:     Cervical back: Normal range of motion and neck supple.  Skin:    General: Skin is warm and dry.     Findings: No rash.  Neurological:     Mental Status: She is alert.  Psychiatric:        Mood and Affect: Mood is  not anxious or depressed.        Speech: Speech normal.        Behavior: Behavior normal. Behavior is cooperative.        Thought Content: Thought content normal.        Judgment: Judgment normal.       Results for orders placed or performed in visit on 02/12/24  POCT rapid strep A   Collection Time: 02/12/24 10:47 AM  Result Value Ref Range   Rapid Strep A Screen Negative Negative  POCT Influenza A/B   Collection Time: 02/12/24 10:47 AM  Result Value Ref Range   Influenza A, POC Negative Negative   Influenza B, POC Negative Negative  POC COVID-19   Collection Time: 02/12/24 10:47 AM  Result Value Ref Range   SARS Coronavirus 2 Ag Negative Negative   *Note: Due to a large number of results and/or encounters for the requested time period, some results have not been displayed. A complete set of results can be found in Results Review.    Assessment and Plan  Sinus congestion Assessment & Plan: Persistent following recent sinus infection.  No current sign of bacterial infection. Will treat with prednisone  taper and cough suppressant.  Continue nasal saline irrigation  Return and ER precautions provided.   Other orders -     predniSONE ; 3 tabs by mouth daily x 3 days, then 2 tabs by mouth daily x 2 days then 1 tab by mouth daily x 2 days  Dispense: 15 tablet; Refill: 0 -     Hydrocod Poli-Chlorphe Poli ER; Take 5 mLs by mouth every 12 (twelve) hours as needed for cough (Sedation caution.  Do not take with tramadol .).  Dispense: 50 mL; Refill: 0    No follow-ups on file.   Greig Ring, MD

## 2024-02-25 ENCOUNTER — Other Ambulatory Visit: Payer: Self-pay | Admitting: Family Medicine

## 2024-02-26 ENCOUNTER — Other Ambulatory Visit (HOSPITAL_COMMUNITY): Payer: Self-pay

## 2024-02-26 ENCOUNTER — Telehealth: Payer: Self-pay

## 2024-02-26 NOTE — Telephone Encounter (Signed)
 Pharmacy Patient Advocate Encounter   Received notification from Onbase that prior authorization for Tussionex is required/requested.   Insurance verification completed.   The patient is insured through [facility].   Spoke with RPH at [facility]. Patient picked up rx on 02/22/24 and paid out of pocket $40.28

## 2024-02-27 ENCOUNTER — Encounter: Payer: Self-pay | Admitting: Family Medicine

## 2024-02-28 DIAGNOSIS — R0981 Nasal congestion: Secondary | ICD-10-CM | POA: Insufficient documentation

## 2024-02-28 NOTE — Assessment & Plan Note (Signed)
 Persistent following recent sinus infection.  No current sign of bacterial infection. Will treat with prednisone  taper and cough suppressant.  Continue nasal saline irrigation  Return and ER precautions provided.

## 2024-03-06 ENCOUNTER — Encounter: Payer: Self-pay | Admitting: Family Medicine

## 2024-03-06 ENCOUNTER — Ambulatory Visit: Admission: EM | Admit: 2024-03-06 | Discharge: 2024-03-06 | Disposition: A | Discharge status: Home / Self Care

## 2024-03-06 ENCOUNTER — Encounter: Payer: Self-pay | Admitting: General Surgery

## 2024-03-06 ENCOUNTER — Ambulatory Visit: Payer: Self-pay

## 2024-03-06 DIAGNOSIS — I10 Essential (primary) hypertension: Secondary | ICD-10-CM | POA: Diagnosis not present

## 2024-03-06 DIAGNOSIS — K838 Other specified diseases of biliary tract: Secondary | ICD-10-CM | POA: Diagnosis not present

## 2024-03-06 DIAGNOSIS — R1011 Right upper quadrant pain: Secondary | ICD-10-CM | POA: Diagnosis not present

## 2024-03-06 DIAGNOSIS — Z7984 Long term (current) use of oral hypoglycemic drugs: Secondary | ICD-10-CM | POA: Diagnosis not present

## 2024-03-06 DIAGNOSIS — K8010 Calculus of gallbladder with chronic cholecystitis without obstruction: Secondary | ICD-10-CM | POA: Diagnosis not present

## 2024-03-06 DIAGNOSIS — K8020 Calculus of gallbladder without cholecystitis without obstruction: Secondary | ICD-10-CM | POA: Diagnosis not present

## 2024-03-06 DIAGNOSIS — E119 Type 2 diabetes mellitus without complications: Secondary | ICD-10-CM | POA: Diagnosis not present

## 2024-03-06 DIAGNOSIS — F419 Anxiety disorder, unspecified: Secondary | ICD-10-CM | POA: Diagnosis not present

## 2024-03-06 NOTE — ED Triage Notes (Signed)
 Pt being seen in UC for abdominal pain that started today. Pt reports some nausea beginning of this week. Pt spoke to PCP earlier today, PCP recommended pt come to UC, pt reports having problems with gallbladder. Pt reports abdominal pain is in mid abdominal area that radiates to R side. Pt reports using heating pad with no relief. Pt reports one episode of diarrhea today. Pt denies sick contacts.

## 2024-03-06 NOTE — ED Provider Notes (Addendum)
 [facility]    CSN: [id number] Arrival date & time: 03/06/24  8177      History   Chief Complaint Chief Complaint  Patient presents with   Abdominal Pain    HPI [name] is a 43 y.o. female.   HPI  44 year old female with past medical history significant for hyperlipidemia, hypertension, diabetes, dyshidrotic eczema, and headaches presents for evaluation of epigastric abdominal pain that radiates to the right.  It has been constant all day and is currently has a 6/10 but will go up to an 8/10 at maximum.  The patient had nausea earlier in the week but none today.  She has known gallstones.  Past Medical History:  Diagnosis Date   Abnormal Pap smear    Allergy    Anxiety    Diet-controlled hyperlipidemia    Dyshidrotic eczema    Endometriosis    Family history of hemochromatosis    mother    HA (headache)    otc med prn   Hypertension    Migraines    PONV (postoperative nausea and vomiting)    Pregnancy induced hypertension 2008   Type 2 diabetes mellitus treated with insulin  [facility])     Patient Active Problem List   Diagnosis Date Noted   Sinus congestion 02/28/2024   Abnormal weight loss 09/26/2023   Advance care planning 02/25/2023   Diabetes mellitus without complication (HCC) 02/25/2023   Pulsatile tinnitus 02/25/2023   Dyshidrotic eczema 12/17/2018   Subclinical hypothyroidism 12/17/2018   Subungual hematoma of toe 12/17/2018   Family history of hemochromatosis 11/27/2018   Osteoarthritis of spine with radiculopathy, lumbar region 06/20/2016   Viral URI 08/20/2015   Chronic pelvic pain in female 11/30/2014   Endometriosis 11/30/2014   Hypertension 09/01/2014   Anxiety state 12/11/2013   Routine general medical examination at a health care facility 04/06/2011   Insomnia 12/20/2007   Allergic rhinitis 07/18/2007   Other hyperlipidemia 02/19/2007    Past Surgical History:  Procedure Laterality Date   ABDOMINAL HYSTERECTOMY   2016   ABLATION ON ENDOMETRIOSIS N/A 06/04/2014   Procedure: ABLATION ON ENDOMETRIOSIS;  Surgeon: Lynwood FORBES Curlene PONCE, MD;  Location: [facility];  Service: Gynecology;  Laterality: N/A;   BIOPSY  12/01/2011   Procedure: BIOPSY;  Surgeon: Lynwood FORBES Curlene PONCE, MD;  Location: [facility];  Service: Gynecology;  Laterality: N/A;   CESAREAN SECTION  2008   CESAREAN SECTION WITH BILATERAL TUBAL LIGATION N/A 12/02/2012   Procedure: Repeat Cesarean Section Delivery Baby Girl @ 1826, Apgars 8/9, Bilateral Tubal Ligation;  Surgeon: Lynwood FORBES Curlene PONCE, MD;  Location: [facility];  Service: Obstetrics;  Laterality: N/A;   COLPOSCOPY     as teenager, results WNL   ENDOMETRIAL BIOPSY     LAPAROSCOPIC ASSISTED VAGINAL HYSTERECTOMY  2016   with bilateral salpingectomy   LAPAROSCOPIC BILATERAL SALPINGECTOMY Bilateral 02/15/2015   Procedure: LAPAROSCOPIC BILATERAL SALPINGECTOMY;  Surgeon: Archie Savers, MD;  Location: [facility];  Service: Gynecology;  Laterality: Bilateral;   LAPAROSCOPIC LYSIS OF ADHESIONS N/A 06/04/2014   Procedure: LAPAROSCOPIC LYSIS OF ADHESIONS;  Surgeon: Lynwood FORBES Curlene PONCE, MD;  Location: [facility];  Service: Gynecology;  Laterality: N/A;   LAPAROSCOPIC SALPINGO OOPHERECTOMY Right 07/17/2016   Procedure: LAPAROSCOPIC RIGHT SALPINGO OOPHORECTOMY;  Surgeon: Archie Savers, MD;  Location: [facility];  Service: Gynecology;  Laterality: Right;   LAPAROSCOPY  12/01/2011   Procedure: LAPAROSCOPY OPERATIVE;  Surgeon: Lynwood FORBES Curlene PONCE, MD;  Location: [facility];  Service: Gynecology;  Laterality: N/A;   LAPAROSCOPY N/A 06/04/2014   Procedure: LAPAROSCOPY OPERATIVE;  Surgeon: Lynwood FORBES Curlene PONCE, MD;  Location: [facility];  Service: Gynecology;  Laterality: N/A;   LAPAROSCOPY N/A 02/14/2016   Procedure: LAPAROSCOPY DIAGNOSTIC WITH BIOPSIES POSSIBLE ADHESIOLYSIS;  Surgeon: Archie Savers, MD;  Location: [facility];  Service: Gynecology;  Laterality: N/A;   LAPAROSCOPY N/A 05/26/2016   Procedure: LAPAROSCOPY operative with lysis of  adhesions;  Surgeon: Alm Lynwood Sar, MD;  Location: [facility];  Service: Gynecology;  Laterality: N/A;   OOPHORECTOMY Left    TONSILLECTOMY     TUBAL LIGATION     TYMPANOSTOMY TUBE PLACEMENT      OB History     Gravida  2   Para  2   Term  1   Preterm  1   AB      Living  2      SAB      IAB      Ectopic      Multiple      Live Births  2            Home Medications    Prior to Admission medications   Medication Sig Start Date End Date Taking? Authorizing Provider  amLODipine  (NORVASC ) 10 MG tablet TAKE 1 TABLET BY MOUTH DAILY 01/01/24   Cleatus Arlyss RAMAN, MD  atorvastatin (LIPITOR) 20 MG tablet TAKE ONE TABLET BY MOUTH EVERY DAY 09/16/23   Cleatus Arlyss RAMAN, MD  chlorpheniramine-HYDROcodone  (TUSSIONEX) 10-8 MG/5ML Take 5 mLs by mouth every 12 (twelve) hours as needed for cough (Sedation caution.  Do not take with tramadol .). Patient not taking: Reported on 03/06/2024 02/22/24   Avelina Greig FORBES, MD  Continuous Glucose Sensor (DEXCOM G7 SENSOR) MISC USE 1 SENSOR EVERY 10 DAYS AS DIRECTED 11/08/22   [provider]  dapagliflozin propanediol (FARXIGA) 10 MG TABS tablet Take 10 mg by mouth daily.    [provider]  docusate sodium  (COLACE) 100 MG capsule Take 100 mg by mouth daily. Patient not taking: Reported on 03/06/2024    [provider]  estradiol  (ESTRACE ) 1 MG tablet TAKE ONE TABLET BY MOUTH EVERY DAY 10/11/22   Savers Archie, MD  gabapentin  (NEURONTIN ) 600 MG tablet TAKE 1 TABLET BY MOUTH THREE TIMES DAILYAS NEEDED 02/27/24   Cleatus Arlyss RAMAN, MD  gemfibrozil (LOPID) 600 MG tablet Take by mouth. 07/16/20   [provider]  insulin  glargine (LANTUS ) 100 UNIT/ML injection Inject 28 Units into the skin daily.    [provider]  losartan  (COZAAR ) 50 MG tablet Take 1 tablet (50 mg total) by mouth daily. 02/25/23   Cleatus Arlyss RAMAN, MD  metFORMIN (GLUCOPHAGE-XR) 750 MG 24 hr tablet Take 750 mg by mouth 2 (two) times daily.  06/09/21   [provider]  metoprolol  succinate (TOPROL -XL) 50 MG 24 hr tablet TAKE 1 TABLET BY MOUTH DAILY. TAKE IMMEDIATELY AFTER A MEAL 09/12/23   Cleatus Arlyss RAMAN, MD  polyethylene glycol powder (GLYCOLAX /MIRALAX ) 17 GM/SCOOP powder Take 17 g by mouth daily. Patient not taking: Reported on 03/06/2024 11/28/23   Cleatus Arlyss RAMAN, MD  predniSONE  (DELTASONE ) 10 MG tablet 3 tabs by mouth daily x 3 days, then 2 tabs by mouth daily x 2 days then 1 tab by mouth daily x 2 days Patient not taking: Reported on 03/06/2024 02/22/24   Avelina Greig FORBES, MD  tirzepatide  (MOUNJARO ) 10 MG/0.5ML Pen Inject 10 mg into the skin once a week. 02/04/24  traMADol  (ULTRAM ) 50 MG tablet Take 1-2 tablets (50-100 mg total) by mouth 3 (three) times daily as needed. 02/27/24   Cleatus Arlyss RAMAN, MD    Family History Family History  Problem Relation Age of Onset   Hypertension Mother    Hemochromatosis Mother    Diabetes Father    Hypertension Father    Ovarian cancer Maternal Grandmother    Lung cancer Maternal Grandfather    Brain cancer Maternal Grandfather    CVA Paternal Grandmother    Heart attack Paternal Grandmother    Kidney disease Paternal Grandfather    Cancer Paternal Grandfather    Breast cancer Paternal Aunt    Coronary artery disease Paternal Uncle    Colon cancer Neg Hx     Social History Social History   Tobacco Use   Smoking status: Never    Passive exposure: Never   Smokeless tobacco: Never  Vaping Use   Vaping status: Never Used  Substance Use Topics   Alcohol use: Yes    Comment: rare   Drug use: No     Allergies   Sulfamethoxazole-trimethoprim, Azithromycin, Cefprozil, Ceftin [cefuroxime axetil], and Other   Review of Systems Review of Systems  Constitutional:  Negative for fever.  Gastrointestinal:  Positive for abdominal pain and nausea. Negative for vomiting.     Physical Exam Triage Vital Signs ED Triage Vitals [03/06/24 1834]  Encounter Vitals Group      BP      Girls Systolic BP Percentile      Girls Diastolic BP Percentile      Boys Systolic BP Percentile      Boys Diastolic BP Percentile      Pulse      Resp      Temp      Temp src      SpO2      Weight      Height      Head Circumference      Peak Flow      Pain Score 6     Pain Loc      Pain Education      Exclude from Growth Chart    No data found.  Updated Vital Signs BP 119/82 (BP Location: Right Arm)   Pulse 84   Temp 98.1 F (36.7 C) (Oral)   Resp 18   LMP 10/15/2013   SpO2 98%   Visual Acuity Right Eye Distance:   Left Eye Distance:   Bilateral Distance:    Right Eye Near:   Left Eye Near:    Bilateral Near:     Physical Exam Vitals and nursing note reviewed.  Constitutional:      Appearance: She is well-developed. She is ill-appearing.  HENT:     Head: Normocephalic and atraumatic.  Abdominal:     General: Abdomen is flat.     Palpations: Abdomen is soft.     Tenderness: There is abdominal tenderness. There is no right CVA tenderness, guarding or rebound.     Comments: Epigastric and right upper quadrant pain.  Positive Murphy sign.  Neurological:     Mental Status: She is alert.      UC Treatments / Results  Labs (all labs ordered are listed, but only abnormal results are displayed) Labs Reviewed - No data to display  EKG   Radiology No results found.  Procedures Procedures (including critical care time)  Medications Ordered in UC Medications - No data to display  Initial Impression / Assessment  and Plan / UC Course  I have reviewed the triage vital signs and the nursing notes.  Pertinent labs & imaging results that were available during my care of the patient were reviewed by me and considered in my medical decision making (see chart for details).   Patient is a pleasant, though mildly ill-appearing, 43 year old female presenting for evaluation of epigastric and right upper quadrant pain.  She has known gallstones that  she has been to see a careers adviser.  The surgeon referred her to gastroenterology but she does not have an appointment until December 1.  The pain began this morning and has intensified throughout the day.  She reports that she has not had much of an appetite but she did drink a protein shake and when she drank the shake it made the pain worse.  She is currently on Mounjaro  for her diabetes and reports that she is also lost a little over 100 pounds.  On exam her abdomen is soft and flat with marked epigastric and right upper quadrant tenderness and a positive Murphy sign on the right.  I am concerned that the patient has developed cholecystitis.  I discussed my concerns with the patient and advised her that she needs an ultrasound of her abdomen which we are unable to perform here in urgent care and I have referred her to the emergency department.  She will go to [facility].  Patient was determined to have symptomatic cholelithiasis with a planned cholecystectomy for Tuesday.   Final Clinical Impressions(s) / UC Diagnoses   Final diagnoses:  Right upper quadrant abdominal pain     Discharge Instructions      Please go to the emergency department to be evaluated for possible gallbladder issues.  Do not eat or drink anything until after you been seen in the ER.     ED Prescriptions   None    PDMP not reviewed this encounter.   Bernardino Ditch, NP 03/06/24 [name]    Bernardino Ditch, NP 03/08/24 [phone]

## 2024-03-06 NOTE — Discharge Instructions (Addendum)
 Please go to the emergency department to be evaluated for possible gallbladder issues.  Do not eat or drink anything until after you been seen in the ER.

## 2024-03-06 NOTE — Telephone Encounter (Signed)
 FYI Only or Action Required?: FYI only for provider: PT will go to UC this afternoon.  Patient was last seen in primary care on 02/22/2024 by Avelina Greig BRAVO, MD.  Called Nurse Triage reporting Abdominal Pain and Abdominal Cramping.  Symptoms began yesterday.  Interventions attempted: Ice/heat application.  Symptoms are: gradually worsening.  Triage Disposition: See HCP Within 4 Hours (Or PCP Triage)  Patient/caregiver understands and will follow disposition?: Yes                     Copied from CRM [id number]. Topic: Clinical - Red Word Triage >> Mar 06, 2024  2:22 PM Ivette P wrote: [facility] that prompted transfer to Nurse Triage: abdominal pain  Pt sent mychart message.   Hi Dr Cleatus, I've been having some pains today that are centered around my belly button area and a little to the right. It's been moderately crampy and comes in waves and is honestly getting worse. I didn't know if this could be related to the gallbladder that you saw on that ct scan and had me see Dr Marinda or not. I'm currently at work with the heating pad on it to try to help until I get off work. But I didn't know if this warranted something to be checked out or not. I did send Dr Marinda a message as well. Reason for Disposition  [1] MILD-MODERATE pain AND [2] constant AND [3] present > 2 hours  Answer Assessment - Initial Assessment Questions 1. LOCATION: Where does it hurt?      Around belly button and to the right 2. RADIATION: Does the pain shoot anywhere else? (e.g., chest, back)     To the right 3. ONSET: When did the pain begin? (e.g., minutes, hours or days ago)      Last night 4. SUDDEN: Gradual or sudden onset?     Sudden 5. PATTERN Does the pain come and go, or is it constant?     constant 6. SEVERITY: How bad is the pain?  (e.g., Scale 1-10; mild, moderate, or severe)     moderate 7. RECURRENT SYMPTOM: Have you ever had this type of stomach pain before? If  Yes, ask: When was the last time? and What happened that time?      no 8. CAUSE: What do you think is causing the stomach pain? (e.g., gallstones, recent abdominal surgery)     Gallbladder 9. RELIEVING/AGGRAVATING FACTORS: What makes it better or worse? (e.g., antacids, bending or twisting motion, bowel movement)     BM didn't help - actually got worse. 10. OTHER SYMPTOMS: Do you have any other symptoms? (e.g., back pain, diarrhea, fever, urination pain, vomiting)       diarrhea  Protocols used: Abdominal Pain - Female-A-AH

## 2024-03-07 DIAGNOSIS — E119 Type 2 diabetes mellitus without complications: Secondary | ICD-10-CM | POA: Diagnosis not present

## 2024-03-07 DIAGNOSIS — I10 Essential (primary) hypertension: Secondary | ICD-10-CM | POA: Diagnosis not present

## 2024-03-07 DIAGNOSIS — K8020 Calculus of gallbladder without cholecystitis without obstruction: Secondary | ICD-10-CM | POA: Diagnosis not present

## 2024-03-07 DIAGNOSIS — E785 Hyperlipidemia, unspecified: Secondary | ICD-10-CM | POA: Diagnosis not present

## 2024-03-08 DIAGNOSIS — N809 Endometriosis, unspecified: Secondary | ICD-10-CM | POA: Diagnosis not present

## 2024-03-08 DIAGNOSIS — K8020 Calculus of gallbladder without cholecystitis without obstruction: Secondary | ICD-10-CM | POA: Diagnosis not present

## 2024-03-08 DIAGNOSIS — E1165 Type 2 diabetes mellitus with hyperglycemia: Secondary | ICD-10-CM | POA: Diagnosis not present

## 2024-03-08 DIAGNOSIS — Z794 Long term (current) use of insulin: Secondary | ICD-10-CM | POA: Diagnosis not present

## 2024-03-09 DIAGNOSIS — E1165 Type 2 diabetes mellitus with hyperglycemia: Secondary | ICD-10-CM | POA: Diagnosis not present

## 2024-03-09 DIAGNOSIS — K8020 Calculus of gallbladder without cholecystitis without obstruction: Secondary | ICD-10-CM | POA: Diagnosis not present

## 2024-03-09 DIAGNOSIS — E785 Hyperlipidemia, unspecified: Secondary | ICD-10-CM | POA: Diagnosis not present

## 2024-03-09 DIAGNOSIS — F39 Unspecified mood [affective] disorder: Secondary | ICD-10-CM | POA: Diagnosis not present

## 2024-03-09 DIAGNOSIS — I10 Essential (primary) hypertension: Secondary | ICD-10-CM | POA: Diagnosis not present

## 2024-03-09 DIAGNOSIS — R197 Diarrhea, unspecified: Secondary | ICD-10-CM | POA: Diagnosis not present

## 2024-03-09 DIAGNOSIS — N809 Endometriosis, unspecified: Secondary | ICD-10-CM | POA: Diagnosis not present

## 2024-03-09 DIAGNOSIS — Z794 Long term (current) use of insulin: Secondary | ICD-10-CM | POA: Diagnosis not present

## 2024-03-09 DIAGNOSIS — F419 Anxiety disorder, unspecified: Secondary | ICD-10-CM | POA: Diagnosis not present

## 2024-03-10 ENCOUNTER — Ambulatory Visit: Admitting: Family Medicine

## 2024-03-10 NOTE — Discharge Summary (Signed)
 Discharge Summary  Admit date: 03/06/2024  Discharge date and time: 9:00 am 03/10/24  Discharge to:  Home  Discharge Service: Med Undesignated (MDX)  Discharge Attending Physician: Delon DELENA Lesser, MD  Discharge  Diagnoses: Symptomatic cholelithiasis  Secondary Diagnosis: Principal Problem:   Symptomatic cholelithiasis (POA: Yes) Resolved Problems:   * No resolved hospital problems. *   OR Procedures:   LAPAROSCOPIC CHOLECYSTECTOMY WITH CHOLANGIOGRAPHY Date 03/08/2024 -------------------   Ancillary Procedures: no procedures  Discharge Day Services:   Subjective  No acute events overnight.  Reports she is feeling okay.  She slept well last night.  She is passing gas, but no bowel movement.  She has been eating well.  Objective  Patient Vitals for the past 8 hrs:  BP Pulse  03/10/24 0818 107/75 59   No intake/output data recorded.  CONSTITUTIONAL: This is a well-appearing female in no acute distress, whose appearance is consistent with stated age.  EYES: Anicteric.  HEART/CARDIOVASCULAR: Normal rate. CHEST/PULMONARY:  Normal work of breathing on room air.  ABDOMEN/GASTROINTESTINAL: Soft, with diffuse tenderness.  Most pain in the epigastric region along the subxiphoid incision.  There is some mild swelling noted just beneath the excision, but no obvious hernia, or hematoma. SKIN: Postoperative sites are clean, dry, and intact NEUROLOGIC: Alert and oriented x3.  No focal deficits. LYMPHATICS: No palpable lymphadenopathy.   Hospital Course:  [name] is a 43 y.o. female with past medical history of type 2 diabetes, essential hypertension, and hyperlipidemia who presented to the emergency department on 03/06/2020 with complaints of abdominal pain.  Ultrasound imaging significant for cholelithiasis and common bile duct dilatation.  She was admitted to the general surgery service for symptomatic cholelithiasis.    The patient was taken to the OR on 03/08/2024   for laparoscopic cholecystectomy.  Operative findings were cholelithiasis with gallbladder wall sludge and mild inflammation.  The procedure itself was uneventful and without complications. She tolerated the procedure well, was extubated in the OR, and received routine post-operative care before being transferred to the floor.   On POD#0 she did have some difficulty with pain control with oral medications (tylenol , motrin , robaxin, and oxycodone ).  She did require prn dilaudid  to control her post operative pain.  She was able to ambulate to the BR with minimal assistance, and tolerated a regular diet on.   On POD #1, she continue to report uncontrolled pain despite scheduled Tylenol , Robaxin, ibuprofen , gabapentin , and as needed oxy/morphine .  Robaxin dosage was increased to 1000 mg Q6.  Lidocaine  patch was also added.  Blood glucose continues to be elevated with critical hide of 441 last night.  Home medications of metformin, Lantus  insulin , and Farxiga (pharm sub Jardiance) restarted.  Patient has been tolerating a regular diet.  She has been ambulatory to the bathroom independently.  She is passing urine, but reports is passing minimal gas and has not had return of bowel function since surgery.  On POD #2, states pain is better controlled.  Reports lidocaine  patch was not very helpful.  She is tolerating a regular diet well.  She has been passing flatus.   On the day of discharge, he was tolerating a regular diet, pain was controlled with oral medication, voiding spontaneously, and was ambulating with minimal assistance.   She was discharged in stable condition.   Follow-up in Dr. Genna clinic on March 25 2024.     Condition at Discharge: Improved Discharge Medications:    Medication List    START taking these medications   .  methocarbamol 500 MG tablet; Commonly known as: ROBAXIN; Take 2 tablets  (1,000 mg total) by mouth four (4) times a day for 7 days. . ondansetron  4 MG  disintegrating tablet; Commonly known as: ZOFRAN -ODT;  Dissolve 1 tablet (4 mg total) in the mouth every eight (8) hours as  needed. . oxyCODONE  5 MG immediate release tablet; Commonly known as: ROXICODONE ;  Take 1 tablet (5 mg total) by mouth every six (6) hours as needed for  pain.   CONTINUE taking these medications   . acetaminophen  325 MG tablet; Commonly known as: Tylenol ; Take 2 tablets  (650 mg total) by mouth every six (6) hours. . amlodipine  10 MG tablet; Commonly known as: NORVASC  . atorvastatin 20 MG tablet; Commonly known as: LIPITOR . estradiol  1 MG tablet; Commonly known as: ESTRACE ; TAKE ONE TABLET BY  MOUTH EVERY DAY . FARXIGA 10 mg Tab tablet; Generic drug: dapagliflozin propanediol . gabapentin  600 MG tablet; Commonly known as: NEURONTIN  . gemfibrozil 600 MG tablet; Commonly known as: LOPID . insulin  glargine 100 unit/mL injection; Commonly known as: LANTUS  . losartan  50 MG tablet; Commonly known as: COZAAR  . metFORMIN 750 MG 24 hr tablet; Commonly known as: GLUCOPHAGE-XR . metoPROLOL  succinate 50 MG 24 hr tablet; Commonly known as: Toprol -XL . MOUNJARO  10 mg/0.5 mL Pnij; Generic drug: tirzepatide    STOP taking these medications   . ondansetron  8 MG tablet; Commonly known as: ZOFRAN     Pending Test Results: None  Discharge Instructions: Activity:  Activity Instructions     Lifting restrictions (specify)     Weight restriction of 15 lbs.   OK to shower (no bath)        Diet: Diet Instructions     Discharge diet (specify)     Discharge Nutrition Therapy: Regular   Bland for 24hrs then advance as tolerated         Other Instructions: Other Instructions     Call MD for:  persistent nausea or vomiting     Call MD for:  redness, tenderness, or signs of infection (pain, swelling, redness, odor or green/yellow discharge around incision site)     Call MD for:  severe uncontrolled pain     Call MD for: Temperature > 38.5 Celsius ( > 101.3 Fahrenheit)      Discharge instructions     LAPAROSCOPIC CHOLECYSTECTOMY  Your surgery was done through 4 small incisions on your abdomen. The incisions were closed with suture under your skin (the suture absorbs and does not need to be removed) and covered in surgical glue that will fall off, usually in a week or two.  Do not scrub your incisions.    You may shower 48 hours (2 days) after the procedure, but please avoid baths, hot tubs, pools, soaking or sitting in water for 2 weeks.  Please allow the soap and water to run over your incision.  Do not scrub your incision.    You may eat the same night of your surgery.  We encourage you to eat light meals for the first 24 hours because the anesthesia may make you nauseated.  Please avoid fried or greasy foods for the first month or so after surgery.  Some individuals may experience loose bowel movements for several weeks after having their gallbladder out and this can be exacerbated by eating fried or greasy foods.  Do not lift anything heavy (over 15 pounds) and avoid very strenuous activity for 6 weeks post operatively.  However, listen to your body and if  what you are doing hurts, stop.  We do encourage you to walk after your surgery.  You should start small, ie - walking to the bathroom or the kitchen, and increase your walking daily.  Bruising and swelling are normal in the days following your operation.  If you notice excessive bruising or note visible swelling around your incision sites, you may call for advice and/or report to your nearest emergency department.  Other signs and symptoms that you should call the office about include a fever or greater than 101.25F, chills, yellowing of the skin or whites of the eyes, redness around the incisions, or your skin becoming hot around your incision, and any fluids coming out of your incisions.  You were prescribed an oral narcotic pain medication; you do not have to take it if you do not need it, or it can be taken  in a smaller dose and/or less often than written.  Alternatives to the prescription include Tylenol  650 mg every 6 hours and ibuprofen  800 mg every 8 hours; ibuprofen  and Tylenol  can be taken together and if taken with the prescription, will make the prescription work better so less of the prescription needs to be taken.  DO NOT TAKE TYLENOL  OR IBUPROFEN  IF YOU ARE ALLERGIC.  Do not drive or operate heavy machinery while taking narcotic pain medications.    DO NOT TAKE YOUR PREVIOUSLY PRESCRIBED TRAMADOL  WITH THE OXYCODONE .  You can't continue your gabapentin  as directed.   The oral pain medication has a constipating effect, so we strongly recommend using a fiber supplement (such as Metamucil, Citrucel, Benefiber) and/or a stool softener (such as Colace) with your pain medication.  Walking and adequate hydration will also help you to have soft bowel movements.  If you are having difficulty with bowel movements, use over-the-counter Milk of Magnesia, 30ml (1oz) daily.  If this does not produce the desired effect, you may increase this to 60ml (2oz) daily.  Drink 8oz of water following each dose for maximum benefit.  We would like to see you in our clinic for follow-up in 2 weeks.  Please schedule your follow-up appointment by calling the phone numbers below: Scheduling: [phone] RN / Clinic:  [phone]  If you have questions regarding your surgery during normal business hours, please call our RN/Clinic number at [phone].  After normal business hours, call [phone] and ask for the [facility] Surgery On Call.   In the case of an emergency after your surgery, we recommend you go to the nearest Emergency Department.   Patient to schedule follow-up appointment with surgeon in 1-2 weeks      Overnight Stay:  No need to make post op call as patient stayed overnight in the hospital.     Labs and Other Follow-ups after Discharge: Follow Up instructions and Outpatient  Referrals    Call MD for:  persistent nausea or vomiting     Call MD for:  redness, tenderness, or signs of infection (pain, swelling,  redness, odor or green/yellow discharge around incision site)     Call MD for:  severe uncontrolled pain     Call MD for: Temperature > 38.5 Celsius ( > 101.3 Fahrenheit)     Discharge instructions       Future Appointments: Appointments which have been scheduled for you    Mar 25, 2024 1:00 PM (Arrive by 12:40 PM) POST OP with Delon DELENA Lesser, MD [facility] GENERAL AND BARIATRIC SURGERY [place] [facility]) [address]  [address] [place] [address] [phone]        General and Acute Care Surgery Faculty: I have seen the patient with the PA, discussed the case, performed physical examination, reviewed imaging and labs and agree with above documentation.  Shaquia is feeling better today. She was able to sleep overnight, had better pain control, and has been tolerating regular diet. She has had better glucose control with all home meds restarted. She is stable for discharge to home and will follow-up in 1-2 weeks for post-op visit.  Delon Lesser, MD General and Acute Care Surgery

## 2024-03-11 ENCOUNTER — Telehealth: Payer: Self-pay

## 2024-03-11 NOTE — Transitions of Care (Post Inpatient/ED Visit) (Signed)
   03/11/2024  Name: [name] MRN: [id number] DOB: Apr 10, 1981  Today's TOC FU Call Status: Today's TOC FU Call Status:: Unsuccessful Call (1st Attempt) Unsuccessful Call (1st Attempt) Date: 03/11/24  Attempted to reach the patient regarding the most recent Inpatient/ED visit.  Follow Up Plan: Additional outreach attempts will be made to reach the patient to complete the Transitions of Care (Post Inpatient/ED visit) call.   Arvin Seip RN, BSN, CCM [employer], Population Health Case Manager Phone: [phone]

## 2024-03-12 ENCOUNTER — Telehealth: Payer: Self-pay

## 2024-03-12 NOTE — Transitions of Care (Post Inpatient/ED Visit) (Signed)
   03/12/2024  Name: AWILDA COVIN MRN: [id number] DOB: 1980-10-19  Today's TOC FU Call Status: Today's TOC FU Call Status:: Unsuccessful Call (2nd Attempt) Unsuccessful Call (2nd Attempt) Date: 03/12/24  Attempted to reach the patient regarding the most recent Inpatient/ED visit.  Follow Up Plan: Additional outreach attempts will be made to reach the patient to complete the Transitions of Care (Post Inpatient/ED visit) call.   Akili Cuda J. Seferina Brokaw RN, MSN [facility], [facility] RN Care Manager Direct Dial: [phone]  Fax: [phone] Website: [email or web address]

## 2024-03-17 ENCOUNTER — Other Ambulatory Visit: Payer: Self-pay

## 2024-03-17 ENCOUNTER — Other Ambulatory Visit: Payer: Self-pay | Admitting: Family Medicine

## 2024-03-17 DIAGNOSIS — Z9049 Acquired absence of other specified parts of digestive tract: Secondary | ICD-10-CM | POA: Diagnosis not present

## 2024-03-17 DIAGNOSIS — K30 Functional dyspepsia: Secondary | ICD-10-CM | POA: Diagnosis not present

## 2024-03-17 DIAGNOSIS — R1031 Right lower quadrant pain: Secondary | ICD-10-CM | POA: Diagnosis not present

## 2024-03-17 DIAGNOSIS — K5909 Other constipation: Secondary | ICD-10-CM | POA: Diagnosis not present

## 2024-03-17 DIAGNOSIS — R102 Pelvic and perineal pain unspecified side: Secondary | ICD-10-CM | POA: Diagnosis not present

## 2024-03-17 DIAGNOSIS — G8929 Other chronic pain: Secondary | ICD-10-CM | POA: Diagnosis not present

## 2024-03-19 ENCOUNTER — Encounter: Admitting: Family Medicine

## 2024-03-19 NOTE — Telephone Encounter (Signed)
 Last OV: 10/23/23 Pending OV: Nothing scheduled Medication: Tramadol  50mg  Directions: Take 1-2 tablets TID prn Last Refill: 02/27/24 Qty: #40 with 0 refills

## 2024-03-19 NOTE — Telephone Encounter (Deleted)
 Last OV: *** Pending OV: *** Medication: *** Directions: *** Last Refill: *** Qty: #*** with *** refills

## 2024-03-19 NOTE — Telephone Encounter (Signed)
 Please check with patient.  How is she doing?   My understanding from the general surgery note was to use gabapentin  for pain if needed and then call back to gen surgery re: possible oxycodone  use for post op pain.

## 2024-03-20 ENCOUNTER — Encounter: Payer: Self-pay | Admitting: Family Medicine

## 2024-03-20 NOTE — Telephone Encounter (Signed)
 Left message to return call to office. Ok to [employer] below.

## 2024-03-20 NOTE — Telephone Encounter (Signed)
 Last OV: 10/23/23 Pending OV: nothing sceduled Medication: traMADol  (ULTRAM ) 50 MG tablet  Last Refill: 02/27/2024 Qty: #40 with 0 refills

## 2024-03-20 NOTE — Telephone Encounter (Signed)
 See below, please check with patient and let me know about her status.  Thanks.

## 2024-03-20 NOTE — Telephone Encounter (Unsigned)
 Copied from CRM #[id number]. Topic: Clinical - Medication Question >> Mar 20, 2024  3:30 PM Robinson H wrote: Reason for CRM: Patient returning call to Amy, agent relayed message as stated from provider and patient stated she was not prescribed the gabapentin  and needs the pain medication for something else not surgery related, wouldn't tell agent.SABRA Nest [phone]

## 2024-03-21 MED ORDER — GABAPENTIN 300 MG PO CAPS: ORAL_CAPSULE | ORAL | 1 refills | Status: DC

## 2024-03-21 NOTE — Telephone Encounter (Signed)
 It looks like she had oxycodone  rx sent today per surgery clinic.   She could restart her gabapentin  to 300 mg daily for 2 days, increasing to 300 twice daily after 2 days, then she can increase to 3 times daily if needed.  I sent that.    I denied tramadol  given the above.  Thanks.

## 2024-03-25 ENCOUNTER — Other Ambulatory Visit: Payer: Self-pay | Admitting: Family Medicine

## 2024-03-25 MED ORDER — TRAMADOL HCL 50 MG PO TABS: 50.0000 mg | ORAL_TABLET | Freq: Three times a day (TID) | ORAL | 0 refills | Status: DC | PRN

## 2024-04-02 ENCOUNTER — Other Ambulatory Visit: Payer: Self-pay | Admitting: Family Medicine

## 2024-04-02 MED ORDER — ESTRADIOL 1 MG PO TABS: 1.0000 mg | ORAL_TABLET | Freq: Every day | ORAL | 0 refills | Status: AC

## 2024-04-03 ENCOUNTER — Other Ambulatory Visit: Payer: Self-pay | Admitting: Family Medicine

## 2024-04-03 DIAGNOSIS — R102 Pelvic and perineal pain unspecified side: Secondary | ICD-10-CM

## 2024-04-07 NOTE — Telephone Encounter (Signed)
 Lvm asking pt to call back. Pls relay Dr Elfredia message and schedule f/u OV for pain.

## 2024-04-07 NOTE — Telephone Encounter (Signed)
 Please update patient.  I denied this tramadol  rx in the meantime.  She has mult recent rx fills for oxycodone .  I wouldn't take that along with tramadol .  She needs OV when possible re: pain.  Thanks.

## 2024-04-07 NOTE — Telephone Encounter (Signed)
 Name of Medication:  Tramadol  Name of Pharmacy:  [facility] Last [place] or Written Date and Quantity:  03/25/24, #40 Last Office Visit and Type:  10/23/23, chronic female pelvic pain Next Office Visit and Type:  none Last Controlled Substance Agreement Date:  none Last UDS:  none

## 2024-04-15 ENCOUNTER — Encounter: Payer: Self-pay | Admitting: Family Medicine

## 2024-04-16 ENCOUNTER — Ambulatory Visit: Payer: Self-pay | Admitting: *Deleted

## 2024-04-16 ENCOUNTER — Telehealth: Admitting: Family

## 2024-04-16 ENCOUNTER — Encounter: Payer: Self-pay | Admitting: Family

## 2024-04-16 VITALS — Temp 99.5°F

## 2024-04-16 DIAGNOSIS — R509 Fever, unspecified: Secondary | ICD-10-CM

## 2024-04-16 DIAGNOSIS — R6889 Other general symptoms and signs: Secondary | ICD-10-CM

## 2024-04-16 MED ORDER — HYDROCOD POLI-CHLORPHE POLI ER 10-8 MG/5ML PO SUER: 5.0000 mL | Freq: Two times a day (BID) | ORAL | 0 refills | Status: DC

## 2024-04-16 MED ORDER — METOPROLOL SUCCINATE ER 50 MG PO TB24: 50.0000 mg | ORAL_TABLET | Freq: Every day | ORAL | 0 refills | Status: AC

## 2024-04-16 MED ORDER — OSELTAMIVIR PHOSPHATE 75 MG PO CAPS: 75.0000 mg | ORAL_CAPSULE | Freq: Two times a day (BID) | ORAL | 0 refills | Status: DC

## 2024-04-16 NOTE — Telephone Encounter (Signed)
 FYI Only or Action Required?: FYI only for provider: appointment scheduled on 04/16/24 and please advise if OV needed instead of VV, patient exposed to flu B.  Patient was last seen in primary care on 02/22/2024 by Avelina Greig BRAVO, MD.  Called Nurse Triage reporting Cough. Exposed to flu B   Symptoms began several days ago. Sunday   Interventions attempted: Rest, hydration, or home remedies.  Symptoms are: rapidly worsening.  Triage Disposition: See HCP Within 4 Hours (Or PCP Triage)  Patient/caregiver understands and will follow disposition?: Yes         Copied from CRM #[id number]. Topic: Clinical - Red Word Triage >> Apr 16, 2024  7:32 AM Victoria A wrote: [facility] that prompted transfer to Nurse Triage: Patient called said she has sore throat,chills, headaches, body aches, sinus pressure-onging since Sunday Reason for Disposition  [1] Fever > 100 F (37.8 C) AND [2] diabetes mellitus or weak immune system (e.g., HIV positive, cancer chemo, splenectomy, organ transplant, chronic steroids)    Low grade fever 99 or less  Answer Assessment - Initial Assessment Questions VV scheduled with provider due to sx and patient daughter tested positive for flu B Sunday. Patient tested self Monday and negative for flu. Sx started Sunday and now patient does not have any remaining tests for flu at home. Hx DM. Please advise if in person visit needed instead of VV.       1. ONSET: When did the cough begin?      Sunday  2. SEVERITY: How bad is the cough today?      Did not report 3. SPUTUM: Describe the color of your sputum (e.g., none, dry cough; clear, white, yellow, green)     Dry cough 4. HEMOPTYSIS: Are you coughing up any blood? If Yes, ask: How much? (e.g., flecks, streaks, tablespoons, etc.)     na 5. DIFFICULTY BREATHING: Are you having difficulty breathing? If Yes, ask: How bad is it? (e.g., mild, moderate, severe)      none 6. FEVER: Do you have a fever? If Yes,  ask: What is your temperature, how was it measured, and when did it start?     Low grade  7. CARDIAC HISTORY: Do you have any history of heart disease? (e.g., heart attack, congestive heart failure)      Hx DM  8. LUNG HISTORY: Do you have any history of lung disease?  (e.g., pulmonary embolus, asthma, emphysema)     na 9. PE RISK FACTORS: Do you have a history of blood clots? (or: recent major surgery, recent prolonged travel, bedridden)     na 10. OTHER SYMPTOMS: Do you have any other symptoms? (e.g., runny nose, wheezing, chest pain)       Runny nose, 1 side blocked at times and other side runny, dry cough, chills, sore throat, headache, body aches sinus pressure pain. Denies chest pain no difficulty breathing.  11. PREGNANCY: Is there any chance you are pregnant? When was your last menstrual period?       na 12. TRAVEL: Have you traveled out of the country in the last month? (e.g., travel history, exposures)       na  Protocols used: Cough - Acute Non-Productive-A-AH

## 2024-04-16 NOTE — Progress Notes (Signed)
 "   Virtual Visit via Video   I connected with patient on 04/16/2024 at 11:00 AM EST by a video enabled telemedicine application and verified that I am speaking with the correct person using two identifiers.  Location patient: Home Location provider: [place] [facility], Office Persons participating in the virtual visit: Patient, Provider, CMA   I discussed the limitations of evaluation and management by telemedicine and the availability of in person appointments. The patient expressed understanding and agreed to proceed.  Subjective:   HPI:   43 year old female presents today virtually with complaints of cough, congestion, scratchy sore throat, fever x 3 days and worsening.  She has been exposed to multiple people who have been positive for influenza.  Her daughter most recently with influenza B.  She did a home flu test yesterday that was negative.  Has been taking Mucinex  that helps some.  ROS:   See pertinent positives and negatives per HPI.  Patient Active Problem List   Diagnosis Date Noted   Sinus congestion 02/28/2024   Abnormal weight loss 09/26/2023   Advance care planning 02/25/2023   Diabetes mellitus without complication (HCC) 02/25/2023   Pulsatile tinnitus 02/25/2023   Dyshidrotic eczema 12/17/2018   Subclinical hypothyroidism 12/17/2018   Subungual hematoma of toe 12/17/2018   Family history of hemochromatosis 11/27/2018   Osteoarthritis of spine with radiculopathy, lumbar region 06/20/2016   Viral URI 08/20/2015   Chronic pelvic pain in female 11/30/2014   Endometriosis 11/30/2014   Hypertension 09/01/2014   Anxiety state 12/11/2013   Routine general medical examination at a health care facility 04/06/2011   Insomnia 12/20/2007   Allergic rhinitis 07/18/2007   Other hyperlipidemia 02/19/2007    Social History   Tobacco Use   Smoking status: Never    Passive exposure: Never   Smokeless tobacco: Never  Substance Use Topics   Alcohol use: Yes     Comment: rare   Current Medications[1]  Allergies[2]  Objective:   Temp 99.5 F (37.5 C) (Oral)   LMP 10/15/2013   Patient is well-developed, well-nourished in no acute distress.  Resting comfortably at home.  Head is normocephalic, atraumatic.  No labored breathing.  Speech is clear and coherent with logical content.  Patient is alert and oriented at baseline.    Assessment and Plan:   [name] was seen today for influenza, sore throat, generalized body aches, fever and cough.  Diagnoses and all orders for this visit:  Flu-like symptoms  Fever, unspecified fever cause  Other orders -     oseltamivir (TAMIFLU) 75 MG capsule; Take 1 capsule (75 mg total) by mouth 2 (two) times daily. -     chlorpheniramine-HYDROcodone  (TUSSIONEX) 10-8 MG/5ML; Take 5 mLs by mouth 2 (two) times daily.    Call the office if symptoms worsen or persist.  Recheck as scheduled and sooner as needed.  [name] B Janthony Holleman, FNP 04/16/2024  Time spent with the patient: 20 minutes, of which >50% was spent in obtaining information about symptoms, reviewing previous labs, evaluations, and treatments, counseling about condition (please see the discussed topics above), and developing a plan to further investigate it; had a number of questions which I addressed.      [1]  Current Outpatient Medications:    amLODipine  (NORVASC ) 10 MG tablet, TAKE 1 TABLET BY MOUTH DAILY, Disp: 90 tablet, Rfl: 0   atorvastatin (LIPITOR) 20 MG tablet, TAKE ONE TABLET BY MOUTH EVERY DAY, Disp: 90 tablet, Rfl: 1   chlorpheniramine-HYDROcodone  (TUSSIONEX) 10-8 MG/5ML, Take 5  mLs by mouth 2 (two) times daily., Disp: 120 mL, Rfl: 0   Continuous Glucose Sensor (DEXCOM G7 SENSOR) MISC, USE 1 SENSOR EVERY 10 DAYS AS DIRECTED, Disp: , Rfl:    dapagliflozin propanediol (FARXIGA) 10 MG TABS tablet, Take 10 mg by mouth daily., Disp: , Rfl:    estradiol  (ESTRACE ) 1 MG tablet, Take 1 tablet (1 mg total) by mouth daily., Disp: 90 tablet,  Rfl: 0   gabapentin  (NEURONTIN ) 300 MG capsule, restart gabapentin  300 mg daily for 2 days, increasing to 300 twice daily after 2 days, then can increase to 3 times daily, Disp: 40 capsule, Rfl: 1   gemfibrozil (LOPID) 600 MG tablet, Take by mouth., Disp: , Rfl:    insulin  glargine (LANTUS ) 100 UNIT/ML injection, Inject 28 Units into the skin daily., Disp: , Rfl:    losartan  (COZAAR ) 50 MG tablet, Take 1 tablet (50 mg total) by mouth daily., Disp: 90 tablet, Rfl: 3   metFORMIN (GLUCOPHAGE-XR) 750 MG 24 hr tablet, Take 750 mg by mouth 2 (two) times daily., Disp: , Rfl:    metoprolol  succinate (TOPROL -XL) 50 MG 24 hr tablet, Take 1 tablet (50 mg total) by mouth daily. Take with or immediately following a meal., Disp: 90 tablet, Rfl: 0   oseltamivir (TAMIFLU) 75 MG capsule, Take 1 capsule (75 mg total) by mouth 2 (two) times daily., Disp: 10 capsule, Rfl: 0   tirzepatide  (MOUNJARO ) 10 MG/0.5ML Pen, Inject 10 mg into the skin once a week., Disp: 2 mL, Rfl: 3   traMADol  (ULTRAM ) 50 MG tablet, Take 1-2 tablets (50-100 mg total) by mouth 3 (three) times daily as needed. (Patient not taking: Reported on 04/16/2024), Disp: 40 tablet, Rfl: 0 [2]  Allergies Allergen Reactions   Sulfamethoxazole-Trimethoprim Swelling and Rash   Azithromycin Rash   Cefprozil Rash and Other (See Comments)   Ceftin [Cefuroxime Axetil] Rash   Other Itching    CHG-wipes she had to use prior to surgery.  Ok to use CHG wash without problems   "

## 2024-04-20 NOTE — Telephone Encounter (Signed)
 Had VV 04/16/24.  Thanks.

## 2024-04-21 ENCOUNTER — Encounter: Payer: Self-pay | Admitting: Family Medicine

## 2024-04-22 ENCOUNTER — Telehealth: Payer: Self-pay

## 2024-04-22 NOTE — Telephone Encounter (Signed)
 Hycodan and tussionex both have hydrocodone  and should be similar    If tussionex isn't helping, then I don't expect hycodan to help.   If worse/not better, I think she needs to get rechecked.    Thanks.

## 2024-04-22 NOTE — Telephone Encounter (Signed)
 Copied from CRM [id number]. Topic: Clinical - Medication Question >> Apr 22, 2024 12:49 PM Rea BROCKS wrote: Reason for CRM: Patient was prescribed a cough medicine by NP Padonda. The medicine is not helping. Patient is asking if this other cough syrup named hycodane can be prescribed instead.    [phone] (M)

## 2024-04-25 ENCOUNTER — Ambulatory Visit: Admitting: Family Medicine

## 2024-04-27 ENCOUNTER — Telehealth: Payer: Self-pay | Admitting: Family Medicine

## 2024-04-27 ENCOUNTER — Other Ambulatory Visit: Payer: Self-pay | Admitting: Family Medicine

## 2024-04-27 NOTE — Telephone Encounter (Signed)
 Please triage patient about abdominal pain, oxycodone  use, current symptoms, in anticipation of upcoming office visit scheduled here.  Thanks.

## 2024-04-28 MED ORDER — TRAMADOL HCL 50 MG PO TABS: 50.0000 mg | ORAL_TABLET | Freq: Three times a day (TID) | ORAL | 0 refills | Status: DC | PRN

## 2024-04-28 NOTE — Telephone Encounter (Signed)
 Sent to [id number], [place] - [address]  Thanks.

## 2024-04-28 NOTE — Telephone Encounter (Signed)
 I spoke with pt; also see pt message on 04/21/24. Pt said presently having lower sharp abd pain that goes from rt to lt side of abd. Now pain level is 6. Pt said pain comes often. Has had this pain for long time but worse past 2 wks. Pt said feels like endometriosis type pain. Pt still does not have new OB GYN. Pt has no burning or pain or frequency; No N or V and no constipation or diarrhea. No fever noted. Pt has 0 oxycodone  left and is not taking oxycodone . Pt is presently alternating between tylenol  and ibuprofen  but that is not helping pain. Pt said gabapentin  makes her feel jittery. Pt requesting tramadol  to [facility]. Pt already has appt with Dr Cleatus on 05/01/24. UC & ED precautions given and pt voiced understanding. Sending note to Dr Cleatus and [facility] pool.

## 2024-05-01 ENCOUNTER — Ambulatory Visit: Admitting: Family Medicine

## 2024-05-02 ENCOUNTER — Other Ambulatory Visit: Payer: Self-pay | Admitting: *Deleted

## 2024-05-04 MED ORDER — GABAPENTIN 300 MG PO CAPS: 300.0000 mg | ORAL_CAPSULE | Freq: Three times a day (TID) | ORAL | 0 refills | Status: DC

## 2024-05-06 ENCOUNTER — Other Ambulatory Visit: Payer: Self-pay | Admitting: Family Medicine

## 2024-05-07 NOTE — Telephone Encounter (Signed)
 Requesting: Tramadol  Contract: No UDS:  Last Visit: 10/23/2023 Next Visit: 05/08/2024 Last Refill: 04/28/24  Please Advise

## 2024-05-08 ENCOUNTER — Ambulatory Visit: Admitting: Family Medicine

## 2024-05-08 ENCOUNTER — Encounter: Payer: Self-pay | Admitting: Family Medicine

## 2024-05-08 VITALS — BP 110/80 | HR 90 | Temp 98.6°F | Ht 65.0 in | Wt 126.1 lb

## 2024-05-08 DIAGNOSIS — R102 Pelvic and perineal pain unspecified side: Secondary | ICD-10-CM

## 2024-05-08 DIAGNOSIS — Z23 Encounter for immunization: Secondary | ICD-10-CM

## 2024-05-08 DIAGNOSIS — G8929 Other chronic pain: Secondary | ICD-10-CM | POA: Diagnosis not present

## 2024-05-08 MED ORDER — AMLODIPINE BESYLATE 10 MG PO TABS: 10.0000 mg | ORAL_TABLET | Freq: Every day | ORAL | 1 refills | Status: AC

## 2024-05-08 MED ORDER — TRAMADOL HCL 50 MG PO TABS: 50.0000 mg | ORAL_TABLET | Freq: Two times a day (BID) | ORAL | 1 refills | Status: AC | PRN

## 2024-05-08 NOTE — Patient Instructions (Signed)
 Please check with endocrine about lowering mounjaro .  Let me know if you see any change with the lower dose.  If not, then either would need to continue to lower dose/see pain clinic. Take care.  Glad to see you.

## 2024-05-08 NOTE — Progress Notes (Unsigned)
 She is still on amlodipine , BP controlled.  Needed refill, sent.   Still on mounjaro  10mg  weekly. She has endocrine f/u pending.   S/p cholecystectomy.  D/w pt about hospital course.  That was clearly different from her baseline abd pain.  No diarrhea in the meantime except after eating a new type of pancake mix.    Prev chronic pain was/is R>L lower abd pain.   She saw GI and GYN prev.  She is going to change to estrogen patch instead of oral treatment.    Per GI:  Chronic constipation - exacerbated by GLP-1 and pain medications  Chronic RLQ abdominal pain/Chronic pelvic pain - possible IBS-C   Delayed gastric emptying is likely side effect from Mounjaro  and pain medications.  - No indications for EGD at this time - Recommend bowel purge with Miralax /Gatorade today. Instructions provided.  - Advised her to start Miralax  17-34 grams daily. Titrate to effect for maintenance therapy.  - If Miralax  not helpful, consider Amitiza 24 mcg PO BID   She hasn't been able to see Dr. Fredirick.   Gabapentin  occ helps her pain but she can feel jittery several hours after taking med.  Tramadol  helps more.  No ADE on med. Cautions d/w pt.    Meds, vitals, and allergies reviewed.   ROS: Per HPI unless specifically indicated in ROS section

## 2024-05-09 NOTE — Assessment & Plan Note (Addendum)
 Unclear if this is exacerbated by mounjaro .  D/w pt about options.  I asked her to check with endocrine about lowering mounjaro .  She can let me know if she sees any change with the lower dose.  If not, then either would need to continue to lower dose/refer to pain clinic. Okay for outpatient f/u.   Continue tramadol  prn for now. Not using other opiates now.  32 minutes were devoted to patient care in this encounter (this includes time spent reviewing the patient's file/history, interviewing and examining the patient, counseling/reviewing plan with patient).

## 2024-05-15 ENCOUNTER — Ambulatory Visit: Admitting: Family Medicine

## 2024-05-17 ENCOUNTER — Other Ambulatory Visit: Payer: Self-pay | Admitting: Family Medicine

## 2024-05-19 ENCOUNTER — Telehealth

## 2024-05-19 VITALS — Temp 98.6°F | Ht 65.0 in | Wt 126.0 lb

## 2024-05-19 DIAGNOSIS — B9789 Other viral agents as the cause of diseases classified elsewhere: Secondary | ICD-10-CM | POA: Diagnosis not present

## 2024-05-19 DIAGNOSIS — J019 Acute sinusitis, unspecified: Secondary | ICD-10-CM | POA: Diagnosis not present

## 2024-05-19 MED ORDER — HYDROCOD POLI-CHLORPHE POLI ER 10-8 MG/5ML PO SUER: 5.0000 mL | Freq: Every evening | ORAL | 0 refills | Status: DC

## 2024-05-19 MED ORDER — IBUPROFEN 800 MG PO TABS: 800.0000 mg | ORAL_TABLET | Freq: Three times a day (TID) | ORAL | 0 refills | Status: AC | PRN

## 2024-05-19 MED ORDER — HYDROCOD POLI-CHLORPHE POLI ER 10-8 MG/5ML PO SUER: 5.0000 mL | Freq: Every evening | ORAL | 0 refills | Status: AC | PRN

## 2024-05-19 MED ORDER — OXYMETAZOLINE HCL 0.05 % NA SOLN: 1.0000 | Freq: Two times a day (BID) | NASAL | 0 refills | Status: AC

## 2024-05-19 MED ORDER — DEXTROMETHORPHAN HBR 15 MG/5ML PO SYRP: 10.0000 mL | ORAL_SOLUTION | Freq: Four times a day (QID) | ORAL | 0 refills | Status: AC | PRN

## 2024-05-19 NOTE — Patient Instructions (Addendum)
 Thank you for visiting  Healthcare today! Here's what we talked about: - START Tussionex for cough at night, do not take Tramdol for these next several days while you are on it. Take dextromethorphan  during the day every 6 hrs, Afrin for nasal congestion and ibuprofen  as needed for your headache.  Make sure to take ibuprofen  with food. - Return to clinic if symptoms unimproved after 7 days

## 2024-05-19 NOTE — Progress Notes (Signed)
 "  Ph: [phone] Fax: [phone]   Patient ID: [name], female    DOB: November 11, 1980, 44 y.o.   MRN: [id number]  Virtual visit completed through MyChart, a video enabled telemedicine application. Reviewed limitations, risks, security and privacy concerns of performing a virtual visit and the availability of in person appointments. I also reviewed that there may be a patient responsible charge related to this service. The patient agreed to proceed.  The patient gave informed consent to the use of [employer] AI technology to record the contents of the encounter as documented below.  Patient location: home Provider location: [facility], office Persons participating in this virtual visit: patient, provider Reuben MARLA Burkes, MD   If any vitals were documented, they were collected by patient at home unless specified below.    Temp 98.6 F (37 C) (Temporal)   Ht 5' 5 (1.651 m)   Wt 126 lb (57.2 kg)   LMP 10/15/2013   BMI 20.97 kg/m    CC: URI sx  Subjective:   HPI: [name] is a 44 y.o. female presenting on 05/19/2024 for Respiratory Illness (Started 05-15-24 with nasal congestion, sinus pain and pressure around cheeks and forehead, post nasal drip, cough, headache. Home covid and flu tests were negative. Has been taking Mucinex  and Tessalon  Perles she had left over.)   Discussed the use of AI scribe software for clinical note transcription with the patient, who gave verbal consent to proceed.  History of Present Illness Sx started 1/29 Bothered most by sinus pressure, nose stuffed up, nasal drainage, frontal headache, cough keeps her up at night  Works at [employer], sx affecting work No fever or chills, sore throat, body aches Used mucinex  and tessalon , not really helping          Relevant past medical, surgical, family and social history reviewed and updated as indicated. Interim medical history since our last visit reviewed. Allergies and medications  reviewed and updated. Outpatient Medications Prior to Visit  Medication Sig Dispense Refill   amLODipine  (NORVASC ) 10 MG tablet Take 1 tablet (10 mg total) by mouth daily. 90 tablet 1   atorvastatin (LIPITOR) 20 MG tablet TAKE ONE TABLET BY MOUTH EVERY DAY 90 tablet 1   Continuous Glucose Sensor (DEXCOM G7 SENSOR) MISC USE 1 SENSOR EVERY 10 DAYS AS DIRECTED     dapagliflozin propanediol (FARXIGA) 10 MG TABS tablet Take 10 mg by mouth daily.     estradiol  (ESTRACE ) 1 MG tablet Take 1 tablet (1 mg total) by mouth daily. 90 tablet 0   gabapentin  (NEURONTIN ) 300 MG capsule Take 1 capsule (300 mg total) by mouth 3 (three) times daily. (Patient taking differently: Take 300 mg by mouth 3 (three) times daily as needed.) 40 capsule 0   gemfibrozil (LOPID) 600 MG tablet Take by mouth.     insulin  glargine (LANTUS ) 100 UNIT/ML injection Inject 24 Units into the skin daily.     losartan  (COZAAR ) 50 MG tablet Take 1 tablet (50 mg total) by mouth daily. 90 tablet 3   metFORMIN (GLUCOPHAGE-XR) 750 MG 24 hr tablet Take 750 mg by mouth 2 (two) times daily.     metoprolol  succinate (TOPROL -XL) 50 MG 24 hr tablet Take 1 tablet (50 mg total) by mouth daily. Take with or immediately following a meal. 90 tablet 0   tirzepatide  (MOUNJARO ) 10 MG/0.5ML Pen Inject 10 mg into the skin once a week. 2 mL 3   traMADol  (ULTRAM ) 50 MG tablet  Take 1-2 tablets (50-100 mg total) by mouth every 12 (twelve) hours as needed. 60 tablet 1   No facility-administered medications prior to visit.     Per HPI unless specifically indicated in ROS section below Review of Systems  All other systems reviewed and are negative.   Objective:   Temp 98.6 F (37 C) (Temporal)   Ht 5' 5 (1.651 m)   Wt 126 lb (57.2 kg)   LMP 10/15/2013   BMI 20.97 kg/m   Wt Readings from Last 3 Encounters:  05/19/24 126 lb (57.2 kg)  05/08/24 126 lb 2 oz (57.2 kg)  02/22/24 133 lb (60.3 kg)       Physical exam: Gen: alert, NAD, not ill  appearing Pulm: speaks in complete sentences without increased work of breathing Psych: normal mood, normal thought content        Results for orders placed or performed in visit on 02/12/24  POCT rapid strep A   Collection Time: 02/12/24 10:47 AM  Result Value Ref Range   Rapid Strep A Screen Negative Negative  POCT Influenza A/B   Collection Time: 02/12/24 10:47 AM  Result Value Ref Range   Influenza A, POC Negative Negative   Influenza B, POC Negative Negative  POC COVID-19   Collection Time: 02/12/24 10:47 AM  Result Value Ref Range   SARS Coronavirus 2 Ag Negative Negative   *Note: Due to a large number of results and/or encounters for the requested time period, some results have not been displayed. A complete set of results can be found in Results Review.     Assessment & Plan:   Assessment & Plan Acute viral sinusitis No high fevers or other indications of bacterial infection, clinical course appears to be viral, treating conservatively as below.  Counseled to return to clinic if symptoms unimproved in 7 days. She will refrain from using her tramadol  while she is using Tussionex syrup for cough at night.  Dextromethorphan  syrup for the day.  Orders:   oxymetazoline  (AFRIN 12 HOUR) 0.05 % nasal spray; Place 1 spray into both nostrils 2 (two) times daily for 3 days. USE ONLY FOR 3 DAYS   chlorpheniramine-HYDROcodone  (TUSSIONEX) 10-8 MG/5ML; Take 5 mLs by mouth at bedtime for 7 days.   dextromethorphan  15 MG/5ML syrup; Take 10 mLs (30 mg total) by mouth 4 (four) times daily as needed for cough.   ibuprofen  (ADVIL ) 800 MG tablet; Take 1 tablet (800 mg total) by mouth every 8 (eight) hours as needed for up to 10 days.    I discussed the assessment and treatment plan with the patient. The patient was provided an opportunity to ask questions and all were answered. The patient agreed with the plan and demonstrated an understanding of the instructions. The patient was advised to  call back or seek an in-person evaluation if the symptoms worsen or if the condition fails to improve as anticipated.  No follow-ups on file.  Leshonda Galambos K Yasmen Cortner, MD  05/19/24     Contains text generated by [employer].    "

## 2024-05-19 NOTE — Telephone Encounter (Signed)
 Sent. Thanks.
